# Patient Record
Sex: Female | Born: 1940
Health system: Southern US, Community
[De-identification: ages and names within clinical notes are randomized; demographics above are authoritative.]

## PROBLEM LIST (undated history)

## (undated) DIAGNOSIS — I10 Essential (primary) hypertension: Secondary | ICD-10-CM

## (undated) DIAGNOSIS — R011 Cardiac murmur, unspecified: Secondary | ICD-10-CM

## (undated) DIAGNOSIS — E785 Hyperlipidemia, unspecified: Secondary | ICD-10-CM

## (undated) DIAGNOSIS — I350 Nonrheumatic aortic (valve) stenosis: Secondary | ICD-10-CM

## (undated) DIAGNOSIS — Z972 Presence of dental prosthetic device (complete) (partial): Secondary | ICD-10-CM

## (undated) DIAGNOSIS — T7840XA Allergy, unspecified, initial encounter: Secondary | ICD-10-CM

## (undated) DIAGNOSIS — E079 Disorder of thyroid, unspecified: Secondary | ICD-10-CM

## (undated) DIAGNOSIS — I4891 Unspecified atrial fibrillation: Secondary | ICD-10-CM

## (undated) DIAGNOSIS — L0390 Cellulitis, unspecified: Secondary | ICD-10-CM

## (undated) DIAGNOSIS — E039 Hypothyroidism, unspecified: Secondary | ICD-10-CM

## (undated) HISTORY — DX: Cellulitis, unspecified: L03.90

## (undated) HISTORY — PX: CARDIAC CATHETERIZATION: SHX172

## (undated) HISTORY — DX: Essential (primary) hypertension: I10

## (undated) HISTORY — PX: APPENDECTOMY: SHX54

## (undated) HISTORY — DX: Allergy, unspecified, initial encounter: T78.40XA

## (undated) HISTORY — DX: Hyperlipidemia, unspecified: E78.5

## (undated) HISTORY — PX: FOOT SURGERY: SHX648

## (undated) HISTORY — DX: Disorder of thyroid, unspecified: E07.9

## (undated) HISTORY — PX: TONSILLECTOMY: SHX5217

---
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
## 1997-05-14 HISTORY — PX: BILATERAL CARPAL TUNNEL RELEASE: SHX6508

## 2007-02-19 DIAGNOSIS — E039 Hypothyroidism, unspecified: Secondary | ICD-10-CM | POA: Insufficient documentation

## 2007-02-19 DIAGNOSIS — E785 Hyperlipidemia, unspecified: Secondary | ICD-10-CM | POA: Insufficient documentation

## 2007-02-23 DIAGNOSIS — N318 Other neuromuscular dysfunction of bladder: Secondary | ICD-10-CM | POA: Insufficient documentation

## 2007-02-23 DIAGNOSIS — I10 Essential (primary) hypertension: Secondary | ICD-10-CM | POA: Insufficient documentation

## 2007-02-23 DIAGNOSIS — N951 Menopausal and female climacteric states: Secondary | ICD-10-CM | POA: Insufficient documentation

## 2007-02-23 DIAGNOSIS — L719 Rosacea, unspecified: Secondary | ICD-10-CM | POA: Insufficient documentation

## 2007-02-23 DIAGNOSIS — I340 Nonrheumatic mitral (valve) insufficiency: Secondary | ICD-10-CM | POA: Insufficient documentation

## 2007-02-23 DIAGNOSIS — Z86010 Personal history of colonic polyps: Secondary | ICD-10-CM | POA: Insufficient documentation

## 2007-07-06 DIAGNOSIS — Z8249 Family history of ischemic heart disease and other diseases of the circulatory system: Secondary | ICD-10-CM | POA: Insufficient documentation

## 2007-07-22 ENCOUNTER — Ambulatory Visit: Payer: Self-pay | Admitting: Family Medicine

## 2008-05-04 ENCOUNTER — Ambulatory Visit: Payer: Self-pay | Admitting: Gastroenterology

## 2008-10-19 DIAGNOSIS — N399 Disorder of urinary system, unspecified: Secondary | ICD-10-CM | POA: Insufficient documentation

## 2008-10-26 ENCOUNTER — Ambulatory Visit: Payer: Self-pay | Admitting: Family Medicine

## 2009-04-20 DIAGNOSIS — I6529 Occlusion and stenosis of unspecified carotid artery: Secondary | ICD-10-CM | POA: Insufficient documentation

## 2009-04-25 ENCOUNTER — Ambulatory Visit: Payer: Self-pay | Admitting: Family Medicine

## 2010-06-20 LAB — HM DEXA SCAN: HM Dexa Scan: NORMAL

## 2010-08-01 ENCOUNTER — Ambulatory Visit: Payer: Self-pay | Admitting: Family Medicine

## 2011-10-22 LAB — HM PAP SMEAR: HM Pap smear: NEGATIVE

## 2011-10-30 ENCOUNTER — Ambulatory Visit: Payer: Self-pay | Admitting: Family Medicine

## 2012-12-29 ENCOUNTER — Ambulatory Visit: Payer: Self-pay | Admitting: Family Medicine

## 2013-09-30 DIAGNOSIS — M1710 Unilateral primary osteoarthritis, unspecified knee: Secondary | ICD-10-CM | POA: Insufficient documentation

## 2013-09-30 DIAGNOSIS — M179 Osteoarthritis of knee, unspecified: Secondary | ICD-10-CM | POA: Insufficient documentation

## 2013-10-21 DIAGNOSIS — M7050 Other bursitis of knee, unspecified knee: Secondary | ICD-10-CM | POA: Insufficient documentation

## 2014-04-20 LAB — CBC AND DIFFERENTIAL
HCT: 40 % (ref 36–46)
Hemoglobin: 13.2 g/dL (ref 12.0–16.0)
Platelets: 255 10*3/uL (ref 150–399)
WBC: 7.5 10^3/mL

## 2014-05-18 DIAGNOSIS — M9905 Segmental and somatic dysfunction of pelvic region: Secondary | ICD-10-CM | POA: Diagnosis not present

## 2014-05-18 DIAGNOSIS — M955 Acquired deformity of pelvis: Secondary | ICD-10-CM | POA: Diagnosis not present

## 2014-05-18 DIAGNOSIS — M62830 Muscle spasm of back: Secondary | ICD-10-CM | POA: Diagnosis not present

## 2014-05-18 DIAGNOSIS — M9904 Segmental and somatic dysfunction of sacral region: Secondary | ICD-10-CM | POA: Diagnosis not present

## 2014-06-01 ENCOUNTER — Ambulatory Visit: Payer: Self-pay | Admitting: Gastroenterology

## 2014-06-01 DIAGNOSIS — D122 Benign neoplasm of ascending colon: Secondary | ICD-10-CM | POA: Diagnosis not present

## 2014-06-01 DIAGNOSIS — K5730 Diverticulosis of large intestine without perforation or abscess without bleeding: Secondary | ICD-10-CM | POA: Diagnosis not present

## 2014-06-01 DIAGNOSIS — Z1211 Encounter for screening for malignant neoplasm of colon: Secondary | ICD-10-CM | POA: Diagnosis not present

## 2014-06-01 DIAGNOSIS — Z86010 Personal history of colonic polyps: Secondary | ICD-10-CM | POA: Diagnosis not present

## 2014-06-01 DIAGNOSIS — Z7982 Long term (current) use of aspirin: Secondary | ICD-10-CM | POA: Diagnosis not present

## 2014-06-01 DIAGNOSIS — K648 Other hemorrhoids: Secondary | ICD-10-CM | POA: Diagnosis not present

## 2014-06-01 DIAGNOSIS — E669 Obesity, unspecified: Secondary | ICD-10-CM | POA: Diagnosis not present

## 2014-06-01 DIAGNOSIS — Z880 Allergy status to penicillin: Secondary | ICD-10-CM | POA: Diagnosis not present

## 2014-06-01 DIAGNOSIS — I10 Essential (primary) hypertension: Secondary | ICD-10-CM | POA: Diagnosis not present

## 2014-06-01 DIAGNOSIS — E079 Disorder of thyroid, unspecified: Secondary | ICD-10-CM | POA: Diagnosis not present

## 2014-06-01 LAB — HM COLONOSCOPY

## 2014-06-04 ENCOUNTER — Emergency Department: Payer: Self-pay | Admitting: Internal Medicine

## 2014-06-04 DIAGNOSIS — I10 Essential (primary) hypertension: Secondary | ICD-10-CM | POA: Diagnosis not present

## 2014-06-04 DIAGNOSIS — Z880 Allergy status to penicillin: Secondary | ICD-10-CM | POA: Diagnosis not present

## 2014-06-04 DIAGNOSIS — R918 Other nonspecific abnormal finding of lung field: Secondary | ICD-10-CM | POA: Diagnosis not present

## 2014-06-04 DIAGNOSIS — Z79899 Other long term (current) drug therapy: Secondary | ICD-10-CM | POA: Diagnosis not present

## 2014-06-04 DIAGNOSIS — H9202 Otalgia, left ear: Secondary | ICD-10-CM | POA: Diagnosis not present

## 2014-06-04 DIAGNOSIS — Z7982 Long term (current) use of aspirin: Secondary | ICD-10-CM | POA: Diagnosis not present

## 2014-06-04 DIAGNOSIS — F419 Anxiety disorder, unspecified: Secondary | ICD-10-CM | POA: Diagnosis not present

## 2014-06-04 LAB — URINALYSIS, COMPLETE
Bacteria: NONE SEEN
Bilirubin,UR: NEGATIVE
Glucose,UR: NEGATIVE mg/dL (ref 0–75)
KETONE: NEGATIVE
Leukocyte Esterase: NEGATIVE
NITRITE: NEGATIVE
PH: 5 (ref 4.5–8.0)
PROTEIN: NEGATIVE
Specific Gravity: 1.01 (ref 1.003–1.030)
Squamous Epithelial: 3
WBC UR: 1 /HPF (ref 0–5)

## 2014-06-04 LAB — COMPREHENSIVE METABOLIC PANEL
ANION GAP: 7 (ref 7–16)
AST: 36 U/L (ref 15–37)
Albumin: 3.5 g/dL (ref 3.4–5.0)
Alkaline Phosphatase: 67 U/L
BUN: 16 mg/dL (ref 7–18)
Bilirubin,Total: 0.5 mg/dL (ref 0.2–1.0)
CALCIUM: 9.2 mg/dL (ref 8.5–10.1)
CHLORIDE: 109 mmol/L — AB (ref 98–107)
CO2: 28 mmol/L (ref 21–32)
Creatinine: 1.18 mg/dL (ref 0.60–1.30)
EGFR (Non-African Amer.): 48 — ABNORMAL LOW
GFR CALC AF AMER: 58 — AB
Glucose: 106 mg/dL — ABNORMAL HIGH (ref 65–99)
Osmolality: 288 (ref 275–301)
Potassium: 4 mmol/L (ref 3.5–5.1)
SGPT (ALT): 38 U/L
Sodium: 144 mmol/L (ref 136–145)
Total Protein: 6.8 g/dL (ref 6.4–8.2)

## 2014-06-04 LAB — CBC
HCT: 40.8 % (ref 35.0–47.0)
HGB: 13.4 g/dL (ref 12.0–16.0)
MCH: 31.8 pg (ref 26.0–34.0)
MCHC: 32.9 g/dL (ref 32.0–36.0)
MCV: 97 fL (ref 80–100)
Platelet: 217 10*3/uL (ref 150–440)
RBC: 4.22 10*6/uL (ref 3.80–5.20)
RDW: 13.6 % (ref 11.5–14.5)
WBC: 7.8 10*3/uL (ref 3.6–11.0)

## 2014-06-07 DIAGNOSIS — I10 Essential (primary) hypertension: Secondary | ICD-10-CM | POA: Diagnosis not present

## 2014-06-07 DIAGNOSIS — Z1389 Encounter for screening for other disorder: Secondary | ICD-10-CM | POA: Diagnosis not present

## 2014-06-08 ENCOUNTER — Ambulatory Visit: Payer: Self-pay | Admitting: Family Medicine

## 2014-06-08 DIAGNOSIS — Z1231 Encounter for screening mammogram for malignant neoplasm of breast: Secondary | ICD-10-CM | POA: Diagnosis not present

## 2014-06-08 LAB — HM MAMMOGRAPHY

## 2014-06-10 DIAGNOSIS — M62830 Muscle spasm of back: Secondary | ICD-10-CM | POA: Diagnosis not present

## 2014-06-10 DIAGNOSIS — M955 Acquired deformity of pelvis: Secondary | ICD-10-CM | POA: Diagnosis not present

## 2014-06-10 DIAGNOSIS — M9905 Segmental and somatic dysfunction of pelvic region: Secondary | ICD-10-CM | POA: Diagnosis not present

## 2014-06-10 DIAGNOSIS — M9904 Segmental and somatic dysfunction of sacral region: Secondary | ICD-10-CM | POA: Diagnosis not present

## 2014-06-11 DIAGNOSIS — M62830 Muscle spasm of back: Secondary | ICD-10-CM | POA: Diagnosis not present

## 2014-06-11 DIAGNOSIS — M9905 Segmental and somatic dysfunction of pelvic region: Secondary | ICD-10-CM | POA: Diagnosis not present

## 2014-06-11 DIAGNOSIS — M955 Acquired deformity of pelvis: Secondary | ICD-10-CM | POA: Diagnosis not present

## 2014-06-11 DIAGNOSIS — M9904 Segmental and somatic dysfunction of sacral region: Secondary | ICD-10-CM | POA: Diagnosis not present

## 2014-06-14 DIAGNOSIS — M62830 Muscle spasm of back: Secondary | ICD-10-CM | POA: Diagnosis not present

## 2014-06-14 DIAGNOSIS — M9904 Segmental and somatic dysfunction of sacral region: Secondary | ICD-10-CM | POA: Diagnosis not present

## 2014-06-14 DIAGNOSIS — M955 Acquired deformity of pelvis: Secondary | ICD-10-CM | POA: Diagnosis not present

## 2014-06-14 DIAGNOSIS — M9905 Segmental and somatic dysfunction of pelvic region: Secondary | ICD-10-CM | POA: Diagnosis not present

## 2014-08-04 DIAGNOSIS — M1712 Unilateral primary osteoarthritis, left knee: Secondary | ICD-10-CM | POA: Diagnosis not present

## 2014-09-06 DIAGNOSIS — M62830 Muscle spasm of back: Secondary | ICD-10-CM | POA: Diagnosis not present

## 2014-09-06 DIAGNOSIS — M9905 Segmental and somatic dysfunction of pelvic region: Secondary | ICD-10-CM | POA: Diagnosis not present

## 2014-09-06 DIAGNOSIS — M9904 Segmental and somatic dysfunction of sacral region: Secondary | ICD-10-CM | POA: Diagnosis not present

## 2014-09-06 DIAGNOSIS — M955 Acquired deformity of pelvis: Secondary | ICD-10-CM | POA: Diagnosis not present

## 2014-09-06 LAB — SURGICAL PATHOLOGY

## 2014-12-01 DIAGNOSIS — M545 Low back pain, unspecified: Secondary | ICD-10-CM | POA: Insufficient documentation

## 2014-12-01 DIAGNOSIS — R32 Unspecified urinary incontinence: Secondary | ICD-10-CM | POA: Insufficient documentation

## 2014-12-01 DIAGNOSIS — R87619 Unspecified abnormal cytological findings in specimens from cervix uteri: Secondary | ICD-10-CM | POA: Insufficient documentation

## 2014-12-01 DIAGNOSIS — K1120 Sialoadenitis, unspecified: Secondary | ICD-10-CM | POA: Insufficient documentation

## 2014-12-01 DIAGNOSIS — R591 Generalized enlarged lymph nodes: Secondary | ICD-10-CM | POA: Insufficient documentation

## 2014-12-01 DIAGNOSIS — Z8679 Personal history of other diseases of the circulatory system: Secondary | ICD-10-CM | POA: Insufficient documentation

## 2014-12-01 DIAGNOSIS — R87620 Atypical squamous cells of undetermined significance on cytologic smear of vagina (ASC-US): Secondary | ICD-10-CM

## 2014-12-01 DIAGNOSIS — R599 Enlarged lymph nodes, unspecified: Secondary | ICD-10-CM | POA: Insufficient documentation

## 2014-12-01 HISTORY — DX: Atypical squamous cells of undetermined significance on cytologic smear of vagina (ASC-US): R87.620

## 2014-12-02 ENCOUNTER — Encounter: Payer: Self-pay | Admitting: Family Medicine

## 2014-12-02 ENCOUNTER — Ambulatory Visit (INDEPENDENT_AMBULATORY_CARE_PROVIDER_SITE_OTHER): Payer: Medicare Other | Admitting: Family Medicine

## 2014-12-02 VITALS — BP 138/68 | HR 60 | Temp 97.7°F | Resp 16 | Ht 66.0 in | Wt 276.0 lb

## 2014-12-02 DIAGNOSIS — R002 Palpitations: Secondary | ICD-10-CM | POA: Diagnosis not present

## 2014-12-02 DIAGNOSIS — R5383 Other fatigue: Secondary | ICD-10-CM

## 2014-12-02 DIAGNOSIS — R0602 Shortness of breath: Secondary | ICD-10-CM | POA: Diagnosis not present

## 2014-12-02 NOTE — Progress Notes (Signed)
   Subjective:    Patient ID: Mary Kennedy, female    DOB: 06-19-1940, 74 y.o.   MRN: [id number]  Palpitations  This is a new problem. The current episode started more than 1 month ago (About six months ago, but recently worsening. ). The problem has been gradually worsening. The symptoms are aggravated by unknown. Associated symptoms include nausea, shortness of breath and weakness. Pertinent negatives include no chest pain, coughing, diaphoresis, dizziness, fever, numbness or vomiting. Treatments tried: Rest. The treatment provided moderate relief.  Shortness of Breath The current episode started more than 1 month ago (Started about six months ago. ). The problem has been gradually worsening. Associated symptoms include abdominal pain ("feels uncomfortable" sometimes. ), headaches and neck pain. Pertinent negatives include no chest pain, ear pain, fever, leg swelling, sputum production, vomiting or wheezing. She has tried rest for the symptoms.   Has not felt well since January. Worsening in the heat. Not consistent. Just SOB. Start sweating.  Gets breathless.  Has to rest. Fatigued. Just can not stay up. Off and on. Not consistent. More often.    Review of Systems  Constitutional: Positive for activity change, appetite change and fatigue. Negative for fever, chills, diaphoresis and unexpected weight change.  HENT: Positive for ear discharge and sneezing. Negative for ear pain.   Eyes: Positive for visual disturbance ("Bright circular spot" in her left eye especially at night. ).  Respiratory: Positive for apnea (?) and shortness of breath. Negative for cough, sputum production, choking, chest tightness and wheezing.   Cardiovascular: Positive for palpitations. Negative for chest pain and leg swelling.  Gastrointestinal: Positive for nausea, abdominal pain ("feels uncomfortable" sometimes. ) and constipation (Chronic issue since her colonoscopy in Jan. ). Negative for vomiting, diarrhea,  blood in stool, abdominal distention, anal bleeding and rectal pain.  Musculoskeletal: Positive for arthralgias and neck pain.  Neurological: Positive for weakness, light-headedness and headaches. Negative for dizziness, tremors, seizures, syncope, facial asymmetry, speech difficulty and numbness.  Psychiatric/Behavioral: Positive for sleep disturbance.       Objective:   Physical Exam  Constitutional: She is oriented to person, place, and time. She appears well-developed and well-nourished.  Cardiovascular: Normal rate and regular rhythm.   Pulmonary/Chest: Effort normal and breath sounds normal.  Neurological: She is alert and oriented to person, place, and time.  Psychiatric: She has a normal mood and affect. Her behavior is normal. Judgment and thought content normal.   BP 138/68 mmHg  Pulse 60  Temp(Src) 97.7 F (36.5 C) (Oral)  Resp 16  Ht 5\' 6"  (1.676 m)  Wt 276 lb (125.193 kg)  BMI 44.57 kg/m2       Assessment & Plan:   1. Heart palpitations EKG unchanged today. Will refer to cardiology. ER if has worse event that does not resolve with rest or develops any other symptoms.  - EKG 12-Lead - CBC with Differential/Platelet - Ambulatory referral to Cardiology  2. Shortness of breath Will check labs. Oxygen normal today. Will investigate pulmonary further if cardiac work up norma.  - CBC with Differential/Platelet - B Nat Peptide - Ambulatory referral to Cardiology  3. Other fatigue Will check labs. Consider sleep apnea if rest of work up normal.  - Comprehensive metabolic panel - TSH  Margarita Rana, MD

## 2014-12-03 ENCOUNTER — Telehealth: Payer: Self-pay

## 2014-12-03 LAB — CBC WITH DIFFERENTIAL/PLATELET
BASOS: 1 %
Basophils Absolute: 0.1 10*3/uL (ref 0.0–0.2)
EOS (ABSOLUTE): 0.4 10*3/uL (ref 0.0–0.4)
EOS: 6 %
Hematocrit: 43.2 % (ref 34.0–46.6)
Hemoglobin: 14 g/dL (ref 11.1–15.9)
IMMATURE GRANULOCYTES: 0 %
Immature Grans (Abs): 0 10*3/uL (ref 0.0–0.1)
LYMPHS: 45 %
Lymphocytes Absolute: 3.1 10*3/uL (ref 0.7–3.1)
MCH: 31 pg (ref 26.6–33.0)
MCHC: 32.4 g/dL (ref 31.5–35.7)
MCV: 96 fL (ref 79–97)
Monocytes Absolute: 0.7 10*3/uL (ref 0.1–0.9)
Monocytes: 9 %
Neutrophils Absolute: 2.7 10*3/uL (ref 1.4–7.0)
Neutrophils: 39 %
PLATELETS: 260 10*3/uL (ref 150–379)
RBC: 4.52 x10E6/uL (ref 3.77–5.28)
RDW: 13.9 % (ref 12.3–15.4)
WBC: 7 10*3/uL (ref 3.4–10.8)

## 2014-12-03 LAB — COMPREHENSIVE METABOLIC PANEL
A/G RATIO: 1.5 (ref 1.1–2.5)
ALT: 37 IU/L — ABNORMAL HIGH (ref 0–32)
AST: 34 IU/L (ref 0–40)
Albumin: 4.2 g/dL (ref 3.5–4.8)
Alkaline Phosphatase: 64 IU/L (ref 39–117)
BILIRUBIN TOTAL: 0.5 mg/dL (ref 0.0–1.2)
BUN/Creatinine Ratio: 21 (ref 11–26)
BUN: 21 mg/dL (ref 8–27)
CHLORIDE: 100 mmol/L (ref 97–108)
CO2: 26 mmol/L (ref 18–29)
Calcium: 9.8 mg/dL (ref 8.7–10.3)
Creatinine, Ser: 1.01 mg/dL — ABNORMAL HIGH (ref 0.57–1.00)
GFR calc non Af Amer: 55 mL/min/{1.73_m2} — ABNORMAL LOW (ref >59)
GFR, EST AFRICAN AMERICAN: 64 mL/min/{1.73_m2} (ref >59)
Globulin, Total: 2.8 g/dL (ref 1.5–4.5)
Glucose: 92 mg/dL (ref 65–99)
POTASSIUM: 4.8 mmol/L (ref 3.5–5.2)
SODIUM: 142 mmol/L (ref 134–144)
Total Protein: 7 g/dL (ref 6.0–8.5)

## 2014-12-03 LAB — BRAIN NATRIURETIC PEPTIDE: BNP: 74.2 pg/mL (ref 0.0–100.0)

## 2014-12-03 LAB — TSH: TSH: 2.14 u[IU]/mL (ref 0.450–4.500)

## 2014-12-03 NOTE — Telephone Encounter (Signed)
-----   Message from Margarita Rana, MD sent at 12/03/2014  1:57 PM EDT ----- Labs normal.  Make sure to referral to Cardiology. Thanks.

## 2014-12-03 NOTE — Telephone Encounter (Signed)
LMTCB 12/03/2014  Thanks,   -Mickel Baas

## 2014-12-06 NOTE — Telephone Encounter (Signed)
Pt advised, she says she has an appointment this afternoon with Cardiology.   Thanks,   -Mickel Baas

## 2015-03-04 DIAGNOSIS — Z23 Encounter for immunization: Secondary | ICD-10-CM | POA: Diagnosis not present

## 2015-03-14 DIAGNOSIS — M9905 Segmental and somatic dysfunction of pelvic region: Secondary | ICD-10-CM | POA: Diagnosis not present

## 2015-03-14 DIAGNOSIS — M955 Acquired deformity of pelvis: Secondary | ICD-10-CM | POA: Diagnosis not present

## 2015-03-14 DIAGNOSIS — M62830 Muscle spasm of back: Secondary | ICD-10-CM | POA: Diagnosis not present

## 2015-03-14 DIAGNOSIS — M9904 Segmental and somatic dysfunction of sacral region: Secondary | ICD-10-CM | POA: Diagnosis not present

## 2015-03-17 ENCOUNTER — Other Ambulatory Visit: Payer: Self-pay

## 2015-03-17 DIAGNOSIS — E7800 Pure hypercholesterolemia, unspecified: Secondary | ICD-10-CM

## 2015-03-17 DIAGNOSIS — E039 Hypothyroidism, unspecified: Secondary | ICD-10-CM

## 2015-03-17 MED ORDER — LEVOTHYROXINE SODIUM 100 MCG PO TABS: 100.0000 ug | ORAL_TABLET | Freq: Every day | ORAL | Status: DC

## 2015-03-17 MED ORDER — ATORVASTATIN CALCIUM 20 MG PO TABS: 20.0000 mg | ORAL_TABLET | Freq: Every day | ORAL | Status: DC

## 2015-03-17 NOTE — Telephone Encounter (Signed)
Pharmacy requesting refill. Last OV was 12/02/14. Lipitor las filled in 03/22/2014 #90 with 3 refills. Levothyroxine last filled on 02/11/2014 #90 with 3 refills. Last TSH checked on 12/02/2014 and was 2.1.

## 2015-03-24 ENCOUNTER — Other Ambulatory Visit: Payer: Self-pay

## 2015-03-24 DIAGNOSIS — E039 Hypothyroidism, unspecified: Secondary | ICD-10-CM

## 2015-03-24 MED ORDER — LEVOTHYROXINE SODIUM 100 MCG PO TABS: 100.0000 ug | ORAL_TABLET | Freq: Every day | ORAL | Status: DC

## 2015-03-24 NOTE — Addendum Note (Signed)
Addended by: Ashley Royalty E on: 03/24/2015 04:40 PM   Modules accepted: Orders

## 2015-03-28 ENCOUNTER — Encounter: Payer: Self-pay | Admitting: Family Medicine

## 2015-03-28 DIAGNOSIS — M9905 Segmental and somatic dysfunction of pelvic region: Secondary | ICD-10-CM | POA: Diagnosis not present

## 2015-03-28 DIAGNOSIS — M955 Acquired deformity of pelvis: Secondary | ICD-10-CM | POA: Diagnosis not present

## 2015-03-28 DIAGNOSIS — M62830 Muscle spasm of back: Secondary | ICD-10-CM | POA: Diagnosis not present

## 2015-03-28 DIAGNOSIS — M9904 Segmental and somatic dysfunction of sacral region: Secondary | ICD-10-CM | POA: Diagnosis not present

## 2015-04-08 ENCOUNTER — Telehealth: Payer: Self-pay | Admitting: Family Medicine

## 2015-04-08 NOTE — Telephone Encounter (Signed)
Pt called and said she has joined [employer]... Pt has already lost 10 lbs.  She needs a note to [facility] stating her goal which is 200lb. Weight.. Checks blood pressure daily.  She needs this to be in the program.  Her call back is [phone]  Thanks Con Memos

## 2015-04-11 DIAGNOSIS — M62830 Muscle spasm of back: Secondary | ICD-10-CM | POA: Diagnosis not present

## 2015-04-11 DIAGNOSIS — M9904 Segmental and somatic dysfunction of sacral region: Secondary | ICD-10-CM | POA: Diagnosis not present

## 2015-04-11 DIAGNOSIS — M9905 Segmental and somatic dysfunction of pelvic region: Secondary | ICD-10-CM | POA: Diagnosis not present

## 2015-04-11 DIAGNOSIS — M955 Acquired deformity of pelvis: Secondary | ICD-10-CM | POA: Diagnosis not present

## 2015-04-11 NOTE — Telephone Encounter (Signed)
Needs OV. Thanks.  Not seen since January. Can write not at ov. Thanks.

## 2015-04-11 NOTE — Telephone Encounter (Signed)
Left message to call back  

## 2015-04-12 NOTE — Telephone Encounter (Signed)
Appointment scheduled for 04/25/2015@8 :30/MW

## 2015-04-25 ENCOUNTER — Other Ambulatory Visit: Payer: Self-pay

## 2015-04-25 ENCOUNTER — Ambulatory Visit (INDEPENDENT_AMBULATORY_CARE_PROVIDER_SITE_OTHER): Payer: Medicare Other | Admitting: Family Medicine

## 2015-04-25 ENCOUNTER — Encounter: Payer: Self-pay | Admitting: Family Medicine

## 2015-04-25 VITALS — BP 138/60 | HR 34 | Temp 97.6°F | Resp 16 | Ht 65.25 in | Wt 269.0 lb

## 2015-04-25 DIAGNOSIS — I10 Essential (primary) hypertension: Secondary | ICD-10-CM

## 2015-04-25 DIAGNOSIS — R001 Bradycardia, unspecified: Secondary | ICD-10-CM

## 2015-04-25 DIAGNOSIS — M9904 Segmental and somatic dysfunction of sacral region: Secondary | ICD-10-CM | POA: Diagnosis not present

## 2015-04-25 DIAGNOSIS — E669 Obesity, unspecified: Secondary | ICD-10-CM | POA: Diagnosis not present

## 2015-04-25 DIAGNOSIS — M62830 Muscle spasm of back: Secondary | ICD-10-CM | POA: Diagnosis not present

## 2015-04-25 DIAGNOSIS — E785 Hyperlipidemia, unspecified: Secondary | ICD-10-CM | POA: Diagnosis not present

## 2015-04-25 DIAGNOSIS — M955 Acquired deformity of pelvis: Secondary | ICD-10-CM | POA: Diagnosis not present

## 2015-04-25 DIAGNOSIS — M9905 Segmental and somatic dysfunction of pelvic region: Secondary | ICD-10-CM | POA: Diagnosis not present

## 2015-04-25 DIAGNOSIS — R0602 Shortness of breath: Secondary | ICD-10-CM | POA: Diagnosis not present

## 2015-04-25 DIAGNOSIS — R5383 Other fatigue: Secondary | ICD-10-CM | POA: Diagnosis not present

## 2015-04-25 DIAGNOSIS — R011 Cardiac murmur, unspecified: Secondary | ICD-10-CM | POA: Diagnosis not present
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
## 2015-05-02 DIAGNOSIS — R001 Bradycardia, unspecified: Secondary | ICD-10-CM | POA: Diagnosis not present

## 2015-05-11 ENCOUNTER — Other Ambulatory Visit: Payer: Self-pay | Admitting: Family Medicine

## 2015-05-11 DIAGNOSIS — E785 Hyperlipidemia, unspecified: Secondary | ICD-10-CM

## 2015-05-12 NOTE — Telephone Encounter (Signed)
Has CPE scheduled 06/23/2015.

## 2015-05-18 DIAGNOSIS — M9904 Segmental and somatic dysfunction of sacral region: Secondary | ICD-10-CM | POA: Diagnosis not present

## 2015-05-18 DIAGNOSIS — M955 Acquired deformity of pelvis: Secondary | ICD-10-CM | POA: Diagnosis not present

## 2015-05-18 DIAGNOSIS — M9905 Segmental and somatic dysfunction of pelvic region: Secondary | ICD-10-CM | POA: Diagnosis not present

## 2015-05-18 DIAGNOSIS — M62830 Muscle spasm of back: Secondary | ICD-10-CM | POA: Diagnosis not present

## 2015-06-10 ENCOUNTER — Encounter: Payer: Medicare Other | Admitting: Family Medicine

## 2015-06-23 ENCOUNTER — Ambulatory Visit (INDEPENDENT_AMBULATORY_CARE_PROVIDER_SITE_OTHER): Payer: Medicare Other | Admitting: Family Medicine

## 2015-06-23 ENCOUNTER — Encounter: Payer: Self-pay | Admitting: Family Medicine

## 2015-06-23 VITALS — BP 118/60 | HR 56 | Temp 98.1°F | Resp 16 | Ht 65.25 in | Wt 262.0 lb

## 2015-06-23 DIAGNOSIS — E785 Hyperlipidemia, unspecified: Secondary | ICD-10-CM

## 2015-06-23 DIAGNOSIS — E038 Other specified hypothyroidism: Secondary | ICD-10-CM | POA: Diagnosis not present

## 2015-06-23 DIAGNOSIS — Z0000 Encounter for general adult medical examination without abnormal findings: Secondary | ICD-10-CM

## 2015-06-23 DIAGNOSIS — Z1239 Encounter for other screening for malignant neoplasm of breast: Secondary | ICD-10-CM | POA: Diagnosis not present

## 2015-06-23 NOTE — Progress Notes (Signed)
Patient ID: Mary Kennedy, female   DOB: 09/10/1940, 75 y.o.   MRN: [id number]       [name], Female    DOB: Jun 06, 1940, 75 y.o.   MRN: [id number] Visit Date: 06/23/2015  Today's Provider: Margarita Rana, MD   Chief Complaint  Patient presents with  . Medicare Wellness   Subjective:    Annual wellness visit Mary Kennedy is a 75 y.o. female. She feels well. She reports exercising daily. She reports she is sleeping well. 03/22/14 CPE 10/22/11 Pap-neg 06/08/14 Mammo-BI-RADS 1 06/01/14 Colon-polyp, int hemorrhoids, diverticulosis, recheck in 3 yrs 06/20/10 BMD-normal Patient reports that she had elevated BP with colonoscopy prep. Patient reports that she is not willing to have colonoscopy done in 3 years as advised by GI. Patient reports she would like to try Cologuard at that time. -----------------------------------------------------------   Review of Systems  Constitutional: Positive for chills and activity change.  HENT: Negative.   Eyes: Negative.   Respiratory: Negative.   Cardiovascular: Negative.   Gastrointestinal: Negative.   Endocrine: Positive for polyuria.  Musculoskeletal: Negative.   Skin: Negative.   Allergic/Immunologic: Negative.   Neurological: Negative.   Hematological: Negative.   Psychiatric/Behavioral: Negative.     Social History   Social History  . Marital Status: Divorced    Spouse Name: N/A  . Number of Children: 2  . Years of Education: N/A   Occupational History  . Retired    Social History Main Topics  . Smoking status: Former Smoker    Quit date: 05/13/1996  . Smokeless tobacco: Never Used  . Alcohol Use: Yes     Comment: socialy  . Drug Use: No  . Sexual Activity: Not on file   Other Topics Concern  . Not on file   Social History Narrative    Past Medical History  Diagnosis Date  . Thyroid disease   . Hyperlipidemia   . Hypertension      Patient Active Problem List   Diagnosis Date Noted    . HLD (hyperlipidemia) 06/23/2015  . Bradycardia 04/25/2015  . Abnormal cervical Papanicolaou smear 12/01/2014  . H/O cardiovascular disorder 12/01/2014  . LBP (low back pain) 12/01/2014  . Adenopathy 12/01/2014  . Atypical squamous cells of undetermined significance on cytologic smear of vagina (asc-us) 12/01/2014  . Adenitis, salivary, recurring 12/01/2014  . Absence of bladder continence 12/01/2014  . Bursitis of knee 10/21/2013  . Arthritis of knee, degenerative 09/30/2013  . Carotid artery obstruction 04/20/2009  . Urinary system disease 10/19/2008  . Obesity, Class III, BMI 40-49.9 (morbid obesity) ([place]) 10/19/2008  . Family history of cardiovascular disease 07/06/2007  . Undiagnosed cardiac murmurs 02/23/2007  . History of colon polyps 02/23/2007  . Benign hypertension 02/23/2007  . Bladder compliance low 02/23/2007  . Acne erythematosa 02/23/2007  . Menopausal symptom 02/23/2007  . Hyperlipidemia 02/19/2007  . Adult hypothyroidism 02/19/2007    Past Surgical History  Procedure Laterality Date  . Abdominal hysterectomy  1961    BIL Oophorectomy  . Appendectomy  at age 48  . Tonsillectomy  at age 89  . Bilateral carpal tunnel release  1999  . Foot surgery Bilateral   . Cardiac catheterization      Her family history includes Drug abuse in her brother; Heart attack in her father; Lung cancer in her brother; Pancreatitis in her mother.    Previous Medications   ALPHA LIPOIC ACID 200 MG CAPS    Take 1 capsule by mouth daily.  ASCORBIC ACID (VITAMIN C) 1000 MG TABLET    Take 1 tablet by mouth daily.   ASPIRIN 81 MG TABLET    Take 1 tablet by mouth daily.   ATORVASTATIN (LIPITOR) 20 MG TABLET    TAKE ONE TABLET BY MOUTH AT BEDTIME FOR CHOLESTEROL   B COMPLEX CAPS    Take 1 capsule by mouth daily.   CALCIUM CARBONATE-VITAMIN D 600-200 MG-UNIT TABS    Take 2 tablets by mouth daily.   CHOLECALCIFEROL (VITAMIN D3) 1000 UNITS CAPS    Take by mouth.   CHROMIUM PICOLINATE  1000 MCG TABS    Take 1 tablet by mouth daily.   COENZYME Q10 100 MG TABS    Take 1 capsule by mouth daily.   HYDROCHLOROTHIAZIDE (HYDRODIURIL) 12.5 MG TABLET    Take 6.25 mg by mouth daily.    LEVOTHYROXINE (SYNTHROID, LEVOTHROID) 100 MCG TABLET    Take 1 tablet (100 mcg total) by mouth daily.   LISINOPRIL (PRINIVIL,ZESTRIL) 20 MG TABLET    Take 40 mg by mouth daily.    MAGNESIUM OXIDE (MAG-OX) 400 MG TABLET    Take 1 tablet by mouth daily.   MULTIPLE VITAMIN PO    Take 1 tablet by mouth daily.   OMEGA 3-LUTEIN-ZEAXANTHIN (ADVANCED EYE HEALTH) 250-2.5-0.5 MG CAPS    Take 1 capsule by mouth daily.   OMEGA-3 FATTY ACIDS (FISH OIL EXTRA STRENGTH) 1200 MG CAPS    Take 1 capsule by mouth daily.   TRIAMCINOLONE CREAM (KENALOG) 0.1 %    Apply 1 application topically 2 (two) times daily.   TURMERIC (RA TURMERIC) 500 MG CAPS    Take by mouth.    Patient Care Team: Margarita Rana, MD as PCP - General (Family Medicine)     Objective:   Vitals: BP 118/60 mmHg  Pulse 56  Temp(Src) 98.1 F (36.7 C) (Oral)  Resp 16  Ht 5' 5.25" (1.657 m)  Wt 262 lb (118.842 kg)  BMI 43.28 kg/m2  SpO2 97%  Physical Exam  Constitutional: She is oriented to person, place, and time. She appears well-developed and well-nourished.  HENT:  Head: Normocephalic and atraumatic.  Right Ear: Tympanic membrane, external ear and ear canal normal.  Left Ear: Tympanic membrane, external ear and ear canal normal.  Nose: Nose normal.  Mouth/Throat: Uvula is midline, oropharynx is clear and moist and mucous membranes are normal.  Eyes: Conjunctivae, EOM and lids are normal. Pupils are equal, round, and reactive to light.  Neck: Trachea normal and normal range of motion. Neck supple. Carotid bruit is not present. No thyroid mass and no thyromegaly present.  Cardiovascular: Normal rate, regular rhythm and normal heart sounds.   Pulmonary/Chest: Effort normal and breath sounds normal.  Abdominal: Soft. Normal appearance and  bowel sounds are normal. There is no hepatosplenomegaly. There is no tenderness.  Genitourinary: No breast swelling, tenderness or discharge.  Musculoskeletal: Normal range of motion.  Lymphadenopathy:    She has no cervical adenopathy.    She has no axillary adenopathy.  Neurological: She is alert and oriented to person, place, and time. She has normal strength. No cranial nerve deficit.  Skin: Skin is warm, dry and intact.  Psychiatric: She has a normal mood and affect. Her speech is normal and behavior is normal. Judgment and thought content normal. Cognition and memory are normal.    Activities of Daily Living In your present state of health, do you have any difficulty performing the following activities: 06/23/2015  Hearing? N  Vision? N  Difficulty concentrating or making decisions? N  Walking or climbing stairs? Y  Dressing or bathing? N  Doing errands, shopping? N    Fall Risk Assessment Fall Risk  06/23/2015  Falls in the past year? No     Depression Screen PHQ 2/9 Scores 06/23/2015  PHQ - 2 Score 0    Cognitive Testing - 6-CIT  Correct? Score   What year is it? yes 0 0 or 4  What month is it? yes 0 0 or 3  Memorize:    Pia Mau,  [birth year],  [place] [address]  [place],      What time is it? (within 1 hour) yes 0 0 or 3  Count backwards from 20 yes 0 0, 2, or 4  Name the months of the year yes 0 0, 2, or 4  Repeat name & address above yes 1 0, 2, 4, 6, 8, or 10       TOTAL SCORE  1/28   Interpretation:  Normal  Normal (0-7) Abnormal (8-28)       Assessment & Plan:     Annual Wellness Visit  Reviewed patient's Family Medical History Reviewed and updated list of patient's medical providers Assessment of cognitive impairment was done Assessed patient's functional ability Established a written schedule for health screening [place] Completed and Reviewed  Exercise Activities and Dietary recommendations Goals    None      Immunization  History  Administered Date(s) Administered  . Influenza-Unspecified 03/04/2015  . Pneumococcal Conjugate-13 03/22/2014  . Pneumococcal Polysaccharide-23 11/03/2012  . Tdap 06/20/2010  . Zoster 06/20/2010      1. Medicare annual wellness visit, subsequent Stable. Patient advised to continue eating healthy and exercise daily.  2. Breast cancer screening - MM DIGITAL SCREENING BILATERAL; Future  3. Hyperlipidemia - Lipid Panel With LDL/HDL Ratio - Comprehensive metabolic panel  4. Other specified hypothyroidism - TSH   Patient seen and examined by Dr. Jerrell Belfast, and note scribed by Philbert Riser. Dimas, CMA.  I have reviewed the document for accuracy and completeness and I agree with above. Jerrell Belfast, MD   Margarita Rana, MD    ------------------------------------------------------------------------------------------------------------

## 2015-06-23 NOTE — Patient Instructions (Signed)
Please call the [facility] at [facility] to schedule this at [phone]   

## 2015-06-27 DIAGNOSIS — E785 Hyperlipidemia, unspecified: Secondary | ICD-10-CM | POA: Diagnosis not present

## 2015-06-27 DIAGNOSIS — E038 Other specified hypothyroidism: Secondary | ICD-10-CM | POA: Diagnosis not present

## 2015-06-28 ENCOUNTER — Telehealth: Payer: Self-pay

## 2015-06-28 LAB — TSH: TSH: 1.47 u[IU]/mL (ref 0.450–4.500)

## 2015-06-28 LAB — COMPREHENSIVE METABOLIC PANEL
ALBUMIN: 4.1 g/dL (ref 3.5–4.8)
ALT: 28 IU/L (ref 0–32)
AST: 30 IU/L (ref 0–40)
Albumin/Globulin Ratio: 1.6 (ref 1.1–2.5)
Alkaline Phosphatase: 55 IU/L (ref 39–117)
BUN / CREAT RATIO: 19 (ref 11–26)
BUN: 22 mg/dL (ref 8–27)
Bilirubin Total: 0.3 mg/dL (ref 0.0–1.2)
CALCIUM: 9.7 mg/dL (ref 8.7–10.3)
CO2: 27 mmol/L (ref 18–29)
Chloride: 103 mmol/L (ref 96–106)
Creatinine, Ser: 1.17 mg/dL — ABNORMAL HIGH (ref 0.57–1.00)
GFR, EST AFRICAN AMERICAN: 53 mL/min/{1.73_m2} — AB (ref >59)
GFR, EST NON AFRICAN AMERICAN: 46 mL/min/{1.73_m2} — AB (ref >59)
Globulin, Total: 2.5 g/dL (ref 1.5–4.5)
Glucose: 99 mg/dL (ref 65–99)
Potassium: 4.8 mmol/L (ref 3.5–5.2)
Sodium: 142 mmol/L (ref 134–144)
Total Protein: 6.6 g/dL (ref 6.0–8.5)

## 2015-06-28 LAB — LIPID PANEL WITH LDL/HDL RATIO
Cholesterol, Total: 134 mg/dL (ref 100–199)
HDL: 47 mg/dL (ref >39)
LDL CALC: 74 mg/dL (ref 0–99)
LDL/HDL RATIO: 1.6 ratio (ref 0.0–3.2)
TRIGLYCERIDES: 63 mg/dL (ref 0–149)
VLDL CHOLESTEROL CAL: 13 mg/dL (ref 5–40)

## 2015-06-28 NOTE — Telephone Encounter (Signed)
-----   Message from Margarita Rana, MD sent at 06/28/2015  6:58 AM EST ----- Labs stable. Please notify patient. Thanks.

## 2015-06-28 NOTE — Telephone Encounter (Signed)
LMTCB 06/28/2015  Thanks,   -Mickel Baas

## 2015-06-28 NOTE — Telephone Encounter (Signed)
Advised pt of lab results. Pt verbally acknowledges understanding. Emily Drozdowski, CMA   

## 2015-07-01 ENCOUNTER — Ambulatory Visit: Payer: Self-pay | Attending: Family Medicine

## 2015-08-15 DIAGNOSIS — M955 Acquired deformity of pelvis: Secondary | ICD-10-CM | POA: Diagnosis not present

## 2015-08-15 DIAGNOSIS — M62830 Muscle spasm of back: Secondary | ICD-10-CM | POA: Diagnosis not present

## 2015-08-15 DIAGNOSIS — M9905 Segmental and somatic dysfunction of pelvic region: Secondary | ICD-10-CM | POA: Diagnosis not present

## 2015-08-15 DIAGNOSIS — M9904 Segmental and somatic dysfunction of sacral region: Secondary | ICD-10-CM | POA: Diagnosis not present

## 2015-08-24 DIAGNOSIS — M955 Acquired deformity of pelvis: Secondary | ICD-10-CM | POA: Diagnosis not present

## 2015-08-24 DIAGNOSIS — M9905 Segmental and somatic dysfunction of pelvic region: Secondary | ICD-10-CM | POA: Diagnosis not present

## 2015-08-24 DIAGNOSIS — M9904 Segmental and somatic dysfunction of sacral region: Secondary | ICD-10-CM | POA: Diagnosis not present

## 2015-08-24 DIAGNOSIS — M62830 Muscle spasm of back: Secondary | ICD-10-CM | POA: Diagnosis not present

## 2015-08-30 DIAGNOSIS — M955 Acquired deformity of pelvis: Secondary | ICD-10-CM | POA: Diagnosis not present

## 2015-08-30 DIAGNOSIS — M9905 Segmental and somatic dysfunction of pelvic region: Secondary | ICD-10-CM | POA: Diagnosis not present

## 2015-08-30 DIAGNOSIS — M62830 Muscle spasm of back: Secondary | ICD-10-CM | POA: Diagnosis not present

## 2015-08-30 DIAGNOSIS — M9904 Segmental and somatic dysfunction of sacral region: Secondary | ICD-10-CM | POA: Diagnosis not present

## 2015-09-05 ENCOUNTER — Other Ambulatory Visit: Payer: Self-pay | Admitting: Family Medicine

## 2015-09-05 DIAGNOSIS — E038 Other specified hypothyroidism: Secondary | ICD-10-CM

## 2015-09-07 DIAGNOSIS — M62830 Muscle spasm of back: Secondary | ICD-10-CM | POA: Diagnosis not present

## 2015-09-07 DIAGNOSIS — M9904 Segmental and somatic dysfunction of sacral region: Secondary | ICD-10-CM | POA: Diagnosis not present

## 2015-09-07 DIAGNOSIS — M9905 Segmental and somatic dysfunction of pelvic region: Secondary | ICD-10-CM | POA: Diagnosis not present

## 2015-09-07 DIAGNOSIS — M955 Acquired deformity of pelvis: Secondary | ICD-10-CM | POA: Diagnosis not present

## 2015-09-15 DIAGNOSIS — M62830 Muscle spasm of back: Secondary | ICD-10-CM | POA: Diagnosis not present

## 2015-09-15 DIAGNOSIS — M9905 Segmental and somatic dysfunction of pelvic region: Secondary | ICD-10-CM | POA: Diagnosis not present

## 2015-09-15 DIAGNOSIS — M9904 Segmental and somatic dysfunction of sacral region: Secondary | ICD-10-CM | POA: Diagnosis not present

## 2015-09-15 DIAGNOSIS — M955 Acquired deformity of pelvis: Secondary | ICD-10-CM | POA: Diagnosis not present

## 2015-10-11 DIAGNOSIS — M955 Acquired deformity of pelvis: Secondary | ICD-10-CM | POA: Diagnosis not present

## 2015-10-11 DIAGNOSIS — M9904 Segmental and somatic dysfunction of sacral region: Secondary | ICD-10-CM | POA: Diagnosis not present

## 2015-10-11 DIAGNOSIS — M9905 Segmental and somatic dysfunction of pelvic region: Secondary | ICD-10-CM | POA: Diagnosis not present

## 2015-10-11 DIAGNOSIS — M62830 Muscle spasm of back: Secondary | ICD-10-CM | POA: Diagnosis not present

## 2015-10-27 DIAGNOSIS — R001 Bradycardia, unspecified: Secondary | ICD-10-CM | POA: Diagnosis not present

## 2015-10-27 DIAGNOSIS — R0602 Shortness of breath: Secondary | ICD-10-CM | POA: Diagnosis not present

## 2015-10-27 DIAGNOSIS — E785 Hyperlipidemia, unspecified: Secondary | ICD-10-CM | POA: Diagnosis not present

## 2015-10-27 DIAGNOSIS — R5383 Other fatigue: Secondary | ICD-10-CM | POA: Diagnosis not present

## 2015-12-26 ENCOUNTER — Ambulatory Visit (INDEPENDENT_AMBULATORY_CARE_PROVIDER_SITE_OTHER): Payer: Medicare Other | Admitting: Physician Assistant

## 2015-12-26 ENCOUNTER — Encounter: Payer: Self-pay | Admitting: Physician Assistant

## 2015-12-26 VITALS — BP 110/58 | HR 62 | Temp 98.1°F | Resp 16 | Wt 248.4 lb

## 2015-12-26 DIAGNOSIS — I952 Hypotension due to drugs: Secondary | ICD-10-CM

## 2015-12-26 DIAGNOSIS — R001 Bradycardia, unspecified: Secondary | ICD-10-CM

## 2015-12-26 NOTE — Progress Notes (Signed)
[name] Female    DOB: 24-Dec-1940   75 y.o.   MRN: [id number] Visit Date: 12/26/2015  Today's Provider: Mar Daring, PA-C   Chief Complaint  Patient presents with  . bood Pressure Problem   Subjective:    HPI Hypotension: Patient reports that her blood pressure reading have been reading low. She reports that her bp usually run in 140's and 80's. She reports that she was feeling lethargic. No dizziness or lightheadedness, chest pain or leg swelling. She reports that she is exercising and lost 45 lbs for the past year. She reports that the she feels that Lisinopril 40 mg is too high for her since she lost weight.  Home BP readings Sunday :  93/45 P 58 98/49 P 53 128/55 P 55  Today: 122/52 P 58.     Allergies  Allergen Reactions  . Ciprofloxacin   . Tegaderm Ag Mesh 2"X2"  [Wound Dressings]     when removing the patch her skin came off.  . Penicillins Rash   Current Meds  Medication Sig  . aspirin 81 MG tablet Take 1 tablet by mouth daily.  Marland Kitchen atorvastatin (LIPITOR) 20 MG tablet TAKE ONE TABLET BY MOUTH AT BEDTIME FOR CHOLESTEROL  . B Complex CAPS Take 1 capsule by mouth daily.  . Calcium Carbonate-Vitamin D 600-200 MG-UNIT TABS Take 2 tablets by mouth daily.  . Cholecalciferol (VITAMIN D3) 1000 UNITS CAPS Take by mouth.  . Chromium Picolinate 1000 MCG TABS Take 1 tablet by mouth daily.  . Coenzyme Q10 100 MG TABS Take 1 capsule by mouth daily.  . hydrochlorothiazide (HYDRODIURIL) 12.5 MG tablet Take 6.25 mg by mouth daily.   Marland Kitchen levothyroxine (SYNTHROID, LEVOTHROID) 100 MCG tablet Take 1 tablet by mouth  daily  . lisinopril (PRINIVIL,ZESTRIL) 20 MG tablet Take 40 mg by mouth daily.   . magnesium oxide (MAG-OX) 400 MG tablet Take 1 tablet by mouth daily.  . MULTIPLE VITAMIN PO Take 1 tablet by mouth daily.  Ernestine Conrad 3-Lutein-Zeaxanthin (ADVANCED EYE HEALTH) 250-2.5-0.5 MG CAPS Take 1 capsule by mouth daily.  . Omega-3 Fatty Acids (FISH OIL  EXTRA STRENGTH) 1200 MG CAPS Take 1 capsule by mouth daily.  Marland Kitchen triamcinolone cream (KENALOG) 0.1 % Apply 1 application topically 2 (two) times daily.    Review of Systems  Constitutional: Positive for fatigue. Negative for appetite change, chills, diaphoresis and fever.  HENT: Negative.   Respiratory: Negative for cough, chest tightness and shortness of breath.   Cardiovascular: Negative for chest pain, palpitations and leg swelling.  Gastrointestinal: Negative for abdominal pain.  Neurological: Negative for dizziness, light-headedness and headaches.    Social History  Substance Use Topics  . Smoking status: Former Smoker    Quit date: 05/13/1996  . Smokeless tobacco: Never Used  . Alcohol use Yes     Comment: socialy   Objective:   BP (!) 110/58 (BP Location: Right Arm, Patient Position: Sitting, Cuff Size: Large)   Pulse 62   Temp 98.1 F (36.7 C) (Oral)   Resp 16   Wt 248 lb 6.4 oz (112.7 kg)   BMI 41.02 kg/m   Physical Exam  Constitutional: She appears well-developed and well-nourished. No distress.  Neck: Normal range of motion. Neck supple. No tracheal deviation present. No thyromegaly present.  Cardiovascular: Regular rhythm and normal heart sounds.  Bradycardia present.  Exam reveals no gallop and no friction rub.   No murmur heard. Pulmonary/Chest: Effort normal and  breath sounds normal. No respiratory distress. She has no wheezes. She has no rales.  Musculoskeletal: She exhibits no edema.  Lymphadenopathy:    She has no cervical adenopathy.  Skin: She is not diaphoretic.  Psychiatric: She has a normal mood and affect. Her behavior is normal. Judgment and thought content normal.  Vitals reviewed.     Assessment & Plan:     1. Hypotension due to drugs Will decrease lisinopril to 20mg  daily. She is to continue HCTZ 6.25mg  for now as well. Conitnue lifestyle modifications. She is going to keep a BP log. I will see her back in 4 weeks to recheck. She is to call if  BP continues to run low in the meantime.   2. Bradycardia Followed by Dr. Clayborn Bigness.       Mar Daring, PA-C  [facility]

## 2015-12-26 NOTE — Patient Instructions (Signed)
Hypotension  As your heart beats, it forces blood through your arteries. This force is your blood pressure. If your blood pressure is too low for you to go about your normal activities or to support the organs of your body, you have hypotension. Hypotension is also referred to as low blood pressure. When your blood pressure becomes too low, you may not get enough blood to your brain. As a result, you may feel weak, feel lightheaded, or develop a rapid heart rate. In a more severe case, you may faint.  CAUSES  Various conditions can cause hypotension. These include:  · Blood loss.  · Dehydration.  · Heart or endocrine problems.  · Pregnancy.  · Severe infection.  · Not having a well-balanced diet filled with needed nutrients.  · Severe allergic reactions (anaphylaxis).  Some medicines, such as blood pressure medicine or water pills (diuretics), may lower your blood pressure below normal. Sometimes taking too much medicine or taking medicine not as directed can cause hypotension.  TREATMENT   Hospitalization is sometimes required for hypotension if fluid or blood replacement is needed, if time is needed for medicines to wear off, or if further monitoring is needed. Treatment might include changing your diet, changing your medicines (including medicines aimed at raising your blood pressure), and use of support stockings.  HOME CARE INSTRUCTIONS   · Drink enough fluids to keep your urine clear or pale yellow.  · Take your medicines as directed by your health care provider.  · Get up slowly from reclining or sitting positions. This gives your blood pressure a chance to adjust.  · Wear support stockings as directed by your health care provider.  · Maintain a healthy diet by including nutritious food, such as fruits, vegetables, nuts, whole grains, and lean meats.  SEEK MEDICAL CARE IF:  · You have vomiting or diarrhea.  · You have a fever for more than 2-3 days.  · You feel more thirsty than usual.  · You feel weak and  tired.  SEEK IMMEDIATE MEDICAL CARE IF:   · You have chest pain or a fast or irregular heartbeat.  · You have a loss of feeling in some part of your body, or you lose movement in your arms or legs.  · You have trouble speaking.  · You become sweaty or feel lightheaded.  · You faint.  MAKE SURE YOU:   · Understand these instructions.  · Will watch your condition.  · Will get help right away if you are not doing well or get worse.     This information is not intended to replace advice given to you by your health care provider. Make sure you discuss any questions you have with your health care provider.     Document Released: 04/30/2005 Document Revised: 02/18/2013 Document Reviewed: 10/31/2012  Elsevier Interactive Patient Education ©2016 [employer]

## 2016-01-24 ENCOUNTER — Encounter: Payer: Self-pay | Admitting: Physician Assistant

## 2016-01-24 ENCOUNTER — Ambulatory Visit (INDEPENDENT_AMBULATORY_CARE_PROVIDER_SITE_OTHER): Payer: Medicare Other | Admitting: Physician Assistant

## 2016-01-24 VITALS — BP 128/68 | HR 52 | Temp 98.6°F | Resp 16 | Wt 249.0 lb

## 2016-01-24 DIAGNOSIS — E038 Other specified hypothyroidism: Secondary | ICD-10-CM

## 2016-01-24 DIAGNOSIS — I10 Essential (primary) hypertension: Secondary | ICD-10-CM

## 2016-01-24 DIAGNOSIS — E785 Hyperlipidemia, unspecified: Secondary | ICD-10-CM

## 2016-01-24 MED ORDER — ATORVASTATIN CALCIUM 20 MG PO TABS: ORAL_TABLET | ORAL | 3 refills | Status: DC

## 2016-01-24 MED ORDER — LISINOPRIL 20 MG PO TABS: 20.0000 mg | ORAL_TABLET | Freq: Every day | ORAL | 3 refills | Status: DC

## 2016-01-24 MED ORDER — LEVOTHYROXINE SODIUM 100 MCG PO TABS: 100.0000 ug | ORAL_TABLET | Freq: Every day | ORAL | 3 refills | Status: DC

## 2016-01-24 MED ORDER — MAGNESIUM 250 MG PO TABS: 1.0000 | ORAL_TABLET | Freq: Every day | ORAL | 0 refills | Status: DC

## 2016-01-24 MED ORDER — HYDROCHLOROTHIAZIDE 12.5 MG PO TABS: 6.2500 mg | ORAL_TABLET | Freq: Every day | ORAL | 3 refills | Status: DC

## 2016-01-24 MED ORDER — TRIAMCINOLONE ACETONIDE 0.1 % EX CREA: 1.0000 "application " | TOPICAL_CREAM | Freq: Two times a day (BID) | CUTANEOUS | 1 refills | Status: DC

## 2016-01-24 NOTE — Progress Notes (Signed)
[name] Female    DOB: 24-Jul-1940   75 y.o.   MRN: [id number] Visit Date: 01/24/2016  Today's Provider: Mar Daring, PA-C   Chief Complaint  Patient presents with  . Hypotension    4 week follow up   Subjective:    HPI Hypotension Patient comes in today for a 4 week follow up. On last visit, patient's BP medication (Lisinopril) was decreased to 20mg  daily due to symptomatic hypotension. Patient reports that prior to medication change, she felt weak and lethargic. Patient reports that she feels much better since her medication was changed.      Allergies  Allergen Reactions  . Ciprofloxacin   . Tegaderm Ag Mesh 2"X2"  [Wound Dressings]     when removing the patch her skin came off.  . Penicillins Rash     Current Outpatient Prescriptions:  .  Alpha Lipoic Acid 200 MG CAPS, Take 1 capsule by mouth daily., Disp: , Rfl:  .  Ascorbic Acid (VITAMIN C) 1000 MG tablet, Take 1 tablet by mouth daily., Disp: , Rfl:  .  aspirin 81 MG tablet, Take 1 tablet by mouth daily., Disp: , Rfl:  .  atorvastatin (LIPITOR) 20 MG tablet, TAKE ONE TABLET BY MOUTH AT BEDTIME FOR CHOLESTEROL, Disp: 90 tablet, Rfl: 3 .  B Complex CAPS, Take 1 capsule by mouth daily., Disp: , Rfl:  .  Calcium Carbonate-Vitamin D 600-200 MG-UNIT TABS, Take 2 tablets by mouth daily., Disp: , Rfl:  .  Cholecalciferol (VITAMIN D3) 1000 UNITS CAPS, Take by mouth., Disp: , Rfl:  .  Chromium Picolinate 1000 MCG TABS, Take 1 tablet by mouth daily., Disp: , Rfl:  .  Coenzyme Q10 100 MG TABS, Take 1 capsule by mouth daily., Disp: , Rfl:  .  hydrochlorothiazide (HYDRODIURIL) 12.5 MG tablet, Take 6.25 mg by mouth daily. , Disp: , Rfl:  .  levothyroxine (SYNTHROID, LEVOTHROID) 100 MCG tablet, Take 1 tablet by mouth  daily, Disp: 90 tablet, Rfl: 1 .  lisinopril (PRINIVIL,ZESTRIL) 20 MG tablet, Take 20 mg by mouth daily., Disp: , Rfl:  .  magnesium oxide (MAG-OX) 400 MG tablet, Take 1 tablet by mouth  daily., Disp: , Rfl:  .  MULTIPLE VITAMIN PO, Take 1 tablet by mouth daily., Disp: , Rfl:  .  Omega 3-Lutein-Zeaxanthin (ADVANCED EYE HEALTH) 250-2.5-0.5 MG CAPS, Take 1 capsule by mouth daily., Disp: , Rfl:  .  Omega-3 Fatty Acids (FISH OIL EXTRA STRENGTH) 1200 MG CAPS, Take 1 capsule by mouth daily., Disp: , Rfl:  .  triamcinolone cream (KENALOG) 0.1 %, Apply 1 application topically 2 (two) times daily., Disp: , Rfl:  .  Turmeric (RA TURMERIC) 500 MG CAPS, Take by mouth., Disp: , Rfl:   Review of Systems  Constitutional: Negative.   Respiratory: Negative.   Cardiovascular: Negative.   Musculoskeletal: Positive for arthralgias and joint swelling. Negative for back pain, myalgias, neck pain and neck stiffness.       Chronic knee OA  Skin: Positive for wound. Negative for color change, pallor and rash.  Neurological: Negative for dizziness, tremors, seizures, syncope, facial asymmetry, speech difficulty, weakness, light-headedness, numbness and headaches.    Social History  Substance Use Topics  . Smoking status: Former Smoker    Quit date: 05/13/1996  . Smokeless tobacco: Never Used  . Alcohol use Yes     Comment: socialy   Objective:   BP 128/68 (BP Location: Right Arm, Patient Position: Sitting,  Cuff Size: Normal)   Pulse (!) 52   Temp 98.6 F (37 C)   Resp 16   Wt 249 lb (112.9 kg)   BMI 41.12 kg/m   Physical Exam  Constitutional: She appears well-developed and well-nourished. No distress.  Neck: Normal range of motion. Neck supple. No JVD present. No tracheal deviation present. No thyromegaly present.  Cardiovascular: Normal rate, regular rhythm and normal heart sounds.  Exam reveals no gallop and no friction rub.   No murmur heard. Pulmonary/Chest: Effort normal and breath sounds normal. No respiratory distress. She has no wheezes. She has no rales.  Musculoskeletal: She exhibits no edema.  Lymphadenopathy:    She has no cervical adenopathy.  Skin: She is not  diaphoretic.  Vitals reviewed.     Assessment & Plan:     1. Essential hypertension Doing well with the lower dose lisinopril 20mg . We'll continue this current dose. She is to call if she starts developing signs and symptoms of her blood pressure either getting to flow again or increasing again.  2. Hyperlipidemia Stable. Diagnosis pulled for medication refill. Continue current medical treatment plan. - atorvastatin (LIPITOR) 20 MG tablet; TAKE ONE TABLET BY MOUTH AT BEDTIME FOR CHOLESTEROL  Dispense: 90 tablet; Refill: 3  3. Other specified hypothyroidism Stable. Diagnosis pulled for medication refill. Continue current medical treatment plan. - levothyroxine (SYNTHROID, LEVOTHROID) 100 MCG tablet; Take 1 tablet (100 mcg total) by mouth daily.  Dispense: 90 tablet; Refill: Walker, PA-C  [facility]

## 2016-01-24 NOTE — Patient Instructions (Signed)
Hypertension Hypertension, commonly called high blood pressure, is when the force of blood pumping through your arteries is too strong. Your arteries are the blood vessels that carry blood from your heart throughout your body. A blood pressure reading consists of a higher number over a lower number, such as 110/72. The higher number (systolic) is the pressure inside your arteries when your heart pumps. The lower number (diastolic) is the pressure inside your arteries when your heart relaxes. Ideally you want your blood pressure below 120/80. Hypertension forces your heart to work harder to pump blood. Your arteries may become narrow or stiff. Having untreated or uncontrolled hypertension can cause heart attack, stroke, kidney disease, and other problems. RISK FACTORS Some risk factors for high blood pressure are controllable. Others are not.  Risk factors you cannot control include:   Race. You may be at higher risk if you are African American.  Age. Risk increases with age.  Gender. Men are at higher risk than women before age 45 years. After age 65, women are at higher risk than men. Risk factors you can control include:  Not getting enough exercise or physical activity.  Being overweight.  Getting too much fat, sugar, calories, or salt in your diet.  Drinking too much alcohol. SIGNS AND SYMPTOMS Hypertension does not usually cause signs or symptoms. Extremely high blood pressure (hypertensive crisis) may cause headache, anxiety, shortness of breath, and nosebleed. DIAGNOSIS To check if you have hypertension, your health care provider will measure your blood pressure while you are seated, with your arm held at the level of your heart. It should be measured at least twice using the same arm. Certain conditions can cause a difference in blood pressure between your right and left arms. A blood pressure reading that is higher than normal on one occasion does not mean that you need treatment. If  it is not clear whether you have high blood pressure, you may be asked to return on a different day to have your blood pressure checked again. Or, you may be asked to monitor your blood pressure at home for 1 or more weeks. TREATMENT Treating high blood pressure includes making lifestyle changes and possibly taking medicine. Living a healthy lifestyle can help lower high blood pressure. You may need to change some of your habits. Lifestyle changes may include:  Following the DASH diet. This diet is high in fruits, vegetables, and whole grains. It is low in salt, red meat, and added sugars.  Keep your sodium intake below 2,300 mg per day.  Getting at least 30-45 minutes of aerobic exercise at least 4 times per week.  Losing weight if necessary.  Not smoking.  Limiting alcoholic beverages.  Learning ways to reduce stress. Your health care provider may prescribe medicine if lifestyle changes are not enough to get your blood pressure under control, and if one of the following is true:  You are 18-59 years of age and your systolic blood pressure is above 140.  You are 60 years of age or older, and your systolic blood pressure is above 150.  Your diastolic blood pressure is above 90.  You have diabetes, and your systolic blood pressure is over 140 or your diastolic blood pressure is over 90.  You have kidney disease and your blood pressure is above 140/90.  You have heart disease and your blood pressure is above 140/90. Your personal target blood pressure may vary depending on your medical conditions, your age, and other factors. HOME CARE INSTRUCTIONS    Have your blood pressure rechecked as directed by your health care provider.   Take medicines only as directed by your health care provider. Follow the directions carefully. Blood pressure medicines must be taken as prescribed. The medicine does not work as well when you skip doses. Skipping doses also puts you at risk for  problems.  Do not smoke.   Monitor your blood pressure at home as directed by your health care provider. SEEK MEDICAL CARE IF:   You think you are having a reaction to medicines taken.  You have recurrent headaches or feel dizzy.  You have swelling in your ankles.  You have trouble with your vision. SEEK IMMEDIATE MEDICAL CARE IF:  You develop a severe headache or confusion.  You have unusual weakness, numbness, or feel faint.  You have severe chest or abdominal pain.  You vomit repeatedly.  You have trouble breathing. MAKE SURE YOU:   Understand these instructions.  Will watch your condition.  Will get help right away if you are not doing well or get worse.   This information is not intended to replace advice given to you by your health care provider. Make sure you discuss any questions you have with your health care provider.   Document Released: 04/30/2005 Document Revised: 09/14/2014 Document Reviewed: 02/20/2013 Elsevier Interactive Patient Education 2016 [employer]vier Inc.  

## 2016-03-15 ENCOUNTER — Ambulatory Visit (INDEPENDENT_AMBULATORY_CARE_PROVIDER_SITE_OTHER): Payer: Medicare Other

## 2016-03-15 DIAGNOSIS — Z23 Encounter for immunization: Secondary | ICD-10-CM

## 2016-06-26 ENCOUNTER — Ambulatory Visit (INDEPENDENT_AMBULATORY_CARE_PROVIDER_SITE_OTHER): Payer: Medicare Other | Admitting: Physician Assistant

## 2016-06-26 ENCOUNTER — Encounter: Payer: Self-pay | Admitting: Physician Assistant

## 2016-06-26 VITALS — BP 120/60 | HR 60 | Temp 98.1°F | Resp 16 | Ht 65.0 in | Wt 247.4 lb

## 2016-06-26 DIAGNOSIS — R1013 Epigastric pain: Secondary | ICD-10-CM | POA: Diagnosis not present

## 2016-06-26 DIAGNOSIS — E7800 Pure hypercholesterolemia, unspecified: Secondary | ICD-10-CM | POA: Diagnosis not present

## 2016-06-26 DIAGNOSIS — E039 Hypothyroidism, unspecified: Secondary | ICD-10-CM

## 2016-06-26 DIAGNOSIS — I10 Essential (primary) hypertension: Secondary | ICD-10-CM

## 2016-06-26 DIAGNOSIS — Z8379 Family history of other diseases of the digestive system: Secondary | ICD-10-CM | POA: Diagnosis not present

## 2016-06-26 DIAGNOSIS — Z1231 Encounter for screening mammogram for malignant neoplasm of breast: Secondary | ICD-10-CM | POA: Diagnosis not present

## 2016-06-26 DIAGNOSIS — Z0000 Encounter for general adult medical examination without abnormal findings: Secondary | ICD-10-CM

## 2016-06-26 DIAGNOSIS — Z1239 Encounter for other screening for malignant neoplasm of breast: Secondary | ICD-10-CM

## 2016-06-26 NOTE — Progress Notes (Signed)
[name], Female    DOB: January 14, 1941, 76 y.o.   MRN: [id number] Visit Date: 06/26/2016  Today's Provider: Mar Daring, PA-C   Chief Complaint  Patient presents with  . Medicare Wellness   Subjective:    Annual wellness visit Mary Kennedy is a 76 y.o. female. She feels well. She reports exercising. She reports she is sleeping well.  Last AWE: 06/23/2015 Mammogram: 06/08/2014 BI-RADS 1 Colonoscopy:06/01/14- Polyps, repeat in 3 years BMD:06/20/2010 Normal -----------------------------------------------------------   Review of Systems  Constitutional: Positive for fatigue.  HENT: Negative.   Eyes: Negative.   Respiratory: Negative.   Cardiovascular: Negative.   Gastrointestinal: Positive for abdominal pain.  Endocrine: Positive for polyuria.  Genitourinary: Negative.   Musculoskeletal: Positive for arthralgias.  Skin: Negative.   Allergic/Immunologic: Positive for environmental allergies.  Neurological: Negative.   Hematological: Negative.   Psychiatric/Behavioral: Negative.     Social History   Social History  . Marital status: Divorced    Spouse name: N/A  . Number of children: 2  . Years of education: N/A   Occupational History  . Retired    Social History Main Topics  . Smoking status: Former Smoker    Quit date: 05/13/1996  . Smokeless tobacco: Never Used  . Alcohol use Yes     Comment: socialy  . Drug use: No  . Sexual activity: Not on file   Other Topics Concern  . Not on file   Social History Narrative  . No narrative on file    Past Medical History:  Diagnosis Date  . Hyperlipidemia   . Hypertension   . Thyroid disease      Patient Active Problem List   Diagnosis Date Noted  . HLD (hyperlipidemia) 06/23/2015  . Bradycardia 04/25/2015  . Abnormal cervical Papanicolaou smear 12/01/2014  . H/O cardiovascular disorder 12/01/2014  . LBP (low back pain) 12/01/2014  . Adenopathy 12/01/2014  .  Atypical squamous cells of undetermined significance on cytologic smear of vagina (ASC-US) 12/01/2014  . Adenitis, salivary, recurring 12/01/2014  . Absence of bladder continence 12/01/2014  . Bursitis of knee 10/21/2013  . Arthritis of knee, degenerative 09/30/2013  . Carotid artery obstruction 04/20/2009  . Urinary system disease 10/19/2008  . Obesity, Class III, BMI 40-49.9 (morbid obesity) ([place]) 10/19/2008  . Family history of cardiovascular disease 07/06/2007  . Undiagnosed cardiac murmurs 02/23/2007  . History of colon polyps 02/23/2007  . Benign hypertension 02/23/2007  . Bladder compliance low 02/23/2007  . Acne erythematosa 02/23/2007  . Menopausal symptom 02/23/2007  . Hyperlipidemia 02/19/2007  . Adult hypothyroidism 02/19/2007    Past Surgical History:  Procedure Laterality Date  . ABDOMINAL HYSTERECTOMY  1961   BIL Oophorectomy  . APPENDECTOMY  at age 65  . BILATERAL CARPAL TUNNEL RELEASE  1999  . CARDIAC CATHETERIZATION    . FOOT SURGERY Bilateral   . TONSILLECTOMY  at age 73    Her family history includes Drug abuse in her brother; Heart attack in her father; Lung cancer in her brother; Pancreatitis in her mother.      Current Outpatient Prescriptions:  .  aspirin 81 MG tablet, Take 1 tablet by mouth every other day., Disp: , Rfl:  .  atorvastatin (LIPITOR) 20 MG tablet, TAKE ONE TABLET BY MOUTH AT BEDTIME FOR CHOLESTEROL, Disp: 90 tablet, Rfl: 3 .  B Complex CAPS, Take 1 capsule by mouth daily., Disp: , Rfl:  .  Calcium Carbonate-Vitamin D 600-200 MG-UNIT TABS,  Take 2 tablets by mouth daily., Disp: , Rfl:  .  Cholecalciferol (VITAMIN D3) 1000 UNITS CAPS, Take by mouth., Disp: , Rfl:  .  Chromium Picolinate 1000 MCG TABS, Take 1 tablet by mouth daily., Disp: , Rfl:  .  Coenzyme Q10 100 MG TABS, Take 1 capsule by mouth daily., Disp: , Rfl:  .  hydrochlorothiazide (HYDRODIURIL) 12.5 MG tablet, Take 0.5 tablets (6.25 mg total) by mouth daily., Disp: 45 tablet,  Rfl: 3 .  levothyroxine (SYNTHROID, LEVOTHROID) 100 MCG tablet, Take 1 tablet (100 mcg total) by mouth daily., Disp: 90 tablet, Rfl: 3 .  lisinopril (PRINIVIL,ZESTRIL) 20 MG tablet, Take 1 tablet (20 mg total) by mouth daily., Disp: 90 tablet, Rfl: 3 .  Magnesium 250 MG TABS, Take 1 tablet (250 mg total) by mouth daily., Disp: 30 tablet, Rfl: 0 .  MULTIPLE VITAMIN PO, Take 1 tablet by mouth daily., Disp: , Rfl:  .  Omega 3-Lutein-Zeaxanthin (ADVANCED EYE HEALTH) 250-2.5-0.5 MG CAPS, Take 1 capsule by mouth daily., Disp: , Rfl:  .  Omega-3 Fatty Acids (FISH OIL EXTRA STRENGTH) 1200 MG CAPS, Take 1 capsule by mouth daily., Disp: , Rfl:  .  triamcinolone cream (KENALOG) 0.1 %, Apply 1 application topically 2 (two) times daily., Disp: 80 g, Rfl: 1 .  Turmeric (RA TURMERIC) 500 MG CAPS, Take by mouth., Disp: , Rfl:   Patient Care Team: Mar Daring, PA-C as PCP - General (Family Medicine)     Objective:   Vitals: BP 120/60 (BP Location: Right Arm, Patient Position: Sitting, Cuff Size: Large)   Pulse 60   Temp 98.1 F (36.7 C) (Oral)   Resp 16   Ht 5\' 5"  (1.651 m)   Wt 247 lb 6.4 oz (112.2 kg)   BMI 41.17 kg/m   Physical Exam  Constitutional: She is oriented to person, place, and time. She appears well-developed and well-nourished. No distress.  HENT:  Head: Normocephalic and atraumatic.  Right Ear: Hearing, tympanic membrane, external ear and ear canal normal.  Left Ear: Hearing, tympanic membrane, external ear and ear canal normal.  Nose: Nose normal.  Mouth/Throat: Uvula is midline, oropharynx is clear and moist and mucous membranes are normal. She has dentures. No oropharyngeal exudate.  Eyes: Conjunctivae and EOM are normal. Pupils are equal, round, and reactive to light. Right eye exhibits no discharge. Left eye exhibits no discharge. No scleral icterus.  Neck: Normal range of motion. Neck supple. No JVD present. Carotid bruit is not present. No tracheal deviation present.  No thyromegaly present.  Cardiovascular: Normal rate, regular rhythm, normal heart sounds and intact distal pulses.  Exam reveals no gallop and no friction rub.   No murmur heard. Pulmonary/Chest: Effort normal and breath sounds normal. No respiratory distress. She has no wheezes. She has no rales. She exhibits no tenderness.  Abdominal: Soft. Bowel sounds are normal. She exhibits no distension and no mass. There is no tenderness. There is no rebound and no guarding.  Musculoskeletal: Normal range of motion. She exhibits no edema or tenderness.  Lymphadenopathy:    She has no cervical adenopathy.  Neurological: She is alert and oriented to person, place, and time.  Skin: Skin is warm and dry. No rash noted. She is not diaphoretic.  Psychiatric: She has a normal mood and affect. Her behavior is normal. Judgment and thought content normal.  Vitals reviewed.   Activities of Daily Living In your present state of health, do you have any difficulty performing the following activities:  06/26/2016  Hearing? N  Vision? N  Difficulty concentrating or making decisions? N  Walking or climbing stairs? Y  Dressing or bathing? N  Doing errands, shopping? N  Some recent data might be hidden    Fall Risk Assessment Fall Risk  06/26/2016 06/23/2015  Falls in the past year? No No     Depression Screen PHQ 2/9 Scores 06/26/2016 06/23/2015  PHQ - 2 Score 0 0    Cognitive Testing - 6-CIT  Correct? Score   What year is it? yes 0 0 or 4  What month is it? yes 0 0 or 3  Memorize:    Pia Mau,  [birth year],  [place],      What time is it? (within 1 hour) yes 0 0 or 3  Count backwards from 20 no 2 0, 2, or 4  Name the months of the year yes 0 0, 2, or 4  Repeat name & address above no 4 0, 2, 4, 6, 8, or 10       TOTAL SCORE  6/28   Interpretation:  Normal  Normal (0-7) Abnormal (8-28)   Audit-C Alcohol Use Screening  Question Answer Points  How often do you have alcoholic drink? 2-4 times  monthly 2  On days you do drink alcohol, how many drinks do you typically consume? 1-2 0  How oftey will you drink 6 or more in a total? never 0  Total Score:  2   A score of 3 or more in women, and 4 or more in men indicates increased risk for alcohol abuse, EXCEPT if all of the points are from question 1.     Assessment & Plan:     Annual Wellness Visit  Reviewed patient's Family Medical History Reviewed and updated list of patient's medical providers Assessment of cognitive impairment was done Assessed patient's functional ability Established a written schedule for health screening [place] Completed and Reviewed  Exercise Activities and Dietary recommendations Goals    None      Immunization History  Administered Date(s) Administered  . Influenza, High Dose Seasonal PF 03/15/2016  . Influenza-Unspecified 03/04/2015  . Pneumococcal Conjugate-13 03/22/2014  . Pneumococcal Polysaccharide-23 11/03/2012  . Tdap 06/20/2010  . Zoster 06/20/2010    Health Maintenance  Topic Date Due  . MAMMOGRAM  06/08/2016  . TETANUS/TDAP  06/20/2020  . COLONOSCOPY  06/01/2024  . INFLUENZA VACCINE  Completed  . DEXA SCAN  Completed  . ZOSTAVAX  Completed  . PNA vac Low Risk Adult  Completed     Discussed health benefits of physical activity, and encouraged her to engage in regular exercise appropriate for her age and condition.    1. Medicare annual wellness visit, subsequent Normal physical exam. She continues to be a part of a study with a group from [facility] on bone loss in post menopausal women and exercise/weight loss. Diet is based on nutritional content, not caloric. She has certain parameters she stays within. She is exercising (swimming and just recently started adding weights).  2. Breast cancer screening There is no family history of breast cancer. She does perform regular self breast exams. Mammogram was ordered as below. Information for  [facility] was given to patient so she may schedule her mammogram at her convenience. - MM Digital Screening; Future  3. Benign hypertension Stable. Continue lisinopril 20mg , HCTZ 12.5mg . Will check labs as below and f/u pending results. - CBC w/Diff/Platelet - Comprehensive Metabolic  Panel (CMET)  4. Adult hypothyroidism Having cold intolerance and fatigue. Has been stable on levothyroxine 173mcg. Will check labs as below and f/u pending results. - TSH  5. Pure hypercholesterolemia Stable. Continue atorvastatin 20mg . Will check labs as below and f/u pending results. - Comprehensive Metabolic Panel (CMET) - Lipid Profile  6. Epigastric pain Patient is having very intermittent epigastric pain. Denies it being GERD. I suspect it is GERD, however, as patient has used NSAIDs more frequently (still rarely) for knee pain and she does have long standing history of hiatal hernia that has not been problematic. She is concerned of pancreatitis. She states her mom passed from pancreatitis because she was treated as GERD for so long, but never fully improved, but it ended up being her pancreas. Will check lipase as below and f/u pending results.  - Lipase  7. Family history of pancreatitis See above medical treatment plan. - Lipase  ------------------------------------------------------------------------------------------------------------    Mar Daring, PA-C  [facility]

## 2016-06-26 NOTE — Patient Instructions (Signed)
Gastroesophageal Reflux Disease, Adult Normally, food travels down the esophagus and stays in the stomach to be digested. However, when a person has gastroesophageal reflux disease (GERD), food and stomach acid move back up into the esophagus. When this happens, the esophagus becomes sore and inflamed. Over time, GERD can create small holes (ulcers) in the lining of the esophagus. What are the causes? This condition is caused by a problem with the muscle between the esophagus and the stomach (lower esophageal sphincter, or LES). Normally, the LES muscle closes after food passes through the esophagus to the stomach. When the LES is weakened or abnormal, it does not close properly, and that allows food and stomach acid to go back up into the esophagus. The LES can be weakened by certain dietary substances, medicines, and medical conditions, including:  Tobacco use.  Pregnancy.  Having a hiatal hernia.  Heavy alcohol use.  Certain foods and beverages, such as coffee, chocolate, onions, and peppermint. What increases the risk? This condition is more likely to develop in:  People who have an increased body weight.  People who have connective tissue disorders.  People who use NSAID medicines. What are the signs or symptoms? Symptoms of this condition include:  Heartburn.  Difficult or painful swallowing.  The feeling of having a lump in the throat.  Abitter taste in the mouth.  Bad breath.  Having a large amount of saliva.  Having an upset or bloated stomach.  Belching.  Chest pain.  Shortness of breath or wheezing.  Ongoing (chronic) cough or a night-time cough.  Wearing away of tooth enamel.  Weight loss. Different conditions can cause chest pain. Make sure to see your health care provider if you experience chest pain. How is this diagnosed? Your health care provider will take a medical history and perform a physical exam. To determine if you have mild or severe GERD,  your health care provider may also monitor how you respond to treatment. You may also have other tests, including:  An endoscopy toexamine your stomach and esophagus with a small camera.  A test thatmeasures the acidity level in your esophagus.  A test thatmeasures how much pressure is on your esophagus.  A barium swallow or modified barium swallow to show the shape, size, and functioning of your esophagus. How is this treated? The goal of treatment is to help relieve your symptoms and to prevent complications. Treatment for this condition may vary depending on how severe your symptoms are. Your health care provider may recommend:  Changes to your diet.  Medicine.  Surgery. Follow these instructions at home: Diet  Follow a diet as recommended by your health care provider. This may involve avoiding foods and drinks such as:  Coffee and tea (with or without caffeine).  Drinks that containalcohol.  Energy drinks and sports drinks.  Carbonated drinks or sodas.  Chocolate and cocoa.  Peppermint and mint flavorings.  Garlic and onions.  Horseradish.  Spicy and acidic foods, including peppers, chili powder, curry powder, vinegar, hot sauces, and barbecue sauce.  Citrus fruit juices and citrus fruits, such as oranges, lemons, and limes.  Tomato-based foods, such as red sauce, chili, salsa, and pizza with red sauce.  Fried and fatty foods, such as donuts, french fries, potato chips, and high-fat dressings.  High-fat meats, such as hot dogs and fatty cuts of red and white meats, such as rib eye steak, sausage, ham, and bacon.  High-fat dairy items, such as whole milk, butter, and cream cheese.  Eat small, frequent meals instead of large meals.  Avoid drinking large amounts of liquid with your meals.  Avoid eating meals during the 2-3 hours before bedtime.  Avoid lying down right after you eat.  Do not exercise right after you eat. General instructions  Pay  attention to any changes in your symptoms.  Take over-the-counter and prescription medicines only as told by your health care provider. Do not take aspirin, ibuprofen, or other NSAIDs unless your health care provider told you to do so.  Do not use any tobacco products, including cigarettes, chewing tobacco, and e-cigarettes. If you need help quitting, ask your health care provider.  Wear loose-fitting clothing. Do not wear anything tight around your waist that causes pressure on your abdomen.  Raise (elevate) the head of your bed 6 inches (15cm).  Try to reduce your stress, such as with yoga or meditation. If you need help reducing stress, ask your health care provider.  If you are overweight, reduce your weight to an amount that is healthy for you. Ask your health care provider for guidance about a safe weight loss goal.  Keep all follow-up visits as told by your health care provider. This is important. Contact a health care provider if:  You have new symptoms.  You have unexplained weight loss.  You have difficulty swallowing, or it hurts to swallow.  You have wheezing or a persistent cough.  Your symptoms do not improve with treatment.  You have a hoarse voice. Get help right away if:  You have pain in your arms, neck, jaw, teeth, or back.  You feel sweaty, dizzy, or light-headed.  You have chest pain or shortness of breath.  You vomit and your vomit looks like blood or coffee grounds.  You faint.  Your stool is bloody or black.  You cannot swallow, drink, or eat. This information is not intended to replace advice given to you by your health care provider. Make sure you discuss any questions you have with your health care provider. Document Released: 02/07/2005 Document Revised: 09/28/2015 Document Reviewed: 08/25/2014 Elsevier Interactive Patient Education  2017 [employer] Maintenance for Postmenopausal Women Introduction Menopause is a normal process in  which your reproductive ability comes to an end. This process happens gradually over a span of months to years, usually between the ages of 25 and 63. Menopause is complete when you have missed 12 consecutive menstrual periods. It is important to talk with your health care provider about some of the most common conditions that affect postmenopausal women, such as heart disease, cancer, and bone loss (osteoporosis). Adopting a healthy lifestyle and getting preventive care can help to promote your health and wellness. Those actions can also lower your chances of developing some of these common conditions. What should I know about menopause? During menopause, you may experience a number of symptoms, such as:  Moderate-to-severe hot flashes.  Night sweats.  Decrease in sex drive.  Mood swings.  Headaches.  Tiredness.  Irritability.  Memory problems.  Insomnia. Choosing to treat or not to treat menopausal changes is an individual decision that you make with your health care provider. What should I know about hormone replacement therapy and supplements? Hormone therapy products are effective for treating symptoms that are associated with menopause, such as hot flashes and night sweats. Hormone replacement carries certain risks, especially as you become older. If you are thinking about using estrogen or estrogen with progestin treatments, discuss the benefits and risks with your health care  provider. What should I know about heart disease and stroke? Heart disease, heart attack, and stroke become more likely as you age. This may be due, in part, to the hormonal changes that your body experiences during menopause. These can affect how your body processes dietary fats, triglycerides, and cholesterol. Heart attack and stroke are both medical emergencies. There are many things that you can do to help prevent heart disease and stroke:  Have your blood pressure checked at least every 1-2 years. High  blood pressure causes heart disease and increases the risk of stroke.  If you are 75-63 years old, ask your health care provider if you should take aspirin to prevent a heart attack or a stroke.  Do not use any tobacco products, including cigarettes, chewing tobacco, or electronic cigarettes. If you need help quitting, ask your health care provider.  It is important to eat a healthy diet and maintain a healthy weight.  Be sure to include plenty of vegetables, fruits, low-fat dairy products, and lean protein.  Avoid eating foods that are high in solid fats, added sugars, or salt (sodium).  Get regular exercise. This is one of the most important things that you can do for your health.  Try to exercise for at least 150 minutes each week. The type of exercise that you do should increase your heart rate and make you sweat. This is known as moderate-intensity exercise.  Try to do strengthening exercises at least twice each week. Do these in addition to the moderate-intensity exercise.  Know your numbers.Ask your health care provider to check your cholesterol and your blood glucose. Continue to have your blood tested as directed by your health care provider. What should I know about cancer screening? There are several types of cancer. Take the following steps to reduce your risk and to catch any cancer development as early as possible. Breast Cancer  Practice breast self-awareness.  This means understanding how your breasts normally appear and feel.  It also means doing regular breast self-exams. Let your health care provider know about any changes, no matter how small.  If you are 29 or older, have a clinician do a breast exam (clinical breast exam or CBE) every year. Depending on your age, family history, and medical history, it may be recommended that you also have a yearly breast X-ray (mammogram).  If you have a family history of breast cancer, talk with your health care provider about  genetic screening.  If you are at high risk for breast cancer, talk with your health care provider about having an MRI and a mammogram every year.  Breast cancer (BRCA) gene test is recommended for women who have family members with BRCA-related cancers. Results of the assessment will determine the need for genetic counseling and BRCA1 and for BRCA2 testing. BRCA-related cancers include these types:  Breast. This occurs in males or females.  Ovarian.  Tubal. This may also be called fallopian tube cancer.  Cancer of the abdominal or pelvic lining (peritoneal cancer).  Prostate.  Pancreatic. Cervical, Uterine, and Ovarian Cancer  Your health care provider may recommend that you be screened regularly for cancer of the pelvic organs. These include your ovaries, uterus, and vagina. This screening involves a pelvic exam, which includes checking for microscopic changes to the surface of your cervix (Pap test).  For women ages 21-65, health care providers may recommend a pelvic exam and a Pap test every three years. For women ages 65-65, they may recommend the Pap test  and pelvic exam, combined with testing for human papilloma virus (HPV), every five years. Some types of HPV increase your risk of cervical cancer. Testing for HPV may also be done on women of any age who have unclear Pap test results.  Other health care providers may not recommend any screening for nonpregnant women who are considered low risk for pelvic cancer and have no symptoms. Ask your health care provider if a screening pelvic exam is right for you.  If you have had past treatment for cervical cancer or a condition that could lead to cancer, you need Pap tests and screening for cancer for at least 20 years after your treatment. If Pap tests have been discontinued for you, your risk factors (such as having a new sexual partner) need to be reassessed to determine if you should start having screenings again. Some women have medical  problems that increase the chance of getting cervical cancer. In these cases, your health care provider may recommend that you have screening and Pap tests more often.  If you have a family history of uterine cancer or ovarian cancer, talk with your health care provider about genetic screening.  If you have vaginal bleeding after reaching menopause, tell your health care provider.  There are currently no reliable tests available to screen for ovarian cancer. Lung Cancer  Lung cancer screening is recommended for adults 38-28 years old who are at high risk for lung cancer because of a history of smoking. A yearly low-dose CT scan of the lungs is recommended if you:  Currently smoke.  Have a history of at least 30 pack-years of smoking and you currently smoke or have quit within the past 15 years. A pack-year is smoking an average of one pack of cigarettes per day for one year. Yearly screening should:  Continue until it has been 15 years since you quit.  Stop if you develop a health problem that would prevent you from having lung cancer treatment. Colorectal Cancer  This type of cancer can be detected and can often be prevented.  Routine colorectal cancer screening usually begins at age 78 and continues through age 104.  If you have risk factors for colon cancer, your health care provider may recommend that you be screened at an earlier age.  If you have a family history of colorectal cancer, talk with your health care provider about genetic screening.  Your health care provider may also recommend using home test kits to check for hidden blood in your stool.  A small camera at the end of a tube can be used to examine your colon directly (sigmoidoscopy or colonoscopy). This is done to check for the earliest forms of colorectal cancer.  Direct examination of the colon should be repeated every 5-10 years until age 56. However, if early forms of precancerous polyps or small growths are found  or if you have a family history or genetic risk for colorectal cancer, you may need to be screened more often. Skin Cancer  Check your skin from head to toe regularly.  Monitor any moles. Be sure to tell your health care provider:  About any new moles or changes in moles, especially if there is a change in a mole's shape or color.  If you have a mole that is larger than the size of a pencil eraser.  If any of your family members has a history of skin cancer, especially at a young age, talk with your health care provider about genetic screening.  Always use sunscreen. Apply sunscreen liberally and repeatedly throughout the day.  Whenever you are outside, protect yourself by wearing long sleeves, pants, a wide-brimmed hat, and sunglasses. What should I know about osteoporosis? Osteoporosis is a condition in which bone destruction happens more quickly than new bone creation. After menopause, you may be at an increased risk for osteoporosis. To help prevent osteoporosis or the bone fractures that can happen because of osteoporosis, the following is recommended:  If you are 90-58 years old, get at least 1,000 mg of calcium and at least 600 mg of vitamin D per day.  If you are older than age 57 but younger than age 23, get at least 1,200 mg of calcium and at least 600 mg of vitamin D per day.  If you are older than age 66, get at least 1,200 mg of calcium and at least 800 mg of vitamin D per day. Smoking and excessive alcohol intake increase the risk of osteoporosis. Eat foods that are rich in calcium and vitamin D, and do weight-bearing exercises several times each week as directed by your health care provider. What should I know about how menopause affects my mental health? Depression may occur at any age, but it is more common as you become older. Common symptoms of depression include:  Low or sad mood.  Changes in sleep patterns.  Changes in appetite or eating patterns.  Feeling an  overall lack of motivation or enjoyment of activities that you previously enjoyed.  Frequent crying spells. Talk with your health care provider if you think that you are experiencing depression. What should I know about immunizations? It is important that you get and maintain your immunizations. These include:  Tetanus, diphtheria, and pertussis (Tdap) booster vaccine.  Influenza every year before the flu season begins.  Pneumonia vaccine.  Shingles vaccine. Your health care provider may also recommend other immunizations. This information is not intended to replace advice given to you by your health care provider. Make sure you discuss any questions you have with your health care provider. Document Released: 06/22/2005 Document Revised: 11/18/2015 Document Reviewed: 02/01/2015  2017 [employer]

## 2016-06-28 ENCOUNTER — Telehealth: Payer: Self-pay

## 2016-06-28 LAB — CBC WITH DIFFERENTIAL/PLATELET
BASOS ABS: 0.1 10*3/uL (ref 0.0–0.2)
Basos: 1 %
EOS (ABSOLUTE): 0.3 10*3/uL (ref 0.0–0.4)
Eos: 5 %
HEMOGLOBIN: 13 g/dL (ref 11.1–15.9)
Hematocrit: 40.1 % (ref 34.0–46.6)
Immature Grans (Abs): 0 10*3/uL (ref 0.0–0.1)
Immature Granulocytes: 0 %
LYMPHS ABS: 2.8 10*3/uL (ref 0.7–3.1)
Lymphs: 41 %
MCH: 31 pg (ref 26.6–33.0)
MCHC: 32.4 g/dL (ref 31.5–35.7)
MCV: 96 fL (ref 79–97)
MONOCYTES: 9 %
MONOS ABS: 0.6 10*3/uL (ref 0.1–0.9)
Neutrophils Absolute: 2.9 10*3/uL (ref 1.4–7.0)
Neutrophils: 44 %
PLATELETS: 234 10*3/uL (ref 150–379)
RBC: 4.2 x10E6/uL (ref 3.77–5.28)
RDW: 13.7 % (ref 12.3–15.4)
WBC: 6.7 10*3/uL (ref 3.4–10.8)

## 2016-06-28 LAB — COMPREHENSIVE METABOLIC PANEL
ALK PHOS: 64 IU/L (ref 39–117)
ALT: 29 IU/L (ref 0–32)
AST: 27 IU/L (ref 0–40)
Albumin/Globulin Ratio: 1.7 (ref 1.2–2.2)
Albumin: 4.1 g/dL (ref 3.5–4.8)
BUN/Creatinine Ratio: 26 (ref 12–28)
BUN: 27 mg/dL (ref 8–27)
Bilirubin Total: 0.3 mg/dL (ref 0.0–1.2)
CO2: 29 mmol/L (ref 18–29)
CREATININE: 1.03 mg/dL — AB (ref 0.57–1.00)
Calcium: 9.7 mg/dL (ref 8.7–10.3)
Chloride: 104 mmol/L (ref 96–106)
GFR calc Af Amer: 62 mL/min/{1.73_m2} (ref >59)
GFR calc non Af Amer: 54 mL/min/{1.73_m2} — ABNORMAL LOW (ref >59)
GLUCOSE: 98 mg/dL (ref 65–99)
Globulin, Total: 2.4 g/dL (ref 1.5–4.5)
Potassium: 5.1 mmol/L (ref 3.5–5.2)
Sodium: 145 mmol/L — ABNORMAL HIGH (ref 134–144)
Total Protein: 6.5 g/dL (ref 6.0–8.5)

## 2016-06-28 LAB — LIPID PANEL
CHOLESTEROL TOTAL: 159 mg/dL (ref 100–199)
Chol/HDL Ratio: 3.1 ratio units (ref 0.0–4.4)
HDL: 51 mg/dL (ref >39)
LDL Calculated: 92 mg/dL (ref 0–99)
Triglycerides: 82 mg/dL (ref 0–149)
VLDL CHOLESTEROL CAL: 16 mg/dL (ref 5–40)

## 2016-06-28 LAB — TSH: TSH: 4.56 u[IU]/mL — AB (ref 0.450–4.500)

## 2016-06-28 LAB — LIPASE: Lipase: 49 U/L (ref 14–85)

## 2016-06-28 MED ORDER — LEVOTHYROXINE SODIUM 112 MCG PO TABS: 112.0000 ug | ORAL_TABLET | Freq: Every day | ORAL | 0 refills | Status: DC

## 2016-06-28 NOTE — Telephone Encounter (Signed)
Unable to reach patient home/cell is disconnected. KW

## 2016-06-28 NOTE — Telephone Encounter (Signed)
-----   Message from Mar Daring, [place] sent at 06/28/2016 11:01 AM EST ----- All labs are within normal limits and stable with exception of TSH which is slightly up. May benefit from increasing levothyroxine slightly.  Thanks! -JB

## 2016-06-28 NOTE — Addendum Note (Signed)
Addended by: Mar Daring on: 06/28/2016 11:02 AM   Modules accepted: Orders

## 2016-06-29 NOTE — Telephone Encounter (Signed)
Unable to reach patient she has no emergency contact listed and no one is listed on DPR. KW

## 2016-07-02 NOTE — Telephone Encounter (Signed)
Letter mailed to home

## 2016-07-24 ENCOUNTER — Telehealth: Payer: Self-pay

## 2016-07-24 ENCOUNTER — Ambulatory Visit
Admission: RE | Admit: 2016-07-24 | Discharge: 2016-07-24 | Disposition: A | Discharge status: Home / Self Care | Payer: 59 | Source: Ambulatory Visit | Attending: Physician Assistant | Admitting: Physician Assistant

## 2016-07-24 DIAGNOSIS — Z1231 Encounter for screening mammogram for malignant neoplasm of breast: Secondary | ICD-10-CM | POA: Diagnosis not present

## 2016-07-24 DIAGNOSIS — Z1239 Encounter for other screening for malignant neoplasm of breast: Secondary | ICD-10-CM

## 2016-07-24 NOTE — Telephone Encounter (Signed)
Not available and unable to leave VM. Will try again later. Renaldo Fiddler, CMA

## 2016-07-24 NOTE — Telephone Encounter (Signed)
-----   Message from Mar Daring, [place] sent at 07/24/2016  4:06 PM EDT ----- Normal mammogram. Repeat screening in one year.

## 2016-07-26 NOTE — Telephone Encounter (Signed)
Left detail message and also advised that she can see her results through mychart. If any questions or concerns to call back.  Thanks,  -Milia Warth

## 2016-08-16 ENCOUNTER — Other Ambulatory Visit: Payer: Self-pay | Admitting: Physician Assistant

## 2016-08-16 DIAGNOSIS — E039 Hypothyroidism, unspecified: Secondary | ICD-10-CM

## 2016-08-16 NOTE — Telephone Encounter (Signed)
Last ov 06/26/16  Last filled 06/28/16 Please review. Thank you. sd

## 2016-09-27 ENCOUNTER — Telehealth: Payer: Self-pay | Admitting: Physician Assistant

## 2016-09-27 NOTE — Telephone Encounter (Signed)
Pt wants to know if she needs to get the new shingles vaccine.  Please advise  [phone]  Con Memos

## 2016-09-27 NOTE — Telephone Encounter (Signed)
Yes it is recommended and is first line over zostavax. Recommended for anyone previously vaccinated to still repeat with Shingrix. She can check with her pharmacy to see if they have it since we do not have it yet.

## 2016-09-27 NOTE — Telephone Encounter (Signed)
Pt was advised. She would like [place] to call her when these vaccines come in. I started a list that was placed on Joseline's desk to call pt when we receive these vaccines. Renaldo Fiddler, CMA

## 2016-10-18 ENCOUNTER — Other Ambulatory Visit: Payer: Self-pay | Admitting: Physician Assistant

## 2016-10-18 DIAGNOSIS — E039 Hypothyroidism, unspecified: Secondary | ICD-10-CM

## 2017-01-01 ENCOUNTER — Other Ambulatory Visit: Payer: Self-pay | Admitting: Physician Assistant

## 2017-01-01 DIAGNOSIS — E785 Hyperlipidemia, unspecified: Secondary | ICD-10-CM

## 2017-02-04 ENCOUNTER — Ambulatory Visit: Payer: Medicare Other | Admitting: Physician Assistant

## 2017-03-20 ENCOUNTER — Other Ambulatory Visit: Payer: Self-pay | Admitting: Physician Assistant

## 2017-03-20 DIAGNOSIS — E039 Hypothyroidism, unspecified: Secondary | ICD-10-CM

## 2017-04-08 ENCOUNTER — Telehealth: Payer: Self-pay

## 2017-04-08 NOTE — Telephone Encounter (Signed)
Patient calling that on Thursday Morning she woke up feeling SOB and restless. She reports that while sleeping she had palpitations.She reports that she has been having SOB off and on. Today she went to work and reports she went to [employer] to get something and while walking back to the car she had the SOB again.Patients denies Chest pain. Reports this are the only symptoms she is having. Patient wants an appointment tomorrow or Wednesday.Scheduled patient for Wednesday at 3:45pm. Per Tawanna Sat if patient starts to feel any other symptoms or symptoms get worse patient needs to go to the ED.Patient agree.

## 2017-04-10 ENCOUNTER — Ambulatory Visit
Admission: RE | Admit: 2017-04-10 | Discharge: 2017-04-10 | Disposition: A | Discharge status: Home / Self Care | Payer: Medicare Other | Source: Ambulatory Visit | Attending: Physician Assistant | Admitting: Physician Assistant

## 2017-04-10 ENCOUNTER — Ambulatory Visit: Payer: Medicare Other | Admitting: Physician Assistant

## 2017-04-10 ENCOUNTER — Encounter: Payer: Self-pay | Admitting: Physician Assistant

## 2017-04-10 VITALS — BP 148/80 | HR 58 | Temp 98.4°F | Resp 16 | Wt 256.6 lb

## 2017-04-10 DIAGNOSIS — R519 Headache, unspecified: Secondary | ICD-10-CM

## 2017-04-10 DIAGNOSIS — R51 Headache: Secondary | ICD-10-CM | POA: Insufficient documentation

## 2017-04-10 DIAGNOSIS — I700 Atherosclerosis of aorta: Secondary | ICD-10-CM | POA: Insufficient documentation

## 2017-04-10 DIAGNOSIS — R0602 Shortness of breath: Secondary | ICD-10-CM | POA: Diagnosis not present

## 2017-04-10 DIAGNOSIS — R0609 Other forms of dyspnea: Secondary | ICD-10-CM | POA: Diagnosis not present

## 2017-04-10 DIAGNOSIS — R002 Palpitations: Secondary | ICD-10-CM

## 2017-04-10 DIAGNOSIS — J40 Bronchitis, not specified as acute or chronic: Secondary | ICD-10-CM | POA: Diagnosis not present

## 2017-04-10 DIAGNOSIS — R918 Other nonspecific abnormal finding of lung field: Secondary | ICD-10-CM | POA: Insufficient documentation

## 2017-04-10 NOTE — Progress Notes (Signed)
[name] Female    DOB: 1940-12-15   76 y.o.   MRN: [id number] Visit Date: 04/10/2017  Today's Provider: Mar Daring, PA-C   Chief Complaint  Patient presents with  . Palpitations   Subjective:    Palpitations   This is a new problem. The current episode started in the past 7 days (Started Nov 22 when she woke up feeling restless with SOB). The problem occurs daily. The problem has been gradually worsening. Exacerbated by: walking or any movement. she works around a lot of dust. Associated symptoms include chest fullness (at night time and reports her heart rate is levated and she feels very SOB), an irregular heartbeat, malaise/fatigue and shortness of breath (worse at night). Pertinent negatives include no chest pain, coughing (only in the morning), dizziness, fever, near-syncope, numbness, vomiting or weakness. Associated symptoms comments: Weight gain. Patient reports that even going grocery shopping-she is gasping for air. She has tried breathing exercises (She is drinking a lot of fluids and has started [employer] sinuses) for the symptoms. Risk factors include family history, obesity, dyslipidemia and smoking/tobacco exposure (Father-stroke, Former smoker). Her past medical history is significant for hyperthyroidism.   She is to follow-up with the Cardiologist for Bradycardia and severe mitral regurgitation. She also reports that she has gain a lot of weight.  Patient reports she had Influenza vaccine at the pharmacy 01/2017    Allergies  Allergen Reactions  . Ciprofloxacin   . Tegaderm Ag Mesh 2"X2"  [Wound Dressings]     when removing the patch her skin came off.  . Penicillins Rash     Current Outpatient Medications:  .  atorvastatin (LIPITOR) 20 MG tablet, TAKE 1 TABLET BY MOUTH AT  BEDTIME FOR CHOLESTEROL, Disp: 90 tablet, Rfl: 1 .  B Complex CAPS, Take 1 capsule by mouth daily., Disp: , Rfl:  .  Calcium Carbonate-Vitamin D 600-200  MG-UNIT TABS, Take 2 tablets by mouth daily., Disp: , Rfl:  .  Cholecalciferol (VITAMIN D3) 1000 UNITS CAPS, Take by mouth., Disp: , Rfl:  .  Chromium Picolinate 1000 MCG TABS, Take 1 tablet by mouth daily., Disp: , Rfl:  .  Coenzyme Q10 100 MG TABS, Take 1 capsule by mouth daily., Disp: , Rfl:  .  hydrochlorothiazide (HYDRODIURIL) 12.5 MG tablet, TAKE ONE-HALF TABLET BY  MOUTH DAILY, Disp: 45 tablet, Rfl: 1 .  levothyroxine (SYNTHROID, LEVOTHROID) 112 MCG tablet, TAKE 1 TABLET BY MOUTH  DAILY BEFORE BREAKFAST, Disp: 90 tablet, Rfl: 1 .  lisinopril (PRINIVIL,ZESTRIL) 20 MG tablet, TAKE 1 TABLET BY MOUTH  DAILY, Disp: 90 tablet, Rfl: 1 .  Magnesium 250 MG TABS, Take 1 tablet (250 mg total) by mouth daily., Disp: 30 tablet, Rfl: 0 .  MULTIPLE VITAMIN PO, Take 1 tablet by mouth daily., Disp: , Rfl:  .  Omega 3-Lutein-Zeaxanthin (ADVANCED EYE HEALTH) 250-2.5-0.5 MG CAPS, Take 1 capsule by mouth daily., Disp: , Rfl:  .  Omega-3 Fatty Acids (FISH OIL EXTRA STRENGTH) 1200 MG CAPS, Take 1 capsule by mouth daily., Disp: , Rfl:  .  triamcinolone cream (KENALOG) 0.1 %, Apply 1 application topically 2 (two) times daily., Disp: 80 g, Rfl: 1 .  aspirin 81 MG tablet, Take 1 tablet by mouth every other day., Disp: , Rfl:   Review of Systems  Constitutional: Positive for fatigue, malaise/fatigue and unexpected weight change. Negative for fever.  Respiratory: Positive for shortness of breath (worse at night). Negative for cough (only in  the morning) and chest tightness.   Cardiovascular: Positive for palpitations. Negative for chest pain, leg swelling and near-syncope.  Gastrointestinal: Negative.  Negative for vomiting.  Neurological: Negative for dizziness, weakness, numbness and headaches.  Psychiatric/Behavioral: Positive for sleep disturbance.    Social History   Tobacco Use  . Smoking status: Former Smoker    Last attempt to quit: 05/13/1996    Years since quitting: 20.9  . Smokeless tobacco: Never  Used  Substance Use Topics  . Alcohol use: Yes    Comment: socialy   Objective:   BP (!) 148/80 (BP Location: Left Arm, Patient Position: Sitting, Cuff Size: Large)   Pulse (!) 58   Temp 98.4 F (36.9 C) (Oral)   Resp 16   Wt 256 lb 9.6 oz (116.4 kg)   SpO2 96%   BMI 42.70 kg/m    Physical Exam  Constitutional: She appears well-developed and well-nourished. No distress.  HENT:  Head: Normocephalic and atraumatic.  Right Ear: Hearing, tympanic membrane, external ear and ear canal normal.  Left Ear: Hearing, tympanic membrane, external ear and ear canal normal.  Nose: Nose normal.  Mouth/Throat: Uvula is midline, oropharynx is clear and moist and mucous membranes are normal. No oropharyngeal exudate.  Eyes: Conjunctivae are normal. Pupils are equal, round, and reactive to light. Right eye exhibits no discharge. Left eye exhibits no discharge. No scleral icterus.  Neck: Normal range of motion. Neck supple. No JVD present. No tracheal deviation present. No thyromegaly present.  Cardiovascular: Normal rate and regular rhythm. Exam reveals no gallop and no friction rub.  Murmur heard.  Systolic murmur is present with a grade of 3/6. Severe mitral regurg  Pulmonary/Chest: Effort normal. No stridor. No respiratory distress. She has decreased breath sounds. She has no wheezes. She has no rales.  Musculoskeletal: She exhibits no edema.  Lymphadenopathy:    She has no cervical adenopathy.  Skin: Skin is warm and dry. She is not diaphoretic.  Vitals reviewed.   CLINICAL DATA: 76 year old female with difficulty breathing. Heart palpitations in weight gain since November.  EXAM: CHEST 2 VIEW  COMPARISON: Chest x-ray 06/04/2014.  FINDINGS: Mild diffuse peribronchial cuffing. Lung volumes are normal. No consolidative airspace disease. No pleural effusions. No pneumothorax. No pulmonary nodule or mass noted. Pulmonary vasculature and the cardiomediastinal silhouette are within  normal limits. Atherosclerosis in the thoracic aorta.  IMPRESSION: 1. Mild diffuse peribronchial cuffing, concerning for an acute bronchitis. 2. Aortic atherosclerosis.   Electronically Signed By: Vinnie Langton M.D. On: 04/11/2017 08:01    Assessment & Plan:     1. Heart palpitations EKG shows sinus bradycardia rate of 58 with RBBB stable and unchanged from 04/25/15. Concerned for possible early heart failure or worsening mitral regurg. Patient is already established with Dr. Clayborn Bigness and was advised to call for f/u as she is to follow up annually, which would be due mid December 2018. Will get CXR as below to r/o lung causes, cardiomegaly. I will f/u pending results.  - EKG 12-Lead - CBC w/Diff/Platelet - Basic Metabolic Panel (BMET) - B Nat Peptide - DG Chest 2 View; Future  2. SOB (shortness of breath) See above medical treatment plan. - EKG 12-Lead - CBC w/Diff/Platelet - Basic Metabolic Panel (BMET) - B Nat Peptide - DG Chest 2 View; Future  3. DOE (dyspnea on exertion) See above medical treatment plan. - CBC w/Diff/Platelet - Basic Metabolic Panel (BMET) - B Nat Peptide - DG Chest 2 View; Future  4. Acute nonintractable headache,  unspecified headache type See above medical treatment plan. - CBC w/Diff/Platelet - Basic Metabolic Panel (BMET) - B Nat Peptide - DG Chest 2 View; Future  5. Bronchitis CXR results were normal with exception of possible bronchitic changes. Will give zpak and prednisone as below. Patient is to call if symptoms worsen or do not improve.  - azithromycin (ZITHROMAX) 250 MG tablet; Take 2 tablets PO on day one, and one tablet PO daily thereafter until completed.  Dispense: 6 tablet; Refill: 0 - predniSONE (STERAPRED UNI-PAK 21 TAB) 10 MG (21) TBPK tablet; Take as directed on package instructions  Dispense: 21 tablet; Refill: 0       Mar Daring, PA-C  [facility]

## 2017-04-11 ENCOUNTER — Encounter: Payer: Self-pay | Admitting: Physician Assistant

## 2017-04-11 LAB — BASIC METABOLIC PANEL WITH GFR
BUN/Creatinine Ratio: 20 (calc) (ref 6–22)
BUN: 20 mg/dL (ref 7–25)
CALCIUM: 10 mg/dL (ref 8.6–10.4)
CHLORIDE: 104 mmol/L (ref 98–110)
CO2: 32 mmol/L (ref 20–32)
CREATININE: 1.02 mg/dL — AB (ref 0.60–0.93)
GFR, Est African American: 62 mL/min/{1.73_m2} (ref >=60)
GFR, Est Non African American: 54 mL/min/{1.73_m2} — ABNORMAL LOW (ref >=60)
GLUCOSE: 90 mg/dL (ref 65–99)
Potassium: 4.5 mmol/L (ref 3.5–5.3)
Sodium: 142 mmol/L (ref 135–146)

## 2017-04-11 LAB — CBC WITH DIFFERENTIAL/PLATELET
BASOS PCT: 1.2 %
Basophils Absolute: 106 cells/uL (ref 0–200)
EOS ABS: 352 {cells}/uL (ref 15–500)
EOS PCT: 4 %
HCT: 37.6 % (ref 35.0–45.0)
HEMOGLOBIN: 12.8 g/dL (ref 11.7–15.5)
LYMPHS ABS: 3018 {cells}/uL (ref 850–3900)
MCH: 31.6 pg (ref 27.0–33.0)
MCHC: 34 g/dL (ref 32.0–36.0)
MCV: 92.8 fL (ref 80.0–100.0)
MONOS PCT: 10.2 %
MPV: 11.6 fL (ref 7.5–12.5)
NEUTROS ABS: 4426 {cells}/uL (ref 1500–7800)
Neutrophils Relative %: 50.3 %
Platelets: 244 10*3/uL (ref 140–400)
RBC: 4.05 10*6/uL (ref 3.80–5.10)
RDW: 12 % (ref 11.0–15.0)
Total Lymphocyte: 34.3 %
WBC mixed population: 898 cells/uL (ref 200–950)
WBC: 8.8 10*3/uL (ref 3.8–10.8)

## 2017-04-11 LAB — BRAIN NATRIURETIC PEPTIDE: BRAIN NATRIURETIC PEPTIDE: 116 pg/mL — AB (ref <100)

## 2017-04-11 MED ORDER — PREDNISONE 10 MG (21) PO TBPK: ORAL_TABLET | ORAL | 0 refills | Status: DC

## 2017-04-11 MED ORDER — AZITHROMYCIN 250 MG PO TABS: ORAL_TABLET | ORAL | 0 refills | Status: DC

## 2017-04-11 NOTE — Progress Notes (Signed)
Advised and patient noted she was doing a little better and was able to sleep better last night.

## 2017-04-12 NOTE — Progress Notes (Signed)
Advised  ED 

## 2017-06-24 ENCOUNTER — Ambulatory Visit (INDEPENDENT_AMBULATORY_CARE_PROVIDER_SITE_OTHER): Payer: Medicare Other

## 2017-06-24 VITALS — BP 172/68 | HR 60 | Temp 98.0°F | Ht 65.0 in | Wt 264.2 lb

## 2017-06-24 DIAGNOSIS — Z0000 Encounter for general adult medical examination without abnormal findings: Secondary | ICD-10-CM | POA: Diagnosis not present

## 2017-06-24 NOTE — Progress Notes (Signed)
Subjective:   Mary Kennedy is a 77 y.o. female who presents for Medicare Annual (Subsequent) preventive examination.   Review of Systems:  N/A  Cardiac Risk Factors include: advanced age (>70men, >92 women);dyslipidemia;hypertension;obesity (BMI >30kg/m2)     Objective:     Vitals: BP (!) 172/68 (BP Location: Left Arm)   Pulse 60   Temp 98 F (36.7 C) (Oral)   Ht 5\' 5"  (1.651 m)   Wt 264 lb 3.2 oz (119.8 kg)   BMI 43.97 kg/m   Body mass index is 43.97 kg/m.  Advanced Directives 06/24/2017 04/25/2015  Does Patient Have a Medical Advance Directive? Yes Yes  Type of Advance Directive Living will Living will    Tobacco Social History   Tobacco Use  Smoking Status Former Smoker  . Types: Cigarettes  . Last attempt to quit: 05/13/1996  . Years since quitting: 21.1  Smokeless Tobacco Never Used     Counseling given: Not Answered   Clinical Intake:  Pre-visit preparation completed: Yes  Pain : No/denies pain Pain Score: 0-No pain     Nutritional Status: BMI > 30  Obese Nutritional Risks: Nausea/ vomitting/ diarrhea(diarrhea yesterday - possibly due to food) Diabetes: No  How often do you need to have someone help you when you read instructions, pamphlets, or other written materials from your doctor or pharmacy?: 1 - Never  Interpreter Needed?: No  Information entered by :: [facility], LPN  Past Medical History:  Diagnosis Date  . Hyperlipidemia   . Hypertension   . Thyroid disease    Past Surgical History:  Procedure Laterality Date  . ABDOMINAL HYSTERECTOMY  1961   BIL Oophorectomy  . APPENDECTOMY  at age 63  . BILATERAL CARPAL TUNNEL RELEASE  1999  . CARDIAC CATHETERIZATION    . FOOT SURGERY Bilateral   . TONSILLECTOMY  at age 39   Family History  Problem Relation Age of Onset  . Pancreatitis Mother   . Drug abuse Brother        PTSD  . Heart attack Father   . Lung cancer Brother   . Breast cancer Cousin        2nd cousin   Social  History   Socioeconomic History  . Marital status: Divorced    Spouse name: None  . Number of children: 2  . Years of education: None  . Highest education level: Some college, no degree  Social Needs  . Financial resource strain: Not hard at all  . Food insecurity - worry: Never true  . Food insecurity - inability: Never true  . Transportation needs - medical: No  . Transportation needs - non-medical: No  Occupational History  . Occupation: Retired    Comment: working part-time  Tobacco Use  . Smoking status: Former Smoker    Types: Cigarettes    Last attempt to quit: 05/13/1996    Years since quitting: 21.1  . Smokeless tobacco: Never Used  Substance and Sexual Activity  . Alcohol use: Yes    Alcohol/week: 0.0 - 0.6 oz    Comment: 1 glass of wine occasionally   . Drug use: No  . Sexual activity: None  Other Topics Concern  . None  Social History Narrative  . None    Outpatient Encounter Medications as of 06/24/2017  Medication Sig  . acetaminophen (TYLENOL) 650 MG CR tablet Take 650 mg by mouth every 8 (eight) hours as needed for pain.  Marland Kitchen aspirin 81 MG tablet Take 1 tablet by  mouth every other day.  Marland Kitchen atorvastatin (LIPITOR) 20 MG tablet TAKE 1 TABLET BY MOUTH AT  BEDTIME FOR CHOLESTEROL  . B Complex CAPS Take 1 capsule by mouth daily.  . Biotin (BIOTIN 5000) 5 MG CAPS Take 5 mg by mouth.  . Calcium Carbonate-Vitamin D 600-200 MG-UNIT TABS Take 2 tablets by mouth daily.  . Cholecalciferol (VITAMIN D3) 1000 UNITS CAPS Take by mouth.  . Chromium Picolinate 1000 MCG TABS Take 1 tablet by mouth daily.  . Coenzyme Q10 100 MG TABS Take 1 capsule by mouth daily.  . Cyanocobalamin (B-12) 2500 MCG TABS Take 2,500 mcg by mouth daily.  . Glucos-Chond-Hyal Ac-Ca Fructo (MOVE FREE JOINT HEALTH ADVANCE) TABS Take by mouth daily.  . hydrochlorothiazide (HYDRODIURIL) 12.5 MG tablet TAKE ONE-HALF TABLET BY  MOUTH DAILY  . levothyroxine (SYNTHROID, LEVOTHROID) 112 MCG tablet TAKE 1  TABLET BY MOUTH  DAILY BEFORE BREAKFAST  . lisinopril (PRINIVIL,ZESTRIL) 20 MG tablet TAKE 1 TABLET BY MOUTH  DAILY  . Magnesium 250 MG TABS Take 1 tablet (250 mg total) by mouth daily. (Patient taking differently: Take 400 mg by mouth daily. )  . MULTIPLE VITAMIN PO Take 1 tablet by mouth daily.   . Multiple Vitamins-Minerals (ICAPS AREDS 2 PO) Take by mouth daily.  . naproxen sodium (ALEVE) 220 MG tablet Take 220 mg by mouth daily as needed.  . Omega-3 Fatty Acids (FISH OIL EXTRA STRENGTH) 1200 MG CAPS Take 1 capsule by mouth daily.  . simethicone (MYLICON) 712 MG chewable tablet Chew 125 mg by mouth daily.  Marland Kitchen triamcinolone cream (KENALOG) 0.1 % Apply 1 application topically 2 (two) times daily.  . Zinc 50 MG CAPS Take 50 mg by mouth daily.  . [DISCONTINUED] azithromycin (ZITHROMAX) 250 MG tablet Take 2 tablets PO on day one, and one tablet PO daily thereafter until completed.  . [DISCONTINUED] Omega 3-Lutein-Zeaxanthin (ADVANCED EYE HEALTH) 250-2.5-0.5 MG CAPS Take 1 capsule by mouth daily.  . [DISCONTINUED] predniSONE (STERAPRED UNI-PAK 21 TAB) 10 MG (21) TBPK tablet Take as directed on package instructions   No facility-administered encounter medications on file as of 06/24/2017.     Activities of Daily Living In your present state of health, do you have any difficulty performing the following activities: 06/24/2017 06/26/2016  Hearing? N N  Vision? N N  Difficulty concentrating or making decisions? N N  Walking or climbing stairs? N Y  Dressing or bathing? N N  Doing errands, shopping? N N  Preparing Food and eating ? N -  Using the Toilet? N -  In the past six months, have you accidently leaked urine? N -  Do you have problems with loss of bowel control? N -  Managing your Medications? N -  Managing your Finances? N -  Housekeeping or managing your Housekeeping? N -  Some recent data might be hidden    Patient Care Team: Mar Daring, PA-C as PCP - General (Family  Medicine) Birder Robson, MD as Referring Physician (Ophthalmology) Yolonda Kida, MD as Consulting Physician (Cardiology)    Assessment:   This is a routine wellness examination for [facility].  Exercise Activities and Dietary recommendations Current Exercise Habits: The patient does not participate in regular exercise at present, Exercise limited by: None identified  Goals    . Exercise 3x per week (30 min per time)     Recommend some form of exercise 3 days a week for at least 30 minutes. Pt to start swimming and doing eccentrics for  3-4 days a week.        Fall Risk Fall Risk  06/24/2017 06/26/2016 06/23/2015  Falls in the past year? No No No   Is the patient's home free of loose throw rugs in walkways, pet beds, electrical cords, etc?   yes      Grab bars in the bathroom? no      Handrails on the stairs?   yes      Adequate lighting?   yes  Timed Get Up and Go performed: N/A  Depression Screen PHQ 2/9 Scores 06/24/2017 06/26/2016 06/23/2015  PHQ - 2 Score 0 0 0     Cognitive Function     6CIT Screen 06/24/2017  What Year? 0 points  What month? 0 points  What time? 0 points  Count back from 20 0 points  Months in reverse 0 points  Repeat phrase 0 points  Total Score 0    Immunization History  Administered Date(s) Administered  . Influenza Split 02/19/2007, 02/27/2011  . Influenza, High Dose Seasonal PF 02/09/2014, 03/15/2016  . Influenza-Unspecified 03/04/2015, 03/05/2017  . Pneumococcal Conjugate-13 03/22/2014  . Pneumococcal Polysaccharide-23 11/03/2012  . Tdap 06/20/2010  . Zoster 06/20/2010    Qualifies for Shingles Vaccine? Due for Shingles vaccine. Declined my offer to administer today. Education has been provided regarding the importance of this vaccine. Pt has been advised to call her insurance company to determine her out of pocket expense. Advised she may also receive this vaccine at her local pharmacy or Health Dept. Verbalized acceptance and  understanding.  Screening Tests Health Maintenance  Topic Date Due  . COLONOSCOPY  06/01/2017  . TETANUS/TDAP  06/20/2020  . INFLUENZA VACCINE  Completed  . DEXA SCAN  Completed  . PNA vac Low Risk Adult  Completed    Cancer Screenings: Lung: Low Dose CT Chest recommended if Age 27-80 years, 30 pack-year currently smoking OR have quit w/in 15years. Patient does not qualify. Breast:  Up to date on Mammogram? Yes   Up to date of Bone Density/Dexa? Yes Colorectal: Currently due, pt declines colonoscopy referral. Pt would like to speak with PCP further about trying out the cologuard.   Additional Screenings:  Hepatitis B/HIV/Syphillis: Pt declines today.  Hepatitis C Screening: Pt declines today.      Plan:  I have personally reviewed and addressed the Medicare Annual Wellness questionnaire and have noted the following in the patient's chart:  A. Medical and social history B. Use of alcohol, tobacco or illicit drugs  C. Current medications and supplements D. Functional ability and status E.  Nutritional status F.  Physical activity G. Advance directives H. List of other physicians I.  Hospitalizations, surgeries, and ER visits in previous 12 months J.  [place] such as hearing and vision if needed, cognitive and depression L. Referrals and appointments - none  In addition, I have reviewed and discussed with patient certain preventive protocols, quality metrics, and best practice recommendations. A written personalized care plan for preventive services as well as general preventive health recommendations were provided to patient.  See attached scanned questionnaire for additional information.   Signed,  Fabio Neighbors, LPN Nurse Health Advisor   Nurse Recommendations: Pt declines colonoscopy referral. Pt would like to speak with PCP further about trying out the cologuard. Information given. B/P was elevated today. Pt declines any current s/s. Recommend pt to  check her BP twice daily and record readings. Pt to bring record into next OV with PCP on 07/01/17.

## 2017-06-24 NOTE — Patient Instructions (Signed)
Mary Kennedy , Thank you for taking time to come for your Medicare Wellness Visit. I appreciate your ongoing commitment to your health goals. Please review the following plan we discussed and let me know if I can assist you in the future.   Screening recommendations/referrals: Colonoscopy: Currently due, declines referral today. Will follow up with PCP on cologuard option. Mammogram: Up to date Bone Density: Up to date Recommended yearly ophthalmology/optometry visit for glaucoma screening and checkup Recommended yearly dental visit for hygiene and checkup  Vaccinations: Influenza vaccine: Up to date Pneumococcal vaccine: Up to date Tdap vaccine: Up to date Shingles vaccine: Pt declines today.     Advanced directives: Please bring a copy of your POA (Power of Attorney) and/or Living Will to your next appointment.   Conditions/risks identified: Obesity- Recommend some form of exercise 3 days a week for at least 30 minutes. Pt to start swimming and doing eccentrics for 3-4 days a week.   Next appointment: 07/01/17 @ 10:00 AM   Preventive Care 65 Years and Older, Female Preventive care refers to lifestyle choices and visits with your health care provider that can promote health and wellness. What does preventive care include?  A yearly physical exam. This is also called an annual well check.  Dental exams once or twice a year.  Routine eye exams. Ask your health care provider how often you should have your eyes checked.  Personal lifestyle choices, including:  Daily care of your teeth and gums.  Regular physical activity.  Eating a healthy diet.  Avoiding tobacco and drug use.  Limiting alcohol use.  Practicing safe sex.  Taking low-dose aspirin every day.  Taking vitamin and mineral supplements as recommended by your health care provider. What happens during an annual well check? The services and screenings done by your health care provider during your annual well check  will depend on your age, overall health, lifestyle risk factors, and family history of disease. Counseling  Your health care provider may ask you questions about your:  Alcohol use.  Tobacco use.  Drug use.  Emotional well-being.  Home and relationship well-being.  Sexual activity.  Eating habits.  History of falls.  Memory and ability to understand (cognition).  Work and work Statistician.  Reproductive health. Screening  You may have the following tests or measurements:  Height, weight, and BMI.  Blood pressure.  Lipid and cholesterol levels. These may be checked every 5 years, or more frequently if you are over 40 years old.  Skin check.  Lung cancer screening. You may have this screening every year starting at age 75 if you have a 30-pack-year history of smoking and currently smoke or have quit within the past 15 years.  Fecal occult blood test (FOBT) of the stool. You may have this test every year starting at age 70.  Flexible sigmoidoscopy or colonoscopy. You may have a sigmoidoscopy every 5 years or a colonoscopy every 10 years starting at age 19.  Hepatitis C blood test.  Hepatitis B blood test.  Sexually transmitted disease (STD) testing.  Diabetes screening. This is done by checking your blood sugar (glucose) after you have not eaten for a while (fasting). You may have this done every 1-3 years.  Bone density scan. This is done to screen for osteoporosis. You may have this done starting at age 89.  Mammogram. This may be done every 1-2 years. Talk to your health care provider about how often you should have regular mammograms. Talk with your  health care provider about your test results, treatment options, and if necessary, the need for more tests. Vaccines  Your health care provider may recommend certain vaccines, such as:  Influenza vaccine. This is recommended every year.  Tetanus, diphtheria, and acellular pertussis (Tdap, Td) vaccine. You may  need a Td booster every 10 years.  Zoster vaccine. You may need this after age 45.  Pneumococcal 13-valent conjugate (PCV13) vaccine. One dose is recommended after age 18.  Pneumococcal polysaccharide (PPSV23) vaccine. One dose is recommended after age 70. Talk to your health care provider about which screenings and vaccines you need and how often you need them. This information is not intended to replace advice given to you by your health care provider. Make sure you discuss any questions you have with your health care provider. Document Released: 05/27/2015 Document Revised: 01/18/2016 Document Reviewed: 03/01/2015 Elsevier Interactive Patient Education  2017 [place] Prevention in the Home Falls can cause injuries. They can happen to people of all ages. There are many things you can do to make your home safe and to help prevent falls. What can I do on the outside of my home?  Regularly fix the edges of walkways and driveways and fix any cracks.  Remove anything that might make you trip as you walk through a door, such as a raised step or threshold.  Trim any bushes or trees on the path to your home.  Use bright outdoor lighting.  Clear any walking paths of anything that might make someone trip, such as rocks or tools.  Regularly check to see if handrails are loose or broken. Make sure that both sides of any steps have handrails.  Any raised decks and porches should have guardrails on the edges.  Have any leaves, snow, or ice cleared regularly.  Use sand or salt on walking paths during winter.  Clean up any spills in your garage right away. This includes oil or grease spills. What can I do in the bathroom?  Use night lights.  Install grab bars by the toilet and in the tub and shower. Do not use towel bars as grab bars.  Use non-skid mats or decals in the tub or shower.  If you need to sit down in the shower, use a plastic, non-slip stool.  Keep the floor  dry. Clean up any water that spills on the floor as soon as it happens.  Remove soap buildup in the tub or shower regularly.  Attach bath mats securely with double-sided non-slip rug tape.  Do not have throw rugs and other things on the floor that can make you trip. What can I do in the bedroom?  Use night lights.  Make sure that you have a light by your bed that is easy to reach.  Do not use any sheets or blankets that are too big for your bed. They should not hang down onto the floor.  Have a firm chair that has side arms. You can use this for support while you get dressed.  Do not have throw rugs and other things on the floor that can make you trip. What can I do in the kitchen?  Clean up any spills right away.  Avoid walking on wet floors.  Keep items that you use a lot in easy-to-reach places.  If you need to reach something above you, use a strong step stool that has a grab bar.  Keep electrical cords out of the way.  Do not use  floor polish or wax that makes floors slippery. If you must use wax, use non-skid floor wax.  Do not have throw rugs and other things on the floor that can make you trip. What can I do with my stairs?  Do not leave any items on the stairs.  Make sure that there are handrails on both sides of the stairs and use them. Fix handrails that are broken or loose. Make sure that handrails are as long as the stairways.  Check any carpeting to make sure that it is firmly attached to the stairs. Fix any carpet that is loose or worn.  Avoid having throw rugs at the top or bottom of the stairs. If you do have throw rugs, attach them to the floor with carpet tape.  Make sure that you have a light switch at the top of the stairs and the bottom of the stairs. If you do not have them, ask someone to add them for you. What else can I do to help prevent falls?  Wear shoes that:  Do not have high heels.  Have rubber bottoms.  Are comfortable and fit you  well.  Are closed at the toe. Do not wear sandals.  If you use a stepladder:  Make sure that it is fully opened. Do not climb a closed stepladder.  Make sure that both sides of the stepladder are locked into place.  Ask someone to hold it for you, if possible.  Clearly mark and make sure that you can see:  Any grab bars or handrails.  First and last steps.  Where the edge of each step is.  Use tools that help you move around (mobility aids) if they are needed. These include:  Canes.  Walkers.  Scooters.  Crutches.  Turn on the lights when you go into a dark area. Replace any light bulbs as soon as they burn out.  Set up your furniture so you have a clear path. Avoid moving your furniture around.  If any of your floors are uneven, fix them.  If there are any pets around you, be aware of where they are.  Review your medicines with your doctor. Some medicines can make you feel dizzy. This can increase your chance of falling. Ask your doctor what other things that you can do to help prevent falls. This information is not intended to replace advice given to you by your health care provider. Make sure you discuss any questions you have with your health care provider. Document Released: 02/24/2009 Document Revised: 10/06/2015 Document Reviewed: 06/04/2014 Elsevier Interactive Patient Education  2017 [employer].

## 2017-06-28 ENCOUNTER — Encounter: Payer: Self-pay | Admitting: Physician Assistant

## 2017-07-01 ENCOUNTER — Encounter: Payer: Medicare Other | Admitting: Physician Assistant

## 2017-07-04 ENCOUNTER — Ambulatory Visit (INDEPENDENT_AMBULATORY_CARE_PROVIDER_SITE_OTHER): Payer: Medicare Other | Admitting: Physician Assistant

## 2017-07-04 ENCOUNTER — Encounter: Payer: Self-pay | Admitting: Physician Assistant

## 2017-07-04 VITALS — BP 130/70 | HR 67 | Temp 97.7°F | Resp 16 | Ht 65.0 in | Wt 259.0 lb

## 2017-07-04 DIAGNOSIS — R001 Bradycardia, unspecified: Secondary | ICD-10-CM | POA: Diagnosis not present

## 2017-07-04 DIAGNOSIS — Z0000 Encounter for general adult medical examination without abnormal findings: Secondary | ICD-10-CM | POA: Diagnosis not present

## 2017-07-04 DIAGNOSIS — Z8249 Family history of ischemic heart disease and other diseases of the circulatory system: Secondary | ICD-10-CM

## 2017-07-04 DIAGNOSIS — Z1231 Encounter for screening mammogram for malignant neoplasm of breast: Secondary | ICD-10-CM

## 2017-07-04 DIAGNOSIS — I10 Essential (primary) hypertension: Secondary | ICD-10-CM

## 2017-07-04 DIAGNOSIS — Z1211 Encounter for screening for malignant neoplasm of colon: Secondary | ICD-10-CM | POA: Diagnosis not present

## 2017-07-04 DIAGNOSIS — Z114 Encounter for screening for human immunodeficiency virus [HIV]: Secondary | ICD-10-CM | POA: Diagnosis not present

## 2017-07-04 DIAGNOSIS — E039 Hypothyroidism, unspecified: Secondary | ICD-10-CM | POA: Diagnosis not present

## 2017-07-04 DIAGNOSIS — E7800 Pure hypercholesterolemia, unspecified: Secondary | ICD-10-CM

## 2017-07-04 DIAGNOSIS — Z1239 Encounter for other screening for malignant neoplasm of breast: Secondary | ICD-10-CM

## 2017-07-04 MED ORDER — ATORVASTATIN CALCIUM 20 MG PO TABS: ORAL_TABLET | ORAL | 3 refills | Status: DC

## 2017-07-04 MED ORDER — LEVOTHYROXINE SODIUM 112 MCG PO TABS: ORAL_TABLET | ORAL | 3 refills | Status: DC

## 2017-07-04 MED ORDER — LISINOPRIL 40 MG PO TABS: 40.0000 mg | ORAL_TABLET | Freq: Every day | ORAL | 3 refills | Status: DC

## 2017-07-04 MED ORDER — HYDROCHLOROTHIAZIDE 12.5 MG PO TABS: 6.2500 mg | ORAL_TABLET | Freq: Every day | ORAL | 3 refills | Status: DC

## 2017-07-04 NOTE — Progress Notes (Signed)
[name], Female    DOB: 08/21/1940, 77 y.o.   MRN: [id number] Visit Date: 07/04/2017  Today's Provider: Mar Daring, PA-C   Chief Complaint  Patient presents with  . Annual Exam  . Hypertension   Subjective:     Complete Physical Mary Kennedy is a 77 y.o. female. She feels well. She reports exercising none. She reports she is sleeping poorly.  Mammogram:07/24/16 BI-RADS 1 BMD:06/20/10 Normal ----------------------------------------------------------- On 06/24/17 Patient had AWE with [facility]. Nurse Recommendations: Pt declines colonoscopy referral. Pt would like to speak with PCP further about trying out the cologuard. Information was given. B/P was elevated and pt was recommend to check her BP twice daily and record readings. Pt to bring record. Patient did bring her BP record. Reports that her BP medication was lowered since she had lost weight at one point. She reports that since her office visit on 02/11 she took another additional 1/2  Of Lisinopril 20mg  that afternoon and since 02/12 she is taking 2 tablets (40mg ) instead of 20 mg.  Review of Systems  Constitutional: Positive for activity change, appetite change and fatigue.       Irritable  HENT: Positive for congestion, postnasal drip, rhinorrhea, sinus pressure, sneezing and tinnitus.   Eyes: Positive for itching and visual disturbance.  Respiratory: Positive for shortness of breath.   Cardiovascular: Negative.   Gastrointestinal: Negative.   Endocrine: Positive for polydipsia and polyuria.  Genitourinary: Negative.   Musculoskeletal: Positive for arthralgias, back pain, myalgias, neck pain and neck stiffness.  Skin: Negative.   Allergic/Immunologic: Negative.   Neurological: Negative.   Hematological: Negative.   Psychiatric/Behavioral: Positive for agitation, decreased concentration and sleep disturbance.    Social History   Socioeconomic History  . Marital status: Divorced    Spouse name: Not on file  . Number of children: 2  . Years of education: Not on file  . Highest education level: Some college, no degree  Social Needs  . Financial resource strain: Not hard at all  . Food insecurity - worry: Never true  . Food insecurity - inability: Never true  . Transportation needs - medical: No  . Transportation needs - non-medical: No  Occupational History  . Occupation: Retired    Comment: working part-time  Tobacco Use  . Smoking status: Former Smoker    Types: Cigarettes    Last attempt to quit: 05/13/1996    Years since quitting: 21.1  . Smokeless tobacco: Never Used  Substance and Sexual Activity  . Alcohol use: Yes    Alcohol/week: 0.0 - 0.6 oz    Comment: 1 glass of wine occasionally   . Drug use: No  . Sexual activity: Not on file  Other Topics Concern  . Not on file  Social History Narrative  . Not on file    Past Medical History:  Diagnosis Date  . Hyperlipidemia   . Hypertension   . Thyroid disease      Patient Active Problem List   Diagnosis Date Noted  . Bradycardia 04/25/2015  . H/O cardiovascular disorder 12/01/2014  . LBP (low back pain) 12/01/2014  . Adenopathy 12/01/2014  . Atypical squamous cells of undetermined significance on cytologic smear of vagina (ASC-US) 12/01/2014  . Adenitis, salivary, recurring 12/01/2014  . Absence of bladder continence 12/01/2014  . Bursitis of knee 10/21/2013  . Arthritis of knee, degenerative 09/30/2013  . Carotid artery obstruction 04/20/2009  . Urinary system disease 10/19/2008  . Obesity,  Class III, BMI 40-49.9 (morbid obesity) ([place]) 10/19/2008  . Family history of cardiovascular disease 07/06/2007  . Undiagnosed cardiac murmurs 02/23/2007  . History of colon polyps 02/23/2007  . Benign hypertension 02/23/2007  . Bladder compliance low 02/23/2007  . Acne erythematosa 02/23/2007  . Menopausal symptom 02/23/2007  . Hyperlipidemia 02/19/2007  . Adult hypothyroidism 02/19/2007     Past Surgical History:  Procedure Laterality Date  . ABDOMINAL HYSTERECTOMY  1961   BIL Oophorectomy  . APPENDECTOMY  at age 17  . BILATERAL CARPAL TUNNEL RELEASE  1999  . CARDIAC CATHETERIZATION    . FOOT SURGERY Bilateral   . TONSILLECTOMY  at age 10    Her family history includes Breast cancer in her cousin; Drug abuse in her brother; Heart attack in her father; Lung cancer in her brother; Pancreatitis in her mother.      Current Outpatient Medications:  .  acetaminophen (TYLENOL) 650 MG CR tablet, Take 650 mg by mouth every 8 (eight) hours as needed for pain., Disp: , Rfl:  .  aspirin 81 MG tablet, Take 1 tablet by mouth every other day., Disp: , Rfl:  .  atorvastatin (LIPITOR) 20 MG tablet, TAKE 1 TABLET BY MOUTH AT  BEDTIME FOR CHOLESTEROL, Disp: 90 tablet, Rfl: 1 .  B Complex CAPS, Take 1 capsule by mouth daily., Disp: , Rfl:  .  Biotin (BIOTIN 5000) 5 MG CAPS, Take 5 mg by mouth., Disp: , Rfl:  .  Calcium Carbonate-Vitamin D 600-200 MG-UNIT TABS, Take 2 tablets by mouth daily., Disp: , Rfl:  .  Cholecalciferol (VITAMIN D3) 1000 UNITS CAPS, Take by mouth., Disp: , Rfl:  .  Chromium Picolinate 1000 MCG TABS, Take 1 tablet by mouth daily., Disp: , Rfl:  .  Coenzyme Q10 100 MG TABS, Take 1 capsule by mouth daily., Disp: , Rfl:  .  Cyanocobalamin (B-12) 2500 MCG TABS, Take 2,500 mcg by mouth daily., Disp: , Rfl:  .  Glucos-Chond-Hyal Ac-Ca Fructo (MOVE FREE JOINT HEALTH ADVANCE) TABS, Take by mouth daily., Disp: , Rfl:  .  hydrochlorothiazide (HYDRODIURIL) 12.5 MG tablet, TAKE ONE-HALF TABLET BY  MOUTH DAILY, Disp: 45 tablet, Rfl: 1 .  levothyroxine (SYNTHROID, LEVOTHROID) 112 MCG tablet, TAKE 1 TABLET BY MOUTH  DAILY BEFORE BREAKFAST, Disp: 90 tablet, Rfl: 1 .  lisinopril (PRINIVIL,ZESTRIL) 20 MG tablet, TAKE 1 TABLET BY MOUTH  DAILY, Disp: 90 tablet, Rfl: 1 .  Magnesium 250 MG TABS, Take 1 tablet (250 mg total) by mouth daily. (Patient taking differently: Take 400 mg by mouth  daily. ), Disp: 30 tablet, Rfl: 0 .  MULTIPLE VITAMIN PO, Take 1 tablet by mouth daily. , Disp: , Rfl:  .  Multiple Vitamins-Minerals (ICAPS AREDS 2 PO), Take by mouth daily., Disp: , Rfl:  .  naproxen sodium (ALEVE) 220 MG tablet, Take 220 mg by mouth daily as needed., Disp: , Rfl:  .  Omega-3 Fatty Acids (FISH OIL EXTRA STRENGTH) 1200 MG CAPS, Take 1 capsule by mouth daily., Disp: , Rfl:  .  simethicone (MYLICON) 030 MG chewable tablet, Chew 125 mg by mouth daily., Disp: , Rfl:  .  triamcinolone cream (KENALOG) 0.1 %, Apply 1 application topically 2 (two) times daily., Disp: 80 g, Rfl: 1 .  Zinc 50 MG CAPS, Take 50 mg by mouth daily., Disp: , Rfl:   Patient Care Team: Rubye Beach as PCP - General (Family Medicine) Birder Robson, MD as Referring Physician (Ophthalmology) Yolonda Kida, MD as Consulting  Physician (Cardiology)     Objective:   Vitals: BP 130/70 (BP Location: Left Wrist, Patient Position: Sitting, Cuff Size: Normal)   Pulse 67   Temp 97.7 F (36.5 C) (Oral)   Resp 16   Ht 5\' 5"  (1.651 m)   Wt 259 lb (117.5 kg)   BMI 43.10 kg/m   Physical Exam  Constitutional: She is oriented to person, place, and time. She appears well-developed and well-nourished.  HENT:  Head: Normocephalic and atraumatic.  Right Ear: External ear normal.  Left Ear: External ear normal.  Nose: Nose normal.  Mouth/Throat: Oropharynx is clear and moist.  Eyes: Conjunctivae and EOM are normal. Pupils are equal, round, and reactive to light.  Neck: Normal range of motion. Neck supple.  Cardiovascular: Normal rate, regular rhythm, normal heart sounds and intact distal pulses.  Pulmonary/Chest: Effort normal and breath sounds normal.  Abdominal: Soft. Bowel sounds are normal.  Musculoskeletal: Normal range of motion.  Neurological: She is alert and oriented to person, place, and time. She has normal reflexes.  Skin: Skin is warm and dry.  Psychiatric: She has a normal mood  and affect. Her behavior is normal. Judgment and thought content normal.    Activities of Daily Living In your present state of health, do you have any difficulty performing the following activities: 06/24/2017  Hearing? N  Vision? N  Difficulty concentrating or making decisions? N  Walking or climbing stairs? N  Dressing or bathing? N  Doing errands, shopping? N  Preparing Food and eating ? N  Using the Toilet? N  In the past six months, have you accidently leaked urine? N  Do you have problems with loss of bowel control? N  Managing your Medications? N  Managing your Finances? N  Housekeeping or managing your Housekeeping? N  Some recent data might be hidden    Fall Risk Assessment Fall Risk  06/24/2017 06/26/2016 06/23/2015  Falls in the past year? No No No     Depression Screen PHQ 2/9 Scores 06/24/2017 06/26/2016 06/23/2015  PHQ - 2 Score 0 0 0    6CIT Screen 06/24/2017  What Year? 0 points  What month? 0 points  What time? 0 points  Count back from 20 0 points  Months in reverse 0 points  Repeat phrase 0 points  Total Score 0          Assessment & Plan:    Annual Physical Reviewed patient's Family Medical History Reviewed and updated list of patient's medical providers Assessment of cognitive impairment was done Assessed patient's functional ability Established a written schedule for health screening [place] Completed and Reviewed  Exercise Activities and Dietary recommendations Goals    . Exercise 3x per week (30 min per time)     Recommend some form of exercise 3 days a week for at least 30 minutes. Pt to start swimming and doing eccentrics for 3-4 days a week.        Immunization History  Administered Date(s) Administered  . Influenza Split 02/19/2007, 02/27/2011  . Influenza, High Dose Seasonal PF 02/09/2014, 03/15/2016  . Influenza-Unspecified 03/04/2015, 03/05/2017  . Pneumococcal Conjugate-13 03/22/2014  . Pneumococcal  Polysaccharide-23 11/03/2012  . Tdap 06/20/2010  . Zoster 06/20/2010    Health Maintenance  Topic Date Due  . COLONOSCOPY  06/01/2017  . TETANUS/TDAP  06/20/2020  . INFLUENZA VACCINE  Completed  . DEXA SCAN  Completed  . PNA vac Low Risk Adult  Completed     Discussed  health benefits of physical activity, and encouraged her to engage in regular exercise appropriate for her age and condition.    1. Annual physical exam Normal physical exam today. Will check labs as below and f/u pending lab results. If labs are stable and WNL she will not need to have these rechecked for one year at her next annual physical exam. She is to call the office in the meantime if she has any acute issue, questions or concerns.  2. Colon cancer screening - Cologuard  3. Breast cancer screening Breast exam today was normal. There is no family history of breast cancer. She does perform regular self breast exams. Mammogram was ordered as below. Information for [facility] was given to patient so she may schedule her mammogram at her convenience. - MM Digital Screening; Future  4. Benign hypertension Is currently taking lisinopril 40mg . BP has been jumping around since AWV until patient started the lisinopril 40mg , had previously been on 20mg . Once on 40mg  BP has been more consistent around 140/60-70s. However, she has had a few significant bradycardia events with HR noted in the upper 30s. She is only using a wrist cuff that shows this reading. Patient also has a known RBBB and h/o bradycardia. She does have a cardiologist, Dr. Clayborn Bigness, and was recently seen by him in Dec 2018 with echo and EKG. Advised if HR continues to run low may need to f/u with Dr. Clayborn Bigness sooner. She is in agreement.  - CBC with Differential/Platelet - Comprehensive metabolic panel - Lipid panel - Hemoglobin A1c - lisinopril (PRINIVIL,ZESTRIL) 40 MG tablet; Take 1 tablet (40 mg total) by mouth daily.  Dispense: 90 tablet;  Refill: 3 - hydrochlorothiazide (HYDRODIURIL) 12.5 MG tablet; Take 0.5 tablets (6.25 mg total) by mouth daily.  Dispense: 45 tablet; Refill: 3  5. Adult hypothyroidism Stable. Diagnosis pulled for medication refill. Continue current medical treatment plan. Will check labs as below and f/u pending results. - Comprehensive metabolic panel - Hemoglobin A1c - TSH - levothyroxine (SYNTHROID, LEVOTHROID) 112 MCG tablet; TAKE 1 TABLET BY MOUTH  DAILY BEFORE BREAKFAST  Dispense: 90 tablet; Refill: 3  6. Family history of cardiovascular disease  7. Pure hypercholesterolemia Stable. Diagnosis pulled for medication refill. Continue current medical treatment plan. Will check labs as below and f/u pending results. - Comprehensive metabolic panel - Lipid panel - Hemoglobin A1c - atorvastatin (LIPITOR) 20 MG tablet; TAKE 1 TABLET BY MOUTH AT  BEDTIME FOR CHOLESTEROL  Dispense: 90 tablet; Refill: 3  8. Bradycardia See above medical treatment plan for #4. - Comprehensive metabolic panel  9. Screening for HIV without presence of risk factors - HIV antibody (with reflex)  ------------------------------------------------------------------------------------------------------------    Mar Daring, PA-C  [facility]

## 2017-07-04 NOTE — Patient Instructions (Signed)
Health Maintenance for Postmenopausal Women Menopause is a normal process in which your reproductive ability comes to an end. This process happens gradually over a span of months to years, usually between the ages of 22 and 9. Menopause is complete when you have missed 12 consecutive menstrual periods. It is important to talk with your health care provider about some of the most common conditions that affect postmenopausal women, such as heart disease, cancer, and bone loss (osteoporosis). Adopting a healthy lifestyle and getting preventive care can help to promote your health and wellness. Those actions can also lower your chances of developing some of these common conditions. What should I know about menopause? During menopause, you may experience a number of symptoms, such as:  Moderate-to-severe hot flashes.  Night sweats.  Decrease in sex drive.  Mood swings.  Headaches.  Tiredness.  Irritability.  Memory problems.  Insomnia.  Choosing to treat or not to treat menopausal changes is an individual decision that you make with your health care provider. What should I know about hormone replacement therapy and supplements? Hormone therapy products are effective for treating symptoms that are associated with menopause, such as hot flashes and night sweats. Hormone replacement carries certain risks, especially as you become older. If you are thinking about using estrogen or estrogen with progestin treatments, discuss the benefits and risks with your health care provider. What should I know about heart disease and stroke? Heart disease, heart attack, and stroke become more likely as you age. This may be due, in part, to the hormonal changes that your body experiences during menopause. These can affect how your body processes dietary fats, triglycerides, and cholesterol. Heart attack and stroke are both medical emergencies. There are many things that you can do to help prevent heart disease  and stroke:  Have your blood pressure checked at least every 1-2 years. High blood pressure causes heart disease and increases the risk of stroke.  If you are 53-22 years old, ask your health care provider if you should take aspirin to prevent a heart attack or a stroke.  Do not use any tobacco products, including cigarettes, chewing tobacco, or electronic cigarettes. If you need help quitting, ask your health care provider.  It is important to eat a healthy diet and maintain a healthy weight. ? Be sure to include plenty of vegetables, fruits, low-fat dairy products, and lean protein. ? Avoid eating foods that are high in solid fats, added sugars, or salt (sodium).  Get regular exercise. This is one of the most important things that you can do for your health. ? Try to exercise for at least 150 minutes each week. The type of exercise that you do should increase your heart rate and make you sweat. This is known as moderate-intensity exercise. ? Try to do strengthening exercises at least twice each week. Do these in addition to the moderate-intensity exercise.  Know your numbers.Ask your health care provider to check your cholesterol and your blood glucose. Continue to have your blood tested as directed by your health care provider.  What should I know about cancer screening? There are several types of cancer. Take the following steps to reduce your risk and to catch any cancer development as early as possible. Breast Cancer  Practice breast self-awareness. ? This means understanding how your breasts normally appear and feel. ? It also means doing regular breast self-exams. Let your health care provider know about any changes, no matter how small.  If you are 40  or older, have a clinician do a breast exam (clinical breast exam or CBE) every year. Depending on your age, family history, and medical history, it may be recommended that you also have a yearly breast X-ray (mammogram).  If you  have a family history of breast cancer, talk with your health care provider about genetic screening.  If you are at high risk for breast cancer, talk with your health care provider about having an MRI and a mammogram every year.  Breast cancer (BRCA) gene test is recommended for women who have family members with BRCA-related cancers. Results of the assessment will determine the need for genetic counseling and BRCA1 and for BRCA2 testing. BRCA-related cancers include these types: ? Breast. This occurs in males or females. ? Ovarian. ? Tubal. This may also be called fallopian tube cancer. ? Cancer of the abdominal or pelvic lining (peritoneal cancer). ? Prostate. ? Pancreatic.  Cervical, Uterine, and Ovarian Cancer Your health care provider may recommend that you be screened regularly for cancer of the pelvic organs. These include your ovaries, uterus, and vagina. This screening involves a pelvic exam, which includes checking for microscopic changes to the surface of your cervix (Pap test).  For women ages 21-65, health care providers may recommend a pelvic exam and a Pap test every three years. For women ages 79-65, they may recommend the Pap test and pelvic exam, combined with testing for human papilloma virus (HPV), every five years. Some types of HPV increase your risk of cervical cancer. Testing for HPV may also be done on women of any age who have unclear Pap test results.  Other health care providers may not recommend any screening for nonpregnant women who are considered low risk for pelvic cancer and have no symptoms. Ask your health care provider if a screening pelvic exam is right for you.  If you have had past treatment for cervical cancer or a condition that could lead to cancer, you need Pap tests and screening for cancer for at least 20 years after your treatment. If Pap tests have been discontinued for you, your risk factors (such as having a new sexual partner) need to be  reassessed to determine if you should start having screenings again. Some women have medical problems that increase the chance of getting cervical cancer. In these cases, your health care provider may recommend that you have screening and Pap tests more often.  If you have a family history of uterine cancer or ovarian cancer, talk with your health care provider about genetic screening.  If you have vaginal bleeding after reaching menopause, tell your health care provider.  There are currently no reliable tests available to screen for ovarian cancer.  Lung Cancer Lung cancer screening is recommended for adults 69-62 years old who are at high risk for lung cancer because of a history of smoking. A yearly low-dose CT scan of the lungs is recommended if you:  Currently smoke.  Have a history of at least 30 pack-years of smoking and you currently smoke or have quit within the past 15 years. A pack-year is smoking an average of one pack of cigarettes per day for one year.  Yearly screening should:  Continue until it has been 15 years since you quit.  Stop if you develop a health problem that would prevent you from having lung cancer treatment.  Colorectal Cancer  This type of cancer can be detected and can often be prevented.  Routine colorectal cancer screening usually begins at  age 42 and continues through age 45.  If you have risk factors for colon cancer, your health care provider may recommend that you be screened at an earlier age.  If you have a family history of colorectal cancer, talk with your health care provider about genetic screening.  Your health care provider may also recommend using home test kits to check for hidden blood in your stool.  A small camera at the end of a tube can be used to examine your colon directly (sigmoidoscopy or colonoscopy). This is done to check for the earliest forms of colorectal cancer.  Direct examination of the colon should be repeated every  5-10 years until age 71. However, if early forms of precancerous polyps or small growths are found or if you have a family history or genetic risk for colorectal cancer, you may need to be screened more often.  Skin Cancer  Check your skin from head to toe regularly.  Monitor any moles. Be sure to tell your health care provider: ? About any new moles or changes in moles, especially if there is a change in a mole's shape or color. ? If you have a mole that is larger than the size of a pencil eraser.  If any of your family members has a history of skin cancer, especially at a young age, talk with your health care provider about genetic screening.  Always use sunscreen. Apply sunscreen liberally and repeatedly throughout the day.  Whenever you are outside, protect yourself by wearing long sleeves, pants, a wide-brimmed hat, and sunglasses.  What should I know about osteoporosis? Osteoporosis is a condition in which bone destruction happens more quickly than new bone creation. After menopause, you may be at an increased risk for osteoporosis. To help prevent osteoporosis or the bone fractures that can happen because of osteoporosis, the following is recommended:  If you are 46-71 years old, get at least 1,000 mg of calcium and at least 600 mg of vitamin D per day.  If you are older than age 55 but younger than age 65, get at least 1,200 mg of calcium and at least 600 mg of vitamin D per day.  If you are older than age 54, get at least 1,200 mg of calcium and at least 800 mg of vitamin D per day.  Smoking and excessive alcohol intake increase the risk of osteoporosis. Eat foods that are rich in calcium and vitamin D, and do weight-bearing exercises several times each week as directed by your health care provider. What should I know about how menopause affects my mental health? Depression may occur at any age, but it is more common as you become older. Common symptoms of depression  include:  Low or sad mood.  Changes in sleep patterns.  Changes in appetite or eating patterns.  Feeling an overall lack of motivation or enjoyment of activities that you previously enjoyed.  Frequent crying spells.  Talk with your health care provider if you think that you are experiencing depression. What should I know about immunizations? It is important that you get and maintain your immunizations. These include:  Tetanus, diphtheria, and pertussis (Tdap) booster vaccine.  Influenza every year before the flu season begins.  Pneumonia vaccine.  Shingles vaccine.  Your health care provider may also recommend other immunizations. This information is not intended to replace advice given to you by your health care provider. Make sure you discuss any questions you have with your health care provider. Document Released: 06/22/2005  Document Revised: 11/18/2015 Document Reviewed: 02/01/2015 Elsevier Interactive Patient Education  2018 [employer]vier Inc.  

## 2017-07-09 ENCOUNTER — Telehealth: Payer: Self-pay | Admitting: Physician Assistant

## 2017-07-09 NOTE — Telephone Encounter (Signed)
Order for cologuard faxed to [facility] °

## 2017-07-12 LAB — CBC WITH DIFFERENTIAL/PLATELET
BASOS: 1 %
Basophils Absolute: 0.1 10*3/uL (ref 0.0–0.2)
EOS (ABSOLUTE): 0.4 10*3/uL (ref 0.0–0.4)
Eos: 6 %
Hematocrit: 41 % (ref 34.0–46.6)
Hemoglobin: 13.5 g/dL (ref 11.1–15.9)
IMMATURE GRANS (ABS): 0 10*3/uL (ref 0.0–0.1)
Immature Granulocytes: 0 %
LYMPHS: 45 %
Lymphocytes Absolute: 2.8 10*3/uL (ref 0.7–3.1)
MCH: 31 pg (ref 26.6–33.0)
MCHC: 32.9 g/dL (ref 31.5–35.7)
MCV: 94 fL (ref 79–97)
MONOS ABS: 0.7 10*3/uL (ref 0.1–0.9)
Monocytes: 11 %
Neutrophils Absolute: 2.3 10*3/uL (ref 1.4–7.0)
Neutrophils: 37 %
PLATELETS: 228 10*3/uL (ref 150–379)
RBC: 4.35 x10E6/uL (ref 3.77–5.28)
RDW: 13.9 % (ref 12.3–15.4)
WBC: 6.2 10*3/uL (ref 3.4–10.8)

## 2017-07-12 LAB — COMPREHENSIVE METABOLIC PANEL
ALT: 24 IU/L (ref 0–32)
AST: 31 IU/L (ref 0–40)
Albumin/Globulin Ratio: 1.9 (ref 1.2–2.2)
Albumin: 4.1 g/dL (ref 3.5–4.8)
Alkaline Phosphatase: 62 IU/L (ref 39–117)
BILIRUBIN TOTAL: 0.5 mg/dL (ref 0.0–1.2)
BUN / CREAT RATIO: 19 (ref 12–28)
BUN: 23 mg/dL (ref 8–27)
CHLORIDE: 104 mmol/L (ref 96–106)
CO2: 26 mmol/L (ref 20–29)
Calcium: 9.9 mg/dL (ref 8.7–10.3)
Creatinine, Ser: 1.19 mg/dL — ABNORMAL HIGH (ref 0.57–1.00)
GFR calc non Af Amer: 45 mL/min/{1.73_m2} — ABNORMAL LOW (ref >59)
GFR, EST AFRICAN AMERICAN: 52 mL/min/{1.73_m2} — AB (ref >59)
Globulin, Total: 2.2 g/dL (ref 1.5–4.5)
Glucose: 93 mg/dL (ref 65–99)
Potassium: 4.6 mmol/L (ref 3.5–5.2)
Sodium: 144 mmol/L (ref 134–144)
TOTAL PROTEIN: 6.3 g/dL (ref 6.0–8.5)

## 2017-07-12 LAB — HEMOGLOBIN A1C
ESTIMATED AVERAGE GLUCOSE: 117 mg/dL
HEMOGLOBIN A1C: 5.7 % — AB (ref 4.8–5.6)

## 2017-07-12 LAB — LIPID PANEL
Chol/HDL Ratio: 3.4 ratio (ref 0.0–4.4)
Cholesterol, Total: 153 mg/dL (ref 100–199)
HDL: 45 mg/dL (ref >39)
LDL Calculated: 94 mg/dL (ref 0–99)
Triglycerides: 72 mg/dL (ref 0–149)
VLDL Cholesterol Cal: 14 mg/dL (ref 5–40)

## 2017-07-12 LAB — HIV ANTIBODY (ROUTINE TESTING W REFLEX): HIV Screen 4th Generation wRfx: NONREACTIVE

## 2017-07-12 LAB — TSH: TSH: 4.46 u[IU]/mL (ref 0.450–4.500)

## 2017-07-13 ENCOUNTER — Telehealth: Payer: Self-pay

## 2017-07-13 DIAGNOSIS — L309 Dermatitis, unspecified: Secondary | ICD-10-CM

## 2017-07-13 NOTE — Telephone Encounter (Signed)
-----   Message from Mar Daring, PA-C sent at 07/12/2017  5:11 PM EST ----- All labs are within normal limits and stable.  Some dehydration noted again. Make sure to push fluids. Thanks! -JB

## 2017-07-13 NOTE — Telephone Encounter (Signed)
Patient advised as directed below. Patient also request a refill for her Triamcinolone cream 0.01% Qty:90

## 2017-07-15 MED ORDER — TRIAMCINOLONE ACETONIDE 0.1 % EX CREA: 1.0000 "application " | TOPICAL_CREAM | Freq: Two times a day (BID) | CUTANEOUS | 1 refills | Status: DC

## 2017-07-15 NOTE — Telephone Encounter (Signed)
Triamcinolone sent to [facility]

## 2017-07-20 LAB — COLOGUARD

## 2017-07-27 LAB — COLOGUARD: COLOGUARD: POSITIVE

## 2017-08-05 ENCOUNTER — Encounter: Payer: Self-pay | Admitting: Physician Assistant

## 2017-08-05 ENCOUNTER — Telehealth: Payer: Self-pay | Admitting: Physician Assistant

## 2017-08-05 DIAGNOSIS — R195 Other fecal abnormalities: Secondary | ICD-10-CM

## 2017-08-05 NOTE — Telephone Encounter (Signed)
Called patient in regards to cologuard result and left VM for patient to call back. I will also attempt to contact at the end of the day.

## 2017-08-06 ENCOUNTER — Telehealth: Payer: Self-pay | Admitting: Gastroenterology

## 2017-08-06 NOTE — Telephone Encounter (Signed)
Gastroenterology Pre-Procedure Review  Request Date:   Requesting Physician: Dr.    PATIENT REVIEW QUESTIONS: The patient responded to the following health history questions as indicated:    1. Are you having any GI issues? yes (kink in bowel) 2. Do you have a personal history of Polyps? yes (yes) 3. Do you have a family history of Colon Cancer or Polyps? yes (first cousin, aunt colon cancer) 4. Diabetes Mellitus? no 5. Joint replacements in the past 12 months?no 6. Major health problems in the past 3 months?no 7. Any artificial heart valves, MVP, or defibrillator?yes (leaking valve)    MEDICATIONS & ALLERGIES:    Patient reports the following regarding taking any anticoagulation/antiplatelet therapy:   Plavix, Coumadin, Eliquis, Xarelto, Lovenox, Pradaxa, Brilinta, or Effient? no Aspirin? yes (ASA daily)  Patient confirms/reports the following medications:  Current Outpatient Medications  Medication Sig Dispense Refill   acetaminophen (TYLENOL) 650 MG CR tablet Take 650 mg by mouth every 8 (eight) hours as needed for pain.     aspirin 81 MG tablet Take 1 tablet by mouth every other day.     atorvastatin (LIPITOR) 20 MG tablet TAKE 1 TABLET BY MOUTH AT  BEDTIME FOR CHOLESTEROL 90 tablet 3   B Complex CAPS Take 1 capsule by mouth daily.     Biotin (BIOTIN 5000) 5 MG CAPS Take 5 mg by mouth.     Calcium Carbonate-Vitamin D 600-200 MG-UNIT TABS Take 2 tablets by mouth daily.     Cholecalciferol (VITAMIN D3) 1000 UNITS CAPS Take by mouth.     Chromium Picolinate 1000 MCG TABS Take 1 tablet by mouth daily.     Coenzyme Q10 100 MG TABS Take 1 capsule by mouth daily.     Cyanocobalamin (B-12) 2500 MCG TABS Take 2,500 mcg by mouth daily.     Glucos-Chond-Hyal Ac-Ca Fructo (MOVE FREE JOINT HEALTH ADVANCE) TABS Take by mouth daily.     hydrochlorothiazide (HYDRODIURIL) 12.5 MG tablet Take 0.5 tablets (6.25 mg total) by mouth daily. 45 tablet 3   levothyroxine (SYNTHROID,  LEVOTHROID) 112 MCG tablet TAKE 1 TABLET BY MOUTH  DAILY BEFORE BREAKFAST 90 tablet 3   lisinopril (PRINIVIL,ZESTRIL) 40 MG tablet Take 1 tablet (40 mg total) by mouth daily. 90 tablet 3   Magnesium 250 MG TABS Take 1 tablet (250 mg total) by mouth daily. (Patient taking differently: Take 400 mg by mouth daily. ) 30 tablet 0   MULTIPLE VITAMIN PO Take 1 tablet by mouth daily.      Multiple Vitamins-Minerals (ICAPS AREDS 2 PO) Take by mouth daily.     naproxen sodium (ALEVE) 220 MG tablet Take 220 mg by mouth daily as needed.     Omega-3 Fatty Acids (FISH OIL EXTRA STRENGTH) 1200 MG CAPS Take 1 capsule by mouth daily.     simethicone (MYLICON) 244 MG chewable tablet Chew 125 mg by mouth daily.     triamcinolone cream (KENALOG) 0.1 % Apply 1 application topically 2 (two) times daily. 80 g 1   Zinc 50 MG CAPS Take 50 mg by mouth daily.     No current facility-administered medications for this visit.     Patient confirms/reports the following allergies:  Allergies  Allergen Reactions   Ciprofloxacin    Tegaderm Ag Mesh 2"X2"  [Wound Dressings]     when removing the patch her skin came off.   Penicillins Rash    No orders of the defined types were placed in this encounter.   AUTHORIZATION INFORMATION  Primary Insurance: 1D#: Group #:  Secondary Insurance: 1D#: Group #:  SCHEDULE INFORMATION: Date:  Time: Location:

## 2017-08-06 NOTE — Telephone Encounter (Signed)
Cologuard ordered

## 2017-08-06 NOTE — Addendum Note (Signed)
Addended by: Mar Daring on: 08/06/2017 08:45 AM   Modules accepted: Orders

## 2017-08-07 ENCOUNTER — Other Ambulatory Visit: Payer: Self-pay

## 2017-08-07 DIAGNOSIS — Z86010 Personal history of colonic polyps: Secondary | ICD-10-CM

## 2017-08-14 ENCOUNTER — Telehealth: Payer: Self-pay

## 2017-08-14 ENCOUNTER — Ambulatory Visit
Admission: RE | Admit: 2017-08-14 | Discharge: 2017-08-14 | Disposition: A | Discharge status: Home / Self Care | Payer: Medicare Other | Source: Ambulatory Visit | Attending: Physician Assistant | Admitting: Physician Assistant

## 2017-08-14 DIAGNOSIS — Z1231 Encounter for screening mammogram for malignant neoplasm of breast: Secondary | ICD-10-CM | POA: Diagnosis not present

## 2017-08-14 DIAGNOSIS — Z1239 Encounter for other screening for malignant neoplasm of breast: Secondary | ICD-10-CM

## 2017-08-14 NOTE — Telephone Encounter (Signed)
lmtcb

## 2017-08-14 NOTE — Telephone Encounter (Signed)
-----   Message from Mar Daring, [place] sent at 08/14/2017 12:32 PM EDT ----- Normal mammogram. Repeat screening in one year.

## 2017-08-14 NOTE — Telephone Encounter (Signed)
Pt.Advised. KW 

## 2017-08-26 ENCOUNTER — Encounter: Payer: Self-pay | Admitting: Student

## 2017-08-27 ENCOUNTER — Ambulatory Visit: Payer: Medicare Other | Admitting: Anesthesiology

## 2017-08-27 ENCOUNTER — Ambulatory Visit
Admission: RE | Admit: 2017-08-27 | Discharge: 2017-08-27 | Disposition: A | Discharge status: Home / Self Care | Payer: Medicare Other | Source: Ambulatory Visit | Attending: Gastroenterology | Admitting: Gastroenterology

## 2017-08-27 ENCOUNTER — Encounter: Payer: Self-pay | Admitting: *Deleted

## 2017-08-27 ENCOUNTER — Encounter
Admission: RE | Disposition: A | Discharge status: Home / Self Care | Payer: Self-pay | Source: Ambulatory Visit | Attending: Gastroenterology

## 2017-08-27 DIAGNOSIS — E785 Hyperlipidemia, unspecified: Secondary | ICD-10-CM | POA: Insufficient documentation

## 2017-08-27 DIAGNOSIS — Z79899 Other long term (current) drug therapy: Secondary | ICD-10-CM | POA: Diagnosis not present

## 2017-08-27 DIAGNOSIS — D122 Benign neoplasm of ascending colon: Secondary | ICD-10-CM | POA: Diagnosis not present

## 2017-08-27 DIAGNOSIS — Z79890 Hormone replacement therapy (postmenopausal): Secondary | ICD-10-CM | POA: Insufficient documentation

## 2017-08-27 DIAGNOSIS — Z860100 Personal history of colon polyps, unspecified: Secondary | ICD-10-CM

## 2017-08-27 DIAGNOSIS — Z7982 Long term (current) use of aspirin: Secondary | ICD-10-CM | POA: Diagnosis not present

## 2017-08-27 DIAGNOSIS — D120 Benign neoplasm of cecum: Secondary | ICD-10-CM

## 2017-08-27 DIAGNOSIS — K5730 Diverticulosis of large intestine without perforation or abscess without bleeding: Secondary | ICD-10-CM | POA: Insufficient documentation

## 2017-08-27 DIAGNOSIS — I10 Essential (primary) hypertension: Secondary | ICD-10-CM | POA: Diagnosis not present

## 2017-08-27 DIAGNOSIS — Z87891 Personal history of nicotine dependence: Secondary | ICD-10-CM | POA: Diagnosis not present

## 2017-08-27 DIAGNOSIS — Z1211 Encounter for screening for malignant neoplasm of colon: Secondary | ICD-10-CM | POA: Diagnosis not present

## 2017-08-27 DIAGNOSIS — E039 Hypothyroidism, unspecified: Secondary | ICD-10-CM | POA: Insufficient documentation

## 2017-08-27 HISTORY — DX: Cardiac murmur, unspecified: R01.1

## 2017-08-27 HISTORY — PX: COLONOSCOPY WITH PROPOFOL: SHX5780

## 2017-08-27 HISTORY — DX: Hypothyroidism, unspecified: E03.9

## 2017-08-27 SURGERY — COLONOSCOPY WITH PROPOFOL
Anesthesia: General

## 2017-08-27 MED ORDER — PROPOFOL 500 MG/50ML IV EMUL
INTRAVENOUS | Status: DC | PRN
  Administered 2017-08-27: 130 ug/kg/min via INTRAVENOUS

## 2017-08-27 MED ORDER — PROPOFOL 10 MG/ML IV BOLUS
INTRAVENOUS | Status: AC
  Filled 2017-08-27: qty 20

## 2017-08-27 MED ORDER — PROPOFOL 10 MG/ML IV BOLUS
INTRAVENOUS | Status: DC | PRN
  Administered 2017-08-27: 23 mg via INTRAVENOUS
  Administered 2017-08-27: 80 mg via INTRAVENOUS
  Administered 2017-08-27: 30 mg via INTRAVENOUS

## 2017-08-27 MED ORDER — SODIUM CHLORIDE 0.9 % IV SOLN
INTRAVENOUS | Status: DC
  Administered 2017-08-27: 1000 mL via INTRAVENOUS

## 2017-08-27 MED ORDER — LIDOCAINE HCL (CARDIAC) 20 MG/ML IV SOLN
INTRAVENOUS | Status: DC | PRN
  Administered 2017-08-27: 50 mg via INTRAVENOUS

## 2017-08-27 MED ORDER — PROPOFOL 500 MG/50ML IV EMUL
INTRAVENOUS | Status: AC
  Filled 2017-08-27: qty 50

## 2017-08-27 MED ORDER — LIDOCAINE HCL (PF) 2 % IJ SOLN
INTRAMUSCULAR | Status: AC
Start: 2017-08-27 — End: ?
  Filled 2017-08-27: qty 10

## 2017-08-27 NOTE — Anesthesia Post-op Follow-up Note (Signed)
Anesthesia QCDR form completed.        

## 2017-08-27 NOTE — Anesthesia Postprocedure Evaluation (Signed)
Anesthesia Post Note  [name]  Procedure(s) Performed: COLONOSCOPY WITH PROPOFOL (N/A )  Patient location during evaluation: Endoscopy Anesthesia Type: General Level of consciousness: awake and alert Pain management: pain level controlled Vital Signs Assessment: post-procedure vital signs reviewed and stable Respiratory status: spontaneous breathing and respiratory function stable Cardiovascular status: stable Anesthetic complications: no     Last Vitals:  Vitals:   08/27/17 0856 08/27/17 0906  BP: (!) 107/52 (!) 98/48  Pulse:    Resp:    Temp: (!) 36.2 C   SpO2:      Last Pain:  Vitals:   08/27/17 0906  TempSrc:   PainSc: 0-No pain                 KEPHART,WILLIAM K

## 2017-08-27 NOTE — H&P (Signed)
Lucilla Lame, MD [facility] [address] [place], [place] [address] [phone] Fax : [phone]  Primary Care Physician:  Mar Daring, PA-C Primary Gastroenterologist:  Dr. Allen Norris  Pre-Procedure History & Physical: HPI:  Mary Kennedy is a 77 y.o. female is here for an colonoscopy.   Past Medical History:  Diagnosis Date  . Heart murmur   . Hyperlipidemia   . Hypertension   . Hypothyroidism   . Thyroid disease     Past Surgical History:  Procedure Laterality Date  . ABDOMINAL HYSTERECTOMY  1961   BIL Oophorectomy  . APPENDECTOMY  at age 15  . BILATERAL CARPAL TUNNEL RELEASE  1999  . CARDIAC CATHETERIZATION    . FOOT SURGERY Bilateral   . TONSILLECTOMY  at age 43    Prior to Admission medications   Medication Sig Start Date End Date Taking? Authorizing Provider  acetaminophen (TYLENOL) 650 MG CR tablet Take 650 mg by mouth every 8 (eight) hours as needed for pain.    [provider]  aspirin 81 MG tablet Take 1 tablet by mouth every other day.    [provider]  atorvastatin (LIPITOR) 20 MG tablet TAKE 1 TABLET BY MOUTH AT  BEDTIME FOR CHOLESTEROL 07/04/17   Mar Daring, PA-C  B Complex CAPS Take 1 capsule by mouth daily.    [provider]  Biotin (BIOTIN 5000) 5 MG CAPS Take 5 mg by mouth.    [provider]  Calcium Carbonate-Vitamin D 600-200 MG-UNIT TABS Take 2 tablets by mouth daily. 10/19/08   [provider]  Cholecalciferol (VITAMIN D3) 1000 UNITS CAPS Take by mouth.    [provider]  Chromium Picolinate 1000 MCG TABS Take 1 tablet by mouth daily.    [provider]  Coenzyme Q10 100 MG TABS Take 1 capsule by mouth daily. 10/19/08   [provider]  Cyanocobalamin (B-12) 2500 MCG TABS Take 2,500 mcg by mouth daily.    [provider]  Glucos-Chond-Hyal Ac-Ca Fructo (MOVE FREE JOINT HEALTH ADVANCE) TABS Take by mouth daily.    [provider]    hydrochlorothiazide (HYDRODIURIL) 12.5 MG tablet Take 0.5 tablets (6.25 mg total) by mouth daily. 07/04/17   Mar Daring, PA-C  levothyroxine (SYNTHROID, LEVOTHROID) 112 MCG tablet TAKE 1 TABLET BY MOUTH  DAILY BEFORE BREAKFAST 07/04/17   Mar Daring, PA-C  lisinopril (PRINIVIL,ZESTRIL) 40 MG tablet Take 1 tablet (40 mg total) by mouth daily. 07/04/17   Mar Daring, PA-C  Magnesium 250 MG TABS Take 1 tablet (250 mg total) by mouth daily. Patient taking differently: Take 400 mg by mouth daily.  01/24/16   Mar Daring, PA-C  MULTIPLE VITAMIN PO Take 1 tablet by mouth daily.  04/20/08   [provider]  Multiple Vitamins-Minerals (ICAPS AREDS 2 PO) Take by mouth daily.    [provider]  naproxen sodium (ALEVE) 220 MG tablet Take 220 mg by mouth daily as needed.    [provider]  Omega-3 Fatty Acids (FISH OIL EXTRA STRENGTH) 1200 MG CAPS Take 1 capsule by mouth daily.    [provider]  simethicone (MYLICON) 712 MG chewable tablet Chew 125 mg by mouth daily.    [provider]  triamcinolone cream (KENALOG) 0.1 % Apply 1 application topically 2 (two) times daily. 07/15/17   Mar Daring, PA-C  Zinc 50 MG CAPS Take 50 mg by mouth daily.    [provider]  Allergies as of 08/07/2017 - Review Complete 07/04/2017  Allergen Reaction Noted  . Ciprofloxacin  12/01/2014  . Tegaderm ag mesh 2"x2"  [wound dressings]  12/01/2014  . Penicillins Rash 12/01/2014    Family History  Problem Relation Age of Onset  . Pancreatitis Mother   . Drug abuse Brother        PTSD  . Heart attack Father   . Lung cancer Brother   . Breast cancer Cousin        2nd cousin    Social History   Socioeconomic History  . Marital status: Divorced    Spouse name: Not on file  . Number of children: 2  . Years of education: Not on file  . Highest education level: Some college, no degree  Occupational History  .  Occupation: Retired    Comment: working Geographical information systems officer  . Financial resource strain: Not hard at all  . Food insecurity:    Worry: Never true    Inability: Never true  . Transportation needs:    Medical: No    Non-medical: No  Tobacco Use  . Smoking status: Former Smoker    Types: Cigarettes    Last attempt to quit: 05/13/1996    Years since quitting: 21.3  . Smokeless tobacco: Never Used  Substance and Sexual Activity  . Alcohol use: Yes    Alcohol/week: 0.0 - 0.6 oz    Comment: 1 glass of wine occasionally   . Drug use: No  . Sexual activity: Not on file  Lifestyle  . Physical activity:    Days per week: Not on file    Minutes per session: Not on file  . Stress: Not at all  Relationships  . Social connections:    Talks on phone: Not on file    Gets together: Not on file    Attends religious service: Not on file    Active member of club or organization: Not on file    Attends meetings of clubs or organizations: Not on file    Relationship status: Not on file  . Intimate partner violence:    Fear of current or ex partner: Not on file    Emotionally abused: Not on file    Physically abused: Not on file    Forced sexual activity: Not on file  Other Topics Concern  . Not on file  Social History Narrative  . Not on file    Review of Systems: See HPI, otherwise negative ROS  Physical Exam: BP (!) 151/89   Pulse 64   Temp (!) 97.1 F (36.2 C) (Tympanic)   Resp 20   Ht 5\' 5"  (1.651 m)   Wt 257 lb (116.6 kg)   SpO2 100%   BMI 42.77 kg/m  General:   Alert,  pleasant and cooperative in NAD Head:  Normocephalic and atraumatic. Neck:  Supple; no masses or thyromegaly. Lungs:  Clear throughout to auscultation.    Heart:  Regular rate and rhythm. Abdomen:  Soft, nontender and nondistended. Normal bowel sounds, without guarding, and without rebound.   Neurologic:  Alert and  oriented x4;  grossly normal neurologically.  Impression/Plan: Mary Kennedy  is here for an colonoscopy to be performed for history of colon polyps  Risks, benefits, limitations, and alternatives regarding  colonoscopy have been reviewed with the patient.  Questions have been answered.  All parties agreeable.   Lucilla Lame, MD  08/27/2017, 8:08 AM

## 2017-08-27 NOTE — Anesthesia Preprocedure Evaluation (Signed)
Anesthesia Evaluation  Patient identified by MRN, date of birth, ID band Patient awake    Reviewed: Allergy & Precautions, NPO status , Patient's Chart, lab work & pertinent test results  History of Anesthesia Complications Negative for: history of anesthetic complications  Airway Mallampati: III       Dental  (+) Upper Dentures, Partial Lower   Pulmonary neg sleep apnea, neg COPD, former smoker,           Cardiovascular hypertension, Pt. on medications (-) Past MI and (-) CHF (-) dysrhythmias (-) Valvular Problems/Murmurs     Neuro/Psych neg Seizures    GI/Hepatic Neg liver ROS, neg GERD  ,  Endo/Other  neg diabetesHypothyroidism   Renal/GU negative Renal ROS     Musculoskeletal   Abdominal   Peds  Hematology   Anesthesia Other Findings   Reproductive/Obstetrics                             Anesthesia Physical Anesthesia Plan  ASA: III  Anesthesia Plan: General   Post-op Pain Management:    Induction: Intravenous  PONV Risk Score and Plan: 3 and Propofol infusion, TIVA and Midazolam  Airway Management Planned: Nasal Cannula  Additional Equipment:   Intra-op Plan:   Post-operative Plan:   Informed Consent: I have reviewed the patients History and Physical, chart, labs and discussed the procedure including the risks, benefits and alternatives for the proposed anesthesia with the patient or authorized representative who has indicated his/her understanding and acceptance.     Plan Discussed with:   Anesthesia Plan Comments:         Anesthesia Quick Evaluation

## 2017-08-27 NOTE — Op Note (Signed)
[facility] Gastroenterology Patient Name: Mary Kennedy Procedure Date: 08/27/2017 7:29 AM MRN: [id number] Account #: [id number] Date of Birth: 02/22/1941 Admit Type: Outpatient Age: 77 Room: [facility] ENDO ROOM 4 Gender: Female Note Status: Finalized Procedure:            Colonoscopy Indications:          High risk colon cancer surveillance: Personal history                        of colonic polyps Providers:            Lucilla Lame MD, MD Medicines:            Propofol per Anesthesia Complications:        No immediate complications. Procedure:            Pre-Anesthesia Assessment:                       - Prior to the procedure, a History and Physical was                        performed, and patient medications and allergies were                        reviewed. The patient's tolerance of previous                        anesthesia was also reviewed. The risks and benefits of                        the procedure and the sedation options and risks were                        discussed with the patient. All questions were                        answered, and informed consent was obtained. Prior                        Anticoagulants: The patient has taken no previous                        anticoagulant or antiplatelet agents. ASA Grade                        Assessment: II - A patient with mild systemic disease.                        After reviewing the risks and benefits, the patient was                        deemed in satisfactory condition to undergo the                        procedure.                       After obtaining informed consent, the colonoscope was                        passed under direct vision. Throughout the  procedure,                        the patient's blood pressure, pulse, and oxygen                        saturations were monitored continuously. The                        Colonoscope was introduced through the anus and       advanced to the the cecum, identified by appendiceal                        orifice and ileocecal valve. The colonoscopy was                        performed without difficulty. The patient tolerated the                        procedure well. The quality of the bowel preparation                        was excellent. Findings:      The perianal and digital rectal examinations were normal.      A 9 mm polyp was found in the cecum. The polyp was sessile. The polyp       was removed with a hot snare. Resection and retrieval were complete. To       prevent bleeding post-intervention, two hemostatic clips were       successfully placed (MR conditional). There was no bleeding at the end       of the procedure.      A 6 mm polyp was found in the cecum. The polyp was sessile. The polyp       was removed with a cold snare. Resection and retrieval were complete.      Two sessile polyps were found in the ascending colon. The polyps were 7       to 10 mm in size. These polyps were removed with a hot snare. Resection       and retrieval were complete. To prevent bleeding post-intervention,       three hemostatic clips were successfully placed (MR conditional). There       was no bleeding at the end of the procedure.      Multiple small-mouthed diverticula were found in the sigmoid colon. Impression:           - One 9 mm polyp in the cecum, removed with a hot                        snare. Resected and retrieved. Clips (MR conditional)                        were placed.                       - One 6 mm polyp in the cecum, removed with a cold                        snare. Resected and retrieved.                       -  Two 7 to 10 mm polyps in the ascending colon, removed                        with a hot snare. Resected and retrieved. Clips (MR                        conditional) were placed.                       - Diverticulosis in the sigmoid colon. Recommendation:       - Discharge patient to  home.                       - Resume previous diet.                       - Continue present medications.                       - Await pathology results. Procedure Code(s):    --- Professional ---                       [phone], Colonoscopy, flexible; with removal of tumor(s),                        polyp(s), or other lesion(s) by snare technique Diagnosis Code(s):    --- Professional ---                       Z86.010, Personal history of colonic polyps                       D12.0, Benign neoplasm of cecum                       D12.2, Benign neoplasm of ascending colon CPT copyright 2017 American Medical Association. All rights reserved. The codes documented in this report are preliminary and upon coder review may  be revised to meet current compliance requirements. Lucilla Lame MD, MD 08/27/2017 8:54:55 AM This report has been signed electronically. Number of Addenda: 0 Note Initiated On: 08/27/2017 7:29 AM Scope Withdrawal Time: 0 hours 24 minutes 10 seconds  Total Procedure Duration: 0 hours 34 minutes 6 seconds       [facility]

## 2017-08-27 NOTE — Transfer of Care (Signed)
Immediate Anesthesia Transfer of Care Note  Patient: FRAYDA EGLEY  Procedure(s) Performed: COLONOSCOPY WITH PROPOFOL (N/A )  Patient Location: PACU and Endoscopy Unit  Anesthesia Type:General  Level of Consciousness: drowsy  Airway & Oxygen Therapy: Patient Spontanous Breathing and Patient connected to nasal cannula oxygen  Post-op Assessment: Report given to RN and Post -op Vital signs reviewed and stable  Post vital signs: Reviewed and stable  Last Vitals:  Vitals Value Taken Time  BP 107/52 08/27/2017  8:57 AM  Temp 36.2 C 08/27/2017  8:56 AM  Pulse 66 08/27/2017  8:58 AM  Resp 23 08/27/2017  8:58 AM  SpO2 99 % 08/27/2017  8:58 AM  Vitals shown include unvalidated device data.  Last Pain:  Vitals:   08/27/17 0856  TempSrc: Tympanic  PainSc: Asleep         Complications: No apparent anesthesia complications

## 2017-08-28 ENCOUNTER — Encounter: Payer: Self-pay | Admitting: Gastroenterology

## 2017-08-29 ENCOUNTER — Encounter: Payer: Self-pay | Admitting: Gastroenterology

## 2017-08-29 LAB — SURGICAL PATHOLOGY

## 2017-09-02 ENCOUNTER — Telehealth: Payer: Self-pay | Admitting: Physician Assistant

## 2017-09-02 NOTE — Telephone Encounter (Signed)
Called and spoke with patient and answered all questions.

## 2017-09-02 NOTE — Telephone Encounter (Signed)
Pt has questions regarding her colonoscopy and the polyps.  She states she also is calling concerning ASC.  Pt is requesting a call back from provider.

## 2017-09-02 NOTE — Telephone Encounter (Signed)
Please review. Thanks!  

## 2017-12-26 ENCOUNTER — Ambulatory Visit (INDEPENDENT_AMBULATORY_CARE_PROVIDER_SITE_OTHER): Payer: Medicare Other | Admitting: Family Medicine

## 2017-12-26 ENCOUNTER — Encounter: Payer: Self-pay | Admitting: Family Medicine

## 2017-12-26 ENCOUNTER — Emergency Department
Admission: EM | Admit: 2017-12-26 | Discharge: 2017-12-26 | Disposition: A | Discharge status: Home / Self Care | Payer: Medicare Other | Attending: Emergency Medicine | Admitting: Emergency Medicine

## 2017-12-26 ENCOUNTER — Emergency Department: Payer: Medicare Other

## 2017-12-26 ENCOUNTER — Other Ambulatory Visit: Payer: Self-pay

## 2017-12-26 VITALS — BP 136/60 | HR 32 | Temp 98.0°F | Resp 16 | Wt 271.0 lb

## 2017-12-26 DIAGNOSIS — E039 Hypothyroidism, unspecified: Secondary | ICD-10-CM | POA: Insufficient documentation

## 2017-12-26 DIAGNOSIS — R61 Generalized hyperhidrosis: Secondary | ICD-10-CM | POA: Diagnosis not present

## 2017-12-26 DIAGNOSIS — I493 Ventricular premature depolarization: Secondary | ICD-10-CM

## 2017-12-26 DIAGNOSIS — R0602 Shortness of breath: Secondary | ICD-10-CM

## 2017-12-26 DIAGNOSIS — Z79899 Other long term (current) drug therapy: Secondary | ICD-10-CM | POA: Insufficient documentation

## 2017-12-26 DIAGNOSIS — Z91040 Latex allergy status: Secondary | ICD-10-CM | POA: Diagnosis not present

## 2017-12-26 DIAGNOSIS — I10 Essential (primary) hypertension: Secondary | ICD-10-CM | POA: Diagnosis not present

## 2017-12-26 DIAGNOSIS — Z87891 Personal history of nicotine dependence: Secondary | ICD-10-CM | POA: Insufficient documentation

## 2017-12-26 LAB — CBC
HEMATOCRIT: 39 % (ref 35.0–47.0)
Hemoglobin: 13.3 g/dL (ref 12.0–16.0)
MCH: 32.6 pg (ref 26.0–34.0)
MCHC: 34.1 g/dL (ref 32.0–36.0)
MCV: 95.6 fL (ref 80.0–100.0)
Platelets: 222 10*3/uL (ref 150–440)
RBC: 4.08 MIL/uL (ref 3.80–5.20)
RDW: 13.8 % (ref 11.5–14.5)
WBC: 7.1 10*3/uL (ref 3.6–11.0)

## 2017-12-26 LAB — BASIC METABOLIC PANEL
Anion gap: 7 (ref 5–15)
BUN: 28 mg/dL — AB (ref 8–23)
CHLORIDE: 106 mmol/L (ref 98–111)
CO2: 28 mmol/L (ref 22–32)
Calcium: 9.1 mg/dL (ref 8.9–10.3)
Creatinine, Ser: 1.19 mg/dL — ABNORMAL HIGH (ref 0.44–1.00)
GFR calc Af Amer: 50 mL/min — ABNORMAL LOW (ref >60)
GFR calc non Af Amer: 43 mL/min — ABNORMAL LOW (ref >60)
Glucose, Bld: 110 mg/dL — ABNORMAL HIGH (ref 70–99)
POTASSIUM: 3.9 mmol/L (ref 3.5–5.1)
SODIUM: 141 mmol/L (ref 135–145)

## 2017-12-26 LAB — TROPONIN I: Troponin I: <0.03 ng/mL (ref <0.03)

## 2017-12-26 NOTE — Progress Notes (Signed)
[name] Female    DOB: Oct 10, 1940   77 y.o.   MRN: [id number] Visit Date: 12/26/2017  Today's Provider: Lavon Paganini, MD   I, Martha Clan, CMA, am acting as scribe for Lavon Paganini, MD.  Chief Complaint  Patient presents with  . Shortness of Breath   Subjective:    Shortness of Breath  This is a recurrent problem. Episode onset: x months since spring time. The problem has been gradually worsening. Associated symptoms include headaches (mild H/A recently), leg pain (leg cramps at nighttime) and wheezing. Pertinent negatives include no abdominal pain, chest pain, claudication, ear pain, fever, hemoptysis, leg swelling, orthopnea, syncope or vomiting. The symptoms are aggravated by pollens and weather changes (climbing stairs exacerbated SOB, and caused a "wet sweat"). The patient has no known risk factors for DVT/PE. Treatments tried: "breathing strips", benadryl. The treatment provided moderate relief. Her past medical history is significant for allergies and bronchiolitis (see last CXR).   Patient insists that she must have allergies to Lilac bushes or asthma.  She denies any chest pain.  She does admit that her shortness of breath is exacerbated by exertion and that has been getting worse over the past several months.  She did become significantly short of breath and diaphoretic after climbing the stairs into our office this morning.  She does have history of carotid artery disease, HTN, and mitral valve regurg.  She is followed by Dr Clayborn Bigness.  She has never been found to have CAD.    Allergies  Allergen Reactions  . Ciprofloxacin   . Tegaderm Ag Mesh 2"X2"  [Wound Dressings]     when removing the patch her skin came off.  . Latex Rash  . Penicillins Rash     Current Outpatient Medications:  .  acetaminophen (TYLENOL) 650 MG CR tablet, Take 650 mg by mouth every 8 (eight) hours as needed for pain., Disp: , Rfl:  .  atorvastatin (LIPITOR) 20  MG tablet, TAKE 1 TABLET BY MOUTH AT  BEDTIME FOR CHOLESTEROL, Disp: 90 tablet, Rfl: 3 .  Biotin (BIOTIN 5000) 5 MG CAPS, Take 5 mg by mouth., Disp: , Rfl:  .  Calcium Carbonate-Vitamin D 600-200 MG-UNIT TABS, Take 2 tablets by mouth daily., Disp: , Rfl:  .  Chromium Picolinate 1000 MCG TABS, Take 1 tablet by mouth daily., Disp: , Rfl:  .  Coenzyme Q10 100 MG TABS, Take 1 capsule by mouth daily., Disp: , Rfl:  .  Cyanocobalamin (B-12) 2500 MCG TABS, Take 2,500 mcg by mouth daily., Disp: , Rfl:  .  Glucos-Chond-Hyal Ac-Ca Fructo (MOVE FREE JOINT HEALTH ADVANCE) TABS, Take by mouth daily., Disp: , Rfl:  .  hydrochlorothiazide (HYDRODIURIL) 12.5 MG tablet, Take 0.5 tablets (6.25 mg total) by mouth daily., Disp: 45 tablet, Rfl: 3 .  levothyroxine (SYNTHROID, LEVOTHROID) 112 MCG tablet, TAKE 1 TABLET BY MOUTH  DAILY BEFORE BREAKFAST, Disp: 90 tablet, Rfl: 3 .  lisinopril (PRINIVIL,ZESTRIL) 40 MG tablet, Take 1 tablet (40 mg total) by mouth daily., Disp: 90 tablet, Rfl: 3 .  Magnesium 250 MG TABS, Take 1 tablet (250 mg total) by mouth daily. (Patient taking differently: Take 400 mg by mouth daily. ), Disp: 30 tablet, Rfl: 0 .  MULTIPLE VITAMIN PO, Take 1 tablet by mouth daily. , Disp: , Rfl:  .  Multiple Vitamins-Minerals (ICAPS AREDS 2 PO), Take by mouth daily., Disp: , Rfl:  .  naproxen sodium (ALEVE) 220 MG tablet, Take 220 mg  by mouth daily as needed., Disp: , Rfl:  .  Omega-3 Fatty Acids (FISH OIL EXTRA STRENGTH) 1200 MG CAPS, Take 1 capsule by mouth daily., Disp: , Rfl:  .  simethicone (MYLICON) 297 MG chewable tablet, Chew 125 mg by mouth daily., Disp: , Rfl:  .  triamcinolone cream (KENALOG) 0.1 %, Apply 1 application topically 2 (two) times daily., Disp: 80 g, Rfl: 1 .  vitamin E 400 UNIT capsule, Take 400 Units by mouth daily., Disp: , Rfl:  .  Zinc 50 MG CAPS, Take 50 mg by mouth daily., Disp: , Rfl:   Review of Systems  Constitutional: Negative for fever.  HENT: Negative for ear pain.     Respiratory: Positive for shortness of breath and wheezing. Negative for hemoptysis.   Cardiovascular: Negative for chest pain, orthopnea, claudication, leg swelling and syncope.  Gastrointestinal: Negative for abdominal pain and vomiting.  Neurological: Positive for headaches (mild H/A recently).    Social History   Tobacco Use  . Smoking status: Former Smoker    Types: Cigarettes    Last attempt to quit: 05/13/1996    Years since quitting: 21.6  . Smokeless tobacco: Never Used  Substance Use Topics  . Alcohol use: Yes    Alcohol/week: 0.0 - 1.0 standard drinks    Comment: 1 glass of wine occasionally    Objective:   BP 136/60 (BP Location: Left Arm, Patient Position: Sitting, Cuff Size: Large)   Pulse (!) 32   Temp 98 F (36.7 C) (Oral)   Resp 16   Wt 271 lb (122.9 kg)   SpO2 97%   BMI 45.10 kg/m  Vitals:   12/26/17 0854  BP: 136/60  Pulse: (!) 32  Resp: 16  Temp: 98 F (36.7 C)  TempSrc: Oral  SpO2: 97%  Weight: 271 lb (122.9 kg)     Physical Exam  Constitutional: She is oriented to person, place, and time. She appears well-developed and well-nourished. She does not appear ill. No distress.  HENT:  Head: Normocephalic and atraumatic.  Mouth/Throat: Oropharynx is clear and moist.  Eyes: Pupils are equal, round, and reactive to light.  Neck: Neck supple. No JVD present. No thyromegaly present.  Cardiovascular: Normal pulses. An irregular rhythm present.  Extrasystoles are present. Bradycardia present.  Pulmonary/Chest: Effort normal and breath sounds normal. No tachypnea. No respiratory distress. She exhibits no tenderness.  Abdominal: Soft. She exhibits no distension. There is no tenderness.  Musculoskeletal:       Right lower leg: She exhibits no edema.       Left lower leg: She exhibits no edema.  Lymphadenopathy:    She has no cervical adenopathy.  Neurological: She is alert and oriented to person, place, and time.  Skin: Skin is warm and dry.  Capillary refill takes less than 2 seconds. No rash noted.  Psychiatric: She has a normal mood and affect. Her behavior is normal.    EKG: Bradycardia with frequent PVCs and ventricular escape complexes.  Stable right bundle branch block.  Ventricular escape complexes are new since last EKG and have flipped T waves.     Assessment & Plan:   1. Shortness of breath 2. Ventricular escape rhythm 3. Diaphoresis -Patient with worsening shortness of breath that is exacerbated with exertion -She also was diaphoretic with minimal exertion this morning -she has new EKG chages and has heart disease risk factors - encouraged patient to go to ER - she insists on driving herself and keeps asking if she really  needs to    Return if symptoms worsen or fail to improve.   The entirety of the information documented in the History of Present Illness, Review of Systems and Physical Exam were personally obtained by me. Portions of this information were initially documented by Raquel Sarna Ratchford, CMA and reviewed by me for thoroughness and accuracy.    Virginia Crews, MD, MPH [facility] 12/26/2017 11:08 AM

## 2017-12-26 NOTE — ED Provider Notes (Signed)
[facility] Emergency Department Provider Note   ____________________________________________   First MD Initiated Contact with Patient 12/26/17 1109     (approximate)  I have reviewed the triage vital signs and the nursing notes.   HISTORY  Chief Complaint Shortness of Breath    HPI Mary Kennedy is a 77 y.o. female sent from family practice office apparently for EKG changes.  EKG shows right bundle branch block but this was present in November.  Patient reports she is been getting short of breath when she walks and sweaty when she walks and seems to be happening after she walks less distance.  She was beginning to get short of breath when she walked from the entrance of the hospital over to x-ray to get a chest x-ray.  She thinks she has allergies.  She is not having any itchy eyes or runny nose at the present time she is not wheezing she says she coughs up some clear phlegm from time to time.  She does not have any chest pain.  Seem to get tight  or congested in her neck she reports.  Ultrasound of the neck she reports showed a 55% blockage in her carotids within the last year.  She had acute heart cath but that was in the 1990s in [place]   Past Medical History:  Diagnosis Date  . Heart murmur   . Hyperlipidemia   . Hypertension   . Hypothyroidism   . Thyroid disease     Patient Active Problem List   Diagnosis Date Noted  . Benign neoplasm of cecum   . Benign neoplasm of ascending colon   . Bradycardia 04/25/2015  . H/O cardiovascular disorder 12/01/2014  . LBP (low back pain) 12/01/2014  . Adenopathy 12/01/2014  . Atypical squamous cells of undetermined significance on cytologic smear of vagina (ASC-US) 12/01/2014  . Adenitis, salivary, recurring 12/01/2014  . Absence of bladder continence 12/01/2014  . Bursitis of knee 10/21/2013  . Arthritis of knee, degenerative 09/30/2013  . Carotid artery obstruction 04/20/2009  . Urinary system  disease 10/19/2008  . Obesity, Class III, BMI 40-49.9 (morbid obesity) ([place]) 10/19/2008  . Family history of cardiovascular disease 07/06/2007  . Mitral regurgitation 02/23/2007  . History of colonic polyps 02/23/2007  . Benign hypertension 02/23/2007  . Bladder compliance low 02/23/2007  . Acne erythematosa 02/23/2007  . Menopausal symptom 02/23/2007  . Hyperlipidemia 02/19/2007  . Adult hypothyroidism 02/19/2007    Past Surgical History:  Procedure Laterality Date  . ABDOMINAL HYSTERECTOMY  1961   BIL Oophorectomy  . APPENDECTOMY  at age 75  . BILATERAL CARPAL TUNNEL RELEASE  1999  . CARDIAC CATHETERIZATION    . COLONOSCOPY WITH PROPOFOL N/A 08/27/2017   Procedure: COLONOSCOPY WITH PROPOFOL;  Surgeon: Lucilla Lame, MD;  Location: [facility] ENDOSCOPY;  Service: Endoscopy;  Laterality: N/A;  . FOOT SURGERY Bilateral   . TONSILLECTOMY  at age 36    Prior to Admission medications   Medication Sig Start Date End Date Taking? Authorizing Provider  acetaminophen (TYLENOL) 650 MG CR tablet Take 650 mg by mouth every 8 (eight) hours as needed for pain.    [provider]  atorvastatin (LIPITOR) 20 MG tablet TAKE 1 TABLET BY MOUTH AT  BEDTIME FOR CHOLESTEROL 07/04/17   Mar Daring, PA-C  Biotin (BIOTIN 5000) 5 MG CAPS Take 5 mg by mouth.    [provider]  Calcium Carbonate-Vitamin D 600-200 MG-UNIT TABS Take 2 tablets by mouth daily. 10/19/08  [provider]  Chromium Picolinate 1000 MCG TABS Take 1 tablet by mouth daily.    [provider]  Coenzyme Q10 100 MG TABS Take 1 capsule by mouth daily. 10/19/08   [provider]  Cyanocobalamin (B-12) 2500 MCG TABS Take 2,500 mcg by mouth daily.    [provider]  Glucos-Chond-Hyal Ac-Ca Fructo (MOVE FREE JOINT HEALTH ADVANCE) TABS Take by mouth daily.    [provider]  hydrochlorothiazide (HYDRODIURIL) 12.5 MG tablet Take 0.5 tablets (6.25 mg total) by mouth daily. 07/04/17    Mar Daring, PA-C  levothyroxine (SYNTHROID, LEVOTHROID) 112 MCG tablet TAKE 1 TABLET BY MOUTH  DAILY BEFORE BREAKFAST 07/04/17   Mar Daring, PA-C  lisinopril (PRINIVIL,ZESTRIL) 40 MG tablet Take 1 tablet (40 mg total) by mouth daily. 07/04/17   Mar Daring, PA-C  Magnesium 250 MG TABS Take 1 tablet (250 mg total) by mouth daily. Patient taking differently: Take 400 mg by mouth daily.  01/24/16   Mar Daring, PA-C  MULTIPLE VITAMIN PO Take 1 tablet by mouth daily.  04/20/08   [provider]  Multiple Vitamins-Minerals (ICAPS AREDS 2 PO) Take by mouth daily.    [provider]  naproxen sodium (ALEVE) 220 MG tablet Take 220 mg by mouth daily as needed.    [provider]  Omega-3 Fatty Acids (FISH OIL EXTRA STRENGTH) 1200 MG CAPS Take 1 capsule by mouth daily.    [provider]  simethicone (MYLICON) 824 MG chewable tablet Chew 125 mg by mouth daily.    [provider]  triamcinolone cream (KENALOG) 0.1 % Apply 1 application topically 2 (two) times daily. 07/15/17   Mar Daring, PA-C  vitamin E 400 UNIT capsule Take 400 Units by mouth daily.    [provider]  Zinc 50 MG CAPS Take 50 mg by mouth daily.    [provider]    Allergies Ciprofloxacin; Tegaderm ag mesh 2"x2"  [wound dressings]; Latex; and Penicillins  Family History  Problem Relation Age of Onset  . Pancreatitis Mother   . Drug abuse Brother        PTSD  . Heart attack Father   . Lung cancer Brother   . Breast cancer Cousin        2nd cousin    Social History Social History   Tobacco Use  . Smoking status: Former Smoker    Types: Cigarettes    Last attempt to quit: 05/13/1996    Years since quitting: 21.6  . Smokeless tobacco: Never Used  Substance Use Topics  . Alcohol use: Yes    Alcohol/week: 0.0 - 1.0 standard drinks    Comment: 1 glass of wine occasionally   . Drug use: No    Review of  Systems  Constitutional: No fever/chills Eyes: No visual changes. ENT: No sore throat. Cardiovascular: Denies chest pain. Respiratory: shortness of breath. Gastrointestinal: No abdominal pain.  No nausea, no vomiting.  No diarrhea.  No constipation. Genitourinary: Negative for dysuria. Musculoskeletal: Negative for back pain. Skin: Negative for rash. Neurological: Negative for headaches, focal weakness   ____________________________________________   PHYSICAL EXAM:  VITAL SIGNS: ED Triage Vitals [12/26/17 0957]  Enc Vitals Group     BP (!) 152/80     Pulse Rate 65     Resp 17     Temp 98.2 F (36.8 C)     Temp Source Oral     SpO2 97 %  Weight 268 lb 15.4 oz (122 kg)     Height 5\' 5"  (1.651 m)     Head Circumference      Peak Flow      Pain Score 0     Pain Loc      Pain Edu?    Constitutional: Alert and oriented. Well appearing and in no acute distress. Eyes: Conjunctivae are normal.  Head: Atraumatic. Nose: No congestion/rhinnorhea. Mouth/Throat: Mucous membranes are moist.  Oropharynx non-erythematous. Neck: No stridor Cardiovascular: Normal rate, regular rhythm. Grossly normal heart sounds.  Good peripheral circulation. Respiratory: Normal respiratory effort.  No retractions. Lungs CTAB. Gastrointestinal: Soft and nontender. No distention. No abdominal bruits. No CVA tenderness. Musculoskeletal: No lower extremity tenderness nor edema.   Neurologic:  Normal speech and language. No gross focal neurologic deficits are appreciated. No gait instability. Skin:  Skin is warm, dry and intact. No rash noted. Psychiatric: Mood and affect are normal. Speech and behavior are normal.  ____________________________________________   LABS (all labs ordered are listed, but only abnormal results are displayed)  Labs Reviewed  BASIC METABOLIC PANEL - Abnormal; Notable for the following components:      Result Value   Glucose, Bld 110 (*)    BUN 28 (*)    Creatinine,  Ser 1.19 (*)    GFR calc non Af Amer 43 (*)    GFR calc Af Amer 50 (*)    All other components within normal limits  CBC  TROPONIN I   ____________________________________________  EKG  EKG read interpreted by me shows normal sinus rhythm rate of 61 normal axis right bundle branch block looks very similar to November 2018 ____________________________________________  RADIOLOGY  ED MD interpretation: X-ray read by radiology reviewed by me shows no acute disease  Official radiology report(s): Dg Chest 2 View  Result Date: 12/26/2017 CLINICAL DATA:  Shortness of breath. EXAM: CHEST - 2 VIEW COMPARISON:  Radiographs of April 10, 2017. FINDINGS: The heart size and mediastinal contours are within normal limits. Atherosclerosis of thoracic aorta is noted. No pneumothorax or pleural effusion is noted. Both lungs are clear. The visualized skeletal structures are unremarkable. IMPRESSION: No active cardiopulmonary disease. Electronically Signed   By: Marijo Conception, M.D.   On: 12/26/2017 10:18    ____________________________________________   PROCEDURES  Procedure(s) performed:   Procedures  Critical Care performed:  ____________________________________________   INITIAL IMPRESSION / [place] / ED COURSE   Patient feels she is having allergy symptoms.  However she is old enough and has enough risk factors that angina is definitely a possibility.  I have spoken with Dr. Clayborn Bigness her cardiologist.  He will see her in the office today.        ____________________________________________   FINAL CLINICAL IMPRESSION(S) / ED DIAGNOSES  Final diagnoses:  SOB (shortness of breath)     ED Discharge Orders    None       Note:  This document was prepared using Dragon voice recognition software and may include unintentional dictation errors.    Nena Polio, MD 12/26/17 1139

## 2017-12-26 NOTE — ED Triage Notes (Signed)
Pt c/o increased SOB since the spring, but recently it was worse, breaking out into a sweat. Pt denies any chest pain at present. Was sent from [facility] for ECG changes.

## 2017-12-26 NOTE — Discharge Instructions (Signed)
Dr. Clayborn Bigness want to see you in his office today at 2.  Please make sure he keep the appointment.  Please return here at once if you get any worse.

## 2017-12-26 NOTE — ED Notes (Signed)
MD at bedside. 

## 2018-04-23 ENCOUNTER — Other Ambulatory Visit: Payer: Self-pay | Admitting: Physician Assistant

## 2018-04-23 DIAGNOSIS — E7800 Pure hypercholesterolemia, unspecified: Secondary | ICD-10-CM

## 2018-06-02 ENCOUNTER — Encounter: Payer: Self-pay | Admitting: Physician Assistant

## 2018-06-02 ENCOUNTER — Ambulatory Visit (INDEPENDENT_AMBULATORY_CARE_PROVIDER_SITE_OTHER): Payer: Medicare Other | Admitting: Physician Assistant

## 2018-06-02 VITALS — BP 129/69 | HR 69 | Temp 99.9°F | Resp 16 | Wt 266.0 lb

## 2018-06-02 DIAGNOSIS — J40 Bronchitis, not specified as acute or chronic: Secondary | ICD-10-CM | POA: Diagnosis not present

## 2018-06-02 DIAGNOSIS — J0140 Acute pansinusitis, unspecified: Secondary | ICD-10-CM

## 2018-06-02 DIAGNOSIS — Z9109 Other allergy status, other than to drugs and biological substances: Secondary | ICD-10-CM

## 2018-06-02 MED ORDER — DOXYCYCLINE HYCLATE 100 MG PO TABS: 100.0000 mg | ORAL_TABLET | Freq: Two times a day (BID) | ORAL | 0 refills | Status: DC

## 2018-06-02 MED ORDER — PREDNISONE 10 MG PO TABS: ORAL_TABLET | ORAL | 0 refills | Status: DC

## 2018-06-02 MED ORDER — PROMETHAZINE-DM 6.25-15 MG/5ML PO SYRP: 5.0000 mL | ORAL_SOLUTION | Freq: Four times a day (QID) | ORAL | 0 refills | Status: DC | PRN

## 2018-06-02 NOTE — Progress Notes (Signed)
[name] Female    DOB: 05-17-1940   78 y.o.   MRN: [id number] Visit Date: 06/02/2018  Today's Provider: Mar Daring, PA-C   Chief Complaint  Patient presents with  . URI   Subjective:     Cough  Associated symptoms include ear pain, postnasal drip, rhinorrhea, shortness of breath and wheezing. Pertinent negatives include no chest pain, chills, fever or sore throat.  Upper Respiratory Infection: Patient complains of symptoms of a URI. Symptoms include congestion and cough. Onset of symptoms was 5 days ago, gradually worsening since that time. She also c/o productive cough with  clear colored sputum for the past 5 days .  She is drinking plenty of fluids. Evaluation to date: none. Treatment to date: cough suppressants and decongestants.     Allergies  Allergen Reactions  . Ciprofloxacin   . Tegaderm Ag Mesh 2"X2"  [Wound Dressings]     when removing the patch her skin came off.  . Latex Rash  . Penicillins Rash     Current Outpatient Medications:  .  acetaminophen (TYLENOL) 650 MG CR tablet, Take 650 mg by mouth every 8 (eight) hours as needed for pain., Disp: , Rfl:  .  atorvastatin (LIPITOR) 20 MG tablet, TAKE 1 TABLET BY MOUTH AT  BEDTIME FOR CHOLESTEROL, Disp: 90 tablet, Rfl: 3 .  Biotin (BIOTIN 5000) 5 MG CAPS, Take 5 mg by mouth., Disp: , Rfl:  .  Calcium Carbonate-Vitamin D 600-200 MG-UNIT TABS, Take 2 tablets by mouth daily., Disp: , Rfl:  .  Chromium Picolinate 1000 MCG TABS, Take 1 tablet by mouth daily., Disp: , Rfl:  .  Coenzyme Q10 100 MG TABS, Take 1 capsule by mouth daily., Disp: , Rfl:  .  Cyanocobalamin (B-12) 2500 MCG TABS, Take 2,500 mcg by mouth daily., Disp: , Rfl:  .  Glucos-Chond-Hyal Ac-Ca Fructo (MOVE FREE JOINT HEALTH ADVANCE) TABS, Take by mouth daily., Disp: , Rfl:  .  hydrochlorothiazide (HYDRODIURIL) 12.5 MG tablet, Take 0.5 tablets (6.25 mg total) by mouth daily., Disp: 45 tablet, Rfl: 3 .  levothyroxine  (SYNTHROID, LEVOTHROID) 112 MCG tablet, TAKE 1 TABLET BY MOUTH  DAILY BEFORE BREAKFAST, Disp: 90 tablet, Rfl: 3 .  lisinopril (PRINIVIL,ZESTRIL) 40 MG tablet, Take 1 tablet (40 mg total) by mouth daily., Disp: 90 tablet, Rfl: 3 .  Magnesium 250 MG TABS, Take 1 tablet (250 mg total) by mouth daily. (Patient taking differently: Take 400 mg by mouth daily. ), Disp: 30 tablet, Rfl: 0 .  MULTIPLE VITAMIN PO, Take 1 tablet by mouth daily. , Disp: , Rfl:  .  Multiple Vitamins-Minerals (ICAPS AREDS 2 PO), Take by mouth daily., Disp: , Rfl:  .  naproxen sodium (ALEVE) 220 MG tablet, Take 220 mg by mouth daily as needed., Disp: , Rfl:  .  Omega-3 Fatty Acids (FISH OIL EXTRA STRENGTH) 1200 MG CAPS, Take 1 capsule by mouth daily., Disp: , Rfl:  .  simethicone (MYLICON) 638 MG chewable tablet, Chew 125 mg by mouth daily., Disp: , Rfl:  .  triamcinolone cream (KENALOG) 0.1 %, Apply 1 application topically 2 (two) times daily., Disp: 80 g, Rfl: 1 .  vitamin E 400 UNIT capsule, Take 400 Units by mouth daily., Disp: , Rfl:  .  Zinc 50 MG CAPS, Take 50 mg by mouth daily., Disp: , Rfl:   Review of Systems  Constitutional: Negative for appetite change, chills, fatigue and fever.  HENT: Positive for congestion, ear  pain, postnasal drip, rhinorrhea, sinus pressure and sinus pain. Negative for sore throat.   Respiratory: Positive for cough, chest tightness, shortness of breath and wheezing.   Cardiovascular: Negative for chest pain and palpitations.  Gastrointestinal: Negative for abdominal pain, nausea and vomiting.  Neurological: Negative for dizziness and weakness.    Social History   Tobacco Use  . Smoking status: Former Smoker    Types: Cigarettes    Last attempt to quit: 05/13/1996    Years since quitting: 22.0  . Smokeless tobacco: Never Used  Substance Use Topics  . Alcohol use: Yes    Alcohol/week: 0.0 - 1.0 standard drinks    Comment: 1 glass of wine occasionally       Objective:   BP 129/69  (BP Location: Left Arm, Patient Position: Sitting, Cuff Size: Normal)   Pulse 69   Temp 99.9 F (37.7 C) (Oral)   Resp 16   Wt 266 lb (120.7 kg)   SpO2 95%   BMI 44.26 kg/m  Vitals:   06/02/18 1355  BP: 129/69  Pulse: 69  Resp: 16  Temp: 99.9 F (37.7 C)  TempSrc: Oral  SpO2: 95%  Weight: 266 lb (120.7 kg)     Physical Exam Vitals signs reviewed.  Constitutional:      General: She is not in acute distress.    Appearance: She is well-developed. She is obese. She is ill-appearing. She is not diaphoretic.  HENT:     Head: Normocephalic and atraumatic.     Right Ear: Hearing, tympanic membrane, ear canal and external ear normal.     Left Ear: Hearing, tympanic membrane, ear canal and external ear normal.     Nose: Nose normal.     Mouth/Throat:     Pharynx: Uvula midline. No oropharyngeal exudate.  Eyes:     General: No scleral icterus.       Right eye: No discharge.        Left eye: No discharge.     Conjunctiva/sclera: Conjunctivae normal.     Pupils: Pupils are equal, round, and reactive to light.  Neck:     Musculoskeletal: Normal range of motion and neck supple.     Thyroid: No thyromegaly.     Trachea: No tracheal deviation.  Cardiovascular:     Rate and Rhythm: Normal rate and regular rhythm.     Heart sounds: Normal heart sounds. No murmur. No friction rub. No gallop.   Pulmonary:     Effort: Pulmonary effort is normal. No respiratory distress.     Breath sounds: No stridor. Examination of the right-upper field reveals rhonchi. Examination of the left-upper field reveals rhonchi. Examination of the right-middle field reveals rhonchi. Examination of the left-middle field reveals rhonchi. Decreased breath sounds (throughout) and rhonchi present. No wheezing or rales.  Lymphadenopathy:     Cervical: No cervical adenopathy.  Skin:    General: Skin is warm and dry.        Assessment & Plan    1. Acute non-recurrent pansinusitis Worsening symptoms that have  not responded to OTC medications. Will give Doxycycline as below. Continue allergy medications. Stay well hydrated and get plenty of rest. Call if no symptom improvement or if symptoms worsen. - doxycycline (VIBRA-TABS) 100 MG tablet; Take 1 tablet (100 mg total) by mouth 2 (two) times daily.  Dispense: 20 tablet; Refill: 0  2. Bronchitis Worsening. Will treat with doxycyline, prednisone and Promethazine DM for cough. Push fluids. Rest. Call if worsening.  - doxycycline (  VIBRA-TABS) 100 MG tablet; Take 1 tablet (100 mg total) by mouth 2 (two) times daily.  Dispense: 20 tablet; Refill: 0 - predniSONE (DELTASONE) 10 MG tablet; Take 6 tabs PO on day 1&2, 5 tabs PO on day 3&4, 4 tabs PO on day 5&6, 3 tabs PO on day 7&8, 2 tabs PO on day 9&10, 1 tab PO on day 11&12.  Dispense: 42 tablet; Refill: 0 - promethazine-dextromethorphan (PROMETHAZINE-DM) 6.25-15 MG/5ML syrup; Take 5 mLs by mouth 4 (four) times daily as needed for cough.  Dispense: 180 mL; Refill: 0  3. Environmental allergies Patient desires allergy testing. Referral placed as below. May use [facility] but was not listed under referral choices.  - Ambulatory referral to [place], PA-C  [facility]

## 2018-06-02 NOTE — Patient Instructions (Signed)
Acute Bronchitis, Adult Acute bronchitis is when air tubes (bronchi) in the lungs suddenly get swollen. The condition can make it hard to breathe. It can also cause these symptoms:  A cough.  Coughing up clear, yellow, or green mucus.  Wheezing.  Chest congestion.  Shortness of breath.  A fever.  Body aches.  Chills.  A sore throat. Follow these instructions at home:  Medicines  Take over-the-counter and prescription medicines only as told by your doctor.  If you were prescribed an antibiotic medicine, take it as told by your doctor. Do not stop taking the antibiotic even if you start to feel better. General instructions  Rest.  Drink enough fluids to keep your pee (urine) pale yellow.  Avoid smoking and secondhand smoke. If you smoke and you need help quitting, ask your doctor. Quitting will help your lungs heal faster.  Use an inhaler, cool mist vaporizer, or humidifier as told by your doctor.  Keep all follow-up visits as told by your doctor. This is important. How is this prevented? To lower your risk of getting this condition again:  Wash your hands often with soap and water. If you cannot use soap and water, use hand sanitizer.  Avoid contact with people who have cold symptoms.  Try not to touch your hands to your mouth, nose, or eyes.  Make sure to get the flu shot every year. Contact a doctor if:  Your symptoms do not get better in 2 weeks. Get help right away if:  You cough up blood.  You have chest pain.  You have very bad shortness of breath.  You become dehydrated.  You faint (pass out) or keep feeling like you are going to pass out.  You keep throwing up (vomiting).  You have a very bad headache.  Your fever or chills gets worse. This information is not intended to replace advice given to you by your health care provider. Make sure you discuss any questions you have with your health care provider. Document Released: 10/17/2007 Document  Revised: 12/12/2016 Document Reviewed: 10/19/2015 Elsevier Interactive Patient Education  2019 Elsevier Inc.  

## 2018-06-09 ENCOUNTER — Telehealth: Payer: Self-pay

## 2018-06-09 NOTE — Telephone Encounter (Signed)
Patient advised as below.  

## 2018-06-09 NOTE — Telephone Encounter (Signed)
Work note sent through [employer]

## 2018-06-09 NOTE — Telephone Encounter (Signed)
Patient reports that her symptoms are still present and is requesting a note to be off work for at least a week. Patient reports she is still coughing and coughing up mucus.  CB# [phone]

## 2018-06-13 ENCOUNTER — Ambulatory Visit: Payer: Self-pay | Admitting: Physician Assistant

## 2018-06-20 ENCOUNTER — Ambulatory Visit (INDEPENDENT_AMBULATORY_CARE_PROVIDER_SITE_OTHER): Payer: Medicare Other | Admitting: Physician Assistant

## 2018-06-20 ENCOUNTER — Encounter: Payer: Self-pay | Admitting: Physician Assistant

## 2018-06-20 VITALS — BP 125/70 | HR 70 | Temp 98.1°F | Resp 16

## 2018-06-20 DIAGNOSIS — J40 Bronchitis, not specified as acute or chronic: Secondary | ICD-10-CM | POA: Diagnosis not present

## 2018-06-20 DIAGNOSIS — J0140 Acute pansinusitis, unspecified: Secondary | ICD-10-CM | POA: Diagnosis not present

## 2018-06-20 DIAGNOSIS — E039 Hypothyroidism, unspecified: Secondary | ICD-10-CM

## 2018-06-20 DIAGNOSIS — I10 Essential (primary) hypertension: Secondary | ICD-10-CM

## 2018-06-20 DIAGNOSIS — E7800 Pure hypercholesterolemia, unspecified: Secondary | ICD-10-CM

## 2018-06-20 MED ORDER — DOXYCYCLINE HYCLATE 100 MG PO TABS: 100.0000 mg | ORAL_TABLET | Freq: Two times a day (BID) | ORAL | 0 refills | Status: DC

## 2018-06-20 MED ORDER — ALBUTEROL SULFATE HFA 108 (90 BASE) MCG/ACT IN AERS: 2.0000 | INHALATION_SPRAY | Freq: Four times a day (QID) | RESPIRATORY_TRACT | 0 refills | Status: DC | PRN

## 2018-06-20 MED ORDER — FLUTICASONE PROPIONATE 50 MCG/ACT NA SUSP: 2.0000 | Freq: Every day | NASAL | 6 refills | Status: DC

## 2018-06-20 NOTE — Patient Instructions (Signed)
Fluticasone nasal spray What is this medicine? FLUTICASONE (floo TIK a sone) is a corticosteroid. This medicine is used to treat the symptoms of allergies like sneezing, itchy red eyes, and itchy, runny, or stuffy nose. This medicine is also used to treat nasal polyps. This medicine may be used for other purposes; ask your health care provider or pharmacist if you have questions. COMMON BRAND NAME(S): ClariSpray, Flonase, Flonase Allergy Relief, Flonase Sensimist, Veramyst, XHANCE What should I tell my health care provider before I take this medicine? They need to know if you have any of these conditions: -eye disease, vision problems -infection, like tuberculosis, herpes, or fungal infection -recent surgery on nose or sinuses -taking a corticosteroid by mouth -an unusual or allergic reaction to fluticasone, steroids, other medicines, foods, dyes, or preservatives -pregnant or trying to get pregnant -breast-feeding How should I use this medicine? This medicine is for use in the nose. Follow the directions on your product or prescription label. This medicine works best if used at regular intervals. Do not use more often than directed. Make sure that you are using your nasal spray correctly. After 6 months of daily use for allergies, talk to your doctor or health care professional before using it for a longer time. Ask your doctor or health care professional if you have any questions. Talk to your pediatrician regarding the use of this medicine in children. Special care may be needed. Some products have been used for allergies in children as young as 2 years. After 2 months of daily use without a prescription in a child, talk to your pediatrician before using it for a longer time. Use of this medicine for nasal polyps is not approved in children. Overdosage: If you think you have taken too much of this medicine contact a poison control center or emergency room at once. NOTE: This medicine is only for  you. Do not share this medicine with others. What if I miss a dose? If you miss a dose, use it as soon as you remember. If it is almost time for your next dose, use only that dose and continue with your regular schedule. Do not use double or extra doses. What may interact with this medicine? -certain antibiotics like clarithromycin and telithromycin -certain medicines for fungal infections like ketoconazole, itraconazole, and voriconazole -conivaptan -nefazodone -some medicines for HIV -vaccines This list may not describe all possible interactions. Give your health care provider a list of all the medicines, herbs, non-prescription drugs, or dietary supplements you use. Also tell them if you smoke, drink alcohol, or use illegal drugs. Some items may interact with your medicine. What should I watch for while using this medicine? Visit your healthcare professional for regular checks on your progress. Tell your healthcare professional if your symptoms do not start to get better or if they get worse. This medicine may increase your risk of getting an infection. Tell your doctor or health care professional if you are around anyone with measles or chickenpox, or if you develop sores or blisters that do not heal properly. What side effects may I notice from receiving this medicine? Side effects that you should report to your doctor or health care professional as soon as possible: -allergic reactions like skin rash, itching or hives, swelling of the face, lips, or tongue -changes in vision -crusting or sores in the nose -nosebleed -signs and symptoms of infection like fever or chills; cough; sore throat -white patches or sores in the mouth or nose Side effects that  usually do not require medical attention (report to your doctor or health care professional if they continue or are bothersome): -burning or irritation inside the nose or throat -changes in taste or smell -cough -headache This list may  not describe all possible side effects. Call your doctor for medical advice about side effects. You may report side effects to FDA at 1-800-FDA-1088. Where should I keep my medicine? Keep out of the reach of children. Store at room temperature between 15 and 30 degrees C (59 and 86 degrees F). Avoid exposure to extreme heat, cold, or light. Throw away any unused medicine after the expiration date. NOTE: This sheet is a summary. It may not cover all possible information. If you have questions about this medicine, talk to your doctor, pharmacist, or health care provider.  2019 Elsevier/Gold Standard (2017-05-23 14:10:08) Albuterol inhalation aerosol What is this medicine? ALBUTEROL (al Norm[place]ean [place]) is a bronchodilator. It helps open up the airways in your lungs to make it easier to breathe. This medicine is used to treat and to prevent bronchospasm. This medicine may be used for other purposes; ask your health care provider or pharmacist if you have questions. COMMON BRAND NAME(S): Proair HFA, Proventil, Proventil HFA, Respirol, Ventolin, Ventolin HFA What should I tell my health care provider before I take this medicine? They need to know if you have any of the following conditions: -diabetes -heart disease or irregular heartbeat -high blood pressure -pheochromocytoma -seizures -thyroid disease -an unusual or allergic reaction to albuterol, levalbuterol, sulfites, other medicines, foods, dyes, or preservatives -pregnant or trying to get pregnant -breast-feeding How should I use this medicine? This medicine is for inhalation through the mouth. Follow the directions on your prescription label. Take your medicine at regular intervals. Do not use more often than directed. Make sure that you are using your inhaler correctly. Ask you doctor or health care provider if you have any questions. Talk to your pediatrician regarding the use of this medicine in children. Special care may be  needed. Overdosage: If you think you have taken too much of this medicine contact a poison control center or emergency room at once. NOTE: This medicine is only for you. Do not share this medicine with others. What if I miss a dose? If you miss a dose, use it as soon as you can. If it is almost time for your next dose, use only that dose. Do not use double or extra doses. What may interact with this medicine? -anti-infectives like chloroquine and pentamidine -caffeine -cisapride -diuretics -medicines for colds -medicines for depression or for emotional or psychotic conditions -medicines for weight loss including some herbal products -methadone -some antibiotics like clarithromycin, erythromycin, levofloxacin, and linezolid -some heart medicines -steroid hormones like dexamethasone, cortisone, hydrocortisone -theophylline -thyroid hormones This list may not describe all possible interactions. Give your health care provider a list of all the medicines, herbs, non-prescription drugs, or dietary supplements you use. Also tell them if you smoke, drink alcohol, or use illegal drugs. Some items may interact with your medicine. What should I watch for while using this medicine? Tell your doctor or health care professional if your symptoms do not improve. Do not use extra albuterol. If your asthma or bronchitis gets worse while you are using this medicine, call your doctor right away. If your mouth gets dry try chewing sugarless gum or sucking hard candy. Drink water as directed. What side effects may I notice from receiving this medicine? Side effects that you should report  to your doctor or health care professional as soon as possible: -allergic reactions like skin rash, itching or hives, swelling of the face, lips, or tongue -breathing problems -chest pain -feeling faint or lightheaded, falls -high blood pressure -irregular heartbeat -fever -muscle cramps or weakness -pain, tingling,  numbness in the hands or feet -vomiting Side effects that usually do not require medical attention (report to your doctor or health care professional if they continue or are bothersome): -cough -difficulty sleeping -headache -nervousness or trembling -stomach upset -stuffy or runny nose -throat irritation -unusual taste This list may not describe all possible side effects. Call your doctor for medical advice about side effects. You may report side effects to FDA at 1-800-FDA-1088. Where should I keep my medicine? Keep out of the reach of children. Store at room temperature between 15 and 30 degrees C (59 and 86 degrees F). The contents are under pressure and may burst when exposed to heat or flame. Do not freeze. This medicine does not work as well if it is too cold. Throw away any unused medicine after the expiration date. Inhalers need to be thrown away after the labeled number of puffs have been used or by the expiration date; whichever comes first. Ventolin HFA should be thrown away 12 months after removing from foil pouch. Check the instructions that come with your medicine. NOTE: This sheet is a summary. It may not cover all possible information. If you have questions about this medicine, talk to your doctor, pharmacist, or health care provider.  2019 Elsevier/Gold Standard (2012-10-16 10:57:17)

## 2018-06-20 NOTE — Progress Notes (Signed)
[name] Female    DOB: 1941-01-29   78 y.o.   MRN: [id number] Visit Date: 06/20/2018  Today's Provider: Mar Daring, PA-C   Chief Complaint  Patient presents with  . Follow-up   Subjective:     HPI  Bronchitis: Patient here for follow up. Patient was treated with doxycyline, prednisone and Promethazine DM for cough.  Patient completed doxycycline and prednisone. Patient states she feel somewhat better but symptoms have not clear up all the way.  Patient also complains of sweating. Occurs at anytime. States last time she had these symptoms her thyroid was off. Patient due for labs.    Allergies  Allergen Reactions  . Ciprofloxacin   . Tegaderm Ag Mesh 2"X2"  [Wound Dressings]     when removing the patch her skin came off.  . Latex Rash  . Penicillins Rash     Current Outpatient Medications:  .  acetaminophen (TYLENOL) 650 MG CR tablet, Take 650 mg by mouth every 8 (eight) hours as needed for pain., Disp: , Rfl:  .  atorvastatin (LIPITOR) 20 MG tablet, TAKE 1 TABLET BY MOUTH AT  BEDTIME FOR CHOLESTEROL, Disp: 90 tablet, Rfl: 3 .  Biotin (BIOTIN 5000) 5 MG CAPS, Take 5 mg by mouth., Disp: , Rfl:  .  Calcium Carbonate-Vitamin D 600-200 MG-UNIT TABS, Take 2 tablets by mouth daily., Disp: , Rfl:  .  Chromium Picolinate 1000 MCG TABS, Take 1 tablet by mouth daily., Disp: , Rfl:  .  Coenzyme Q10 100 MG TABS, Take 1 capsule by mouth daily., Disp: , Rfl:  .  Cyanocobalamin (B-12) 2500 MCG TABS, Take 2,500 mcg by mouth daily., Disp: , Rfl:  .  Glucos-Chond-Hyal Ac-Ca Fructo (MOVE FREE JOINT HEALTH ADVANCE) TABS, Take by mouth daily., Disp: , Rfl:  .  hydrochlorothiazide (HYDRODIURIL) 12.5 MG tablet, Take 0.5 tablets (6.25 mg total) by mouth daily., Disp: 45 tablet, Rfl: 3 .  levothyroxine (SYNTHROID, LEVOTHROID) 112 MCG tablet, TAKE 1 TABLET BY MOUTH  DAILY BEFORE BREAKFAST, Disp: 90 tablet, Rfl: 3 .  lisinopril (PRINIVIL,ZESTRIL) 40 MG tablet, Take 1  tablet (40 mg total) by mouth daily., Disp: 90 tablet, Rfl: 3 .  Magnesium 250 MG TABS, Take 1 tablet (250 mg total) by mouth daily. (Patient taking differently: Take 400 mg by mouth daily. ), Disp: 30 tablet, Rfl: 0 .  MULTIPLE VITAMIN PO, Take 1 tablet by mouth daily. , Disp: , Rfl:  .  Multiple Vitamins-Minerals (ICAPS AREDS 2 PO), Take by mouth daily., Disp: , Rfl:  .  naproxen sodium (ALEVE) 220 MG tablet, Take 220 mg by mouth daily as needed., Disp: , Rfl:  .  Omega-3 Fatty Acids (FISH OIL EXTRA STRENGTH) 1200 MG CAPS, Take 1 capsule by mouth daily., Disp: , Rfl:  .  promethazine-dextromethorphan (PROMETHAZINE-DM) 6.25-15 MG/5ML syrup, Take 5 mLs by mouth 4 (four) times daily as needed for cough., Disp: 180 mL, Rfl: 0 .  simethicone (MYLICON) 409 MG chewable tablet, Chew 125 mg by mouth daily., Disp: , Rfl:  .  triamcinolone cream (KENALOG) 0.1 %, Apply 1 application topically 2 (two) times daily., Disp: 80 g, Rfl: 1 .  vitamin E 400 UNIT capsule, Take 400 Units by mouth daily., Disp: , Rfl:  .  Zinc 50 MG CAPS, Take 50 mg by mouth daily., Disp: , Rfl:  .  doxycycline (VIBRA-TABS) 100 MG tablet, Take 1 tablet (100 mg total) by mouth 2 (two) times daily. (Patient not  taking: Reported on 06/20/2018), Disp: 20 tablet, Rfl: 0 .  predniSONE (DELTASONE) 10 MG tablet, Take 6 tabs PO on day 1&2, 5 tabs PO on day 3&4, 4 tabs PO on day 5&6, 3 tabs PO on day 7&8, 2 tabs PO on day 9&10, 1 tab PO on day 11&12. (Patient not taking: Reported on 06/20/2018), Disp: 42 tablet, Rfl: 0  Review of Systems  Constitutional: Negative.   HENT: Positive for congestion, ear pain, postnasal drip and sinus pressure.   Respiratory: Positive for cough and wheezing.   Cardiovascular: Negative.   Gastrointestinal: Negative.   Neurological: Negative.     Social History   Tobacco Use  . Smoking status: Former Smoker    Types: Cigarettes    Last attempt to quit: 05/13/1996    Years since quitting: 22.1  . Smokeless  tobacco: Never Used  Substance Use Topics  . Alcohol use: Yes    Alcohol/week: 0.0 - 1.0 standard drinks    Comment: 1 glass of wine occasionally       Objective:   BP 125/70 (BP Location: Left Arm, Patient Position: Sitting, Cuff Size: Large)   Pulse 70   Temp 98.1 F (36.7 C) (Oral)   Resp 16   SpO2 95%  Vitals:   06/20/18 0939  BP: 125/70  Pulse: 70  Resp: 16  Temp: 98.1 F (36.7 C)  TempSrc: Oral  SpO2: 95%     Physical Exam Vitals signs reviewed.  Constitutional:      General: She is not in acute distress.    Appearance: She is well-developed. She is obese. She is not diaphoretic.  HENT:     Head: Normocephalic and atraumatic.     Right Ear: Hearing, tympanic membrane, ear canal and external ear normal.     Left Ear: Hearing, tympanic membrane, ear canal and external ear normal.     Nose: Congestion and rhinorrhea present.     Right Sinus: Maxillary sinus tenderness and frontal sinus tenderness present.     Left Sinus: Maxillary sinus tenderness and frontal sinus tenderness present.     Mouth/Throat:     Lips: Pink.     Mouth: Mucous membranes are moist.     Pharynx: Uvula midline. Posterior oropharyngeal erythema (cobblestoning) present. No oropharyngeal exudate.  Eyes:     General: No scleral icterus.       Right eye: No discharge.        Left eye: No discharge.     Conjunctiva/sclera: Conjunctivae normal.     Pupils: Pupils are equal, round, and reactive to light.  Neck:     Musculoskeletal: Normal range of motion and neck supple.     Thyroid: No thyromegaly.     Trachea: No tracheal deviation.  Cardiovascular:     Rate and Rhythm: Normal rate and regular rhythm.     Heart sounds: Murmur present. No friction rub. No gallop.   Pulmonary:     Effort: Pulmonary effort is normal. No respiratory distress.     Breath sounds: No stridor. Examination of the right-upper field reveals rhonchi. Rhonchi present. No wheezing or rales.  Lymphadenopathy:      Cervical: No cervical adenopathy.  Skin:    General: Skin is warm and dry.  Neurological:     Mental Status: She is alert.        Assessment & Plan    1. Acute non-recurrent pansinusitis Worsening symptoms that did not completely resolve.  Will continue doxycycline as below. Continue allergy medications.  Stay well hydrated and get plenty of rest. Call if no symptom improvement or if symptoms worsen. - doxycycline (VIBRA-TABS) 100 MG tablet; Take 1 tablet (100 mg total) by mouth 2 (two) times daily.  Dispense: 14 tablet; Refill: 0 - fluticasone (FLONASE) 50 MCG/ACT nasal spray; Place 2 sprays into both nostrils daily.  Dispense: 16 g; Refill: 6 - CBC w/Diff/Platelet  2. Bronchitis Add albuterol inhaler for prn use.  - doxycycline (VIBRA-TABS) 100 MG tablet; Take 1 tablet (100 mg total) by mouth 2 (two) times daily.  Dispense: 14 tablet; Refill: 0 - albuterol (PROVENTIL HFA;VENTOLIN HFA) 108 (90 Base) MCG/ACT inhaler; Inhale 2 puffs into the lungs every 6 (six) hours as needed for wheezing or shortness of breath.  Dispense: 1 Inhaler; Refill: 0  3. Benign hypertension Stable. Will check labs as below and f/u pending results. - CBC w/Diff/Platelet - Comprehensive Metabolic Panel (CMET) - Lipid Profile - HgB A1c  4. Adult hypothyroidism Stable. Contineu levothyroxine 146mcg. Will check labs as below and f/u pending results. - TSH  5. Pure hypercholesterolemia Stable. Continue atorvastatin 20mg . Will check labs as below and f/u pending results. - CBC w/Diff/Platelet - Comprehensive Metabolic Panel (CMET) - Lipid Profile - HgB A1c  6. Obesity, Class III, BMI 40-49.9 (morbid obesity) ([place]) Counseled patient on healthy lifestyle modifications including dieting and exercise.  - CBC w/Diff/Platelet - Comprehensive Metabolic Panel (CMET) - Lipid Profile - HgB A1c     Mar Daring, PA-C  [facility]

## 2018-06-23 ENCOUNTER — Telehealth: Payer: Self-pay | Admitting: Physician Assistant

## 2018-06-23 NOTE — Telephone Encounter (Signed)
Wonderful. 

## 2018-06-23 NOTE — Telephone Encounter (Signed)
Patient wanted to let you know that the fluticasone (FLONASE) 50 MCG/ACT nasal spray is helping her a lot and made a big difference.

## 2018-06-24 LAB — COMPREHENSIVE METABOLIC PANEL
ALT: 27 IU/L (ref 0–32)
AST: 22 IU/L (ref 0–40)
Albumin/Globulin Ratio: 1.5 (ref 1.2–2.2)
Albumin: 3.8 g/dL (ref 3.7–4.7)
Alkaline Phosphatase: 55 IU/L (ref 39–117)
BUN / CREAT RATIO: 18 (ref 12–28)
BUN: 20 mg/dL (ref 8–27)
Bilirubin Total: 0.5 mg/dL (ref 0.0–1.2)
CALCIUM: 9.5 mg/dL (ref 8.7–10.3)
CO2: 23 mmol/L (ref 20–29)
Chloride: 107 mmol/L — ABNORMAL HIGH (ref 96–106)
Creatinine, Ser: 1.1 mg/dL — ABNORMAL HIGH (ref 0.57–1.00)
GFR calc Af Amer: 56 mL/min/{1.73_m2} — ABNORMAL LOW (ref >59)
GFR calc non Af Amer: 49 mL/min/{1.73_m2} — ABNORMAL LOW (ref >59)
Globulin, Total: 2.5 g/dL (ref 1.5–4.5)
Glucose: 104 mg/dL — ABNORMAL HIGH (ref 65–99)
Potassium: 4.5 mmol/L (ref 3.5–5.2)
Sodium: 142 mmol/L (ref 134–144)
Total Protein: 6.3 g/dL (ref 6.0–8.5)

## 2018-06-24 LAB — CBC WITH DIFFERENTIAL/PLATELET
Basophils Absolute: 0.1 10*3/uL (ref 0.0–0.2)
Basos: 1 %
EOS (ABSOLUTE): 0.3 10*3/uL (ref 0.0–0.4)
Eos: 4 %
Hematocrit: 36.2 % (ref 34.0–46.6)
Hemoglobin: 12.3 g/dL (ref 11.1–15.9)
IMMATURE GRANULOCYTES: 0 %
Immature Grans (Abs): 0 10*3/uL (ref 0.0–0.1)
LYMPHS: 36 %
Lymphocytes Absolute: 2.8 10*3/uL (ref 0.7–3.1)
MCH: 31.4 pg (ref 26.6–33.0)
MCHC: 34 g/dL (ref 31.5–35.7)
MCV: 92 fL (ref 79–97)
Monocytes Absolute: 0.7 10*3/uL (ref 0.1–0.9)
Monocytes: 9 %
Neutrophils Absolute: 3.7 10*3/uL (ref 1.4–7.0)
Neutrophils: 50 %
Platelets: 208 10*3/uL (ref 150–450)
RBC: 3.92 x10E6/uL (ref 3.77–5.28)
RDW: 12.9 % (ref 11.7–15.4)
WBC: 7.6 10*3/uL (ref 3.4–10.8)

## 2018-06-24 LAB — HEMOGLOBIN A1C
ESTIMATED AVERAGE GLUCOSE: 126 mg/dL
Hgb A1c MFr Bld: 6 % — ABNORMAL HIGH (ref 4.8–5.6)

## 2018-06-24 LAB — LIPID PANEL
Chol/HDL Ratio: 3.9 ratio (ref 0.0–4.4)
Cholesterol, Total: 173 mg/dL (ref 100–199)
HDL: 44 mg/dL (ref >39)
LDL Calculated: 110 mg/dL — ABNORMAL HIGH (ref 0–99)
Triglycerides: 97 mg/dL (ref 0–149)
VLDL Cholesterol Cal: 19 mg/dL (ref 5–40)

## 2018-06-24 LAB — TSH: TSH: 1.05 u[IU]/mL (ref 0.450–4.500)

## 2018-06-25 ENCOUNTER — Telehealth: Payer: Self-pay

## 2018-06-25 NOTE — Telephone Encounter (Signed)
-----   Message from Mar Daring, PA-C sent at 06/25/2018  7:41 AM EST ----- Blood count is normal. Kidney function slightly improved. Liver enzymes are normal. Thyroid normal. Cholesterol fairly steady, slight increase in LDL (bad) cholesterol. Keep working on dietary and lifestyle changes and take atorvastatin. A1c also up some from last year to 6.0 from 5.7. Again really work on [employer] changes and limit carbs and sugars from diet.

## 2018-06-25 NOTE — Telephone Encounter (Signed)
Pt looked at her results on MY Chart and has a question about the 6.0 value of her A1C wanting to know if she is considered pre diabetic  Pt's call back is [phone]  Thanks teri

## 2018-06-25 NOTE — Telephone Encounter (Signed)
LMTCB

## 2018-06-25 NOTE — Telephone Encounter (Signed)
Answered patient questions

## 2018-06-30 ENCOUNTER — Ambulatory Visit (INDEPENDENT_AMBULATORY_CARE_PROVIDER_SITE_OTHER): Payer: Medicare Other

## 2018-06-30 VITALS — BP 124/66 | HR 42 | Temp 98.4°F | Ht 65.0 in | Wt 268.4 lb

## 2018-06-30 DIAGNOSIS — Z0000 Encounter for general adult medical examination without abnormal findings: Secondary | ICD-10-CM | POA: Diagnosis not present

## 2018-06-30 NOTE — Patient Instructions (Addendum)
Mary Kennedy , Thank you for taking time to come for your Medicare Wellness Visit. I appreciate your ongoing commitment to your health goals. Please review the following plan we discussed and let me know if I can assist you in the future.   Screening recommendations/referrals: Colonoscopy: No longer required.  Mammogram: No longer required.  Bone Density: Up to date, due 06/2020 Recommended yearly ophthalmology/optometry visit for glaucoma screening and checkup Recommended yearly dental visit for hygiene and checkup  Vaccinations: Influenza vaccine: Up to date Pneumococcal vaccine: Completed series Tdap vaccine: Up to date, due 06/2020 Shingles vaccine: Pt declines today.     Advanced directives: Advance directive discussed with you today. Even though you declined this today please call our office should you change your mind and we can give you the proper paperwork for you to fill out.  Conditions/risks identified: Obesity-  Continue trying to exercise 3 days a week for at least 30 minutes at a time. Pt to start riding her recumbent bike 3 times a week for at least 30 minutes at a time.   Next appointment: 07/14/18 @ 4:00 PM with Fenton Malling.    Preventive Care 77 Years and Older, Female Preventive care refers to lifestyle choices and visits with your health care provider that can promote health and wellness. What does preventive care include?  A yearly physical exam. This is also called an annual well check.  Dental exams once or twice a year.  Routine eye exams. Ask your health care provider how often you should have your eyes checked.  Personal lifestyle choices, including:  Daily care of your teeth and gums.  Regular physical activity.  Eating a healthy diet.  Avoiding tobacco and drug use.  Limiting alcohol use.  Practicing safe sex.  Taking low-dose aspirin every day.  Taking vitamin and mineral supplements as recommended by your health care provider. What  happens during an annual well check? The services and screenings done by your health care provider during your annual well check will depend on your age, overall health, lifestyle risk factors, and family history of disease. Counseling  Your health care provider may ask you questions about your:  Alcohol use.  Tobacco use.  Drug use.  Emotional well-being.  Home and relationship well-being.  Sexual activity.  Eating habits.  History of falls.  Memory and ability to understand (cognition).  Work and work Statistician.  Reproductive health. Screening  You may have the following tests or measurements:  Height, weight, and BMI.  Blood pressure.  Lipid and cholesterol levels. These may be checked every 5 years, or more frequently if you are over 52 years old.  Skin check.  Lung cancer screening. You may have this screening every year starting at age 53 if you have a 30-pack-year history of smoking and currently smoke or have quit within the past 15 years.  Fecal occult blood test (FOBT) of the stool. You may have this test every year starting at age 63.  Flexible sigmoidoscopy or colonoscopy. You may have a sigmoidoscopy every 5 years or a colonoscopy every 10 years starting at age 31.  Hepatitis C blood test.  Hepatitis B blood test.  Sexually transmitted disease (STD) testing.  Diabetes screening. This is done by checking your blood sugar (glucose) after you have not eaten for a while (fasting). You may have this done every 1-3 years.  Bone density scan. This is done to screen for osteoporosis. You may have this done starting at age 32.  Mammogram. This may be done every 1-2 years. Talk to your health care provider about how often you should have regular mammograms. Talk with your health care provider about your test results, treatment options, and if necessary, the need for more tests. Vaccines  Your health care provider may recommend certain vaccines, such  as:  Influenza vaccine. This is recommended every year.  Tetanus, diphtheria, and acellular pertussis (Tdap, Td) vaccine. You may need a Td booster every 10 years.  Zoster vaccine. You may need this after age 41.  Pneumococcal 13-valent conjugate (PCV13) vaccine. One dose is recommended after age 60.  Pneumococcal polysaccharide (PPSV23) vaccine. One dose is recommended after age 75. Talk to your health care provider about which screenings and vaccines you need and how often you need them. This information is not intended to replace advice given to you by your health care provider. Make sure you discuss any questions you have with your health care provider. Document Released: 05/27/2015 Document Revised: 01/18/2016 Document Reviewed: 03/01/2015 Elsevier Interactive Patient Education  2017 [employer] Prevention in the Home Falls can cause injuries. They can happen to people of all ages. There are many things you can do to make your home safe and to help prevent falls. What can I do on the outside of my home?  Regularly fix the edges of walkways and driveways and fix any cracks.  Remove anything that might make you trip as you walk through a door, such as a raised step or threshold.  Trim any bushes or trees on the path to your home.  Use bright outdoor lighting.  Clear any walking paths of anything that might make someone trip, such as rocks or tools.  Regularly check to see if handrails are loose or broken. Make sure that both sides of any steps have handrails.  Any raised decks and porches should have guardrails on the edges.  Have any leaves, snow, or ice cleared regularly.  Use sand or salt on walking paths during winter.  Clean up any spills in your garage right away. This includes oil or grease spills. What can I do in the bathroom?  Use night lights.  Install grab bars by the toilet and in the tub and shower. Do not use towel bars as grab bars.  Use  non-skid mats or decals in the tub or shower.  If you need to sit down in the shower, use a plastic, non-slip stool.  Keep the floor dry. Clean up any water that spills on the floor as soon as it happens.  Remove soap buildup in the tub or shower regularly.  Attach bath mats securely with double-sided non-slip rug tape.  Do not have throw rugs and other things on the floor that can make you trip. What can I do in the bedroom?  Use night lights.  Make sure that you have a light by your bed that is easy to reach.  Do not use any sheets or blankets that are too big for your bed. They should not hang down onto the floor.  Have a firm chair that has side arms. You can use this for support while you get dressed.  Do not have throw rugs and other things on the floor that can make you trip. What can I do in the kitchen?  Clean up any spills right away.  Avoid walking on wet floors.  Keep items that you use a lot in easy-to-reach places.  If you need to reach  something above you, use a strong step stool that has a grab bar.  Keep electrical cords out of the way.  Do not use floor polish or wax that makes floors slippery. If you must use wax, use non-skid floor wax.  Do not have throw rugs and other things on the floor that can make you trip. What can I do with my stairs?  Do not leave any items on the stairs.  Make sure that there are handrails on both sides of the stairs and use them. Fix handrails that are broken or loose. Make sure that handrails are as long as the stairways.  Check any carpeting to make sure that it is firmly attached to the stairs. Fix any carpet that is loose or worn.  Avoid having throw rugs at the top or bottom of the stairs. If you do have throw rugs, attach them to the floor with carpet tape.  Make sure that you have a light switch at the top of the stairs and the bottom of the stairs. If you do not have them, ask someone to add them for you. What  else can I do to help prevent falls?  Wear shoes that:  Do not have high heels.  Have rubber bottoms.  Are comfortable and fit you well.  Are closed at the toe. Do not wear sandals.  If you use a stepladder:  Make sure that it is fully opened. Do not climb a closed stepladder.  Make sure that both sides of the stepladder are locked into place.  Ask someone to hold it for you, if possible.  Clearly mark and make sure that you can see:  Any grab bars or handrails.  First and last steps.  Where the edge of each step is.  Use tools that help you move around (mobility aids) if they are needed. These include:  Canes.  Walkers.  Scooters.  Crutches.  Turn on the lights when you go into a dark area. Replace any light bulbs as soon as they burn out.  Set up your furniture so you have a clear path. Avoid moving your furniture around.  If any of your floors are uneven, fix them.  If there are any pets around you, be aware of where they are.  Review your medicines with your doctor. Some medicines can make you feel dizzy. This can increase your chance of falling. Ask your doctor what other things that you can do to help prevent falls. This information is not intended to replace advice given to you by your health care provider. Make sure you discuss any questions you have with your health care provider. Document Released: 02/24/2009 Document Revised: 10/06/2015 Document Reviewed: 06/04/2014 Elsevier Interactive Patient Education  2017 [employer].

## 2018-06-30 NOTE — Progress Notes (Addendum)
Subjective:   Mary Kennedy is a 78 y.o. female who presents for Medicare Annual (Subsequent) preventive examination.  Review of Systems:   N/A  Cardiac Risk Factors include: advanced age (>18men, >74 women);dyslipidemia;hypertension;obesity (BMI >30kg/m2)     Objective:     Vitals: BP 124/66 (BP Location: Right Arm)   Pulse (!) 42   Temp 98.4 F (36.9 C) (Oral)   Ht 5\' 5"  (1.651 m)   Wt 268 lb 6.4 oz (121.7 kg)   BMI 44.66 kg/m   Body mass index is 44.66 kg/m.  Advanced Directives 06/30/2018 12/26/2017 08/27/2017 06/24/2017 04/25/2015  Does Patient Have a Medical Advance Directive? No Yes Yes Yes Yes  Type of Advance Directive - Living will - Living will Living will  Would patient like information on creating a medical advance directive? No - Patient declined No - Patient declined - - -    Tobacco Social History   Tobacco Use  Smoking Status Former Smoker  . Types: Cigarettes  . Last attempt to quit: 05/13/1996  . Years since quitting: 22.1  Smokeless Tobacco Never Used     Counseling given: Not Answered   Clinical Intake:  Pre-visit preparation completed: Yes  Pain : No/denies pain Pain Score: 0-No pain     Nutritional Status: BMI > 30  Obese Nutritional Risks: None Diabetes: No  How often do you need to have someone help you when you read instructions, pamphlets, or other written materials from your doctor or pharmacy?: 1 - Never  Interpreter Needed?: No  Information entered by :: [facility], LPN  Past Medical History:  Diagnosis Date  . Allergy   . Heart murmur   . Hyperlipidemia   . Hypertension   . Hypothyroidism   . Thyroid disease    Past Surgical History:  Procedure Laterality Date  . ABDOMINAL HYSTERECTOMY  1961   BIL Oophorectomy  . APPENDECTOMY  at age 64  . BILATERAL CARPAL TUNNEL RELEASE  1999  . CARDIAC CATHETERIZATION    . COLONOSCOPY WITH PROPOFOL N/A 08/27/2017   Procedure: COLONOSCOPY WITH PROPOFOL;  Surgeon: Lucilla Lame, MD;  Location: [facility] ENDOSCOPY;  Service: Endoscopy;  Laterality: N/A;  . FOOT SURGERY Bilateral   . TONSILLECTOMY  at age 67   Family History  Problem Relation Age of Onset  . Pancreatitis Mother   . Drug abuse Brother        PTSD  . Cancer Brother        pancreas lesion  . Heart attack Father   . Lung cancer Brother   . Cancer Brother        lung cancer  . Breast cancer Cousin        2nd cousin   Social History   Socioeconomic History  . Marital status: Single    Spouse name: Not on file  . Number of children: 2  . Years of education: Not on file  . Highest education level: Some college, no degree  Occupational History  . Occupation: Retired    Comment: working Geographical information systems officer  . Financial resource strain: Not hard at all  . Food insecurity:    Worry: Never true    Inability: Never true  . Transportation needs:    Medical: No    Non-medical: No  Tobacco Use  . Smoking status: Former Smoker    Types: Cigarettes    Last attempt to quit: 05/13/1996    Years since quitting: 22.1  . Smokeless tobacco: Never Used  Substance and Sexual Activity  . Alcohol use: Yes    Alcohol/week: 0.0 - 2.0 standard drinks    Comment: occasionally   . Drug use: No  . Sexual activity: Not on file  Lifestyle  . Physical activity:    Days per week: 0 days    Minutes per session: 0 min  . Stress: Only a little  Relationships  . Social connections:    Talks on phone: Patient refused    Gets together: Patient refused    Attends religious service: Patient refused    Active member of club or organization: Patient refused    Attends meetings of clubs or organizations: Patient refused    Relationship status: Patient refused  Other Topics Concern  . Not on file  Social History Narrative  . Not on file    Outpatient Encounter Medications as of 06/30/2018  Medication Sig  . acetaminophen (TYLENOL) 650 MG CR tablet Take 650 mg by mouth every 8 (eight) hours as needed  for pain.  Marland Kitchen albuterol (PROVENTIL HFA;VENTOLIN HFA) 108 (90 Base) MCG/ACT inhaler Inhale 2 puffs into the lungs every 6 (six) hours as needed for wheezing or shortness of breath.  Marland Kitchen atorvastatin (LIPITOR) 20 MG tablet TAKE 1 TABLET BY MOUTH AT  BEDTIME FOR CHOLESTEROL  . Biotin (BIOTIN 5000) 5 MG CAPS Take 5 mg by mouth.  . Calcium Carbonate-Vitamin D 600-200 MG-UNIT TABS Take 2 tablets by mouth daily.  . Chromium Picolinate 1000 MCG TABS Take 1 tablet by mouth daily.  . Coenzyme Q10 100 MG TABS Take 1 capsule by mouth daily.  . Cyanocobalamin (B-12) 2500 MCG TABS Take 2,500 mcg by mouth daily.  . fluticasone (FLONASE) 50 MCG/ACT nasal spray Place 2 sprays into both nostrils daily.  . Glucos-Chond-Hyal Ac-Ca Fructo (MOVE FREE JOINT HEALTH ADVANCE) TABS Take by mouth daily.  . hydrochlorothiazide (HYDRODIURIL) 12.5 MG tablet Take 0.5 tablets (6.25 mg total) by mouth daily.  Marland Kitchen levothyroxine (SYNTHROID, LEVOTHROID) 112 MCG tablet TAKE 1 TABLET BY MOUTH  DAILY BEFORE BREAKFAST  . lisinopril (PRINIVIL,ZESTRIL) 40 MG tablet Take 1 tablet (40 mg total) by mouth daily.  . Magnesium 250 MG TABS Take 1 tablet (250 mg total) by mouth daily. (Patient taking differently: Take 400 mg by mouth daily. )  . MULTIPLE VITAMIN PO Take 1 tablet by mouth daily.   . Multiple Vitamins-Minerals (ICAPS AREDS 2 PO) Take by mouth daily.  . naproxen sodium (ALEVE) 220 MG tablet Take 220 mg by mouth daily as needed.  . Omega-3 Fatty Acids (FISH OIL EXTRA STRENGTH) 1200 MG CAPS Take 1 capsule by mouth daily.  . promethazine-dextromethorphan (PROMETHAZINE-DM) 6.25-15 MG/5ML syrup Take 5 mLs by mouth 4 (four) times daily as needed for cough.  . simethicone (MYLICON) 580 MG chewable tablet Chew 125 mg by mouth daily.  Marland Kitchen triamcinolone cream (KENALOG) 0.1 % Apply 1 application topically 2 (two) times daily.  . vitamin E 400 UNIT capsule Take 400 Units by mouth daily.  . Zinc 50 MG CAPS Take 50 mg by mouth daily.  Marland Kitchen doxycycline  (VIBRA-TABS) 100 MG tablet Take 1 tablet (100 mg total) by mouth 2 (two) times daily. (Patient not taking: Reported on 06/30/2018)   No facility-administered encounter medications on file as of 06/30/2018.     Activities of Daily Living In your present state of health, do you have any difficulty performing the following activities: 06/30/2018  Hearing? N  Vision? N  Comment Wears eye glasses.   Difficulty concentrating or making  decisions? N  Walking or climbing stairs? Y  Comment Due to SOB and knee pains.   Dressing or bathing? N  Doing errands, shopping? N  Preparing Food and eating ? N  Using the Toilet? N  In the past six months, have you accidently leaked urine? Y  Comment Wears protection at all times.   Do you have problems with loss of bowel control? N  Managing your Medications? N  Managing your Finances? N  Housekeeping or managing your Housekeeping? N  Some recent data might be hidden    Patient Care Team: Mar Daring, PA-C as PCP - General (Family Medicine) Birder Robson, MD as Referring Physician (Ophthalmology) Yolonda Kida, MD as Consulting Physician (Cardiology) Iris Pert, [place] as Referring Physician (Chiropractic Medicine) Mosetta Anis, MD as Referring Physician (Allergy)    Assessment:   This is a routine wellness examination for [facility].  Exercise Activities and Dietary recommendations Current Exercise Habits: The patient does not participate in regular exercise at present, Exercise limited by: orthopedic condition(s)  Goals    . Exercise 3x per week (30 min per time)     Recommend some form of exercise 3 days a week for at least 30 minutes. Pt to start swimming and doing eccentrics for 3-4 days a week.   06/30/18: Continue trying to exercise 3 days a week for at least 30 minutes at a time. Pt to start riding her recumbent bike 3 times a week for at least 30 minutes at a time.        Fall Risk Fall Risk  06/30/2018 06/24/2017  06/26/2016 06/23/2015  Falls in the past year? 0 No No No   FALL RISK PREVENTION PERTAINING TO THE HOME: Any stairs in or around the home? Yes  If so, do they handrails? Yes   Home free of loose throw rugs in walkways, pet beds, electrical cords, etc? Yes  Adequate lighting in your home to reduce risk of falls? Yes   ASSISTIVE DEVICES UTILIZED TO PREVENT FALLS:  Life alert? No  Use of a cane, walker or w/c? No  Grab bars in the bathroom? No  Shower chair or bench in shower? No  Elevated toilet seat or a handicapped toilet? Yes    TIMED UP AND GO:  Was the test performed? No .    Depression Screen PHQ 2/9 Scores 06/30/2018 06/24/2017 06/26/2016 06/23/2015  PHQ - 2 Score 2 0 0 0  PHQ- 9 Score 10 - - -     Cognitive Function     6CIT Screen 06/30/2018 06/24/2017  What Year? 0 points 0 points  What month? 0 points 0 points  What time? 0 points 0 points  Count back from 20 0 points 0 points  Months in reverse 2 points 0 points  Repeat phrase 0 points 0 points  Total Score 2 0    Immunization History  Administered Date(s) Administered  . Influenza Split 02/19/2007, 02/27/2011  . Influenza, High Dose Seasonal PF 02/09/2014, 03/15/2016  . Influenza-Unspecified 03/04/2015, 03/05/2017, 02/10/2018  . Pneumococcal Conjugate-13 03/22/2014  . Pneumococcal Polysaccharide-23 11/03/2012  . Tdap 06/20/2010  . Zoster 06/20/2010    Qualifies for Shingles Vaccine? Yes  Zostavax completed 06/20/10. Due for Shingrix. Education has been provided regarding the importance of this vaccine. Pt has been advised to call insurance company to determine out of pocket expense. Advised may also receive vaccine at local pharmacy or Health Dept. Verbalized acceptance and understanding.  Tdap: Up to date  Flu  Vaccine: Up to date  Pneumococcal Vaccine: Up to date  Screening Tests Health Maintenance  Topic Date Due  . DEXA SCAN  06/20/2020  . TETANUS/TDAP  06/20/2020  . INFLUENZA VACCINE  Completed    . PNA vac Low Risk Adult  Completed    Cancer Screenings:  Colorectal Screening: No longer required.   Mammogram: No longer required.   Bone Density: Completed 06/20/10. Results reflect NORMAL. Repeat every 10 years.   Lung Cancer Screening: (Low Dose CT Chest recommended if Age 35-80 years, 30 pack-year currently smoking OR have quit w/in 15years.) does not qualify.    Additional Screening:  Vision Screening: Recommended annual ophthalmology exams for early detection of glaucoma and other disorders of the eye.  Dental Screening: Recommended annual dental exams for proper oral hygiene  Community Resource Referral:  CRR required this visit?  No      Plan:  I have personally reviewed and addressed the Medicare Annual Wellness questionnaire and have noted the following in the patient's chart:  A. Medical and social history B. Use of alcohol, tobacco or illicit drugs  C. Current medications and supplements D. Functional ability and status E.  Nutritional status F.  Physical activity G. Advance directives H. List of other physicians I.  Hospitalizations, surgeries, and ER visits in previous 12 months J.  [place] such as hearing and vision if needed, cognitive and depression L. Referrals and appointments - none  In addition, I have reviewed and discussed with patient certain preventive protocols, quality metrics, and best practice recommendations. A written personalized care plan for preventive services as well as general preventive health recommendations were provided to patient.  See attached scanned questionnaire for additional information.   Signed,  Fabio Neighbors, LPN Nurse Health Advisor   Nurse Recommendations: None.

## 2018-07-14 ENCOUNTER — Encounter: Payer: Self-pay | Admitting: Physician Assistant

## 2018-07-14 ENCOUNTER — Ambulatory Visit (INDEPENDENT_AMBULATORY_CARE_PROVIDER_SITE_OTHER): Payer: Medicare Other | Admitting: Physician Assistant

## 2018-07-14 VITALS — BP 171/81 | HR 66 | Temp 97.8°F | Resp 16 | Wt 269.0 lb

## 2018-07-14 DIAGNOSIS — Z803 Family history of malignant neoplasm of breast: Secondary | ICD-10-CM | POA: Diagnosis not present

## 2018-07-14 DIAGNOSIS — Z1239 Encounter for other screening for malignant neoplasm of breast: Secondary | ICD-10-CM | POA: Diagnosis not present

## 2018-07-14 DIAGNOSIS — Z0000 Encounter for general adult medical examination without abnormal findings: Secondary | ICD-10-CM | POA: Diagnosis not present

## 2018-07-14 NOTE — Progress Notes (Signed)
[name], Female    DOB: 01/24/1941, 78 y.o.   MRN: [id number] Visit Date: 07/14/2018  Today's Provider: Mar Daring, PA-C   Chief Complaint  Patient presents with  . Annual Exam   Subjective:     I,Mary Kennedy,acting as a scribe for [employer], PA-C.,have documented all relevant documentation on the behalf of Mar Daring, PA-C,as directed by  Mar Daring, PA-C    Complete Physical Mary Kennedy is a 78 y.o. female. She feels fairly well. She reports exercising none. She reports she is sleeping fairly well. 06/30/18 Medicare wellness exam 02/29/19 CPE 08/14/17 Mammogram-BI-RADS 1 06/20/10 BMD-Normal 08/27/17 Colonoscopy-Polyps, diverticulosis. Sessile serrated adenoma -----------------------------------------------------------   Review of Systems  Constitutional: Negative.   HENT: Positive for congestion, postnasal drip and sinus pressure.   Eyes: Negative.   Respiratory: Positive for chest tightness and shortness of breath.   Cardiovascular: Negative.   Gastrointestinal: Negative.   Endocrine: Positive for heat intolerance, polydipsia and polyuria.  Genitourinary: Negative.   Musculoskeletal: Positive for arthralgias and back pain.  Skin: Negative.   Allergic/Immunologic: Negative.   Neurological: Negative.   Hematological: Bruises/bleeds easily.  Psychiatric/Behavioral: Positive for sleep disturbance.    Social History   Socioeconomic History  . Marital status: Single    Spouse name: Not on file  . Number of children: 2  . Years of education: Not on file  . Highest education level: Some college, no degree  Occupational History  . Occupation: Retired    Comment: working Geographical information systems officer  . Financial resource strain: Not hard at all  . Food insecurity:    Worry: Never true    Inability: Never true  . Transportation needs:    Medical: No    Non-medical: No  Tobacco Use  . Smoking  status: Former Smoker    Types: Cigarettes    Last attempt to quit: 05/13/1996    Years since quitting: 22.1  . Smokeless tobacco: Never Used  Substance and Sexual Activity  . Alcohol use: Yes    Alcohol/week: 0.0 - 2.0 standard drinks    Comment: occasionally   . Drug use: No  . Sexual activity: Not on file  Lifestyle  . Physical activity:    Days per week: 0 days    Minutes per session: 0 min  . Stress: Only a little  Relationships  . Social connections:    Talks on phone: Patient refused    Gets together: Patient refused    Attends religious service: Patient refused    Active member of club or organization: Patient refused    Attends meetings of clubs or organizations: Patient refused    Relationship status: Patient refused  . Intimate partner violence:    Fear of current or ex partner: Patient refused    Emotionally abused: Patient refused    Physically abused: Patient refused    Forced sexual activity: Patient refused  Other Topics Concern  . Not on file  Social History Narrative  . Not on file    Past Medical History:  Diagnosis Date  . Allergy   . Heart murmur   . Hyperlipidemia   . Hypertension   . Hypothyroidism   . Thyroid disease      Patient Active Problem List   Diagnosis Date Noted  . Benign neoplasm of cecum   . Benign neoplasm of ascending colon   . Bradycardia 04/25/2015  . H/O cardiovascular disorder 12/01/2014  .  LBP (low back pain) 12/01/2014  . Adenopathy 12/01/2014  . Atypical squamous cells of undetermined significance on cytologic smear of vagina (ASC-US) 12/01/2014  . Adenitis, salivary, recurring 12/01/2014  . Absence of bladder continence 12/01/2014  . Bursitis of knee 10/21/2013  . Arthritis of knee, degenerative 09/30/2013  . Carotid artery obstruction 04/20/2009  . Urinary system disease 10/19/2008  . Obesity, Class III, BMI 40-49.9 (morbid obesity) ([place]) 10/19/2008  . Family history of cardiovascular disease 07/06/2007  .  Mitral regurgitation 02/23/2007  . History of colonic polyps 02/23/2007  . Benign hypertension 02/23/2007  . Bladder compliance low 02/23/2007  . Acne erythematosa 02/23/2007  . Menopausal symptom 02/23/2007  . Hyperlipidemia 02/19/2007  . Adult hypothyroidism 02/19/2007    Past Surgical History:  Procedure Laterality Date  . ABDOMINAL HYSTERECTOMY  1961   BIL Oophorectomy  . APPENDECTOMY  at age 64  . BILATERAL CARPAL TUNNEL RELEASE  1999  . CARDIAC CATHETERIZATION    . COLONOSCOPY WITH PROPOFOL N/A 08/27/2017   Procedure: COLONOSCOPY WITH PROPOFOL;  Surgeon: Lucilla Lame, MD;  [facility] END[facility]COPY;  Service: Endoscopy;  Laterality: N/A;  . FOOT SURGERY Bilateral   . TONSILLECTOMY  at age 80    Her family history includes Breast cancer in her cousin; Cancer in her brother and brother; Drug abuse in her brother; Heart attack in her father; Lung cancer in her brother; Pancreatitis in her mother.   Current Outpatient Medications:  .  acetaminophen (TYLENOL) 650 MG CR tablet, Take 650 mg by mouth every 8 (eight) hours as needed for pain., Disp: , Rfl:  .  albuterol (PROVENTIL HFA;VENTOLIN HFA) 108 (90 Base) MCG/ACT inhaler, Inhale 2 puffs into the lungs every 6 (six) hours as needed for wheezing or shortness of breath., Disp: 1 Inhaler, Rfl: 0 .  atorvastatin (LIPITOR) 20 MG tablet, TAKE 1 TABLET BY MOUTH AT  BEDTIME FOR CHOLESTEROL, Disp: 90 tablet, Rfl: 3 .  Biotin (BIOTIN 5000) 5 MG CAPS, Take 5 mg by mouth., Disp: , Rfl:  .  Calcium Carbonate-Vitamin D 600-200 MG-UNIT TABS, Take 2 tablets by mouth daily., Disp: , Rfl:  .  Chromium Picolinate 1000 MCG TABS, Take 1 tablet by mouth daily., Disp: , Rfl:  .  Coenzyme Q10 100 MG TABS, Take 1 capsule by mouth daily., Disp: , Rfl:  .  Cyanocobalamin (B-12) 2500 MCG TABS, Take 2,500 mcg by mouth daily., Disp: , Rfl:  .  fluticasone (FLONASE) 50 MCG/ACT nasal spray, Place 2 sprays into both nostrils daily., Disp: 16 g, Rfl: 6 .   Glucos-Chond-Hyal Ac-Ca Fructo (MOVE FREE JOINT HEALTH ADVANCE) TABS, Take by mouth daily., Disp: , Rfl:  .  hydrochlorothiazide (HYDRODIURIL) 12.5 MG tablet, Take 0.5 tablets (6.25 mg total) by mouth daily., Disp: 45 tablet, Rfl: 3 .  levothyroxine (SYNTHROID, LEVOTHROID) 112 MCG tablet, TAKE 1 TABLET BY MOUTH  DAILY BEFORE BREAKFAST, Disp: 90 tablet, Rfl: 3 .  lisinopril (PRINIVIL,ZESTRIL) 40 MG tablet, Take 1 tablet (40 mg total) by mouth daily., Disp: 90 tablet, Rfl: 3 .  Magnesium 250 MG TABS, Take 1 tablet (250 mg total) by mouth daily. (Patient taking differently: Take 400 mg by mouth daily. ), Disp: 30 tablet, Rfl: 0 .  MULTIPLE VITAMIN PO, Take 1 tablet by mouth daily. , Disp: , Rfl:  .  Multiple Vitamins-Minerals (ICAPS AREDS 2 PO), Take by mouth daily., Disp: , Rfl:  .  naproxen sodium (ALEVE) 220 MG tablet, Take 220 mg by mouth daily as needed., Disp: ,  Rfl:  .  Omega-3 Fatty Acids (FISH OIL EXTRA STRENGTH) 1200 MG CAPS, Take 1 capsule by mouth daily., Disp: , Rfl:  .  simethicone (MYLICON) 122 MG chewable tablet, Chew 125 mg by mouth daily., Disp: , Rfl:  .  triamcinolone cream (KENALOG) 0.1 %, Apply 1 application topically 2 (two) times daily., Disp: 80 g, Rfl: 1 .  vitamin E 400 UNIT capsule, Take 400 Units by mouth daily., Disp: , Rfl:  .  Zinc 50 MG CAPS, Take 50 mg by mouth daily., Disp: , Rfl:   Patient Care Team: Mar Daring, PA-C as PCP - General (Family Medicine) Birder Robson, MD as Referring Physician (Ophthalmology) Yolonda Kida, MD as Consulting Physician (Cardiology) Iris Pert, [place] as Referring Physician (Chiropractic Medicine) Mosetta Anis, MD as Referring Physician (Allergy)     Objective:    Vitals: BP (!) 171/81 (BP Location: Left Arm, Patient Position: Sitting, Cuff Size: Large)   Pulse 66   Temp 97.8 F (36.6 C) (Oral)   Resp 16   Wt 269 lb (122 kg)   SpO2 95%   BMI 44.76 kg/m   Physical Exam Vitals signs reviewed.    Constitutional:      General: She is not in acute distress.    Appearance: Normal appearance. She is well-developed. She is obese. She is not diaphoretic.  HENT:     Head: Normocephalic and atraumatic.     Right Ear: Tympanic membrane, ear canal and external ear normal.     Left Ear: Tympanic membrane, ear canal and external ear normal.     Nose: Nose normal.     Mouth/Throat:     Pharynx: No oropharyngeal exudate.  Eyes:     General: No scleral icterus.       Right eye: No discharge.        Left eye: No discharge.     Conjunctiva/sclera: Conjunctivae normal.     Pupils: Pupils are equal, round, and reactive to light.  Neck:     Musculoskeletal: Normal range of motion and neck supple.     Thyroid: No thyromegaly.     Vascular: No carotid bruit or JVD.     Trachea: No tracheal deviation.  Cardiovascular:     Rate and Rhythm: Normal rate and regular rhythm.     Heart sounds: Normal heart sounds. No murmur. No friction rub. No gallop.   Pulmonary:     Effort: Pulmonary effort is normal. No respiratory distress.     Breath sounds: Normal breath sounds. No wheezing or rales.  Chest:     Chest wall: No tenderness.  Abdominal:     General: Bowel sounds are normal. There is no distension.     Palpations: Abdomen is soft. There is no mass.     Tenderness: There is no abdominal tenderness. There is no guarding or rebound.  Musculoskeletal: Normal range of motion.        General: No tenderness.  Lymphadenopathy:     Cervical: No cervical adenopathy.  Skin:    General: Skin is warm and dry.     Findings: No rash.  Neurological:     Mental Status: She is alert and oriented to person, place, and time.  Psychiatric:        Behavior: Behavior normal.        Thought Content: Thought content normal.        Judgment: Judgment normal.     Activities of Daily Living In your present state of  health, do you have any difficulty performing the following activities: 06/30/2018  Hearing? N   Vision? N  Comment Wears eye glasses.   Difficulty concentrating or making decisions? N  Walking or climbing stairs? Y  Comment Due to SOB and knee pains.   Dressing or bathing? N  Doing errands, shopping? N  Preparing Food and eating ? N  Using the Toilet? N  In the past six months, have you accidently leaked urine? Y  Comment Wears protection at all times.   Do you have problems with loss of bowel control? N  Managing your Medications? N  Managing your Finances? N  Housekeeping or managing your Housekeeping? N  Some recent data might be hidden    Fall Risk Assessment Fall Risk  06/30/2018 06/24/2017 06/26/2016 06/23/2015  Falls in the past year? 0 No No No     Depression Screen PHQ 2/9 Scores 06/30/2018 06/24/2017 06/26/2016 06/23/2015  PHQ - 2 Score 2 0 0 0  PHQ- 9 Score 10 - - -    6CIT Screen 06/30/2018  What Year? 0 points  What month? 0 points  What time? 0 points  Count back from 20 0 points  Months in reverse 2 points  Repeat phrase 0 points  Total Score 2       Assessment & Plan:    Annual Physical Reviewed patient's Family Medical History Reviewed and updated list of patient's medical providers Assessment of cognitive impairment was done Assessed patient's functional ability Established a written schedule for health screening [place] Completed and Reviewed  Exercise Activities and Dietary recommendations Goals    . Exercise 3x per week (30 min per time)     Recommend some form of exercise 3 days a week for at least 30 minutes. Pt to start swimming and doing eccentrics for 3-4 days a week.   06/30/18: Continue trying to exercise 3 days a week for at least 30 minutes at a time. Pt to start riding her recumbent bike 3 times a week for at least 30 minutes at a time.        Immunization History  Administered Date(s) Administered  . Influenza Split 02/19/2007, 02/27/2011  . Influenza, High Dose Seasonal PF 02/09/2014, 03/15/2016  .  Influenza-Unspecified 03/04/2015, 03/05/2017, 02/10/2018  . Pneumococcal Conjugate-13 03/22/2014  . Pneumococcal Polysaccharide-23 11/03/2012  . Tdap 06/20/2010  . Zoster 06/20/2010    Health Maintenance  Topic Date Due  . DEXA SCAN  06/20/2020  . TETANUS/TDAP  06/20/2020  . INFLUENZA VACCINE  Completed  . PNA vac Low Risk Adult  Completed     Discussed health benefits of physical activity, and encouraged her to engage in regular exercise appropriate for her age and condition.    1. Annual physical exam Normal exam. Labs reviewed. Up to date on screenings and vaccinations.   2. Breast cancer screening Breast exam today was normal. There is no family history of breast cancer. She does perform regular self breast exams. Mammogram was ordered as below. Information for [facility] Breast clinic was given to patient so she may schedule her mammogram at her convenience. - MM 3D SCREEN BREAST BILATERAL; Future  3. Family history of breast cancer in female Family history of breast cancer in a cousin. - MM 3D SCREEN BREAST BILATERAL; Future  ------------------------------------------------------------------------------------------------------------    Mar Daring, PA-C  [facility]

## 2018-07-14 NOTE — Patient Instructions (Signed)
Health Maintenance After Age 77 After age 77, you are at a higher risk for certain long-term diseases and infections as well as injuries from falls. Falls are a major cause of broken bones and head injuries in people who are older than age 77. Getting regular preventive care can help to keep you healthy and well. Preventive care includes getting regular testing and making lifestyle changes as recommended by your health care provider. Talk with your health care provider about:  Which screenings and tests you should have. A screening is a test that checks for a disease when you have no symptoms.  A diet and exercise plan that is right for you. What should I know about screenings and tests to prevent falls? Screening and testing are the best ways to find a health problem early. Early diagnosis and treatment give you the best chance of managing medical conditions that are common after age 77. Certain conditions and lifestyle choices may make you more likely to have a fall. Your health care provider may recommend:  Regular vision checks. Poor vision and conditions such as cataracts can make you more likely to have a fall. If you wear glasses, make sure to get your prescription updated if your vision changes.  Medicine review. Work with your health care provider to regularly review all of the medicines you are taking, including over-the-counter medicines. Ask your health care provider about any side effects that may make you more likely to have a fall. Tell your health care provider if any medicines that you take make you feel dizzy or sleepy.  Osteoporosis screening. Osteoporosis is a condition that causes the bones to get weaker. This can make the bones weak and cause them to break more easily.  Blood pressure screening. Blood pressure changes and medicines to control blood pressure can make you feel dizzy.  Strength and balance checks. Your health care provider may recommend certain tests to check your  strength and balance while standing, walking, or changing positions.  Foot health exam. Foot pain and numbness, as well as not wearing proper footwear, can make you more likely to have a fall.  Depression screening. You may be more likely to have a fall if you have a fear of falling, feel emotionally low, or feel unable to do activities that you used to do.  Alcohol use screening. Using too much alcohol can affect your balance and may make you more likely to have a fall. What actions can I take to lower my risk of falls? General instructions  Talk with your health care provider about your risks for falling. Tell your health care provider if: ? You fall. Be sure to tell your health care provider about all falls, even ones that seem minor. ? You feel dizzy, sleepy, or off-balance.  Take over-the-counter and prescription medicines only as told by your health care provider. These include any supplements.  Eat a healthy diet and maintain a healthy weight. A healthy diet includes low-fat dairy products, low-fat (lean) meats, and fiber from whole grains, beans, and lots of fruits and vegetables. Home safety  Remove any tripping hazards, such as rugs, cords, and clutter.  Install safety equipment such as grab bars in bathrooms and safety rails on stairs.  Keep rooms and walkways well-lit. Activity   Follow a regular exercise program to stay fit. This will help you maintain your balance. Ask your health care provider what types of exercise are appropriate for you.  If you need a cane or   walker, use it as recommended by your health care provider.  Wear supportive shoes that have nonskid soles. Lifestyle  Do not drink alcohol if your health care provider tells you not to drink.  If you drink alcohol, limit how much you have: ? 0-1 drink a day for women. ? 0-2 drinks a day for men.  Be aware of how much alcohol is in your drink. In the [place] one drink equals one typical bottle of beer (12  oz), one-half glass of wine (5 oz), or one shot of hard liquor (1 oz).  Do not use any products that contain nicotine or tobacco, such as cigarettes and e-cigarettes. If you need help quitting, ask your health care provider. Summary  Having a healthy lifestyle and getting preventive care can help to protect your health and wellness after age 77.  Screening and testing are the best way to find a health problem early and help you avoid having a fall. Early diagnosis and treatment give you the best chance for managing medical conditions that are more common for people who are older than age 77.  Falls are a major cause of broken bones and head injuries in people who are older than age 77. Take precautions to prevent a fall at home.  Work with your health care provider to learn what changes you can make to improve your health and wellness and to prevent falls. This information is not intended to replace advice given to you by your health care provider. Make sure you discuss any questions you have with your health care provider. Document Released: 03/13/2017 Document Revised: 03/13/2017 Document Reviewed: 03/13/2017 Elsevier Interactive Patient Education  2019 Elsevier Inc.  

## 2018-07-15 ENCOUNTER — Telehealth: Payer: Self-pay | Admitting: Physician Assistant

## 2018-07-15 DIAGNOSIS — R0602 Shortness of breath: Secondary | ICD-10-CM

## 2018-07-15 MED ORDER — TIOTROPIUM BROMIDE MONOHYDRATE 2.5 MCG/ACT IN AERS: 1.0000 | INHALATION_SPRAY | Freq: Every day | RESPIRATORY_TRACT | 5 refills | Status: DC

## 2018-07-15 NOTE — Telephone Encounter (Signed)
Wonderful! I will send in Rx for her.

## 2018-07-15 NOTE — Telephone Encounter (Signed)
Pt wanting to let Tawanna Sat know Inhaler / Puffer worked great for last nights sleep.  Thanks, [employer]

## 2018-08-04 ENCOUNTER — Telehealth: Payer: Self-pay

## 2018-08-04 NOTE — Telephone Encounter (Signed)
Montelukast is safe to start. Recommend only using seasonally with allergy exacerbations

## 2018-08-04 NOTE — Telephone Encounter (Signed)
Patient advised as below.  

## 2018-08-04 NOTE — Telephone Encounter (Signed)
Patient states that she possible have a sinus infection or allergies. Following symptoms are: cough and congestion. Patient was giving Montelukast 10 mg daily from the [facility], but has not started the medication because she wanted to check with you to make sure it was okay to started the new medication.  Please advise.

## 2018-08-25 ENCOUNTER — Other Ambulatory Visit: Payer: Self-pay | Admitting: Physician Assistant

## 2018-08-25 DIAGNOSIS — I10 Essential (primary) hypertension: Secondary | ICD-10-CM

## 2018-09-01 ENCOUNTER — Other Ambulatory Visit: Payer: Self-pay | Admitting: Physician Assistant

## 2018-09-01 ENCOUNTER — Telehealth: Payer: Self-pay | Admitting: Physician Assistant

## 2018-09-01 DIAGNOSIS — E039 Hypothyroidism, unspecified: Secondary | ICD-10-CM

## 2018-09-01 DIAGNOSIS — I10 Essential (primary) hypertension: Secondary | ICD-10-CM

## 2018-09-01 NOTE — Telephone Encounter (Signed)
Pt called saying she went to the allergist and they did the allergy testing.  She said nothing showed up.  She said she read about the Lisinopril 40 mg and she thinks all of her problems that seem like allergies are coming from taking the lisinopril.  Pt's CB# [phone]  Thanks Con Mem[employer]

## 2018-09-02 NOTE — Telephone Encounter (Signed)
Please review. Thanks!  

## 2018-09-03 MED ORDER — LOSARTAN POTASSIUM 100 MG PO TABS: 100.0000 mg | ORAL_TABLET | Freq: Every day | ORAL | 0 refills | Status: DC

## 2018-09-03 NOTE — Telephone Encounter (Signed)
Patient was advised and scheduled a follow up appt.

## 2018-09-03 NOTE — Telephone Encounter (Signed)
Will change to losartan 100mg  (dose equivalent). Will send only 30 days to [facility], She will need either in office or evisit for BP f/u in 2-4 weeks to make sure new medication is tolerated and controlling BP.

## 2018-09-22 ENCOUNTER — Encounter: Payer: Self-pay | Admitting: Physician Assistant

## 2018-09-22 ENCOUNTER — Other Ambulatory Visit: Payer: Self-pay

## 2018-09-22 ENCOUNTER — Ambulatory Visit (INDEPENDENT_AMBULATORY_CARE_PROVIDER_SITE_OTHER): Payer: Medicare Other | Admitting: Physician Assistant

## 2018-09-22 VITALS — BP 153/73 | HR 35 | Temp 98.2°F | Resp 16 | Wt 270.0 lb

## 2018-09-22 DIAGNOSIS — I4510 Unspecified right bundle-branch block: Secondary | ICD-10-CM

## 2018-09-22 DIAGNOSIS — R001 Bradycardia, unspecified: Secondary | ICD-10-CM

## 2018-09-22 DIAGNOSIS — I10 Essential (primary) hypertension: Secondary | ICD-10-CM

## 2018-09-22 MED ORDER — HYDROCHLOROTHIAZIDE 12.5 MG PO TABS: 12.5000 mg | ORAL_TABLET | Freq: Every day | ORAL | 1 refills | Status: DC

## 2018-09-22 MED ORDER — LOSARTAN POTASSIUM 100 MG PO TABS: 100.0000 mg | ORAL_TABLET | Freq: Every day | ORAL | 1 refills | Status: DC

## 2018-09-22 NOTE — Patient Instructions (Signed)
Losartan tablets  What is this medicine?  LOSARTAN (loe SAR tan) is used to treat high blood pressure and to reduce the risk of stroke in certain patients. This drug also slows the progression of kidney disease in patients with diabetes.  This medicine may be used for other purposes; ask your health care provider or pharmacist if you have questions.  COMMON BRAND NAME(S): Cozaar  What should I tell my health care provider before I take this medicine?  They need to know if you have any of these conditions:  -heart failure  -kidney or liver disease  -an unusual or allergic reaction to losartan, other medicines, foods, dyes, or preservatives  -pregnant or trying to get pregnant  -breast-feeding  How should I use this medicine?  Take this medicine by mouth with a glass of water. Follow the directions on the prescription label. This medicine can be taken with or without food. Take your doses at regular intervals. Do not take your medicine more often than directed.  Talk to your pediatrician regarding the use of this medicine in children. Special care may be needed.  Overdosage: If you think you have taken too much of this medicine contact a poison control center or emergency room at once.  NOTE: This medicine is only for you. Do not share this medicine with others.  What if I miss a dose?  If you miss a dose, take it as soon as you can. If it is almost time for your next dose, take only that dose. Do not take double or extra doses.  What may interact with this medicine?  -blood pressure medicines  -diuretics, especially triamterene, spironolactone, or amiloride  -fluconazole  -NSAIDs, medicines for pain and inflammation, like ibuprofen or naproxen  -potassium salts or potassium supplements  -rifampin  This list may not describe all possible interactions. Give your health care provider a list of all the medicines, herbs, non-prescription drugs, or dietary supplements you use. Also tell them if you smoke, drink alcohol, or  use illegal drugs. Some items may interact with your medicine.  What should I watch for while using this medicine?  Visit your doctor or health care professional for regular checks on your progress. Check your blood pressure as directed. Ask your doctor or health care professional what your blood pressure should be and when you should contact him or her. Call your doctor or health care professional if you notice an irregular or fast heart beat.  Women should inform their doctor if they wish to become pregnant or think they might be pregnant. There is a potential for serious side effects to an unborn child, particularly in the second or third trimester. Talk to your health care professional or pharmacist for more information.  You may get drowsy or dizzy. Do not drive, use machinery, or do anything that needs mental alertness until you know how this drug affects you. Do not stand or sit up quickly, especially if you are an older patient. This reduces the risk of dizzy or fainting spells. Alcohol can make you more drowsy and dizzy. Avoid alcoholic drinks.  Avoid salt substitutes unless you are told otherwise by your doctor or health care professional.  Do not treat yourself for coughs, colds, or pain while you are taking this medicine without asking your doctor or health care professional for advice. Some ingredients may increase your blood pressure.  What side effects may I notice from receiving this medicine?  Side effects that you should report to   your doctor or health care professional as soon as possible:  -confusion, dizziness, light headedness or fainting spells  -decreased amount of urine passed  -difficulty breathing or swallowing, hoarseness, or tightening of the throat  -fast or irregular heart beat, palpitations, or chest pain  -skin rash, itching  -swelling of your face, lips, tongue, hands, or feet  Side effects that usually do not require medical attention (report to your doctor or health care  professional if they continue or are bothersome):  -cough  -decreased sexual function or desire  -headache  -nasal congestion or stuffiness  -nausea or stomach pain  -sore or cramping muscles  This list may not describe all possible side effects. Call your doctor for medical advice about side effects. You may report side effects to FDA at 1-800-FDA-1088.  Where should I keep my medicine?  Keep out of the reach of children.  Store at room temperature between 15 and 30 degrees C (59 and 86 degrees F). Protect from light. Keep container tightly closed. Throw away any unused medicine after the expiration date.  NOTE: This sheet is a summary. It may not cover all possible information. If you have questions about this medicine, talk to your doctor, pharmacist, or health care provider.   2019 Elsevier/Gold Standard (2007-07-11 16:42:18)

## 2018-09-22 NOTE — Progress Notes (Signed)
[name] Female    DOB: May 01, 1941   78 y.o.   MRN: [id number] Visit Date: 09/22/2018  Today's Provider: Mar Daring, PA-C   Chief Complaint  Patient presents with  . Follow-up  . Hypertension   Subjective:     HPI   Benign hypertension From 06/20/2018-labs checked, advised diet and exercise.  Adult hypothyroidism From 06/20/2018-labs checked, advised diet and exercise.  Pure hypercholesterolemia From 06/20/2018-labs checked, advised diet and exercise.   She was having cough and congestion from lisinopril so medication was changed to losartan 3-4 weeks ago. 3 days after stopping lisniopril cough improved. BP has been less controlled with losartan and reports "readings are all over the place."  Allergies  Allergen Reactions  . Ciprofloxacin   . Tegaderm Ag Mesh 2"X2"  [Wound Dressings]     when removing the patch her skin came off.  . Latex Rash  . Penicillins Rash     Current Outpatient Medications:  .  acetaminophen (TYLENOL) 650 MG CR tablet, Take 650 mg by mouth every 8 (eight) hours as needed for pain., Disp: , Rfl:  .  albuterol (PROVENTIL HFA;VENTOLIN HFA) 108 (90 Base) MCG/ACT inhaler, Inhale 2 puffs into the lungs every 6 (six) hours as needed for wheezing or shortness of breath., Disp: 1 Inhaler, Rfl: 0 .  atorvastatin (LIPITOR) 20 MG tablet, TAKE 1 TABLET BY MOUTH AT  BEDTIME FOR CHOLESTEROL, Disp: 90 tablet, Rfl: 3 .  Biotin (BIOTIN 5000) 5 MG CAPS, Take 5 mg by mouth., Disp: , Rfl:  .  Calcium Carbonate-Vitamin D 600-200 MG-UNIT TABS, Take 2 tablets by mouth daily., Disp: , Rfl:  .  Chromium Picolinate 1000 MCG TABS, Take 1 tablet by mouth daily., Disp: , Rfl:  .  Coenzyme Q10 100 MG TABS, Take 1 capsule by mouth daily., Disp: , Rfl:  .  Cyanocobalamin (B-12) 2500 MCG TABS, Take 2,500 mcg by mouth daily., Disp: , Rfl:  .  fluticasone (FLONASE) 50 MCG/ACT nasal spray, Place 2 sprays into both nostrils daily., Disp: 16 g, Rfl: 6 .   Glucos-Chond-Hyal Ac-Ca Fructo (MOVE FREE JOINT HEALTH ADVANCE) TABS, Take by mouth daily., Disp: , Rfl:  .  hydrochlorothiazide (HYDRODIURIL) 12.5 MG tablet, TAKE ONE-HALF TABLET BY  MOUTH DAILY, Disp: 45 tablet, Rfl: 3 .  levothyroxine (SYNTHROID) 112 MCG tablet, TAKE 1 TABLET BY MOUTH  DAILY BEFORE BREAKFAST, Disp: 90 tablet, Rfl: 3 .  losartan (COZAAR) 100 MG tablet, Take 1 tablet (100 mg total) by mouth daily., Disp: 30 tablet, Rfl: 0 .  Magnesium 250 MG TABS, Take 1 tablet (250 mg total) by mouth daily. (Patient taking differently: Take 400 mg by mouth daily. ), Disp: 30 tablet, Rfl: 0 .  MULTIPLE VITAMIN PO, Take 1 tablet by mouth daily. , Disp: , Rfl:  .  Multiple Vitamins-Minerals (ICAPS AREDS 2 PO), Take by mouth daily., Disp: , Rfl:  .  naproxen sodium (ALEVE) 220 MG tablet, Take 220 mg by mouth daily as needed., Disp: , Rfl:  .  Omega-3 Fatty Acids (FISH OIL EXTRA STRENGTH) 1200 MG CAPS, Take 1 capsule by mouth daily., Disp: , Rfl:  .  simethicone (MYLICON) 585 MG chewable tablet, Chew 125 mg by mouth daily., Disp: , Rfl:  .  Tiotropium Bromide Monohydrate (SPIRIVA RESPIMAT) 2.5 MCG/ACT AERS, Inhale 1 puff into the lungs daily., Disp: 4 g, Rfl: 5 .  triamcinolone cream (KENALOG) 0.1 %, Apply 1 application topically 2 (two) times daily.,  Disp: 80 g, Rfl: 1 .  vitamin E 400 UNIT capsule, Take 400 Units by mouth daily., Disp: , Rfl:  .  Zinc 50 MG CAPS, Take 50 mg by mouth daily., Disp: , Rfl:   Review of Systems  Constitutional: Negative for appetite change, chills, fatigue and fever.  Respiratory: Negative for cough, chest tightness and shortness of breath.   Cardiovascular: Negative for chest pain and palpitations.  Gastrointestinal: Negative for abdominal pain, nausea and vomiting.  Neurological: Negative for dizziness and weakness.    Social History   Tobacco Use  . Smoking status: Former Smoker    Types: Cigarettes    Last attempt to quit: 05/13/1996    Years since  quitting: 22.3  . Smokeless tobacco: Never Used  Substance Use Topics  . Alcohol use: Yes    Alcohol/week: 0.0 - 2.0 standard drinks    Comment: occasionally       Objective:   BP (!) 153/73 (BP Location: Left Wrist, Patient Position: Sitting, Cuff Size: Large)   Pulse (!) 35   Temp 98.2 F (36.8 C) (Oral)   Resp 16   Wt 270 lb (122.5 kg)   SpO2 97%   BMI 44.93 kg/m  Vitals:   09/22/18 1055  BP: (!) 153/73  Pulse: (!) 35  Resp: 16  Temp: 98.2 F (36.8 C)  TempSrc: Oral  SpO2: 97%  Weight: 270 lb (122.5 kg)     Physical Exam Vitals signs reviewed.  Constitutional:      General: She is not in acute distress.    Appearance: Normal appearance. She is well-developed. She is obese. She is not ill-appearing or diaphoretic.  Neck:     Musculoskeletal: Normal range of motion and neck supple.     Thyroid: No thyromegaly.     Vascular: No JVD.     Trachea: No tracheal deviation.  Cardiovascular:     Rate and Rhythm: Regular rhythm. Bradycardia present.     Heart sounds: Normal heart sounds. No murmur. No friction rub. No gallop.   Pulmonary:     Effort: Pulmonary effort is normal. No respiratory distress.     Breath sounds: Normal breath sounds. No wheezing or rales.  Musculoskeletal:     Right lower leg: No edema.     Left lower leg: No edema.  Lymphadenopathy:     Cervical: No cervical adenopathy.  Skin:    Capillary Refill: Capillary refill takes less than 2 seconds.  Neurological:     Mental Status: She is alert.       Office Visit from 09/22/2018 in [facility]  PHQ-9 Total Score  6        Assessment & Plan    1. Bradycardia HR showed to be 35 on automatic cuff. EKG obtained and shows accurate HR of 60-63 with RBBB. This is stable and unchanged.  - EKG 12-Lead  2. RBBB (right bundle branch block) See above medical treatment plan.  3. Benign hypertension BP slightly up today in the office. Home readings range from 120s-140s/50s-80s.  Patient desires to continue losartan at this time, but will increase HCTZ to full tab 12.5mg  (was previously taking half tab). She will call if BP readings remain variable over the next 2-3 weeks and if she desires a change.  - losartan (COZAAR) 100 MG tablet; Take 1 tablet (100 mg total) by mouth daily.  Dispense: 90 tablet; Refill: 1 - hydrochlorothiazide (HYDRODIURIL) 12.5 MG tablet; Take 1 tablet (12.5 mg total) by mouth daily.  Dispense: 90 tablet; Refill: [place], PA-C  [facility]

## 2018-09-22 NOTE — Telephone Encounter (Signed)
Patient is calling office requesting that we send in prescription refill request to [facility]. KW

## 2018-10-03 ENCOUNTER — Telehealth: Payer: Self-pay | Admitting: Physician Assistant

## 2018-10-03 NOTE — Telephone Encounter (Signed)
Pt wants to know if her underling conditions should keep her from going back to work May 26th.  She does not want a note saying she has to be out.  She just wants an opinion.  She is a massage therapist.  [phone]  thanks teri

## 2018-10-07 NOTE — Telephone Encounter (Signed)
I think she would be fine as long as she wears a mask, has her clients wear mask and good hand hygiene.  If she wishes she could even have her clients check temperatures before coming to see her.

## 2018-10-08 NOTE — Telephone Encounter (Signed)
Patient advised as directed below. 

## 2019-01-02 ENCOUNTER — Other Ambulatory Visit: Payer: Self-pay

## 2019-01-02 ENCOUNTER — Ambulatory Visit
Admission: RE | Admit: 2019-01-02 | Discharge: 2019-01-02 | Disposition: A | Discharge status: Home / Self Care | Payer: Medicare Other | Source: Ambulatory Visit | Attending: Physician Assistant | Admitting: Physician Assistant

## 2019-01-02 DIAGNOSIS — Z803 Family history of malignant neoplasm of breast: Secondary | ICD-10-CM

## 2019-01-02 DIAGNOSIS — Z1231 Encounter for screening mammogram for malignant neoplasm of breast: Secondary | ICD-10-CM | POA: Diagnosis not present

## 2019-01-02 DIAGNOSIS — Z1239 Encounter for other screening for malignant neoplasm of breast: Secondary | ICD-10-CM

## 2019-01-26 ENCOUNTER — Telehealth: Payer: Self-pay | Admitting: Physician Assistant

## 2019-01-26 NOTE — Telephone Encounter (Signed)
Reconcile vaccine.

## 2019-01-26 NOTE — Telephone Encounter (Signed)
p called to say she received a flu vaccine on Sept 2nd. at [facility].  Con Memos

## 2019-02-09 ENCOUNTER — Other Ambulatory Visit: Payer: Self-pay | Admitting: Physician Assistant

## 2019-02-09 DIAGNOSIS — E7800 Pure hypercholesterolemia, unspecified: Secondary | ICD-10-CM

## 2019-02-09 DIAGNOSIS — I10 Essential (primary) hypertension: Secondary | ICD-10-CM

## 2019-02-19 ENCOUNTER — Other Ambulatory Visit: Payer: Self-pay | Admitting: Physician Assistant

## 2019-02-19 DIAGNOSIS — I10 Essential (primary) hypertension: Secondary | ICD-10-CM

## 2019-06-01 ENCOUNTER — Encounter: Payer: Self-pay | Admitting: Physician Assistant

## 2019-06-08 NOTE — Telephone Encounter (Signed)
Pt called asking when the covid vaccine is available should she get the it.  She states she is allergic to penicillin and Cipro.  She has not scheduled for the vaccine and would like to know what Ms. Burnette's thoughts were.

## 2019-06-10 DIAGNOSIS — M62830 Muscle spasm of back: Secondary | ICD-10-CM | POA: Diagnosis not present

## 2019-06-10 DIAGNOSIS — M9902 Segmental and somatic dysfunction of thoracic region: Secondary | ICD-10-CM | POA: Diagnosis not present

## 2019-06-10 DIAGNOSIS — M5136 Other intervertebral disc degeneration, lumbar region: Secondary | ICD-10-CM | POA: Diagnosis not present

## 2019-06-10 DIAGNOSIS — M9903 Segmental and somatic dysfunction of lumbar region: Secondary | ICD-10-CM | POA: Diagnosis not present

## 2019-06-11 NOTE — Progress Notes (Signed)
[name] Female    DOB: 18-Sep-1940   79 y.o.   MRN: [id number] Visit Date: 06/16/2019  Today's Provider: Mar Daring, PA-C   Chief Complaint  Patient presents with  . Cellulitis  . Dysuria   Subjective:     Dysuria  This is a new problem. The current episode started today. The problem occurs every urination. The quality of the pain is described as burning ("itchy"). There has been no fever. Associated symptoms include urgency. Pertinent negatives include no discharge, flank pain, frequency or hematuria. Associated symptoms comments: Back ache on left side. She has tried nothing for the symptoms.     Cellulitis: Patient c/o of swelling, redness in the last month. Reports that both of her legs have been hot and red. Right one started breaking out and is sore for the past 2 weeks. Reports that she put on Neosporin.   Allergies  Allergen Reactions  . Ciprofloxacin   . Tegaderm Ag Mesh 2"X2"  [Wound Dressings]     when removing the patch her skin came off.  . Latex Rash  . Lisinopril Cough  . Penicillins Rash     Current Outpatient Medications:  .  albuterol (PROVENTIL HFA;VENTOLIN HFA) 108 (90 Base) MCG/ACT inhaler, Inhale 2 puffs into the lungs every 6 (six) hours as needed for wheezing or shortness of breath., Disp: 1 Inhaler, Rfl: 0 .  atorvastatin (LIPITOR) 20 MG tablet, TAKE 1 TABLET BY MOUTH AT  BEDTIME FOR CHOLESTEROL, Disp: 90 tablet, Rfl: 3 .  Biotin (BIOTIN 5000) 5 MG CAPS, Take 5 mg by mouth., Disp: , Rfl:  .  Calcium Carbonate-Vitamin D 600-200 MG-UNIT TABS, Take 2 tablets by mouth daily., Disp: , Rfl:  .  Chromium Picolinate 1000 MCG TABS, Take 1 tablet by mouth daily., Disp: , Rfl:  .  Coenzyme Q10 100 MG TABS, Take 1 capsule by mouth daily., Disp: , Rfl:  .  Cyanocobalamin (B-12) 2500 MCG TABS, Take 2,500 mcg by mouth daily., Disp: , Rfl:  .  fluticasone (FLONASE) 50 MCG/ACT nasal spray, Place 2 sprays into both nostrils daily.,  Disp: 16 g, Rfl: 6 .  Glucos-Chond-Hyal Ac-Ca Fructo (MOVE FREE JOINT HEALTH ADVANCE) TABS, Take by mouth daily., Disp: , Rfl:  .  hydrochlorothiazide (HYDRODIURIL) 12.5 MG tablet, TAKE 1 TABLET BY MOUTH  DAILY, Disp: 90 tablet, Rfl: 3 .  levothyroxine (SYNTHROID) 112 MCG tablet, TAKE 1 TABLET BY MOUTH  DAILY BEFORE BREAKFAST, Disp: 90 tablet, Rfl: 3 .  losartan (COZAAR) 100 MG tablet, TAKE 1 TABLET BY MOUTH  DAILY, Disp: 90 tablet, Rfl: 3 .  Magnesium 250 MG TABS, Take 1 tablet (250 mg total) by mouth daily. (Patient taking differently: Take 400 mg by mouth daily. ), Disp: 30 tablet, Rfl: 0 .  MULTIPLE VITAMIN PO, Take 1 tablet by mouth daily. , Disp: , Rfl:  .  Multiple Vitamins-Minerals (ICAPS AREDS 2 PO), Take by mouth daily., Disp: , Rfl:  .  naproxen sodium (ALEVE) 220 MG tablet, Take 220 mg by mouth daily as needed., Disp: , Rfl:  .  Omega-3 Fatty Acids (FISH OIL EXTRA STRENGTH) 1200 MG CAPS, Take 1 capsule by mouth daily., Disp: , Rfl:  .  vitamin E 400 UNIT capsule, Take 400 Units by mouth daily., Disp: , Rfl:  .  Zinc 50 MG CAPS, Take 50 mg by mouth daily., Disp: , Rfl:  .  acetaminophen (TYLENOL) 650 MG CR tablet, Take 650 mg by  mouth every 8 (eight) hours as needed for pain., Disp: , Rfl:  .  doxycycline (VIBRA-TABS) 100 MG tablet, Take 1 tablet (100 mg total) by mouth 2 (two) times daily., Disp: 14 tablet, Rfl: 0 .  furosemide (LASIX) 20 MG tablet, Take 1 tablet (20 mg total) by mouth daily., Disp: 90 tablet, Rfl: 1 .  potassium chloride SA (KLOR-CON) 20 MEQ tablet, Take 1 tablet (20 mEq total) by mouth daily. With lasix, Disp: 90 tablet, Rfl: 1 .  simethicone (MYLICON) 277 MG chewable tablet, Chew 125 mg by mouth daily., Disp: , Rfl:  .  Tiotropium Bromide Monohydrate (SPIRIVA RESPIMAT) 2.5 MCG/ACT AERS, Inhale 1 puff into the lungs daily. (Patient not taking: Reported on 06/12/2019), Disp: 4 g, Rfl: 5 .  triamcinolone cream (KENALOG) 0.1 %, Apply 1 application topically 2 (two) times  daily. (Patient not taking: Reported on 06/12/2019), Disp: 80 g, Rfl: 1  Review of Systems  Constitutional: Negative.   Respiratory: Negative.   Cardiovascular: Positive for leg swelling.  Gastrointestinal: Negative.   Genitourinary: Positive for dysuria and urgency. Negative for flank pain, frequency and hematuria.  Skin: Positive for color change.  Neurological: Negative.     Social History   Tobacco Use  . Smoking status: Former Smoker    Types: Cigarettes    Quit date: 05/13/1996    Years since quitting: 23.1  . Smokeless tobacco: Never Used  Substance Use Topics  . Alcohol use: Yes    Alcohol/week: 0.0 - 2.0 standard drinks    Comment: occasionally       Objective:   BP 130/83 (BP Location: Left Arm, Patient Position: Sitting, Cuff Size: Large)   Pulse (!) 128   Temp (!) 96.9 F (36.1 C) (Temporal)   Resp 16   Wt 280 lb 9.6 oz (127.3 kg)   SpO2 98%   BMI 46.69 kg/m  Vitals:   06/12/19 0730  BP: 130/83  Pulse: (!) 128  Resp: 16  Temp: (!) 96.9 F (36.1 C)  TempSrc: Temporal  SpO2: 98%  Weight: 280 lb 9.6 oz (127.3 kg)  Body mass index is 46.69 kg/m.   Physical Exam Vitals reviewed.  Constitutional:      General: She is not in acute distress.    Appearance: Normal appearance. She is well-developed. She is not ill-appearing or diaphoretic.  Cardiovascular:     Rate and Rhythm: Normal rate and regular rhythm.     Heart sounds: Normal heart sounds. No murmur. No friction rub. No gallop.   Pulmonary:     Effort: Pulmonary effort is normal. No respiratory distress.     Breath sounds: Normal breath sounds. No wheezing or rales.  Abdominal:     General: Bowel sounds are normal. There is no distension.     Palpations: Abdomen is soft. There is no mass.     Tenderness: There is no abdominal tenderness. There is no right CVA tenderness, left CVA tenderness, guarding or rebound.  Musculoskeletal:     Right lower leg: Edema (1+ pitting) present.     Left  lower leg: Edema (1+ pititng) present.  Skin:    General: Skin is warm and dry.     Findings: Erythema (bilateral lower extremity with R>L) present.  Neurological:     Mental Status: She is alert and oriented to person, place, and time.      Results for orders placed or performed in visit on 06/12/19  Urine Culture   Specimen: Urine   UR  Result  Value Ref Range   Urine Culture, Routine Final report    Organism ID, Bacteria Comment   POCT urinalysis dipstick  Result Value Ref Range   Color, UA light Yellow    Clarity, UA Clear    Glucose, UA Negative Negative   Bilirubin, UA Negative    Ketones, UA Negative    Spec Grav, UA 1.010 1.010 - 1.025   Blood, UA Negative    pH, UA 6.0 5.0 - 8.0   Protein, UA Negative Negative   Urobilinogen, UA 0.2 0.2 or 1.0 E.U./dL   Nitrite, UA Negative    Leukocytes, UA Negative Negative   Appearance     Odor Negative        Assessment & Plan    1. Dysuria UA unremarkable but will send for culture to r/o early infection. - POCT urinalysis dipstick - Urine Culture  2. Cellulitis of right lower extremity Noted on both lower extremities. Discussed fluid control with elevating legs, compression stockings. Doxycycline sent in to treat.      Mar Daring, PA-C  [facility]

## 2019-06-12 ENCOUNTER — Other Ambulatory Visit: Payer: Self-pay

## 2019-06-12 ENCOUNTER — Telehealth: Payer: Self-pay | Admitting: Physician Assistant

## 2019-06-12 ENCOUNTER — Encounter: Payer: Self-pay | Admitting: Physician Assistant

## 2019-06-12 ENCOUNTER — Ambulatory Visit (INDEPENDENT_AMBULATORY_CARE_PROVIDER_SITE_OTHER): Payer: Medicare Other | Admitting: Physician Assistant

## 2019-06-12 VITALS — BP 130/83 | HR 128 | Temp 96.9°F | Resp 16 | Wt 280.6 lb

## 2019-06-12 DIAGNOSIS — R300 Dysuria: Secondary | ICD-10-CM

## 2019-06-12 DIAGNOSIS — L03115 Cellulitis of right lower limb: Secondary | ICD-10-CM

## 2019-06-12 DIAGNOSIS — I5042 Chronic combined systolic (congestive) and diastolic (congestive) heart failure: Secondary | ICD-10-CM

## 2019-06-12 LAB — POCT URINALYSIS DIPSTICK
Bilirubin, UA: NEGATIVE
Blood, UA: NEGATIVE
Glucose, UA: NEGATIVE
Ketones, UA: NEGATIVE
Leukocytes, UA: NEGATIVE
Nitrite, UA: NEGATIVE
Odor: NEGATIVE
Protein, UA: NEGATIVE
Spec Grav, UA: 1.01 (ref 1.010–1.025)
Urobilinogen, UA: 0.2 E.U./dL
pH, UA: 6 (ref 5.0–8.0)

## 2019-06-12 MED ORDER — CEPHALEXIN 500 MG PO CAPS: 500.0000 mg | ORAL_CAPSULE | Freq: Four times a day (QID) | ORAL | 0 refills | Status: DC

## 2019-06-12 MED ORDER — FUROSEMIDE 20 MG PO TABS: 20.0000 mg | ORAL_TABLET | Freq: Every day | ORAL | 1 refills | Status: DC

## 2019-06-12 MED ORDER — POTASSIUM CHLORIDE CRYS ER 20 MEQ PO TBCR: 20.0000 meq | EXTENDED_RELEASE_TABLET | Freq: Every day | ORAL | 1 refills | Status: DC

## 2019-06-12 NOTE — Telephone Encounter (Signed)
Medication Refill - Medication: Potassium chloride 20 MEQ/Furosemide 20MG   Pt stated she was supposed to call in rx request to [facility].   Has the patient contacted their pharmacy? No. (Agent: If no, request that the patient contact the pharmacy for the refill.) (Agent: If yes, when and what did the pharmacy advise?) New rx  Preferred Pharmacy (with phone number or street name):  [place], [place] Phone:  [phone]  Fax:  [phone]       Agent: Please be advised that RX refills may take up to 3 business days. We ask that you follow-up with your pharmacy.

## 2019-06-12 NOTE — Telephone Encounter (Signed)
Refilled for her 

## 2019-06-12 NOTE — Patient Instructions (Signed)

## 2019-06-12 NOTE — Telephone Encounter (Signed)
Pt states that Fenton Malling called in an antibiotic for her.  States that the pharmacy flagged it because it has penicillin in it.  Pt states that she is allergic to penicillin and wants to know if she is supposed to take that medication or not.  Pt wasn't sure which medication it was with the penicillin in it, just that the pharmacy told her to check with her doctor.

## 2019-06-14 LAB — URINE CULTURE

## 2019-06-15 ENCOUNTER — Telehealth: Payer: Self-pay

## 2019-06-15 MED ORDER — DOXYCYCLINE HYCLATE 100 MG PO TABS: 100.0000 mg | ORAL_TABLET | Freq: Two times a day (BID) | ORAL | 0 refills | Status: DC

## 2019-06-15 NOTE — Telephone Encounter (Signed)
Changed to doxycycline

## 2019-06-15 NOTE — Telephone Encounter (Signed)
-----   Message from Mar Daring, [place] sent at 06/15/2019  9:49 AM EST ----- Urine culture is negative for bacterial growth.

## 2019-06-15 NOTE — Telephone Encounter (Signed)
Patient advised as directed. Leg looking better but really looks inflame. Per patient she will send the provider a message if it doesn't get any better.

## 2019-06-17 ENCOUNTER — Encounter: Payer: Self-pay | Admitting: Physician Assistant

## 2019-06-17 DIAGNOSIS — M5136 Other intervertebral disc degeneration, lumbar region: Secondary | ICD-10-CM | POA: Diagnosis not present

## 2019-06-17 DIAGNOSIS — L03115 Cellulitis of right lower limb: Secondary | ICD-10-CM

## 2019-06-17 DIAGNOSIS — M9902 Segmental and somatic dysfunction of thoracic region: Secondary | ICD-10-CM | POA: Diagnosis not present

## 2019-06-17 DIAGNOSIS — M62830 Muscle spasm of back: Secondary | ICD-10-CM | POA: Diagnosis not present

## 2019-06-17 DIAGNOSIS — M9903 Segmental and somatic dysfunction of lumbar region: Secondary | ICD-10-CM | POA: Diagnosis not present

## 2019-06-18 MED ORDER — DOXYCYCLINE HYCLATE 100 MG PO TABS: 100.0000 mg | ORAL_TABLET | Freq: Two times a day (BID) | ORAL | 0 refills | Status: DC

## 2019-06-29 NOTE — Progress Notes (Deleted)
[name] Female    DOB: 1940/12/16   79 y.o.   MRN: [id number] Visit Date: 06/29/2019  Today's Provider: Mar Daring, PA-C   No chief complaint on file.  Subjective:     HPI  Cellulitis of right lower extremity: Noted on both lower extremities. Discussed fluid control with elevating legs, compression stockings. Treated with Doxycycline 2 weeks ago then on 02/03 she sent a message that she finished her antibiotics but her right leg was better but still warm, light red, with a breakout spot the size of a 50 cent piece. Another round of antibiotic was sent in for her.   Allergies  Allergen Reactions  . Ciprofloxacin   . Tegaderm Ag Mesh 2"X2"  [Wound Dressings]     when removing the patch her skin came off.  . Latex Rash  . Lisinopril Cough  . Penicillins Rash     Current Outpatient Medications:  .  acetaminophen (TYLENOL) 650 MG CR tablet, Take 650 mg by mouth every 8 (eight) hours as needed for pain., Disp: , Rfl:  .  albuterol (PROVENTIL HFA;VENTOLIN HFA) 108 (90 Base) MCG/ACT inhaler, Inhale 2 puffs into the lungs every 6 (six) hours as needed for wheezing or shortness of breath., Disp: 1 Inhaler, Rfl: 0 .  atorvastatin (LIPITOR) 20 MG tablet, TAKE 1 TABLET BY MOUTH AT  BEDTIME FOR CHOLESTEROL, Disp: 90 tablet, Rfl: 3 .  Biotin (BIOTIN 5000) 5 MG CAPS, Take 5 mg by mouth., Disp: , Rfl:  .  Calcium Carbonate-Vitamin D 600-200 MG-UNIT TABS, Take 2 tablets by mouth daily., Disp: , Rfl:  .  Chromium Picolinate 1000 MCG TABS, Take 1 tablet by mouth daily., Disp: , Rfl:  .  Coenzyme Q10 100 MG TABS, Take 1 capsule by mouth daily., Disp: , Rfl:  .  Cyanocobalamin (B-12) 2500 MCG TABS, Take 2,500 mcg by mouth daily., Disp: , Rfl:  .  doxycycline (VIBRA-TABS) 100 MG tablet, Take 1 tablet (100 mg total) by mouth 2 (two) times daily., Disp: 14 tablet, Rfl: 0 .  fluticasone (FLONASE) 50 MCG/ACT nasal spray, Place 2 sprays into both nostrils daily., Disp: 16  g, Rfl: 6 .  furosemide (LASIX) 20 MG tablet, Take 1 tablet (20 mg total) by mouth daily., Disp: 90 tablet, Rfl: 1 .  Glucos-Chond-Hyal Ac-Ca Fructo (MOVE FREE JOINT HEALTH ADVANCE) TABS, Take by mouth daily., Disp: , Rfl:  .  hydrochlorothiazide (HYDRODIURIL) 12.5 MG tablet, TAKE 1 TABLET BY MOUTH  DAILY, Disp: 90 tablet, Rfl: 3 .  levothyroxine (SYNTHROID) 112 MCG tablet, TAKE 1 TABLET BY MOUTH  DAILY BEFORE BREAKFAST, Disp: 90 tablet, Rfl: 3 .  losartan (COZAAR) 100 MG tablet, TAKE 1 TABLET BY MOUTH  DAILY, Disp: 90 tablet, Rfl: 3 .  Magnesium 250 MG TABS, Take 1 tablet (250 mg total) by mouth daily. (Patient taking differently: Take 400 mg by mouth daily. ), Disp: 30 tablet, Rfl: 0 .  MULTIPLE VITAMIN PO, Take 1 tablet by mouth daily. , Disp: , Rfl:  .  Multiple Vitamins-Minerals (ICAPS AREDS 2 PO), Take by mouth daily., Disp: , Rfl:  .  naproxen sodium (ALEVE) 220 MG tablet, Take 220 mg by mouth daily as needed., Disp: , Rfl:  .  Omega-3 Fatty Acids (FISH OIL EXTRA STRENGTH) 1200 MG CAPS, Take 1 capsule by mouth daily., Disp: , Rfl:  .  potassium chloride SA (KLOR-CON) 20 MEQ tablet, Take 1 tablet (20 mEq total) by mouth daily. With  lasix, Disp: 90 tablet, Rfl: 1 .  simethicone (MYLICON) 272 MG chewable tablet, Chew 125 mg by mouth daily., Disp: , Rfl:  .  Tiotropium Bromide Monohydrate (SPIRIVA RESPIMAT) 2.5 MCG/ACT AERS, Inhale 1 puff into the lungs daily. (Patient not taking: Reported on 06/12/2019), Disp: 4 g, Rfl: 5 .  triamcinolone cream (KENALOG) 0.1 %, Apply 1 application topically 2 (two) times daily. (Patient not taking: Reported on 06/12/2019), Disp: 80 g, Rfl: 1 .  vitamin E 400 UNIT capsule, Take 400 Units by mouth daily., Disp: , Rfl:  .  Zinc 50 MG CAPS, Take 50 mg by mouth daily., Disp: , Rfl:   Review of Systems  Social History   Tobacco Use  . Smoking status: Former Smoker    Types: Cigarettes    Quit date: 05/13/1996    Years since quitting: 23.1  . Smokeless tobacco:  Never Used  Substance Use Topics  . Alcohol use: Yes    Alcohol/week: 0.0 - 2.0 standard drinks    Comment: occasionally       Objective:   There were no vitals taken for this visit. There were no vitals filed for this visit.There is no height or weight on file to calculate BMI.   Physical Exam   No results found for any visits on 07/02/19.     Assessment & [place], PA-C  [facility]

## 2019-07-01 DIAGNOSIS — M62830 Muscle spasm of back: Secondary | ICD-10-CM | POA: Diagnosis not present

## 2019-07-01 DIAGNOSIS — M5136 Other intervertebral disc degeneration, lumbar region: Secondary | ICD-10-CM | POA: Diagnosis not present

## 2019-07-01 DIAGNOSIS — M9903 Segmental and somatic dysfunction of lumbar region: Secondary | ICD-10-CM | POA: Diagnosis not present

## 2019-07-01 DIAGNOSIS — M9902 Segmental and somatic dysfunction of thoracic region: Secondary | ICD-10-CM | POA: Diagnosis not present

## 2019-07-01 NOTE — Progress Notes (Signed)
Subjective:   Mary Kennedy is a 79 y.o. female who presents for Medicare Annual (Subsequent) preventive examination.    This visit is being conducted through telemedicine due to the COVID-19 pandemic. This patient has given me verbal consent via doximity to conduct this visit, patient states they are participating from their home address. Some vital signs may be absent or patient reported.    Patient identification: identified by name, DOB, and current address  Review of Systems:  N/A  Cardiac Risk Factors include: advanced age (>66men, >65 women);dyslipidemia;obesity (BMI >30kg/m2)     Objective:     Vitals: There were no vitals taken for this visit.  There is no height or weight on file to calculate BMI. Unable to obtain vitals due to visit being conducted via telephonically.   Advanced Directives 07/02/2019 06/30/2018 12/26/2017 08/27/2017 06/24/2017 04/25/2015  Does Patient Have a Medical Advance Directive? Yes No Yes Yes Yes Yes  Type of Paramedic of [place];Living will - Living will - Living will Living will  Copy of [place] in Chart? No - copy requested - - - - -  Would patient like information on creating a medical advance directive? - No - Patient declined No - Patient declined - - -    Tobacco Social History   Tobacco Use  Smoking Status Former Smoker  . Types: Cigarettes  . Quit date: 05/13/1996  . Years since quitting: 23.1  Smokeless Tobacco Never Used     Counseling given: Not Answered   Clinical Intake:  Pre-visit preparation completed: Yes  Pain : 0-10 Pain Score: 2 (Saw a chiropractor yesterday for issue.) Pain Type: Chronic pain Pain Location: Hip Pain Orientation: Left Pain Descriptors / Indicators: Aching Pain Frequency: Intermittent     Nutritional Risks: None Diabetes: No  How often do you need to have someone help you when you read instructions, pamphlets, or other written materials from  your doctor or pharmacy?: 1 - Never  Interpreter Needed?: No  Information entered by :: [facility], LPN  Past Medical History:  Diagnosis Date  . Allergy   . Cellulitis   . Heart murmur   . Hyperlipidemia   . Hypertension   . Hypothyroidism   . Thyroid disease    Past Surgical History:  Procedure Laterality Date  . ABDOMINAL HYSTERECTOMY  1961   BIL Oophorectomy  . APPENDECTOMY  at age 61  . BILATERAL CARPAL TUNNEL RELEASE  1999  . CARDIAC CATHETERIZATION    . COLONOSCOPY WITH PROPOFOL N/A 08/27/2017   Procedure: COLONOSCOPY WITH PROPOFOL;  Surgeon: Lucilla Lame, MD;  Location: [facility];  Service: Endoscopy;  Laterality: N/A;  . FOOT SURGERY Bilateral   . TONSILLECTOMY  at age 77   Family History  Problem Relation Age of Onset  . Pancreatitis Mother   . Drug abuse Brother        PTSD  . Cancer Brother        pancreas lesion  . Colon cancer Brother   . Heart attack Father   . Lung cancer Brother   . Cancer Brother        lung cancer  . Breast cancer Cousin        2nd cousin   Social History   Socioeconomic History  . Marital status: Divorced    Spouse name: Not on file  . Number of children: 2  . Years of education: Not on file  . Highest education level: Some college, no degree  Occupational History  . Occupation: Retired    Comment: working part-time  Tobacco Use  . Smoking status: Former Smoker    Types: Cigarettes    Quit date: 05/13/1996    Years since quitting: 23.1  . Smokeless tobacco: Never Used  Substance and Sexual Activity  . Alcohol use: Yes    Alcohol/week: 0.0 - 2.0 standard drinks    Comment: occasionally   . Drug use: No  . Sexual activity: Not on file  Other Topics Concern  . Not on file  Social History Narrative  . Not on file   Social Determinants of Health   Financial Resource Strain: Low Risk   . Difficulty of Paying Living Expenses: Not hard at all  Food Insecurity: No Food Insecurity  . Worried About Sales executive in the Last Year: Never true  . Ran Out of Food in the Last Year: Never true  Transportation Needs: No Transportation Needs  . Lack of Transportation (Medical): No  . Lack of Transportation (Non-Medical): No  Physical Activity: Inactive  . Days of Exercise per Week: 0 days  . Minutes of Exercise per Session: 0 min  Stress: Stress Concern Present  . Feeling of Stress : To some extent  Social Connections: Somewhat Isolated  . Frequency of Communication with Friends and Family: More than three times a week  . Frequency of Social Gatherings with Friends and Family: Once a week  . Attends Religious Services: More than 4 times per year  . Active Member of Clubs or Organizations: No  . Attends Archivist Meetings: Never  . Marital Status: Divorced    Outpatient Encounter Medications as of 07/02/2019  Medication Sig  . acetaminophen (TYLENOL) 650 MG CR tablet Take 650 mg by mouth every 8 (eight) hours as needed for pain.  Marland Kitchen aspirin EC 81 MG tablet Take 81 mg by mouth. Every 4 days  . atorvastatin (LIPITOR) 20 MG tablet TAKE 1 TABLET BY MOUTH AT  BEDTIME FOR CHOLESTEROL  . Biotin (BIOTIN 5000) 5 MG CAPS Take 10 mg by mouth.   . Calcium Carbonate-Vitamin D 600-200 MG-UNIT TABS Take 2 tablets by mouth daily.  . Cholecalciferol (VITAMIN D3) 50 MCG (2000 UT) capsule Take 2,000 Units by mouth daily.  . Chromium Picolinate 1000 MCG TABS Take 500 mcg by mouth daily.   . Coenzyme Q10 100 MG TABS Take 1 capsule by mouth daily.  . Echinacea 450 MG CAPS Take 1 capsule by mouth daily.  . fluticasone (FLONASE) 50 MCG/ACT nasal spray Place 2 sprays into both nostrils daily. (Patient taking differently: Place 2 sprays into both nostrils daily. As needed)  . furosemide (LASIX) 20 MG tablet Take 1 tablet (20 mg total) by mouth daily.  . Glucos-Chond-Hyal Ac-Ca Fructo (MOVE FREE JOINT HEALTH ADVANCE) TABS Take 2 tablets by mouth daily.   . hydrochlorothiazide (HYDRODIURIL) 12.5 MG tablet TAKE 1  TABLET BY MOUTH  DAILY  . Krill Oil 350 MG CAPS Take 1 tablet by mouth daily.  Marland Kitchen levothyroxine (SYNTHROID) 112 MCG tablet TAKE 1 TABLET BY MOUTH  DAILY BEFORE BREAKFAST  . losartan (COZAAR) 100 MG tablet TAKE 1 TABLET BY MOUTH  DAILY  . Magnesium 250 MG TABS Take 1 tablet (250 mg total) by mouth daily. (Patient taking differently: Take 25 mg by mouth daily. )  . MULTIPLE VITAMIN PO Take 1 tablet by mouth daily.   . Multiple Vitamins-Minerals (ICAPS AREDS 2 PO) Take by mouth daily.  . naproxen sodium (  ALEVE) 220 MG tablet Take 220 mg by mouth daily as needed.  . potassium chloride SA (KLOR-CON) 20 MEQ tablet Take 1 tablet (20 mEq total) by mouth daily. With lasix  . SUPER B COMPLEX/C PO Take 1 tablet by mouth. Twice a week  . triamcinolone cream (KENALOG) 0.1 % Apply 1 application topically 2 (two) times daily. (Patient taking differently: Apply 1 application topically 2 (two) times daily. As needed)  . Zinc 50 MG CAPS Take 50 mg by mouth daily.  Marland Kitchen albuterol (PROVENTIL HFA;VENTOLIN HFA) 108 (90 Base) MCG/ACT inhaler Inhale 2 puffs into the lungs every 6 (six) hours as needed for wheezing or shortness of breath. (Patient not taking: Reported on 07/02/2019)  . Cyanocobalamin (B-12) 2500 MCG TABS Take 2,500 mcg by mouth daily.  Marland Kitchen doxycycline (VIBRA-TABS) 100 MG tablet Take 1 tablet (100 mg total) by mouth 2 (two) times daily. (Patient not taking: Reported on 07/02/2019)  . Omega-3 Fatty Acids (FISH OIL EXTRA STRENGTH) 1200 MG CAPS Take 1 capsule by mouth daily.  . simethicone (MYLICON) 741 MG chewable tablet Chew 125 mg by mouth daily.  . Tiotropium Bromide Monohydrate (SPIRIVA RESPIMAT) 2.5 MCG/ACT AERS Inhale 1 puff into the lungs daily. (Patient not taking: Reported on 06/12/2019)  . vitamin E 400 UNIT capsule Take 400 Units by mouth daily.   No facility-administered encounter medications on file as of 07/02/2019.    Activities of Daily Living In your present state of health, do you have any  difficulty performing the following activities: 07/02/2019  Hearing? N  Vision? N  Difficulty concentrating or making decisions? N  Walking or climbing stairs? Y  Comment Due to SOB.  Dressing or bathing? N  Doing errands, shopping? N  Preparing Food and eating ? N  Using the Toilet? N  In the past six months, have you accidently leaked urine? Y  Comment Due to taking a diuretic.  Do you have problems with loss of bowel control? N  Managing your Medications? N  Managing your Finances? N  Housekeeping or managing your Housekeeping? N  Some recent data might be hidden    Patient Care Team: Mar Daring, PA-C as PCP - General (Family Medicine) Birder Robson, MD as Referring Physician (Ophthalmology) Yolonda Kida, MD as Consulting Physician (Cardiology) Iris Pert, [place] as Referring Physician (Chiropractic Medicine)    Assessment:   This is a routine wellness examination for [facility].  Exercise Activities and Dietary recommendations Current Exercise Habits: The patient does not participate in regular exercise at present, Exercise limited by: orthopedic condition(s);respiratory conditions(s)  Goals    . Exercise 3x per week (30 min per time)     Recommend some form of exercise 3 days a week for at least 30 minutes. Pt to start swimming and doing eccentrics for 3-4 days a week.   06/30/18: Continue trying to exercise 3 days a week for at least 30 minutes at a time. Pt to start riding her recumbent bike 3 times a week for at least 30 minutes at a time.        Fall Risk: Fall Risk  07/02/2019 06/12/2019 06/30/2018 06/24/2017 06/26/2016  Falls in the past year? 0 0 0 No No  Number falls in past yr: 0 0 - - -  Injury with Fall? 0 0 - - -  Follow up - Falls evaluation completed - - -    FALL RISK PREVENTION PERTAINING TO THE HOME:  Any stairs in or around the home? Yes  If so, are there any without handrails? No   Home free of loose throw rugs in walkways, pet  beds, electrical cords, etc? Yes  Adequate lighting in your home to reduce risk of falls? Yes   ASSISTIVE DEVICES UTILIZED TO PREVENT FALLS:  Life alert? No  Use of a cane, walker or w/c? No  Grab bars in the bathroom? No  Shower chair or bench in shower? No  Elevated toilet seat or a handicapped toilet? Yes    TIMED UP AND GO:  Was the test performed? No .    Depression Screen PHQ 2/9 Scores 09/22/2018 06/30/2018 06/24/2017 06/26/2016  PHQ - 2 Score 0 2 0 0  PHQ- 9 Score 6 10 - -     Cognitive Function: Declined today.     6CIT Screen 06/30/2018 06/24/2017  What Year? 0 points 0 points  What month? 0 points 0 points  What time? 0 points 0 points  Count back from 20 0 points 0 points  Months in reverse 2 points 0 points  Repeat phrase 0 points 0 points  Total Score 2 0    Immunization History  Administered Date(s) Administered  . Influenza Split 02/19/2007, 02/27/2011  . Influenza, High Dose Seasonal PF 02/09/2014, 03/15/2016, 01/14/2019  . Influenza-Unspecified 03/04/2015, 03/05/2017, 02/10/2018  . Pneumococcal Conjugate-13 03/22/2014  . Pneumococcal Polysaccharide-23 11/03/2012  . Tdap 06/20/2010  . Zoster 06/20/2010    Qualifies for Shingles Vaccine? Yes  Zostavax completed 06/20/10. Due for Shingrix. Pt has been advised to call insurance company to determine out of pocket expense. Advised may also receive vaccine at local pharmacy or Health Dept. Verbalized acceptance and understanding.  Tdap: Up to date  Flu Vaccine: Up to date  Pneumococcal Vaccine: Completed series  Screening Tests Health Maintenance  Topic Date Due  . DEXA SCAN  06/20/2020  . TETANUS/TDAP  06/20/2020  . INFLUENZA VACCINE  Completed  . PNA vac Low Risk Adult  Completed    Cancer Screenings:  Colorectal Screening: No longer required.   Mammogram: No longer required.   Bone Density: Completed 06/20/10. Previous DEXA scan was normal. No repeat needed unless advised by a physician.   Lung Cancer Screening: (Low Dose CT Chest recommended if Age 62-80 years, 30 pack-year currently smoking OR have quit w/in 15years.) does not qualify.   Additional Screening:  Vision Screening: Recommended annual ophthalmology exams for early detection of glaucoma and other disorders of the eye.  Dental Screening: Recommended annual dental exams for proper oral hygiene  Community Resource Referral:  CRR required this visit?  No       Plan:  I have personally reviewed and addressed the Medicare Annual Wellness questionnaire and have noted the following in the patient's chart:  A. Medical and social history B. Use of alcohol, tobacco or illicit drugs  C. Current medications and supplements D. Functional ability and status E.  Nutritional status F.  Physical activity G. Advance directives H. List of other physicians I.  Hospitalizations, surgeries, and ER visits in previous 12 months J.  [place] such as hearing and vision if needed, cognitive and depression L. Referrals and appointments   In addition, I have reviewed and discussed with patient certain preventive protocols, quality metrics, and best practice recommendations. A written personalized care plan for preventive services as well as general preventive health recommendations were provided to patient. Nurse Health Advisor  Signed,    Matai Carpenito [place], [place]  [id number] Nurse Health Advisor   Nurse Notes: None.

## 2019-07-02 ENCOUNTER — Ambulatory Visit: Payer: Medicare Other | Admitting: Physician Assistant

## 2019-07-02 ENCOUNTER — Ambulatory Visit (INDEPENDENT_AMBULATORY_CARE_PROVIDER_SITE_OTHER): Payer: Medicare Other

## 2019-07-02 ENCOUNTER — Other Ambulatory Visit: Payer: Self-pay

## 2019-07-02 DIAGNOSIS — Z0000 Encounter for general adult medical examination without abnormal findings: Secondary | ICD-10-CM

## 2019-07-02 NOTE — Patient Instructions (Signed)
Mary Kennedy , Thank you for taking time to come for your Medicare Wellness Visit. I appreciate your ongoing commitment to your health goals. Please review the following plan we discussed and let me know if I can assist you in the future.   Screening recommendations/referrals: Colonoscopy: No longer required.  Mammogram: No longer required.  Bone Density: Up to date, previous DEXA scan was normal. No repeat needed unless advised by a physician. Recommended yearly ophthalmology/optometry visit for glaucoma screening and checkup Recommended yearly dental visit for hygiene and checkup  Vaccinations: Influenza vaccine: Up to date Pneumococcal vaccine: Completed series Tdap vaccine: Up to date, due 06/2020 Shingles vaccine: Pt declines today.     Advanced directives: Please bring a copy of your POA (Power of Attorney) and/or Living Will to your next appointment.   Conditions/risks identified: Recommend to start exercising 3 days a week for 30 minutes at a time.   Next appointment: 07/06/19 @ 7:00 AM with Fenton Malling. Declined scheduling an AWV for 2022 at this time.    Preventive Care 78 Years and Older, Female Preventive care refers to lifestyle choices and visits with your health care provider that can promote health and wellness. What does preventive care include?  A yearly physical exam. This is also called an annual well check.  Dental exams once or twice a year.  Routine eye exams. Ask your health care provider how often you should have your eyes checked.  Personal lifestyle choices, including:  Daily care of your teeth and gums.  Regular physical activity.  Eating a healthy diet.  Avoiding tobacco and drug use.  Limiting alcohol use.  Practicing safe sex.  Taking low-dose aspirin every day.  Taking vitamin and mineral supplements as recommended by your health care provider. What happens during an annual well check? The services and screenings done by your health  care provider during your annual well check will depend on your age, overall health, lifestyle risk factors, and family history of disease. Counseling  Your health care provider may ask you questions about your:  Alcohol use.  Tobacco use.  Drug use.  Emotional well-being.  Home and relationship well-being.  Sexual activity.  Eating habits.  History of falls.  Memory and ability to understand (cognition).  Work and work Statistician.  Reproductive health. Screening  You may have the following tests or measurements:  Height, weight, and BMI.  Blood pressure.  Lipid and cholesterol levels. These may be checked every 5 years, or more frequently if you are over 75 years old.  Skin check.  Lung cancer screening. You may have this screening every year starting at age 19 if you have a 30-pack-year history of smoking and currently smoke or have quit within the past 15 years.  Fecal occult blood test (FOBT) of the stool. You may have this test every year starting at age 69.  Flexible sigmoidoscopy or colonoscopy. You may have a sigmoidoscopy every 5 years or a colonoscopy every 10 years starting at age 48.  Hepatitis C blood test.  Hepatitis B blood test.  Sexually transmitted disease (STD) testing.  Diabetes screening. This is done by checking your blood sugar (glucose) after you have not eaten for a while (fasting). You may have this done every 1-3 years.  Bone density scan. This is done to screen for osteoporosis. You may have this done starting at age 107.  Mammogram. This may be done every 1-2 years. Talk to your health care provider about how often you should  have regular mammograms. Talk with your health care provider about your test results, treatment options, and if necessary, the need for more tests. Vaccines  Your health care provider may recommend certain vaccines, such as:  Influenza vaccine. This is recommended every year.  Tetanus, diphtheria, and  acellular pertussis (Tdap, Td) vaccine. You may need a Td booster every 10 years.  Zoster vaccine. You may need this after age 77.  Pneumococcal 13-valent conjugate (PCV13) vaccine. One dose is recommended after age 25.  Pneumococcal polysaccharide (PPSV23) vaccine. One dose is recommended after age 42. Talk to your health care provider about which screenings and vaccines you need and how often you need them. This information is not intended to replace advice given to you by your health care provider. Make sure you discuss any questions you have with your health care provider. Document Released: 05/27/2015 Document Revised: 01/18/2016 Document Reviewed: 03/01/2015 Elsevier Interactive Patient Education  2017 [employer] Prevention in the Home Falls can cause injuries. They can happen to people of all ages. There are many things you can do to make your home safe and to help prevent falls. What can I do on the outside of my home?  Regularly fix the edges of walkways and driveways and fix any cracks.  Remove anything that might make you trip as you walk through a door, such as a raised step or threshold.  Trim any bushes or trees on the path to your home.  Use bright outdoor lighting.  Clear any walking paths of anything that might make someone trip, such as rocks or tools.  Regularly check to see if handrails are loose or broken. Make sure that both sides of any steps have handrails.  Any raised decks and porches should have guardrails on the edges.  Have any leaves, snow, or ice cleared regularly.  Use sand or salt on walking paths during winter.  Clean up any spills in your garage right away. This includes oil or grease spills. What can I do in the bathroom?  Use night lights.  Install grab bars by the toilet and in the tub and shower. Do not use towel bars as grab bars.  Use non-skid mats or decals in the tub or shower.  If you need to sit down in the shower, use  a plastic, non-slip stool.  Keep the floor dry. Clean up any water that spills on the floor as soon as it happens.  Remove soap buildup in the tub or shower regularly.  Attach bath mats securely with double-sided non-slip rug tape.  Do not have throw rugs and other things on the floor that can make you trip. What can I do in the bedroom?  Use night lights.  Make sure that you have a light by your bed that is easy to reach.  Do not use any sheets or blankets that are too big for your bed. They should not hang down onto the floor.  Have a firm chair that has side arms. You can use this for support while you get dressed.  Do not have throw rugs and other things on the floor that can make you trip. What can I do in the kitchen?  Clean up any spills right away.  Avoid walking on wet floors.  Keep items that you use a lot in easy-to-reach places.  If you need to reach something above you, use a strong step stool that has a grab bar.  Keep electrical cords out of  the way.  Do not use floor polish or wax that makes floors slippery. If you must use wax, use non-skid floor wax.  Do not have throw rugs and other things on the floor that can make you trip. What can I do with my stairs?  Do not leave any items on the stairs.  Make sure that there are handrails on both sides of the stairs and use them. Fix handrails that are broken or loose. Make sure that handrails are as long as the stairways.  Check any carpeting to make sure that it is firmly attached to the stairs. Fix any carpet that is loose or worn.  Avoid having throw rugs at the top or bottom of the stairs. If you do have throw rugs, attach them to the floor with carpet tape.  Make sure that you have a light switch at the top of the stairs and the bottom of the stairs. If you do not have them, ask someone to add them for you. What else can I do to help prevent falls?  Wear shoes that:  Do not have high heels.  Have  rubber bottoms.  Are comfortable and fit you well.  Are closed at the toe. Do not wear sandals.  If you use a stepladder:  Make sure that it is fully opened. Do not climb a closed stepladder.  Make sure that both sides of the stepladder are locked into place.  Ask someone to hold it for you, if possible.  Clearly mark and make sure that you can see:  Any grab bars or handrails.  First and last steps.  Where the edge of each step is.  Use tools that help you move around (mobility aids) if they are needed. These include:  Canes.  Walkers.  Scooters.  Crutches.  Turn on the lights when you go into a dark area. Replace any light bulbs as soon as they burn out.  Set up your furniture so you have a clear path. Avoid moving your furniture around.  If any of your floors are uneven, fix them.  If there are any pets around you, be aware of where they are.  Review your medicines with your doctor. Some medicines can make you feel dizzy. This can increase your chance of falling. Ask your doctor what other things that you can do to help prevent falls. This information is not intended to replace advice given to you by your health care provider. Make sure you discuss any questions you have with your health care provider. Document Released: 02/24/2009 Document Revised: 10/06/2015 Document Reviewed: 06/04/2014 Elsevier Interactive Patient Education  2017 [employer].

## 2019-07-03 ENCOUNTER — Ambulatory Visit: Payer: Medicare Other

## 2019-07-06 ENCOUNTER — Ambulatory Visit (INDEPENDENT_AMBULATORY_CARE_PROVIDER_SITE_OTHER): Payer: Medicare Other | Admitting: Physician Assistant

## 2019-07-06 ENCOUNTER — Encounter: Payer: Self-pay | Admitting: Physician Assistant

## 2019-07-06 ENCOUNTER — Other Ambulatory Visit: Payer: Self-pay

## 2019-07-06 VITALS — BP 164/79 | HR 81 | Temp 96.5°F | Wt 274.0 lb

## 2019-07-06 DIAGNOSIS — I872 Venous insufficiency (chronic) (peripheral): Secondary | ICD-10-CM | POA: Diagnosis not present

## 2019-07-06 DIAGNOSIS — E66813 Obesity, class 3: Secondary | ICD-10-CM

## 2019-07-06 NOTE — Patient Instructions (Signed)
[place] compression stockings, can use 15-60mmHg (can be found on [employer])   Chronic Venous Insufficiency Chronic venous insufficiency is a condition where the leg veins cannot effectively pump blood from the legs to the heart. This happens when the vein walls are either stretched, weakened, or damaged, or when the valves inside the vein are damaged. With the right treatment, you should be able to continue with an active life. This condition is also called venous stasis. What are the causes? Common causes of this condition include:  High blood pressure inside the veins (venous hypertension).  Sitting or standing too long, causing increased blood pressure in the leg veins.  A blood clot that blocks blood flow in a vein (deep vein thrombosis, DVT).  Inflammation of a vein (phlebitis) that causes a blood clot to form.  Tumors in the pelvis that cause blood to back up. What increases the risk? The following factors may make you more likely to develop this condition:  Having a family history of this condition.  Obesity.  Pregnancy.  Living without enough regular physical activity or exercise (sedentary lifestyle).  Smoking.  Having a job that requires long periods of standing or sitting in one place.  Being a certain age. Women in their 63s and 51s and men in their 46s are more likely to develop this condition. What are the signs or symptoms? Symptoms of this condition include:  Veins that are enlarged, bulging, or twisted (varicose veins).  Skin breakdown or ulcers.  Reddened skin or dark discoloration of skin on the leg between the knee and ankle.  Brown, smooth, tight, and painful skin just above the ankle, usually on the inside of the leg (lipodermatosclerosis).  Swelling of the legs. How is this diagnosed? This condition may be diagnosed based on:  Your medical history.  A physical exam.  Tests, such as: ? A procedure that creates an image of a blood vessel and  nearby organs and provides information about blood flow through the blood vessel (duplex ultrasound). ? A procedure that tests blood flow (plethysmography). ? A procedure that looks at the veins using X-ray and dye (venogram). How is this treated? The goals of treatment are to help you return to an active life and to minimize pain or disability. Treatment depends on the severity of your condition, and it may include:  Wearing compression stockings. These can help relieve symptoms and help prevent your condition from getting worse. However, they do not cure the condition.  Sclerotherapy. This procedure involves an injection of a solution that shrinks damaged veins.  Surgery. This may involve: ? Removing a diseased vein (vein stripping). ? Cutting off blood flow through the vein (laser ablation surgery). ? Repairing or reconstructing a valve within the affected vein. Follow these instructions at home:      Wear compression stockings as told by your health care provider. These stockings help to prevent blood clots and reduce swelling in your legs.  Take over-the-counter and prescription medicines only as told by your health care provider.  Stay active by exercising, walking, or doing different activities. Ask your health care provider what activities are safe for you and how much exercise you need.  Drink enough fluid to keep your urine pale yellow.  Do not use any products that contain nicotine or tobacco, such as cigarettes, e-cigarettes, and chewing tobacco. If you need help quitting, ask your health care provider.  Keep all follow-up visits as told by your health care provider. This is important. Contact  a health care provider if you:  Have redness, swelling, or more pain in the affected area.  See a red streak or line that goes up or down from the affected area.  Have skin breakdown or skin loss in the affected area, even if the breakdown is small.  Get an injury in the  affected area. Get help right away if:  You get an injury and an open wound in the affected area.  You have: ? Severe pain that does not get better with medicine. ? Sudden numbness or weakness in the foot or ankle below the affected area. ? Trouble moving your foot or ankle. ? A fever. ? Worse or persistent symptoms. ? Chest pain. ? Shortness of breath. Summary  Chronic venous insufficiency is a condition where the leg veins cannot effectively pump blood from the legs to the heart.  Chronic venous insufficiency occurs when the vein walls become stretched, weakened, or damaged, or when valves within the vein are damaged.  Treatment depends on how severe your condition is. It often involves wearing compression stockings and may involve having a procedure.  Make sure you stay active by exercising, walking, or doing different activities. Ask your health care provider what activities are safe for you and how much exercise you need. This information is not intended to replace advice given to you by your health care provider. Make sure you discuss any questions you have with your health care provider. Document Revised: 01/21/2018 Document Reviewed: 01/21/2018 Elsevier Patient Education  [place].

## 2019-07-06 NOTE — Progress Notes (Signed)
[name] Female    DOB: Mar 10, 1941   79 y.o.   MRN: [id number] Visit Date: 07/06/2019  Today's Provider: Mar Daring, PA-C   Chief Complaint  Patient presents with  . Cellulitis   Subjective:     HPI    Follow up for Cellulitis  The patient was last seen for this 3 weeks ago. Changes made at last visit include started on Doxycycline, furosemide and potassium.  She reports excellent compliance with treatment. She feels that condition is Improved. But not back to baseline. She is not having side effects.   Has been elevating her legs on a foot stool. Not wearing compression stockings. Compliant with medications. Seeing Dr. Clayborn Bigness in the coming week for her yearly follow up.  ------------------------------------------------------------------------------------    Allergies  Allergen Reactions  . Ciprofloxacin   . Tegaderm Ag Mesh 2"X2"  [Wound Dressings]     when removing the patch her skin came off.  . Latex Rash  . Lisinopril Cough  . Penicillins Rash     Current Outpatient Medications:  .  acetaminophen (TYLENOL) 650 MG CR tablet, Take 650 mg by mouth every 8 (eight) hours as needed for pain., Disp: , Rfl:  .  aspirin EC 81 MG tablet, Take 81 mg by mouth. Every 4 days, Disp: , Rfl:  .  atorvastatin (LIPITOR) 20 MG tablet, TAKE 1 TABLET BY MOUTH AT  BEDTIME FOR CHOLESTEROL, Disp: 90 tablet, Rfl: 3 .  Biotin (BIOTIN 5000) 5 MG CAPS, Take 10 mg by mouth. , Disp: , Rfl:  .  Calcium Carbonate-Vitamin D 600-200 MG-UNIT TABS, Take 2 tablets by mouth daily., Disp: , Rfl:  .  Cholecalciferol (VITAMIN D3) 50 MCG (2000 UT) capsule, Take 2,000 Units by mouth daily., Disp: , Rfl:  .  Chromium Picolinate 1000 MCG TABS, Take 500 mcg by mouth daily. , Disp: , Rfl:  .  Coenzyme Q10 100 MG TABS, Take 1 capsule by mouth daily., Disp: , Rfl:  .  Cyanocobalamin (B-12) 2500 MCG TABS, Take 2,500 mcg by mouth daily., Disp: , Rfl:  .  Echinacea 450 MG  CAPS, Take 1 capsule by mouth daily., Disp: , Rfl:  .  fluticasone (FLONASE) 50 MCG/ACT nasal spray, Place 2 sprays into both nostrils daily. (Patient taking differently: Place 2 sprays into both nostrils daily. As needed), Disp: 16 g, Rfl: 6 .  furosemide (LASIX) 20 MG tablet, Take 1 tablet (20 mg total) by mouth daily., Disp: 90 tablet, Rfl: 1 .  Glucos-Chond-Hyal Ac-Ca Fructo (MOVE FREE JOINT HEALTH ADVANCE) TABS, Take 2 tablets by mouth daily. , Disp: , Rfl:  .  hydrochlorothiazide (HYDRODIURIL) 12.5 MG tablet, TAKE 1 TABLET BY MOUTH  DAILY, Disp: 90 tablet, Rfl: 3 .  Krill Oil 350 MG CAPS, Take 1 tablet by mouth daily., Disp: , Rfl:  .  levothyroxine (SYNTHROID) 112 MCG tablet, TAKE 1 TABLET BY MOUTH  DAILY BEFORE BREAKFAST, Disp: 90 tablet, Rfl: 3 .  losartan (COZAAR) 100 MG tablet, TAKE 1 TABLET BY MOUTH  DAILY, Disp: 90 tablet, Rfl: 3 .  Magnesium 250 MG TABS, Take 1 tablet (250 mg total) by mouth daily. (Patient taking differently: Take 25 mg by mouth daily. ), Disp: 30 tablet, Rfl: 0 .  MULTIPLE VITAMIN PO, Take 1 tablet by mouth daily. , Disp: , Rfl:  .  Multiple Vitamins-Minerals (ICAPS AREDS 2 PO), Take by mouth daily., Disp: , Rfl:  .  naproxen sodium (ALEVE) 220  MG tablet, Take 220 mg by mouth daily as needed., Disp: , Rfl:  .  Omega-3 Fatty Acids (FISH OIL EXTRA STRENGTH) 1200 MG CAPS, Take 1 capsule by mouth daily., Disp: , Rfl:  .  potassium chloride SA (KLOR-CON) 20 MEQ tablet, Take 1 tablet (20 mEq total) by mouth daily. With lasix, Disp: 90 tablet, Rfl: 1 .  simethicone (MYLICON) 845 MG chewable tablet, Chew 125 mg by mouth daily., Disp: , Rfl:  .  SUPER B COMPLEX/C PO, Take 1 tablet by mouth. Twice a week, Disp: , Rfl:  .  triamcinolone cream (KENALOG) 0.1 %, Apply 1 application topically 2 (two) times daily. (Patient taking differently: Apply 1 application topically 2 (two) times daily. As needed), Disp: 80 g, Rfl: 1 .  vitamin E 400 UNIT capsule, Take 400 Units by mouth  daily., Disp: , Rfl:  .  Zinc 50 MG CAPS, Take 50 mg by mouth daily., Disp: , Rfl:  .  albuterol (PROVENTIL HFA;VENTOLIN HFA) 108 (90 Base) MCG/ACT inhaler, Inhale 2 puffs into the lungs every 6 (six) hours as needed for wheezing or shortness of breath. (Patient not taking: Reported on 07/02/2019), Disp: 1 Inhaler, Rfl: 0 .  doxycycline (VIBRA-TABS) 100 MG tablet, Take 1 tablet (100 mg total) by mouth 2 (two) times daily. (Patient not taking: Reported on 07/02/2019), Disp: 14 tablet, Rfl: 0 .  Tiotropium Bromide Monohydrate (SPIRIVA RESPIMAT) 2.5 MCG/ACT AERS, Inhale 1 puff into the lungs daily. (Patient not taking: Reported on 06/12/2019), Disp: 4 g, Rfl: 5  Review of Systems  Constitutional: Negative.   Respiratory: Negative.   Cardiovascular: Positive for leg swelling.  Musculoskeletal: Positive for arthralgias.  Skin: Positive for color change.  Neurological: Negative for weakness and numbness.    Social History   Tobacco Use  . Smoking status: Former Smoker    Types: Cigarettes    Quit date: 05/13/1996    Years since quitting: 23.1  . Smokeless tobacco: Never Used  Substance Use Topics  . Alcohol use: Yes    Alcohol/week: 0.0 - 2.0 standard drinks    Comment: occasionally       Objective:   BP (!) 164/79 (BP Location: Left Arm, Patient Position: Sitting, Cuff Size: Large)   Pulse 81   Temp (!) 96.5 F (35.8 C) (Temporal)   Wt 274 lb (124.3 kg)   BMI 45.60 kg/m  Vitals:   07/06/19 0721  BP: (!) 164/79  Pulse: 81  Temp: (!) 96.5 F (35.8 C)  TempSrc: Temporal  Weight: 274 lb (124.3 kg)  Body mass index is 45.6 kg/m.   Physical Exam Vitals reviewed.  Constitutional:      General: She is not in acute distress.    Appearance: Normal appearance. She is well-developed. She is obese. She is not ill-appearing.  HENT:     Head: Normocephalic and atraumatic.  Pulmonary:     Effort: Pulmonary effort is normal. No respiratory distress.  Musculoskeletal:     Cervical  back: Normal range of motion and neck supple.     Right lower leg: Edema (1+ pitting edema to tibial plateau) present.     Left lower leg: Edema (trace to 1+ pitting edema) present.  Skin:    General: Skin is warm.     Comments: Mild reddening of the RLE from ankle to mid shin  Neurological:     Mental Status: She is alert.  Psychiatric:        Mood and Affect: Mood normal.  Behavior: Behavior normal.        Thought Content: Thought content normal.        Judgment: Judgment normal.      No results found for any visits on 07/06/19.     Assessment & Plan    1. Venous insufficiency Finally calmed down the cellulitis that had been present. Continue Furosemide and potassium. Discussed continuing conservative therapy with elevating legs higher, wearing compression stockings and continuing to add in daily exercise. Call if worsening again.   2. Obesity, Class III, BMI 40-49.9 (morbid obesity) ([place]) Starting to exercise again. Has been trying to do 30 minutes of light activities.      Mar Daring, PA-C  [facility]

## 2019-07-08 DIAGNOSIS — R001 Bradycardia, unspecified: Secondary | ICD-10-CM | POA: Diagnosis not present

## 2019-07-08 DIAGNOSIS — R0602 Shortness of breath: Secondary | ICD-10-CM | POA: Diagnosis not present

## 2019-07-08 DIAGNOSIS — E785 Hyperlipidemia, unspecified: Secondary | ICD-10-CM | POA: Diagnosis not present

## 2019-07-08 DIAGNOSIS — R011 Cardiac murmur, unspecified: Secondary | ICD-10-CM | POA: Diagnosis not present

## 2019-07-08 DIAGNOSIS — I340 Nonrheumatic mitral (valve) insufficiency: Secondary | ICD-10-CM | POA: Diagnosis not present

## 2019-07-08 DIAGNOSIS — H2513 Age-related nuclear cataract, bilateral: Secondary | ICD-10-CM | POA: Diagnosis not present

## 2019-07-10 ENCOUNTER — Telehealth: Payer: Self-pay

## 2019-07-10 NOTE — Telephone Encounter (Signed)
Copied from [place] [id number]. Topic: General - Other >> Jul 10, 2019  2:28 PM Celene Kras wrote: Reason for CRM: Pt called and is requesting to know which vein doctor was recommended for her. Pt states she has spoken with PCP about this before. Please advise.

## 2019-07-10 NOTE — Telephone Encounter (Signed)
I normally use [facility] with Dr. Lucky Cowboy and Dr. Delana Meyer.

## 2019-07-10 NOTE — Telephone Encounter (Signed)
Patient advised as below.  

## 2019-07-13 ENCOUNTER — Telehealth: Payer: Self-pay

## 2019-07-13 DIAGNOSIS — I872 Venous insufficiency (chronic) (peripheral): Secondary | ICD-10-CM

## 2019-07-13 DIAGNOSIS — I890 Lymphedema, not elsewhere classified: Secondary | ICD-10-CM

## 2019-07-13 DIAGNOSIS — M7989 Other specified soft tissue disorders: Secondary | ICD-10-CM

## 2019-07-13 DIAGNOSIS — M79669 Pain in unspecified lower leg: Secondary | ICD-10-CM

## 2019-07-13 NOTE — Telephone Encounter (Signed)
Copied from [place] [phone]. Topic: Referral - Request for Referral >> Jul 13, 2019 10:24 AM Richardo Priest, NT wrote: Has patient seen PCP for this complaint? yes *If NO, is insurance requiring patient see PCP for this issue before PCP can refer them? Referral for which specialty: vein specialist Preferred provider/office: [facility] Reason for referral: pain increasing

## 2019-07-13 NOTE — Telephone Encounter (Signed)
Referral placed.

## 2019-07-21 ENCOUNTER — Ambulatory Visit (INDEPENDENT_AMBULATORY_CARE_PROVIDER_SITE_OTHER): Payer: Medicare Other | Admitting: Vascular Surgery

## 2019-07-21 ENCOUNTER — Other Ambulatory Visit: Payer: Self-pay

## 2019-07-21 ENCOUNTER — Encounter (INDEPENDENT_AMBULATORY_CARE_PROVIDER_SITE_OTHER): Payer: Self-pay | Admitting: Vascular Surgery

## 2019-07-21 VITALS — BP 146/80 | HR 128 | Resp 20 | Ht 66.0 in | Wt 276.0 lb

## 2019-07-21 DIAGNOSIS — E7800 Pure hypercholesterolemia, unspecified: Secondary | ICD-10-CM | POA: Diagnosis not present

## 2019-07-21 DIAGNOSIS — M7989 Other specified soft tissue disorders: Secondary | ICD-10-CM | POA: Diagnosis not present

## 2019-07-21 DIAGNOSIS — I10 Essential (primary) hypertension: Secondary | ICD-10-CM

## 2019-07-21 NOTE — Assessment & Plan Note (Signed)
lipid control important in reducing the progression of atherosclerotic disease. Continue statin therapy  

## 2019-07-21 NOTE — Patient Instructions (Signed)
Edema  Edema is when you have too much fluid in your body or under your skin. Edema may make your legs, feet, and ankles swell up. Swelling is also common in looser tissues, like around your eyes. This is a common condition. It gets more common as you get older. There are many possible causes of edema. Eating too much salt (sodium) and being on your feet or sitting for a long time can cause edema in your legs, feet, and ankles. Hot weather may make edema worse. Edema is usually painless. Your skin may look swollen or shiny. Follow these instructions at home:  Keep the swollen body part raised (elevated) above the level of your heart when you are sitting or lying down.  Do not sit still or stand for a long time.  Do not wear tight clothes. Do not wear garters on your upper legs.  Exercise your legs. This can help the swelling go down.  Wear elastic bandages or support stockings as told by your doctor.  Eat a low-salt (low-sodium) diet to reduce fluid as told by your doctor.  Depending on the cause of your swelling, you may need to limit how much fluid you drink (fluid restriction).  Take over-the-counter and prescription medicines only as told by your doctor. Contact a doctor if:  Treatment is not working.  You have heart, liver, or kidney disease and have symptoms of edema.  You have sudden and unexplained weight gain. Get help right away if:  You have shortness of breath or chest pain.  You cannot breathe when you lie down.  You have pain, redness, or warmth in the swollen areas.  You have heart, liver, or kidney disease and get edema all of a sudden.  You have a fever and your symptoms get worse all of a sudden. Summary  Edema is when you have too much fluid in your body or under your skin.  Edema may make your legs, feet, and ankles swell up. Swelling is also common in looser tissues, like around your eyes.  Raise (elevate) the swollen body part above the level of your  heart when you are sitting or lying down.  Follow your doctor's instructions about diet and how much fluid you can drink (fluid restriction). This information is not intended to replace advice given to you by your health care provider. Make sure you discuss any questions you have with your health care provider. Document Revised: 05/03/2017 Document Reviewed: 05/18/2016 Elsevier Patient Education  2020 [employer]  

## 2019-07-21 NOTE — Assessment & Plan Note (Signed)
blood pressure control important in reducing the progression of atherosclerotic disease. On appropriate oral medications.  

## 2019-07-21 NOTE — Progress Notes (Signed)
Patient ID: Mary Kennedy, female   DOB: 06-29-1940, 79 y.o.   MRN: [id number]  Chief Complaint  Patient presents with  . New Patient (Initial Visit)    Lymphedema suspected vemous insuff, venous stasis discoloration to the right leg , varicose veins , swelling , lower leg pain    HPI Mary Kennedy is a 79 y.o. female.  I am asked to see the patient by Joette Catching, PA-C for evaluation of previous cellulitis, leg discoloration and swelling.  The patient reports having had cellulitis episodes and now noticing increasing redness of both lower extremities.  She does say that since the pandemic has come, she has been much less active with a lot more sitting with her legs dependent.  She says she was also diagnosed with a slight amount of congestive heart failure.  Both lower extremities are affected, but the right is a little worse and is the one that had the cellulitis previously.  This is also the leg that she had a traumatic injury on several years back.  No open wounds or rest pain.  No fevers or chills.  When she has had her legs dependent or has been on them for much of the day, her leg swelling is worse.  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  She also purchased a recliner and is trying to elevate her legs more   Past Medical History:  Diagnosis Date  . Allergy   . Cellulitis   . Heart murmur   . Hyperlipidemia   . Hypertension   . Hypothyroidism   . Thyroid disease     Past Surgical History:  Procedure Laterality Date  . ABDOMINAL HYSTERECTOMY  1961   BIL Oophorectomy  . APPENDECTOMY  at age 60  . BILATERAL CARPAL TUNNEL RELEASE  1999  . CARDIAC CATHETERIZATION    . COLONOSCOPY WITH PROPOFOL N/A 08/27/2017   Procedure: COLONOSCOPY WITH PROPOFOL;  Surgeon: Lucilla Lame, MD;  Location: [facility] ENDOSCOPY;  Service: Endoscopy;  Laterality: N/A;  . FOOT SURGERY Bilateral   . TONSILLECTOMY  at age 31     Family  History  Problem Relation Age of Onset  . Pancreatitis Mother   . Drug abuse Brother        PTSD  . Cancer Brother        pancreas lesion  . Colon cancer Brother   . Heart attack Father   . Lung cancer Brother   . Cancer Brother        lung cancer  . Breast cancer Cousin        2nd cousin    Social History   Tobacco Use  . Smoking status: Former Smoker    Types: Cigarettes    Quit date: 05/13/1996    Years since quitting: 23.2  . Smokeless tobacco: Never Used  Substance Use Topics  . Alcohol use: Yes    Alcohol/week: 0.0 - 2.0 standard drinks    Comment: occasionally   . Drug use: No     Allergies  Allergen Reactions  . Ciprofloxacin   . Tegaderm Ag Mesh 2"X2"  [Wound Dressings]     when removing the patch her skin came off.  . Latex Rash  . Lisinopril Cough  . Penicillins Rash    Current Outpatient Medications  Medication Sig Dispense Refill  . aspirin EC 81 MG tablet Take 81 mg by mouth. Every 4 days    .  atorvastatin (LIPITOR) 20 MG tablet TAKE 1 TABLET BY MOUTH AT  BEDTIME FOR CHOLESTEROL 90 tablet 3  . Biotin (BIOTIN 5000) 5 MG CAPS Take 10 mg by mouth.     . Calcium Carbonate-Vitamin D 600-200 MG-UNIT TABS Take 2 tablets by mouth daily.    . Cholecalciferol (VITAMIN D3) 50 MCG (2000 UT) capsule Take 2,000 Units by mouth daily.    . Chromium Picolinate 1000 MCG TABS Take 500 mcg by mouth daily.     . Cyanocobalamin (B-12) 2500 MCG TABS Take 2,500 mcg by mouth daily.    . Echinacea 450 MG CAPS Take 1 capsule by mouth daily.    . furosemide (LASIX) 20 MG tablet Take 1 tablet (20 mg total) by mouth daily. 90 tablet 1  . hydrochlorothiazide (HYDRODIURIL) 12.5 MG tablet TAKE 1 TABLET BY MOUTH  DAILY 90 tablet 3  . Krill Oil 350 MG CAPS Take 1 tablet by mouth daily.    Marland Kitchen levothyroxine (SYNTHROID) 112 MCG tablet TAKE 1 TABLET BY MOUTH  DAILY BEFORE BREAKFAST 90 tablet 3  . losartan (COZAAR) 100 MG tablet TAKE 1 TABLET BY MOUTH  DAILY 90 tablet 3  . Magnesium 250  MG TABS Take 1 tablet (250 mg total) by mouth daily. (Patient taking differently: Take 25 mg by mouth daily. ) 30 tablet 0  . MULTIPLE VITAMIN PO Take 1 tablet by mouth daily.     . Multiple Vitamins-Minerals (ICAPS AREDS 2 PO) Take by mouth daily.    . naproxen sodium (ALEVE) 220 MG tablet Take 220 mg by mouth daily as needed.    . Omega-3 Fatty Acids (FISH OIL EXTRA STRENGTH) 1200 MG CAPS Take 1 capsule by mouth daily.    . potassium chloride SA (KLOR-CON) 20 MEQ tablet Take 1 tablet (20 mEq total) by mouth daily. With lasix 90 tablet 1  . SUPER B COMPLEX/C PO Take 1 tablet by mouth. Twice a week    . vitamin E 400 UNIT capsule Take 400 Units by mouth daily.    . Zinc 50 MG CAPS Take 50 mg by mouth daily.    Marland Kitchen acetaminophen (TYLENOL) 650 MG CR tablet Take 650 mg by mouth every 8 (eight) hours as needed for pain.    Marland Kitchen albuterol (PROVENTIL HFA;VENTOLIN HFA) 108 (90 Base) MCG/ACT inhaler Inhale 2 puffs into the lungs every 6 (six) hours as needed for wheezing or shortness of breath. (Patient not taking: Reported on 07/02/2019) 1 Inhaler 0  . Coenzyme Q10 100 MG TABS Take 1 capsule by mouth daily.    . fluticasone (FLONASE) 50 MCG/ACT nasal spray Place 2 sprays into both nostrils daily. (Patient not taking: Reported on 07/21/2019) 16 g 6  . Glucos-Chond-Hyal Ac-Ca Fructo (MOVE FREE JOINT HEALTH ADVANCE) TABS Take 2 tablets by mouth daily.     . simethicone (MYLICON) 417 MG chewable tablet Chew 125 mg by mouth daily.    . Tiotropium Bromide Monohydrate (SPIRIVA RESPIMAT) 2.5 MCG/ACT AERS Inhale 1 puff into the lungs daily. (Patient not taking: Reported on 06/12/2019) 4 g 5  . triamcinolone cream (KENALOG) 0.1 % Apply 1 application topically 2 (two) times daily. (Patient not taking: Reported on 07/21/2019) 80 g 1   No current facility-administered medications for this visit.      REVIEW OF SYSTEMS (Negative unless checked)  Constitutional: [] Weight loss  [] Fever  [] Chills Cardiac: [] Chest pain    [] Chest pressure   [] Palpitations   [] Shortness of breath when laying flat   [] Shortness of  breath at rest   [] Shortness of breath with exertion. Vascular:  [] Pain in legs with walking   [] Pain in legs at rest   [] Pain in legs when laying flat   [] Claudication   [] Pain in feet when walking  [] Pain in feet at rest  [] Pain in feet when laying flat   [] History of DVT   [] Phlebitis   [x] Swelling in legs   [x] Varicose veins   [] Non-healing ulcers Pulmonary:   [] Uses home oxygen   [] Productive cough   [] Hemoptysis   [] Wheeze  [] COPD   [] Asthma Neurologic:  [] Dizziness  [] Blackouts   [] Seizures   [] History of stroke   [] History of TIA  [] Aphasia   [] Temporary blindness   [] Dysphagia   [] Weakness or numbness in arms   [] Weakness or numbness in legs Musculoskeletal:  [x] Arthritis   [] Joint swelling   [x] Joint pain   [] Low back pain Hematologic:  [] Easy bruising  [] Easy bleeding   [] Hypercoagulable state   [] Anemic  [] Hepatitis Gastrointestinal:  [] Blood in stool   [] Vomiting blood  [] Gastroesophageal reflux/heartburn   [] Abdominal pain Genitourinary:  [] Chronic kidney disease   [] Difficult urination  [] Frequent urination  [] Burning with urination   [] Hematuria Skin:  [] Rashes   [] Ulcers   [] Wounds Psychological:  [] History of anxiety   []  History of major depression.    Physical Exam BP (!) 146/80   Pulse (!) 128   Resp 20   Ht 5\' 6"  (1.676 m)   Wt 276 lb (125.2 kg)   BMI 44.55 kg/m  Gen:  WD/WN, NAD Head: [place]/AT, No temporalis wasting. Ear/Nose/Throat: Hearing grossly intact, nares w/o erythema or drainage, oropharynx w/o Erythema/Exudate Eyes: Conjunctiva clear, sclera non-icteric  Neck: trachea midline.  No JVD.  Pulmonary:  Good air movement, respirations not labored, no use of accessory muscles  Cardiac: RRR, no JVD Vascular:  Vessel Right Left  Radial Palpable Palpable                                    Musculoskeletal: M/S 5/5 throughout.  Extremities without ischemic changes.   No deformity or atrophy.  1+ bilateral lower extremity edema.  Mild stasis dermatitis changes present to both lower extremities Neurologic: Sensation grossly intact in extremities.  Symmetrical.  Speech is fluent. Motor exam as listed above. Psychiatric: Judgment intact, Mood & affect appropriate for pt's clinical situation. Dermatologic: No rashes or ulcers noted.  No cellulitis or open wounds.    Radiology No results found.  Labs Recent Results (from the past 2160 hour(s))  POCT urinalysis dipstick     Status: None   Collection Time: 06/12/19  7:46 AM  Result Value Ref Range   Color, UA light Yellow    Clarity, UA Clear    Glucose, UA Negative Negative   Bilirubin, UA Negative    Ketones, UA Negative    Spec Grav, UA 1.010 1.010 - 1.025   Blood, UA Negative    pH, UA 6.0 5.0 - 8.0   Protein, UA Negative Negative   Urobilinogen, UA 0.2 0.2 or 1.0 E.U./dL   Nitrite, UA Negative    Leukocytes, UA Negative Negative   Appearance     Odor Negative   Urine Culture     Status: None   Collection Time: 06/12/19  7:48 AM   Specimen: Urine   UR  Result Value Ref Range   Urine Culture, Routine Final report    Organism  ID, Bacteria Comment     Comment: Mixed urogenital flora 10,000-25,000 colony forming units per mL     Assessment/Plan:  Hyperlipidemia lipid control important in reducing the progression of atherosclerotic disease. Continue statin therapy   Benign hypertension blood pressure control important in reducing the progression of atherosclerotic disease. On appropriate oral medications.   Swelling of limb I have had a long discussion with the patient regarding swelling and why it  causes symptoms.  Patient has recently started wearing compression stockings under the guidance of her primary care physician and will continue to do this. The patient will  beginning wearing the stockings first thing in the morning and removing them in the evening. The patient is instructed  specifically not to sleep in the stockings.   In addition, behavioral modification will be initiated.  This will include frequent elevation, use of over the counter pain medications and exercise such as walking.  I have reviewed systemic causes for chronic edema such as liver, kidney and cardiac etiologies.  The patient denies problems with these organ systems.    Consideration for a lymph pump will also be made based upon the effectiveness of conservative therapy.  This would help to improve the edema control and prevent sequela such as ulcers and infections   Patient should undergo duplex ultrasound of the venous system to ensure that DVT or reflux is not present.  The patient will follow-up with me after the ultrasound.        Leotis Pain 07/21/2019, 11:12 AM   This note was created with Dragon medical transcription system.  Any errors from dictation are unintentional.

## 2019-07-21 NOTE — Assessment & Plan Note (Signed)
I have had a long discussion with the patient regarding swelling and why it  causes symptoms.  Patient has recently started wearing compression stockings under the guidance of her primary care physician and will continue to do this. The patient will  beginning wearing the stockings first thing in the morning and removing them in the evening. The patient is instructed specifically not to sleep in the stockings.   In addition, behavioral modification will be initiated.  This will include frequent elevation, use of over the counter pain medications and exercise such as walking.  I have reviewed systemic causes for chronic edema such as liver, kidney and cardiac etiologies.  The patient denies problems with these organ systems.    Consideration for a lymph pump will also be made based upon the effectiveness of conservative therapy.  This would help to improve the edema control and prevent sequela such as ulcers and infections   Patient should undergo duplex ultrasound of the venous system to ensure that DVT or reflux is not present.  The patient will follow-up with me after the ultrasound.

## 2019-07-22 DIAGNOSIS — M9903 Segmental and somatic dysfunction of lumbar region: Secondary | ICD-10-CM | POA: Diagnosis not present

## 2019-07-22 DIAGNOSIS — M62830 Muscle spasm of back: Secondary | ICD-10-CM | POA: Diagnosis not present

## 2019-07-22 DIAGNOSIS — M9902 Segmental and somatic dysfunction of thoracic region: Secondary | ICD-10-CM | POA: Diagnosis not present

## 2019-07-22 DIAGNOSIS — M5136 Other intervertebral disc degeneration, lumbar region: Secondary | ICD-10-CM | POA: Diagnosis not present

## 2019-08-03 ENCOUNTER — Telehealth (INDEPENDENT_AMBULATORY_CARE_PROVIDER_SITE_OTHER): Payer: Self-pay

## 2019-08-03 DIAGNOSIS — M9903 Segmental and somatic dysfunction of lumbar region: Secondary | ICD-10-CM | POA: Diagnosis not present

## 2019-08-03 DIAGNOSIS — M9902 Segmental and somatic dysfunction of thoracic region: Secondary | ICD-10-CM | POA: Diagnosis not present

## 2019-08-03 DIAGNOSIS — M5136 Other intervertebral disc degeneration, lumbar region: Secondary | ICD-10-CM | POA: Diagnosis not present

## 2019-08-03 DIAGNOSIS — M62830 Muscle spasm of back: Secondary | ICD-10-CM | POA: Diagnosis not present

## 2019-08-03 NOTE — Telephone Encounter (Signed)
She can come in with the scheduled studies with whatever the schedule permits

## 2019-08-03 NOTE — Telephone Encounter (Signed)
PATIENT CALLED BACK AND REQUESTED TO SEE [facility] WITH [facility] APPT, APPT SCHEDULED FOR PATIENT

## 2019-08-03 NOTE — Telephone Encounter (Signed)
CALLED PATIENT TO SEE IF SHE IS OK WITH SEEING [facility] SO WE COULD GET HER IN [facility]ON[facility]. NO OTHER APPTS AVAILABLE FOR [facility] UNTIL 4/13 WHICH IS HER SCHEDULED APPT. LEFT VM FOR PATIENT TO CALL us BACK.

## 2019-08-05 ENCOUNTER — Ambulatory Visit (INDEPENDENT_AMBULATORY_CARE_PROVIDER_SITE_OTHER): Payer: Medicare Other

## 2019-08-05 ENCOUNTER — Other Ambulatory Visit: Payer: Self-pay

## 2019-08-05 ENCOUNTER — Ambulatory Visit (INDEPENDENT_AMBULATORY_CARE_PROVIDER_SITE_OTHER): Payer: Medicare Other | Admitting: Nurse Practitioner

## 2019-08-05 ENCOUNTER — Encounter (INDEPENDENT_AMBULATORY_CARE_PROVIDER_SITE_OTHER): Payer: Self-pay | Admitting: Nurse Practitioner

## 2019-08-05 VITALS — BP 145/76 | HR 109 | Resp 12 | Ht 66.0 in | Wt 274.0 lb

## 2019-08-05 DIAGNOSIS — M79605 Pain in left leg: Secondary | ICD-10-CM | POA: Diagnosis not present

## 2019-08-05 DIAGNOSIS — M7989 Other specified soft tissue disorders: Secondary | ICD-10-CM

## 2019-08-05 DIAGNOSIS — M79604 Pain in right leg: Secondary | ICD-10-CM | POA: Diagnosis not present

## 2019-08-05 DIAGNOSIS — I10 Essential (primary) hypertension: Secondary | ICD-10-CM

## 2019-08-05 NOTE — Telephone Encounter (Signed)
PT arrived in clinic this morning 3/24 at 7:30 with the expectation of being seen. states she was called yesterday and recieved it by mychart. I researched schedule and gave her an opening for this morning at 9:30

## 2019-08-05 NOTE — Progress Notes (Signed)
SUBJECTIVE:  Patient ID: Mary Kennedy, female    DOB: 03/24/1941, 79 y.o.   MRN: [id number] Chief Complaint  Patient presents with  . Follow-up    U/S follow up    HPI  Mary Kennedy is a 79 y.o. female presents to our office with complaints of persistent lower extremity pain in addition to lower extremity swelling.  The patient describes the pain as getting heavy throughout the day with an achy feeling.  There are also times when she has a shooting pain behind the knee.  In other instances the patient has extreme sensitivity to touch on her lower extremities.  The patient states that the sensitivity to touch happens more so at the end of the day.  The patient recently had an episode of cellulitis that is currently resolved.  She has recently begun wearing medical grade 1 compression stockings on a regular basis and that also helps her lower extremity edema significantly.  The patient performs manual lymphatic massage which is also helpful for her.  The patient does have a history of severe mitral regurgitation and utilization of her diuretics also helps contain her swelling.  The patient does endorse having a history of lower back pain as well as the issues.  She denies any claudication-like symptoms.  Today noninvasive study showed no evidence of DVT, superficial venous thrombosis, deep venous insufficiency or superficial venous reflux bilaterally.  Past Medical History:  Diagnosis Date  . Allergy   . Cellulitis   . Heart murmur   . Hyperlipidemia   . Hypertension   . Hypothyroidism   . Thyroid disease     Past Surgical History:  Procedure Laterality Date  . ABDOMINAL HYSTERECTOMY  1961   BIL Oophorectomy  . APPENDECTOMY  at age 62  . BILATERAL CARPAL TUNNEL RELEASE  1999  . CARDIAC CATHETERIZATION    . COLONOSCOPY WITH PROPOFOL N/A 08/27/2017   Procedure: COLONOSCOPY WITH PROPOFOL;  Surgeon: Lucilla Lame, MD;  Location: [facility] ENDOSCOPY;  Service: Endoscopy;  Laterality:  N/A;  . FOOT SURGERY Bilateral   . TONSILLECTOMY  at age 39    Social History   Socioeconomic History  . Marital status: Divorced    Spouse name: Not on file  . Number of children: 2  . Years of education: Not on file  . Highest education level: Some college, no degree  Occupational History  . Occupation: Retired    Comment: working part-time  Tobacco Use  . Smoking status: Former Smoker    Types: Cigarettes    Quit date: 05/13/1996    Years since quitting: 23.2  . Smokeless tobacco: Never Used  Substance and Sexual Activity  . Alcohol use: Yes    Alcohol/week: 0.0 - 2.0 standard drinks    Comment: occasionally   . Drug use: No  . Sexual activity: Not on file  Other Topics Concern  . Not on file  Social History Narrative  . Not on file   Social Determinants of Health   Financial Resource Strain: Low Risk   . Difficulty of Paying Living Expenses: Not hard at all  Food Insecurity: No Food Insecurity  . Worried About Charity fundraiser in the Last Year: Never true  . Ran Out of Food in the Last Year: Never true  Transportation Needs: No Transportation Needs  . Lack of Transportation (Medical): No  . Lack of Transportation (Non-Medical): No  Physical Activity: Inactive  . Days of Exercise per Week: 0 days  .  Minutes of Exercise per Session: 0 min  Stress: Stress Concern Present  . Feeling of Stress : To some extent  Social Connections: Somewhat Isolated  . Frequency of Communication with Friends and Family: More than three times a week  . Frequency of Social Gatherings with Friends and Family: Once a week  . Attends Religious Services: More than 4 times per year  . Active Member of Clubs or Organizations: No  . Attends Archivist Meetings: Never  . Marital Status: Divorced  Human resources officer Violence: Not At Risk  . Fear of Current or Ex-Partner: No  . Emotionally Abused: No  . Physically Abused: No  . Sexually Abused: No    Family History  Problem  Relation Age of Onset  . Pancreatitis Mother   . Drug abuse Brother        PTSD  . Cancer Brother        pancreas lesion  . Colon cancer Brother   . Heart attack Father   . Lung cancer Brother   . Cancer Brother        lung cancer  . Breast cancer Cousin        2nd cousin    Allergies  Allergen Reactions  . Ciprofloxacin   . Tegaderm Ag Mesh 2"X2"  [Wound Dressings]     when removing the patch her skin came off.  . Latex Rash  . Lisinopril Cough  . Penicillins Rash     Review of Systems   Review of Systems: Negative Unless Checked Constitutional: [] Weight loss  [] Fever  [] Chills Cardiac: [] Chest pain   []  Atrial Fibrillation  [] Palpitations   [] Shortness of breath when laying flat   [] Shortness of breath with exertion. [] Shortness of breath at rest Vascular:  [] Pain in legs with walking   [x] Pain in legs with standing [] Pain in legs when laying flat   [] Claudication    [x] Pain in feet when laying flat    [] History of DVT   [] Phlebitis   [x] Swelling in legs   [] Varicose veins   [] Non-healing ulcers Pulmonary:   [] Uses home oxygen   [] Productive cough   [] Hemoptysis   [] Wheeze  [] COPD   [] Asthma Neurologic:  [] Dizziness   [] Seizures  [] Blackouts [] History of stroke   [] History of TIA  [] Aphasia   [] Temporary Blindness   [] Weakness or numbness in arm   [] Weakness or numbness in leg Musculoskeletal:   [] Joint swelling   [] Joint pain   [x] Low back pain  []  History of Knee Replacement [x] Arthritis [] back Surgeries  []  Spinal Stenosis    Hematologic:  [] Easy bruising  [] Easy bleeding   [] Hypercoagulable state   [] Anemic Gastrointestinal:  [] Diarrhea   [] Vomiting  [] Gastroesophageal reflux/heartburn   [] Difficulty swallowing. [] Abdominal pain Genitourinary:  [] Chronic kidney disease   [] Difficult urination  [] Anuric   [] Blood in urine [] Frequent urination  [] Burning with urination   [] Hematuria Skin:  [] Rashes   [] Ulcers [] Wounds Psychological:  [] History of anxiety   []  History of  major depression  []  Memory Difficulties      OBJECTIVE:   Physical Exam  BP (!) 145/76   Pulse (!) 109   Resp 12   Ht 5\' 6"  (1.676 m)   Wt 274 lb (124.3 kg)   BMI 44.22 kg/m   Gen: WD/WN, NAD Head: [place]/AT, No temporalis wasting.  Ear/Nose/Throat: Hearing grossly intact, nares w/o erythema or drainage Eyes: PER, EOMI, sclera nonicteric.  Neck: Supple, no masses.  No JVD.  Pulmonary:  Good air  movement, no use of accessory muscles.  Cardiac: RRR Vascular:  1+ edema nonpitting bilaterally  Gastrointestinal: soft, non-distended. No guarding/no peritoneal signs.  Musculoskeletal: M/S 5/5 throughout.  No deformity or atrophy.  Neurologic: Pain and light touch intact in extremities.  Symmetrical.  Speech is fluent. Motor exam as listed above. Psychiatric: Judgment intact, Mood & affect appropriate for pt's clinical situation. Dermatologic:  Mild stasis dermatitis. No Ulcers Noted.  No changes consistent with cellulitis. Lymph : No Cervical lymphadenopathy, no lichenification or skin changes of chronic lymphedema.       ASSESSMENT AND PLAN:  1. Swelling of limb The patient's lower extremity edema is likely multifactorial in cause.  The patient also has evidence of severe mitral regurgitation which can cause edema at certain periods.  Based on the patient's history she has had issues with swelling over time which also likely contribute to scarring of the lymphatic channels which is consistent with lymphedema.  The patient does have good conservative therapy tactics utilizing medical grade 1 compression stockings, elevating her lower extremities and exercise when able.  We discussed utilization of lymphedema pump however patient's previous experience with leg massager's have not been very verbal.  Patient will continue to do manual lymphatic massage.  We will have the patient return in 6 months to reevaluate lower extremity edema however the patient is encouraged to contact her office  sooner if she has worsening issues.  2. Benign hypertension Adequate blood pressure control today.  Patient on appropriate medications.  No medication changes at this time.   3. Pain in both lower extremities Recommend:  I do not find evidence of Vascular pathology that would explain the patient's symptoms  The patient has atypical pain symptoms for vascular disease  I do not find evidence of Vascular pathology that would explain the patient's symptoms and I suspect the patient is c/o pseudoclaudication.  Patient should have an evaluation of his LS spine which I defer to the primary service.  Noninvasive studies including venous ultrasound of the legs do not identify vascular problems  The patient should continue walking and begin a more formal exercise program. The patient should continue his antiplatelet therapy and aggressive treatment of the lipid abnormalities. The patient should continue wearing graduated compression socks 15-20 mmHg strength to control her mild edema.   Further work-up of her lower extremity pain is deferred to the primary service      Current Outpatient Medications on File Prior to Visit  Medication Sig Dispense Refill  . acetaminophen (TYLENOL) 650 MG CR tablet Take 650 mg by mouth every 8 (eight) hours as needed for pain.    Marland Kitchen albuterol (PROVENTIL HFA;VENTOLIN HFA) 108 (90 Base) MCG/ACT inhaler Inhale 2 puffs into the lungs every 6 (six) hours as needed for wheezing or shortness of breath. 1 Inhaler 0  . aspirin EC 81 MG tablet Take 81 mg by mouth. Every 4 days    . atorvastatin (LIPITOR) 20 MG tablet TAKE 1 TABLET BY MOUTH AT  BEDTIME FOR CHOLESTEROL 90 tablet 3  . Biotin (BIOTIN 5000) 5 MG CAPS Take 10 mg by mouth.     . Calcium Carbonate-Vitamin D 600-200 MG-UNIT TABS Take 2 tablets by mouth daily.    . Cholecalciferol (VITAMIN D3) 50 MCG (2000 UT) capsule Take 2,000 Units by mouth daily.    . Chromium Picolinate 1000 MCG TABS Take 500 mcg by mouth  daily.     . Coenzyme Q10 100 MG TABS Take 1 capsule by mouth daily.    Marland Kitchen  Cyanocobalamin (B-12) 2500 MCG TABS Take 2,500 mcg by mouth daily.    . Echinacea 450 MG CAPS Take 1 capsule by mouth daily.    . fluticasone (FLONASE) 50 MCG/ACT nasal spray Place 2 sprays into both nostrils daily. 16 g 6  . furosemide (LASIX) 20 MG tablet Take 1 tablet (20 mg total) by mouth daily. 90 tablet 1  . Glucos-Chond-Hyal Ac-Ca Fructo (MOVE FREE JOINT HEALTH ADVANCE) TABS Take 2 tablets by mouth daily.     . hydrochlorothiazide (HYDRODIURIL) 12.5 MG tablet TAKE 1 TABLET BY MOUTH  DAILY 90 tablet 3  . Krill Oil 350 MG CAPS Take 1 tablet by mouth daily.    Marland Kitchen levothyroxine (SYNTHROID) 112 MCG tablet TAKE 1 TABLET BY MOUTH  DAILY BEFORE BREAKFAST 90 tablet 3  . losartan (COZAAR) 100 MG tablet TAKE 1 TABLET BY MOUTH  DAILY 90 tablet 3  . Magnesium 250 MG TABS Take 1 tablet (250 mg total) by mouth daily. (Patient taking differently: Take 25 mg by mouth daily. ) 30 tablet 0  . MULTIPLE VITAMIN PO Take 1 tablet by mouth daily.     . Multiple Vitamins-Minerals (ICAPS AREDS 2 PO) Take by mouth daily.    . naproxen sodium (ALEVE) 220 MG tablet Take 220 mg by mouth daily as needed.    . Omega-3 Fatty Acids (FISH OIL EXTRA STRENGTH) 1200 MG CAPS Take 1 capsule by mouth daily.    . potassium chloride SA (KLOR-CON) 20 MEQ tablet Take 1 tablet (20 mEq total) by mouth daily. With lasix 90 tablet 1  . simethicone (MYLICON) 588 MG chewable tablet Chew 125 mg by mouth daily.    . SUPER B COMPLEX/C PO Take 1 tablet by mouth. Twice a week    . Tiotropium Bromide Monohydrate (SPIRIVA RESPIMAT) 2.5 MCG/ACT AERS Inhale 1 puff into the lungs daily. 4 g 5  . triamcinolone cream (KENALOG) 0.1 % Apply 1 application topically 2 (two) times daily. 80 g 1  . vitamin E 400 UNIT capsule Take 400 Units by mouth daily.    . Zinc 50 MG CAPS Take 50 mg by mouth daily.     No current facility-administered medications on file prior to visit.     There are no Patient Instructions on file for this visit. No follow-ups on file.   Kris Hartmann, NP  This note was completed with Sales executive.  Any errors are purely unintentional.

## 2019-08-10 DIAGNOSIS — M9903 Segmental and somatic dysfunction of lumbar region: Secondary | ICD-10-CM | POA: Diagnosis not present

## 2019-08-10 DIAGNOSIS — M9902 Segmental and somatic dysfunction of thoracic region: Secondary | ICD-10-CM | POA: Diagnosis not present

## 2019-08-10 DIAGNOSIS — M62830 Muscle spasm of back: Secondary | ICD-10-CM | POA: Diagnosis not present

## 2019-08-10 DIAGNOSIS — M5136 Other intervertebral disc degeneration, lumbar region: Secondary | ICD-10-CM | POA: Diagnosis not present

## 2019-08-13 ENCOUNTER — Other Ambulatory Visit: Payer: Self-pay | Admitting: Physician Assistant

## 2019-08-13 DIAGNOSIS — E039 Hypothyroidism, unspecified: Secondary | ICD-10-CM

## 2019-08-17 DIAGNOSIS — M9903 Segmental and somatic dysfunction of lumbar region: Secondary | ICD-10-CM | POA: Diagnosis not present

## 2019-08-17 DIAGNOSIS — M5136 Other intervertebral disc degeneration, lumbar region: Secondary | ICD-10-CM | POA: Diagnosis not present

## 2019-08-17 DIAGNOSIS — M62830 Muscle spasm of back: Secondary | ICD-10-CM | POA: Diagnosis not present

## 2019-08-17 DIAGNOSIS — M9902 Segmental and somatic dysfunction of thoracic region: Secondary | ICD-10-CM | POA: Diagnosis not present

## 2019-08-17 DIAGNOSIS — H2512 Age-related nuclear cataract, left eye: Secondary | ICD-10-CM | POA: Diagnosis not present

## 2019-08-17 DIAGNOSIS — I509 Heart failure, unspecified: Secondary | ICD-10-CM | POA: Diagnosis not present

## 2019-08-24 DIAGNOSIS — M62830 Muscle spasm of back: Secondary | ICD-10-CM | POA: Diagnosis not present

## 2019-08-24 DIAGNOSIS — M9903 Segmental and somatic dysfunction of lumbar region: Secondary | ICD-10-CM | POA: Diagnosis not present

## 2019-08-24 DIAGNOSIS — M5136 Other intervertebral disc degeneration, lumbar region: Secondary | ICD-10-CM | POA: Diagnosis not present

## 2019-08-24 DIAGNOSIS — M9902 Segmental and somatic dysfunction of thoracic region: Secondary | ICD-10-CM | POA: Diagnosis not present

## 2019-08-25 ENCOUNTER — Encounter (INDEPENDENT_AMBULATORY_CARE_PROVIDER_SITE_OTHER): Payer: Medicare Other

## 2019-08-25 ENCOUNTER — Other Ambulatory Visit: Payer: Self-pay

## 2019-08-25 ENCOUNTER — Ambulatory Visit (INDEPENDENT_AMBULATORY_CARE_PROVIDER_SITE_OTHER): Payer: Medicare Other | Admitting: Nurse Practitioner

## 2019-08-25 ENCOUNTER — Ambulatory Visit (INDEPENDENT_AMBULATORY_CARE_PROVIDER_SITE_OTHER): Payer: Medicare Other | Admitting: Vascular Surgery

## 2019-08-25 ENCOUNTER — Encounter: Payer: Self-pay | Admitting: Ophthalmology

## 2019-08-25 NOTE — Anesthesia Preprocedure Evaluation (Addendum)
Anesthesia Evaluation  Patient identified by MRN, date of birth, ID band Patient awake    Reviewed: Allergy & Precautions, NPO status , Patient's Chart, lab work & pertinent test results, reviewed documented beta blocker date and time   History of Anesthesia Complications Negative for: history of anesthetic complications  Airway Mallampati: II  TM Distance: >3 FB Neck ROM: Full    Dental   Pulmonary former smoker,    breath sounds clear to auscultation       Cardiovascular hypertension, (-) angina+ Peripheral Vascular Disease  (-) DOE + dysrhythmias (RBBB) Atrial Fibrillation + Valvular Problems/Murmurs MR  Rhythm:Irregular Rate:Tachycardia   HLD  Stress test (01/2018): LVEF= 65% Negative for ischemia  TTE (01/2018): LVEF >55% Mod-severe MR Mild TR Mild biatrial enlargement Mild LVH   Neuro/Psych    GI/Hepatic neg GERD  ,  Endo/Other  Hypothyroidism   Renal/GU      Musculoskeletal  (+) Arthritis ,   Abdominal (+) + obese (BMI 46),   Peds  Hematology   Anesthesia Other Findings  New atrial fibrillation noted in preop today on 12 lead EKG. Per patient, has never been diagnosed w/ A-fib, but does have significant cardiac history. Was seen in 06/2019 by cardiologist, and was stable from a cardiac standpoint.  Reproductive/Obstetrics                            Anesthesia Physical Anesthesia Plan  ASA: III  Anesthesia Plan: MAC   Post-op Pain Management:    Induction: Intravenous  PONV Risk Score and Plan: 2 and TIVA, Midazolam and Treatment may vary due to age or medical condition  Airway Management Planned: Nasal Cannula  Additional Equipment:   Intra-op Plan:   Post-operative Plan:   Informed Consent: I have reviewed the patients History and Physical, chart, labs and discussed the procedure including the risks, benefits and alternatives for the proposed anesthesia with the  patient or authorized representative who has indicated his/her understanding and acceptance.       Plan Discussed with: CRNA and Anesthesiologist  Anesthesia Plan Comments: (Will proceed with cataract surgery today despite new a-fib diagnosis given that the surgery is minimally invasive and that the anesthetic is also minimal. Patient asked to see her cardiologist ASAP.)       Anesthesia Quick Evaluation

## 2019-08-28 ENCOUNTER — Other Ambulatory Visit
Admission: RE | Admit: 2019-08-28 | Discharge: 2019-08-28 | Disposition: A | Discharge status: Home / Self Care | Payer: Medicare Other | Source: Ambulatory Visit | Attending: Ophthalmology | Admitting: Ophthalmology

## 2019-08-28 DIAGNOSIS — Z01812 Encounter for preprocedural laboratory examination: Secondary | ICD-10-CM | POA: Insufficient documentation

## 2019-08-28 DIAGNOSIS — Z20822 Contact with and (suspected) exposure to covid-19: Secondary | ICD-10-CM | POA: Insufficient documentation

## 2019-08-28 LAB — SARS CORONAVIRUS 2 (TAT 6-24 HRS): SARS Coronavirus 2: NEGATIVE

## 2019-08-28 NOTE — Discharge Instructions (Signed)

## 2019-08-31 ENCOUNTER — Other Ambulatory Visit: Payer: Self-pay | Admitting: Physician Assistant

## 2019-08-31 DIAGNOSIS — E039 Hypothyroidism, unspecified: Secondary | ICD-10-CM

## 2019-08-31 MED ORDER — LEVOTHYROXINE SODIUM 112 MCG PO TABS: 112.0000 ug | ORAL_TABLET | Freq: Every day | ORAL | 1 refills | Status: DC

## 2019-08-31 NOTE — Progress Notes (Signed)
Refilled levothyroxine.

## 2019-09-01 ENCOUNTER — Ambulatory Visit
Admission: RE | Admit: 2019-09-01 | Discharge: 2019-09-01 | Disposition: A | Discharge status: Home / Self Care | Payer: Medicare Other | Attending: Ophthalmology | Admitting: Ophthalmology

## 2019-09-01 ENCOUNTER — Ambulatory Visit: Payer: Medicare Other | Admitting: Anesthesiology

## 2019-09-01 ENCOUNTER — Encounter: Payer: Self-pay | Admitting: Ophthalmology

## 2019-09-01 ENCOUNTER — Other Ambulatory Visit: Payer: Self-pay

## 2019-09-01 ENCOUNTER — Encounter
Admission: RE | Disposition: A | Discharge status: Home / Self Care | Payer: Self-pay | Source: Home / Self Care | Attending: Ophthalmology

## 2019-09-01 DIAGNOSIS — Z87891 Personal history of nicotine dependence: Secondary | ICD-10-CM | POA: Diagnosis not present

## 2019-09-01 DIAGNOSIS — I4891 Unspecified atrial fibrillation: Secondary | ICD-10-CM | POA: Diagnosis not present

## 2019-09-01 DIAGNOSIS — Z89429 Acquired absence of other toe(s), unspecified side: Secondary | ICD-10-CM | POA: Diagnosis not present

## 2019-09-01 DIAGNOSIS — Z7982 Long term (current) use of aspirin: Secondary | ICD-10-CM | POA: Diagnosis not present

## 2019-09-01 DIAGNOSIS — E785 Hyperlipidemia, unspecified: Secondary | ICD-10-CM | POA: Diagnosis not present

## 2019-09-01 DIAGNOSIS — I509 Heart failure, unspecified: Secondary | ICD-10-CM | POA: Diagnosis not present

## 2019-09-01 DIAGNOSIS — Z79899 Other long term (current) drug therapy: Secondary | ICD-10-CM | POA: Diagnosis not present

## 2019-09-01 DIAGNOSIS — E039 Hypothyroidism, unspecified: Secondary | ICD-10-CM | POA: Insufficient documentation

## 2019-09-01 DIAGNOSIS — I739 Peripheral vascular disease, unspecified: Secondary | ICD-10-CM | POA: Diagnosis not present

## 2019-09-01 DIAGNOSIS — H2512 Age-related nuclear cataract, left eye: Secondary | ICD-10-CM | POA: Insufficient documentation

## 2019-09-01 DIAGNOSIS — I499 Cardiac arrhythmia, unspecified: Secondary | ICD-10-CM | POA: Diagnosis not present

## 2019-09-01 DIAGNOSIS — I110 Hypertensive heart disease with heart failure: Secondary | ICD-10-CM | POA: Diagnosis not present

## 2019-09-01 DIAGNOSIS — Z79890 Hormone replacement therapy (postmenopausal): Secondary | ICD-10-CM | POA: Diagnosis not present

## 2019-09-01 DIAGNOSIS — H25812 Combined forms of age-related cataract, left eye: Secondary | ICD-10-CM | POA: Diagnosis not present

## 2019-09-01 HISTORY — DX: Presence of dental prosthetic device (complete) (partial): Z97.2

## 2019-09-01 HISTORY — PX: CATARACT EXTRACTION W/PHACO: SHX586

## 2019-09-01 SURGERY — PHACOEMULSIFICATION, CATARACT, WITH IOL INSERTION
Anesthesia: Monitor Anesthesia Care | Site: Eye | Laterality: Left

## 2019-09-01 MED ORDER — EPINEPHRINE PF 1 MG/ML IJ SOLN
INTRAOCULAR | Status: DC | PRN
  Administered 2019-09-01: 58 mL via OPHTHALMIC

## 2019-09-01 MED ORDER — FENTANYL CITRATE (PF) 100 MCG/2ML IJ SOLN
INTRAMUSCULAR | Status: DC | PRN
  Administered 2019-09-01: 50 ug via INTRAVENOUS

## 2019-09-01 MED ORDER — LIDOCAINE HCL (PF) 2 % IJ SOLN
INTRAOCULAR | Status: DC | PRN
  Administered 2019-09-01: 2 mL

## 2019-09-01 MED ORDER — MIDAZOLAM HCL 2 MG/2ML IJ SOLN
INTRAMUSCULAR | Status: DC | PRN
  Administered 2019-09-01: 1 mg via INTRAVENOUS

## 2019-09-01 MED ORDER — NA CHONDROIT SULF-NA HYALURON 40-17 MG/ML IO SOLN
INTRAOCULAR | Status: DC | PRN
  Administered 2019-09-01: 1 mL via INTRAOCULAR

## 2019-09-01 MED ORDER — POLYMYXIN B-TRIMETHOPRIM 10000-0.1 UNIT/ML-% OP SOLN
OPHTHALMIC | Status: DC | PRN
  Administered 2019-09-01: 1 [drp]

## 2019-09-01 MED ORDER — TETRACAINE HCL 0.5 % OP SOLN
1.0000 [drp] | OPHTHALMIC | Status: DC | PRN
  Administered 2019-09-01 (×3): 1 [drp] via OPHTHALMIC

## 2019-09-01 MED ORDER — LACTATED RINGERS IV SOLN: 100.0000 mL/h | INTRAVENOUS | Status: DC

## 2019-09-01 MED ORDER — ARMC OPHTHALMIC DILATING DROPS
1.0000 "application " | OPHTHALMIC | Status: DC | PRN
  Administered 2019-09-01 (×3): 1 via OPHTHALMIC

## 2019-09-01 MED ORDER — BRIMONIDINE TARTRATE-TIMOLOL 0.2-0.5 % OP SOLN
OPHTHALMIC | Status: DC | PRN
  Administered 2019-09-01: 1 [drp] via OPHTHALMIC

## 2019-09-01 SURGICAL SUPPLY — 23 items
CANNULA ANT/CHMB 27G (MISCELLANEOUS) ×2 IMPLANT
CANNULA ANT/CHMB 27GA (MISCELLANEOUS) ×6 IMPLANT
DISSECTOR HYDRO NUCLEUS 50X22 (MISCELLANEOUS) ×3 IMPLANT
GLOVE SURG LX 8.0 MICRO (GLOVE) ×2
GLOVE SURG LX STRL 8.0 MICRO (GLOVE) ×1 IMPLANT
GLOVE SURG TRIUMPH 8.0 PF LTX (GLOVE) ×3 IMPLANT
GOWN STRL REUS W/ TWL LRG LVL3 (GOWN DISPOSABLE) ×2 IMPLANT
GOWN STRL REUS W/TWL LRG LVL3 (GOWN DISPOSABLE) ×4
LENS IOL DIOP 23.5 (Intraocular Lens) ×3 IMPLANT
LENS IOL TECNIS MONO 23.5 (Intraocular Lens) IMPLANT
MARKER SKIN DUAL TIP RULER LAB (MISCELLANEOUS) ×3 IMPLANT
NDL FILTER BLUNT 18X1 1/2 (NEEDLE) ×1 IMPLANT
NEEDLE FILTER BLUNT 18X 1/2SAF (NEEDLE) ×2
NEEDLE FILTER BLUNT 18X1 1/2 (NEEDLE) ×1 IMPLANT
PACK EYE AFTER SURG (MISCELLANEOUS) ×3 IMPLANT
PACK OPTHALMIC (MISCELLANEOUS) ×3 IMPLANT
PACK PORFILIO (MISCELLANEOUS) ×3 IMPLANT
SUT ETHILON 10-0 CS-B-6CS-B-6 (SUTURE)
SUTURE EHLN 10-0 CS-B-6CS-B-6 (SUTURE) IMPLANT
SYR 3ML LL SCALE MARK (SYRINGE) ×3 IMPLANT
SYR TB 1ML LUER SLIP (SYRINGE) ×3 IMPLANT
WATER STERILE IRR 250ML POUR (IV SOLUTION) ×3 IMPLANT
WIPE NON LINTING 3.25X3.25 (MISCELLANEOUS) ×3 IMPLANT

## 2019-09-01 NOTE — Anesthesia Procedure Notes (Signed)
Procedure Name: MAC Date/Time: 09/01/2019 9:00 AM Performed by: Jeannene Patella, CRNA Pre-anesthesia Checklist: Patient identified, Emergency Drugs available, Suction available, Patient being monitored and Timeout performed Patient Re-evaluated:Patient Re-evaluated prior to induction Oxygen Delivery Method: Nasal cannula

## 2019-09-01 NOTE — Anesthesia Postprocedure Evaluation (Signed)
Anesthesia Post Note  [name]  Procedure(s) Performed: CATARACT EXTRACTION PHACO AND INTRAOCULAR LENS PLACEMENT (IOC) LEFT 8.84 00:49.4 (Left Eye)     Patient location during evaluation: PACU Anesthesia Type: MAC Level of consciousness: awake and alert Pain management: pain level controlled Vital Signs Assessment: post-procedure vital signs reviewed and stable Respiratory status: spontaneous breathing, nonlabored ventilation, respiratory function stable and patient connected to nasal cannula oxygen Cardiovascular status: stable and blood pressure returned to baseline Postop Assessment: no apparent nausea or vomiting Anesthetic complications: no    Wendal Wilkie A  Oaklynn Stierwalt

## 2019-09-01 NOTE — H&P (Signed)
All labs reviewed. Abnormal studies sent to patients PCP when indicated.  Previous H&P reviewed, patient examined, there are NO CHANGES.  Mary Kennedy Porfilio4/20/20218:40 AM

## 2019-09-01 NOTE — Op Note (Signed)
PREOPERATIVE DIAGNOSIS:  Nuclear sclerotic cataract of the left eye.   POSTOPERATIVE DIAGNOSIS:  Nuclear sclerotic cataract of the left eye.   OPERATIVE PROCEDURE:@   SURGEON:  Birder Robson, MD.   ANESTHESIA:  Anesthesiologist: Heniser, Fredric Dine, MD CRNA: Jeannene Patella, CRNA  1.      Managed anesthesia care. 2.     0.57ml of Shugarcaine was instilled following the paracentesis   COMPLICATIONS:  None.   TECHNIQUE:   Stop and chop   DESCRIPTION OF PROCEDURE:  The patient was examined and consented in the preoperative holding area where the aforementioned topical anesthesia was applied to the left eye and then brought back to the Operating Room where the left eye was prepped and draped in the usual sterile ophthalmic fashion and a lid speculum was placed. A paracentesis was created with the side port blade and the anterior chamber was filled with viscoelastic. A near clear corneal incision was performed with the steel keratome. A continuous curvilinear capsulorrhexis was performed with a cystotome followed by the capsulorrhexis forceps. Hydrodissection and hydrodelineation were carried out with BSS on a blunt cannula. The lens was removed in a stop and chop  technique and the remaining cortical material was removed with the irrigation-aspiration handpiece. The capsular bag was inflated with viscoelastic and the Technis [id number] lens was placed in the capsular bag without complication. The remaining viscoelastic was removed from the eye with the irrigation-aspiration handpiece. The wounds were hydrated. The anterior chamber was flushed with BSS and the eye was inflated to physiologic pressure.. The wounds were found to be water tight. The eye was dressed with Combigan and Polytrim. The patient was given protective glasses to wear throughout the day and a shield with which to sleep tonight. The patient was also given drops with which to begin a drop regimen today and will follow-up with me in one  day. Implant Name Type Inv. Item Serial No. Manufacturer Lot No. LRB No. Used Action  LENS IOL DIOP 23.5 - [id number] Intraocular Lens LENS IOL DIOP 23.5 [id number] AMO  Left 1 Implanted    Procedure(s): CATARACT EXTRACTION PHACO AND INTRAOCULAR LENS PLACEMENT (IOC) LEFT 8.84 00:49.4 (Left)  Electronically signed: Birder Robson 09/01/2019 9:11 AM

## 2019-09-01 NOTE — Transfer of Care (Signed)
Immediate Anesthesia Transfer of Care Note  [name]  Procedure(s) Performed: CATARACT EXTRACTION PHACO AND INTRAOCULAR LENS PLACEMENT (IOC) LEFT 8.84 00:49.4 (Left Eye)  Patient Location: PACU  Anesthesia Type: MAC  Level of Consciousness: awake, alert  and patient cooperative  Airway and Oxygen Therapy: Patient Spontanous Breathing and Patient connected to supplemental oxygen  Post-op Assessment: Post-op Vital signs reviewed, Patient's Cardiovascular Status Stable, Respiratory Function Stable, Patent Airway and No signs of Nausea or vomiting  Post-op Vital Signs: Reviewed and stable  Complications: No apparent anesthesia complications

## 2019-09-03 DIAGNOSIS — R0602 Shortness of breath: Secondary | ICD-10-CM | POA: Diagnosis not present

## 2019-09-03 DIAGNOSIS — I4891 Unspecified atrial fibrillation: Secondary | ICD-10-CM | POA: Diagnosis not present

## 2019-09-03 DIAGNOSIS — R011 Cardiac murmur, unspecified: Secondary | ICD-10-CM | POA: Diagnosis not present

## 2019-09-03 DIAGNOSIS — R001 Bradycardia, unspecified: Secondary | ICD-10-CM | POA: Diagnosis not present

## 2019-09-03 DIAGNOSIS — I340 Nonrheumatic mitral (valve) insufficiency: Secondary | ICD-10-CM | POA: Diagnosis not present

## 2019-09-07 DIAGNOSIS — M9903 Segmental and somatic dysfunction of lumbar region: Secondary | ICD-10-CM | POA: Diagnosis not present

## 2019-09-07 DIAGNOSIS — M62830 Muscle spasm of back: Secondary | ICD-10-CM | POA: Diagnosis not present

## 2019-09-07 DIAGNOSIS — M9902 Segmental and somatic dysfunction of thoracic region: Secondary | ICD-10-CM | POA: Diagnosis not present

## 2019-09-07 DIAGNOSIS — M5136 Other intervertebral disc degeneration, lumbar region: Secondary | ICD-10-CM | POA: Diagnosis not present

## 2019-09-22 ENCOUNTER — Ambulatory Visit: Admit: 2019-09-22 | Payer: Medicare Other | Admitting: Ophthalmology

## 2019-09-22 SURGERY — PHACOEMULSIFICATION, CATARACT, WITH IOL INSERTION
Anesthesia: Topical | Laterality: Right

## 2019-09-28 DIAGNOSIS — M9902 Segmental and somatic dysfunction of thoracic region: Secondary | ICD-10-CM | POA: Diagnosis not present

## 2019-09-28 DIAGNOSIS — M62830 Muscle spasm of back: Secondary | ICD-10-CM | POA: Diagnosis not present

## 2019-09-28 DIAGNOSIS — M5136 Other intervertebral disc degeneration, lumbar region: Secondary | ICD-10-CM | POA: Diagnosis not present

## 2019-09-28 DIAGNOSIS — M9903 Segmental and somatic dysfunction of lumbar region: Secondary | ICD-10-CM | POA: Diagnosis not present

## 2019-09-29 ENCOUNTER — Telehealth: Payer: Self-pay

## 2019-09-29 DIAGNOSIS — M62830 Muscle spasm of back: Secondary | ICD-10-CM | POA: Diagnosis not present

## 2019-09-29 DIAGNOSIS — M5136 Other intervertebral disc degeneration, lumbar region: Secondary | ICD-10-CM | POA: Diagnosis not present

## 2019-09-29 DIAGNOSIS — M9903 Segmental and somatic dysfunction of lumbar region: Secondary | ICD-10-CM | POA: Diagnosis not present

## 2019-09-29 DIAGNOSIS — M9902 Segmental and somatic dysfunction of thoracic region: Secondary | ICD-10-CM | POA: Diagnosis not present

## 2019-09-29 NOTE — Telephone Encounter (Signed)
Copied from [place] [phone]. Topic: General - Inquiry >> Sep 29, 2019  9:24 AM Mathis Bud wrote: Reason for CRM: Patient is requesting blood work done. Patient has no future lab order. Call back [phone]

## 2019-09-30 DIAGNOSIS — M9903 Segmental and somatic dysfunction of lumbar region: Secondary | ICD-10-CM | POA: Diagnosis not present

## 2019-09-30 DIAGNOSIS — M9902 Segmental and somatic dysfunction of thoracic region: Secondary | ICD-10-CM | POA: Diagnosis not present

## 2019-09-30 DIAGNOSIS — M5136 Other intervertebral disc degeneration, lumbar region: Secondary | ICD-10-CM | POA: Diagnosis not present

## 2019-09-30 DIAGNOSIS — M62830 Muscle spasm of back: Secondary | ICD-10-CM | POA: Diagnosis not present

## 2019-09-30 NOTE — Telephone Encounter (Signed)
Patient advised that Tawanna Sat is not here this week. Will wait on jenni, pt has scheduled appt for 10/16/19.

## 2019-09-30 NOTE — Telephone Encounter (Signed)
I have only seen this patient once in 2019.  I have no idea what labs we're talking about.  Likely can wait until Tawanna Sat returns.  Please discuss with patient

## 2019-10-02 DIAGNOSIS — M9903 Segmental and somatic dysfunction of lumbar region: Secondary | ICD-10-CM | POA: Diagnosis not present

## 2019-10-02 DIAGNOSIS — M5136 Other intervertebral disc degeneration, lumbar region: Secondary | ICD-10-CM | POA: Diagnosis not present

## 2019-10-02 DIAGNOSIS — M62830 Muscle spasm of back: Secondary | ICD-10-CM | POA: Diagnosis not present

## 2019-10-02 DIAGNOSIS — M9902 Segmental and somatic dysfunction of thoracic region: Secondary | ICD-10-CM | POA: Diagnosis not present

## 2019-10-05 DIAGNOSIS — M5136 Other intervertebral disc degeneration, lumbar region: Secondary | ICD-10-CM | POA: Diagnosis not present

## 2019-10-05 DIAGNOSIS — M62830 Muscle spasm of back: Secondary | ICD-10-CM | POA: Diagnosis not present

## 2019-10-05 DIAGNOSIS — M9902 Segmental and somatic dysfunction of thoracic region: Secondary | ICD-10-CM | POA: Diagnosis not present

## 2019-10-05 DIAGNOSIS — M9903 Segmental and somatic dysfunction of lumbar region: Secondary | ICD-10-CM | POA: Diagnosis not present

## 2019-10-07 DIAGNOSIS — M62830 Muscle spasm of back: Secondary | ICD-10-CM | POA: Diagnosis not present

## 2019-10-07 DIAGNOSIS — M5136 Other intervertebral disc degeneration, lumbar region: Secondary | ICD-10-CM | POA: Diagnosis not present

## 2019-10-07 DIAGNOSIS — M9903 Segmental and somatic dysfunction of lumbar region: Secondary | ICD-10-CM | POA: Diagnosis not present

## 2019-10-07 DIAGNOSIS — M9902 Segmental and somatic dysfunction of thoracic region: Secondary | ICD-10-CM | POA: Diagnosis not present

## 2019-10-09 DIAGNOSIS — M9902 Segmental and somatic dysfunction of thoracic region: Secondary | ICD-10-CM | POA: Diagnosis not present

## 2019-10-09 DIAGNOSIS — M62830 Muscle spasm of back: Secondary | ICD-10-CM | POA: Diagnosis not present

## 2019-10-09 DIAGNOSIS — M9903 Segmental and somatic dysfunction of lumbar region: Secondary | ICD-10-CM | POA: Diagnosis not present

## 2019-10-09 DIAGNOSIS — M5136 Other intervertebral disc degeneration, lumbar region: Secondary | ICD-10-CM | POA: Diagnosis not present

## 2019-10-13 DIAGNOSIS — M5136 Other intervertebral disc degeneration, lumbar region: Secondary | ICD-10-CM | POA: Diagnosis not present

## 2019-10-13 DIAGNOSIS — M9903 Segmental and somatic dysfunction of lumbar region: Secondary | ICD-10-CM | POA: Diagnosis not present

## 2019-10-13 DIAGNOSIS — M9902 Segmental and somatic dysfunction of thoracic region: Secondary | ICD-10-CM | POA: Diagnosis not present

## 2019-10-13 DIAGNOSIS — M62830 Muscle spasm of back: Secondary | ICD-10-CM | POA: Diagnosis not present

## 2019-10-15 DIAGNOSIS — M5136 Other intervertebral disc degeneration, lumbar region: Secondary | ICD-10-CM | POA: Diagnosis not present

## 2019-10-15 DIAGNOSIS — M62830 Muscle spasm of back: Secondary | ICD-10-CM | POA: Diagnosis not present

## 2019-10-15 DIAGNOSIS — M9903 Segmental and somatic dysfunction of lumbar region: Secondary | ICD-10-CM | POA: Diagnosis not present

## 2019-10-15 DIAGNOSIS — M9902 Segmental and somatic dysfunction of thoracic region: Secondary | ICD-10-CM | POA: Diagnosis not present

## 2019-10-16 ENCOUNTER — Encounter: Payer: Self-pay | Admitting: Physician Assistant

## 2019-10-16 ENCOUNTER — Other Ambulatory Visit: Payer: Self-pay

## 2019-10-16 ENCOUNTER — Ambulatory Visit (INDEPENDENT_AMBULATORY_CARE_PROVIDER_SITE_OTHER): Payer: Medicare Other | Admitting: Physician Assistant

## 2019-10-16 VITALS — BP 152/86 | HR 76 | Temp 97.0°F | Resp 16 | Wt 278.0 lb

## 2019-10-16 DIAGNOSIS — I4891 Unspecified atrial fibrillation: Secondary | ICD-10-CM | POA: Diagnosis not present

## 2019-10-16 DIAGNOSIS — I5042 Chronic combined systolic (congestive) and diastolic (congestive) heart failure: Secondary | ICD-10-CM | POA: Insufficient documentation

## 2019-10-16 DIAGNOSIS — Z0000 Encounter for general adult medical examination without abnormal findings: Secondary | ICD-10-CM | POA: Diagnosis not present

## 2019-10-16 DIAGNOSIS — E039 Hypothyroidism, unspecified: Secondary | ICD-10-CM

## 2019-10-16 DIAGNOSIS — E7800 Pure hypercholesterolemia, unspecified: Secondary | ICD-10-CM

## 2019-10-16 DIAGNOSIS — Z1239 Encounter for other screening for malignant neoplasm of breast: Secondary | ICD-10-CM

## 2019-10-16 DIAGNOSIS — I10 Essential (primary) hypertension: Secondary | ICD-10-CM | POA: Diagnosis not present

## 2019-10-16 DIAGNOSIS — E66813 Obesity, class 3: Secondary | ICD-10-CM

## 2019-10-16 MED ORDER — POTASSIUM CHLORIDE CRYS ER 20 MEQ PO TBCR: 20.0000 meq | EXTENDED_RELEASE_TABLET | Freq: Every day | ORAL | 1 refills | Status: DC

## 2019-10-16 MED ORDER — FUROSEMIDE 20 MG PO TABS: 20.0000 mg | ORAL_TABLET | Freq: Every day | ORAL | 1 refills | Status: DC

## 2019-10-16 NOTE — Progress Notes (Signed)
Complete physical exam   [name]   DOB: 03/04/1941   79 y.o. Female  MRN: [id number] Visit Date: 10/16/2019  Today's healthcare provider: Mar Daring, PA-C   Chief Complaint  Patient presents with  . Annual Exam   Subjective    Mary Kennedy is a 79 y.o. female who presents today for a complete physical exam.  She reports consuming a general diet. The patient does not participate in regular exercise at present. She generally feels fairly well. She reports sleeping fairly well. She does not have additional problems to discuss today.  HPI  Patient had AWV with [facility] on 07/02/2019 She reports that she has been going to the [facility] for her hip and she reports that her gait is a little off. Reports that she has a cardiology follow up appointment coming up. Newly found atrial fibrillation just before cataract surgery. One eye (left) has been completed, the other has not.   Past Medical History:  Diagnosis Date  . Allergy   . Cellulitis   . Heart murmur   . Hyperlipidemia   . Hypertension   . Hypothyroidism   . Thyroid disease   . Wears dentures    partial lower, full upper   Past Surgical History:  Procedure Laterality Date  . ABDOMINAL HYSTERECTOMY  1961   BIL Oophorectomy  . APPENDECTOMY  at age 14  . BILATERAL CARPAL TUNNEL RELEASE  1999  . CARDIAC CATHETERIZATION    . CATARACT EXTRACTION W/PHACO Left 09/01/2019   Procedure: CATARACT EXTRACTION PHACO AND INTRAOCULAR LENS PLACEMENT (IOC) LEFT 8.84 00:49.4;  Surgeon: Birder Robson, MD;  Location: [place];  Service: Ophthalmology;  Laterality: Left;  . COLONOSCOPY WITH PROPOFOL N/A 08/27/2017   Procedure: COLONOSCOPY WITH PROPOFOL;  Surgeon: Lucilla Lame, MD;  Location[facility];  Service: Endoscopy;  Laterality: N/A;  . FOOT SURGERY Bilateral   . TONSILLECTOMY  at age 26   Social History   Socioeconomic History  . Marital status: Divorced    Spouse name: Not on  file  . Number of children: 2  . Years of education: Not on file  . Highest education level: Some college, no degree  Occupational History  . Occupation: Retired    Comment: working part-time  Tobacco Use  . Smoking status: Former Smoker    Types: Cigarettes    Quit date: 05/13/1996    Years since quitting: 23.4  . Smokeless tobacco: Never Used  Substance and Sexual Activity  . Alcohol use: Yes    Alcohol/week: 0.0 - 2.0 standard drinks    Comment: occasionally   . Drug use: No  . Sexual activity: Not on file  Other Topics Concern  . Not on file  Social History Narrative  . Not on file   Social Determinants of Health   Financial Resource Strain: Low Risk   . Difficulty of Paying Living Expenses: Not hard at all  Food Insecurity: No Food Insecurity  . Worried About Charity fundraiser in the Last Year: Never true  . Ran Out of Food in the Last Year: Never true  Transportation Needs: No Transportation Needs  . Lack of Transportation (Medical): No  . Lack of Transportation (Non-Medical): No  Physical Activity: Inactive  . Days of Exercise per Week: 0 days  . Minutes of Exercise per Session: 0 min  Stress: Stress Concern Present  . Feeling of Stress : To some extent  Social Connections: Somewhat Isolated  .  Frequency of Communication with Friends and Family: More than three times a week  . Frequency of Social Gatherings with Friends and Family: Once a week  . Attends Religious Services: More than 4 times per year  . Active Member of Clubs or Organizations: No  . Attends Archivist Meetings: Never  . Marital Status: Divorced  Human resources officer Violence: Not At Risk  . Fear of Current or Ex-Partner: No  . Emotionally Abused: No  . Physically Abused: No  . Sexually Abused: No   Family Status  Relation Name Status  . Mother  Deceased at age 37       stomach problems  . Brother youngest Alive  . Father  Deceased at age 31       MI  . Brother oldest Deceased  at age Early 63's       LUNG CANCER  . Cousin  (Not Specified)   Family History  Problem Relation Age of Onset  . Pancreatitis Mother   . Drug abuse Brother        PTSD  . Cancer Brother        pancreas lesion  . Colon cancer Brother   . Heart attack Father   . Lung cancer Brother   . Cancer Brother        lung cancer  . Breast cancer Cousin        2nd cousin   Allergies  Allergen Reactions  . Tegaderm Ag Mesh 2"X2"  [Wound Dressings]     when removing the patch her skin came off.  . Ciprofloxacin Rash  . Latex Rash    Adhesive from covering for PICC line tore skin  . Lisinopril Cough  . Penicillins Rash    Patient Care Team: Virginia Crews, MD as PCP - General (Family Medicine) Birder Robson, MD as Referring Physician (Ophthalmology) Yolonda Kida, MD as Consulting Physician (Cardiology) Iris Pert, DC as Referring Physician (Chiropractic Medicine)   Medications: Outpatient Medications Prior to Visit  Medication Sig  . acetaminophen (TYLENOL) 650 MG CR tablet Take 650 mg by mouth every 8 (eight) hours as needed for pain.  Marland Kitchen aspirin EC 81 MG tablet Take 81 mg by mouth. Every 4 days  . atorvastatin (LIPITOR) 20 MG tablet TAKE 1 TABLET BY MOUTH AT  BEDTIME FOR CHOLESTEROL  . Biotin (BIOTIN 5000) 5 MG CAPS Take 10 mg by mouth.   . Calcium Carbonate-Vitamin D 600-200 MG-UNIT TABS Take 2 tablets by mouth daily.  . Cholecalciferol (VITAMIN D3) 50 MCG (2000 UT) capsule Take 2,000 Units by mouth daily.  . Chromium Picolinate 1000 MCG TABS Take 500 mcg by mouth daily.   . Coenzyme Q10 100 MG TABS Take 1 capsule by mouth daily.  . Cyanocobalamin (B-12) 2500 MCG TABS Take 2,500 mcg by mouth daily.  . Echinacea 450 MG CAPS Take 1 capsule by mouth daily.  . furosemide (LASIX) 20 MG tablet Take 1 tablet (20 mg total) by mouth daily.  . Glucos-Chond-Hyal Ac-Ca Fructo (MOVE FREE JOINT HEALTH ADVANCE) TABS Take 2 tablets by mouth daily.   . hydrochlorothiazide  (HYDRODIURIL) 12.5 MG tablet TAKE 1 TABLET BY MOUTH  DAILY  . Krill Oil 350 MG CAPS Take 1 tablet by mouth daily.  Marland Kitchen levothyroxine (SYNTHROID) 112 MCG tablet Take 1 tablet (112 mcg total) by mouth daily before breakfast.  . losartan (COZAAR) 100 MG tablet TAKE 1 TABLET BY MOUTH  DAILY  . Magnesium 250 MG TABS Take 1 tablet (250  mg total) by mouth daily. (Patient taking differently: Take 25 mg by mouth daily. )  . MULTIPLE VITAMIN PO Take 1 tablet by mouth daily.   . Multiple Vitamins-Minerals (ICAPS AREDS 2 PO) Take by mouth daily.  . naproxen sodium (ALEVE) 220 MG tablet Take 220 mg by mouth daily as needed.  . Omega-3 Fatty Acids (FISH OIL EXTRA STRENGTH) 1200 MG CAPS Take 1 capsule by mouth daily.  . potassium chloride SA (KLOR-CON) 20 MEQ tablet Take 1 tablet (20 mEq total) by mouth daily. With lasix  . simethicone (MYLICON) 025 MG chewable tablet Chew 125 mg by mouth daily.  . SUPER B COMPLEX/C PO Take 1 tablet by mouth. Twice a week  . triamcinolone cream (KENALOG) 0.1 % Apply 1 application topically 2 (two) times daily.  . Zinc 50 MG CAPS Take 50 mg by mouth daily.  Marland Kitchen albuterol (PROVENTIL HFA;VENTOLIN HFA) 108 (90 Base) MCG/ACT inhaler Inhale 2 puffs into the lungs every 6 (six) hours as needed for wheezing or shortness of breath. (Patient not taking: Reported on 08/25/2019)  . fluticasone (FLONASE) 50 MCG/ACT nasal spray Place 2 sprays into both nostrils daily. (Patient not taking: Reported on 08/25/2019)  . Tiotropium Bromide Monohydrate (SPIRIVA RESPIMAT) 2.5 MCG/ACT AERS Inhale 1 puff into the lungs daily. (Patient not taking: Reported on 08/25/2019)   No facility-administered medications prior to visit.    Review of Systems  Constitutional: Positive for activity change and fatigue.  HENT: Negative.   Eyes: Negative.   Respiratory: Negative.   Cardiovascular: Negative.   Gastrointestinal: Negative.   Endocrine: Positive for heat intolerance.  Genitourinary: Negative.     Musculoskeletal: Positive for arthralgias and myalgias.  Skin: Negative.   Allergic/Immunologic: Negative.   Neurological: Negative.   Hematological: Negative.   Psychiatric/Behavioral: Positive for agitation and sleep disturbance. The patient is nervous/anxious.     Last CBC Lab Results  Component Value Date   WBC 6.7 10/19/2019   HGB 13.4 10/19/2019   HCT 40.8 10/19/2019   MCV 97 10/19/2019   MCH 32.0 10/19/2019   RDW 13.0 10/19/2019   PLT 245 42/70/6237   Last metabolic panel Lab Results  Component Value Date   GLUCOSE 101 (H) 10/19/2019   NA 142 10/19/2019   K 4.8 10/19/2019   CL 104 10/19/2019   CO2 25 10/19/2019   BUN 20 10/19/2019   CREATININE 1.24 (H) 10/19/2019   GFRNONAA 42 (L) 10/19/2019   GFRAA 48 (L) 10/19/2019   CALCIUM 10.0 10/19/2019   PROT 6.7 10/19/2019   ALBUMIN 4.2 10/19/2019   LABGLOB 2.5 10/19/2019   AGRATIO 1.7 10/19/2019   BILITOT 0.6 10/19/2019   ALKPHOS 74 10/19/2019   AST 27 10/19/2019   ALT 29 10/19/2019   ANIONGAP 7 12/26/2017      Objective    BP (!) 152/86 (BP Location: Left Arm, Patient Position: Sitting, Cuff Size: Large)   Pulse 76   Temp (!) 97 F (36.1 C) (Temporal)   Resp 16   Wt 278 lb (126.1 kg)   BMI 44.87 kg/m  BP Readings from Last 3 Encounters:  10/16/19 (!) 152/86  09/01/19 (!) 149/90  08/05/19 (!) 145/76   Wt Readings from Last 3 Encounters:  10/16/19 278 lb (126.1 kg)  09/01/19 278 lb (126.1 kg)  08/05/19 274 lb (124.3 kg)      Physical Exam Vitals reviewed.  Constitutional:      General: She is not in acute distress.    Appearance: Normal appearance. She is  well-developed. She is obese. She is not ill-appearing or diaphoretic.  HENT:     Head: Normocephalic and atraumatic.     Right Ear: Tympanic membrane, ear canal and external ear normal.     Left Ear: Tympanic membrane, ear canal and external ear normal.     Nose: Nose normal.     Mouth/Throat:     Mouth: Mucous membranes are moist.      Pharynx: Oropharynx is clear. No oropharyngeal exudate or posterior oropharyngeal erythema.  Eyes:     General: No scleral icterus.       Right eye: No discharge.        Left eye: No discharge.     Extraocular Movements: Extraocular movements intact.     Conjunctiva/sclera: Conjunctivae normal.     Pupils: Pupils are equal, round, and reactive to light.  Neck:     Thyroid: No thyromegaly.     Vascular: No carotid bruit or JVD.     Trachea: No tracheal deviation.  Cardiovascular:     Rate and Rhythm: Normal rate. Rhythm irregularly irregular.     Pulses: Normal pulses.     Heart sounds: Murmur present. Systolic murmur present with a grade of 2/6. No friction rub. No gallop.   Pulmonary:     Effort: Pulmonary effort is normal. No respiratory distress.     Breath sounds: Normal breath sounds. No wheezing or rales.  Chest:     Chest wall: No tenderness.  Abdominal:     General: Bowel sounds are normal. There is no distension.     Palpations: Abdomen is soft. There is no mass.     Tenderness: There is no abdominal tenderness. There is no guarding or rebound.  Musculoskeletal:        General: No tenderness. Normal range of motion.     Cervical back: Normal range of motion and neck supple.     Right lower leg: Edema (1+ pitting) present.     Left lower leg: Edema (1+ pitting) present.  Lymphadenopathy:     Cervical: No cervical adenopathy.  Skin:    General: Skin is warm and dry.     Capillary Refill: Capillary refill takes less than 2 seconds.     Findings: No rash.  Neurological:     General: No focal deficit present.     Mental Status: She is alert and oriented to person, place, and time. Mental status is at baseline.  Psychiatric:        Mood and Affect: Mood normal.        Behavior: Behavior normal.        Thought Content: Thought content normal.        Judgment: Judgment normal.       Depression Screen  PHQ 2/9 Scores 10/16/2019 09/22/2018 06/30/2018  PHQ - 2 Score 2 0 2    PHQ- 9 Score 10 6 10     No results found for any visits on 10/16/19.  Assessment & Plan    Routine Health Maintenance and Physical Exam  Exercise Activities and Dietary recommendations Goals    . Exercise 3x per week (30 min per time)     Recommend some form of exercise 3 days a week for at least 30 minutes. Pt to start swimming and doing eccentrics for 3-4 days a week.   06/30/18: Continue trying to exercise 3 days a week for at least 30 minutes at a time. Pt to start riding her recumbent bike 3 times a week  for at least 30 minutes at a time.        Immunization History  Administered Date(s) Administered  . Influenza Split 02/19/2007, 02/27/2011  . Influenza, High Dose Seasonal PF 02/09/2014, 03/15/2016, 01/14/2019  . Influenza-Unspecified 03/04/2015, 03/05/2017, 02/10/2018  . PFIZER SARS-COV-2 Vaccination 06/18/2019, 07/09/2019  . Pneumococcal Conjugate-13 03/22/2014  . Pneumococcal Polysaccharide-23 11/03/2012  . Tdap 06/20/2010  . Zoster 06/20/2010    Health Maintenance  Topic Date Due  . INFLUENZA VACCINE  12/13/2019  . DEXA SCAN  06/20/2020  . TETANUS/TDAP  06/20/2020  . COVID-19 Vaccine  Completed  . PNA vac Low Risk Adult  Completed    Discussed health benefits of physical activity, and encouraged her to engage in regular exercise appropriate for her age and condition.  1. Annual physical exam Normal physical exam today with exception of still in atrial fibrillation but rate controlled. Continue ASA 81mg  currently until seen by Cardiology. Will check labs as below and f/u pending lab results. If labs are stable and WNL she will not need to have these rechecked for one year at her next annual physical exam. She is to call the office in the meantime if she has any acute issue, questions or concerns.  2. Adult hypothyroidism Stable. Continue Levothyroxine 142mcg. Will check labs as below and f/u pending results. - CBC with Differential/Platelet - Comprehensive  metabolic panel - TSH  3. Obesity, Class III, BMI 40-49.9 (morbid obesity) ([place]) Counseled patient on healthy lifestyle modifications including dieting and exercise.  - CBC with Differential/Platelet - Comprehensive metabolic panel - Hemoglobin A1c - Lipid panel  4. Essential hypertension Stable. Continue hctz 12.5mg , furosemide 20mg  prn, losartan 100mg . Will have appt coming up with cardiology. Will check labs as below and f/u pending results. - CBC with Differential/Platelet - Comprehensive metabolic panel - Hemoglobin A1c - Lipid panel  5. Pure hypercholesterolemia Stable. Continue Atorvastatin 20mg . Will check labs as below and f/u pending results. - CBC with Differential/Platelet - Comprehensive metabolic panel - Hemoglobin A1c - Lipid panel  6. Chronic combined systolic and diastolic heart failure (HCC) Continue furosemide with potassium. Will check labs as below and f/u pending results. - CBC with Differential/Platelet - Comprehensive metabolic panel - Hemoglobin A1c - Lipid panel - furosemide (LASIX) 20 MG tablet; Take 1 tablet (20 mg total) by mouth daily.  Dispense: 90 tablet; Refill: 1 - potassium chloride SA (KLOR-CON) 20 MEQ tablet; Take 1 tablet (20 mEq total) by mouth daily. With lasix  Dispense: 90 tablet; Refill: 1  7. Atrial fibrillation, unspecified type ([place]) New. Found in April 2021 prior to cataract surgery. Had started f/u with cardiology for work up, but then cancelled everything as she became frustrated. Now she is willing for evaluation and has called to reschedule cardiac evaluation.   8. Encounter for breast cancer screening using non-mammogram modality Breast exam today was normal. There is family history of breast cancer, remotely in a 2nd cousin, no immediate family. She does perform regular self breast exams. Mammogram was ordered as below. Information for [facility] Breast clinic was given to patient so she may schedule her mammogram at her  convenience. - MM 3D SCREEN BREAST BILATERAL; Future   No follow-ups on file.     Reynolds Bowl, PA-C, have reviewed all documentation for this visit. The documentation on 10/20/19 for the exam, diagnosis, procedures, and orders are all accurate and complete.   Rubye Beach  [facility] [phone] (phone) [phone] (fax)  [facility]

## 2019-10-16 NOTE — Patient Instructions (Addendum)
Health Maintenance After Age 78 After age 27, you are at a higher risk for certain long-term diseases and infections as well as injuries from falls. Falls are a major cause of broken bones and head injuries in people who are older than age 29. Getting regular preventive care can help to keep you healthy and well. Preventive care includes getting regular testing and making lifestyle changes as recommended by your health care provider. Talk with your health care provider about:  Which screenings and tests you should have. A screening is a test that checks for a disease when you have no symptoms.  A diet and exercise plan that is right for you. What should I know about screenings and tests to prevent falls? Screening and testing are the best ways to find a health problem early. Early diagnosis and treatment give you the best chance of managing medical conditions that are common after age 79. Certain conditions and lifestyle choices may make you more likely to have a fall. Your health care provider may recommend:  Regular vision checks. Poor vision and conditions such as cataracts can make you more likely to have a fall. If you wear glasses, make sure to get your prescription updated if your vision changes.  Medicine review. Work with your health care provider to regularly review all of the medicines you are taking, including over-the-counter medicines. Ask your health care provider about any side effects that may make you more likely to have a fall. Tell your health care provider if any medicines that you take make you feel dizzy or sleepy.  Osteoporosis screening. Osteoporosis is a condition that causes the bones to get weaker. This can make the bones weak and cause them to break more easily.  Blood pressure screening. Blood pressure changes and medicines to control blood pressure can make you feel dizzy.  Strength and balance checks. Your health care provider may recommend certain tests to check your  strength and balance while standing, walking, or changing positions.  Foot health exam. Foot pain and numbness, as well as not wearing proper footwear, can make you more likely to have a fall.  Depression screening. You may be more likely to have a fall if you have a fear of falling, feel emotionally low, or feel unable to do activities that you used to do.  Alcohol use screening. Using too much alcohol can affect your balance and may make you more likely to have a fall. What actions can I take to lower my risk of falls? General instructions  Talk with your health care provider about your risks for falling. Tell your health care provider if: ? You fall. Be sure to tell your health care provider about all falls, even ones that seem minor. ? You feel dizzy, sleepy, or off-balance.  Take over-the-counter and prescription medicines only as told by your health care provider. These include any supplements.  Eat a healthy diet and maintain a healthy weight. A healthy diet includes low-fat dairy products, low-fat (lean) meats, and fiber from whole grains, beans, and lots of fruits and vegetables. Home safety  Remove any tripping hazards, such as rugs, cords, and clutter.  Install safety equipment such as grab bars in bathrooms and safety rails on stairs.  Keep rooms and walkways well-lit. Activity   Follow a regular exercise program to stay fit. This will help you maintain your balance. Ask your health care provider what types of exercise are appropriate for you.  If you need a cane or  walker, use it as recommended by your health care provider.  Wear supportive shoes that have nonskid soles. Lifestyle  Do not drink alcohol if your health care provider tells you not to drink.  If you drink alcohol, limit how much you have: ? 0-1 drink a day for women. ? 0-2 drinks a day for men.  Be aware of how much alcohol is in your drink. In the [place] one drink equals one typical bottle of beer (12  oz), one-half glass of wine (5 oz), or one shot of hard liquor (1 oz).  Do not use any products that contain nicotine or tobacco, such as cigarettes and e-cigarettes. If you need help quitting, ask your health care provider. Summary  Having a healthy lifestyle and getting preventive care can help to protect your health and wellness after age 78.  Screening and testing are the best way to find a health problem early and help you avoid having a fall. Early diagnosis and treatment give you the best chance for managing medical conditions that are more common for people who are older than age 33.  Falls are a major cause of broken bones and head injuries in people who are older than age 40. Take precautions to prevent a fall at home.  Work with your health care provider to learn what changes you can make to improve your health and wellness and to prevent falls. This information is not intended to replace advice given to you by your health care provider. Make sure you discuss any questions you have with your health care provider. Document Revised: 08/21/2018 Document Reviewed: 03/13/2017 Elsevier Patient Education  [place].   Atrial Fibrillation  Atrial fibrillation is a type of irregular or rapid heartbeat (arrhythmia). In atrial fibrillation, the top part of the heart (atria) beats in an irregular pattern. This makes the heart unable to pump blood normally and effectively. The goal of treatment is to prevent blood clots from forming, control your heart rate, or restore your heartbeat to a normal rhythm. If this condition is not treated, it can cause serious problems, such as a weakened heart muscle (cardiomyopathy) or a stroke. What are the causes? This condition is often caused by medical conditions that damage the heart's electrical system. These include:  High blood pressure (hypertension). This is the most common cause.  Certain heart problems or conditions, such as heart  failure, coronary artery disease, heart valve problems, or heart surgery.  Diabetes.  Overactive thyroid (hyperthyroidism).  Obesity.  Chronic kidney disease. In some cases, the cause of this condition is not known. What increases the risk? This condition is more likely to develop in:  Older people.  People who smoke.  Athletes who do endurance exercise.  People who have a family history of atrial fibrillation.  Men.  People who use drugs.  People who drink a lot of alcohol.  People who have lung conditions, such as emphysema, pneumonia, or COPD.  People who have obstructive sleep apnea. What are the signs or symptoms? Symptoms of this condition include:  A feeling that your heart is racing or beating irregularly.  Discomfort or pain in your chest.  Shortness of breath.  Sudden light-headedness or weakness.  Tiring easily during exercise or activity.  Fatigue.  Syncope (fainting).  Sweating. In some cases, there are no symptoms. How is this diagnosed? Your health care provider may detect atrial fibrillation when taking your pulse. If detected, this condition may be diagnosed with:  An electrocardiogram (ECG)  to check electrical signals of the heart.  An ambulatory cardiac monitor to record your heart's activity for a few days.  A transthoracic echocardiogram (TTE) to create pictures of your heart.  A transesophageal echocardiogram (TEE) to create even closer pictures of your heart.  A stress test to check your blood supply while you exercise.  Imaging tests, such as a CT scan or chest X-ray.  Blood tests. How is this treated? Treatment depends on underlying conditions and how you feel when you experience atrial fibrillation. This condition may be treated with:  Medicines to prevent blood clots or to treat heart rate or heart rhythm problems.  Electrical cardioversion to reset the heart's rhythm.  A pacemaker to correct abnormal heart  rhythm.  Ablation to remove the heart tissue that sends abnormal signals.  Left atrial appendage closure to seal the area where blood clots can form. In some cases, underlying conditions will be treated. Follow these instructions at home: Medicines  Take over-the counter and prescription medicines only as told by your health care provider.  Do not take any new medicines without talking to your health care provider.  If you are taking blood thinners: ? Talk with your health care provider before you take any medicines that contain aspirin or NSAIDs, such as ibuprofen. These medicines increase your risk for dangerous bleeding. ? Take your medicine exactly as told, at the same time every day. ? Avoid activities that could cause injury or bruising, and follow instructions about how to prevent falls. ? Wear a medical alert bracelet or carry a card that lists what medicines you take. Lifestyle      Do not use any products that contain nicotine or tobacco, such as cigarettes, e-cigarettes, and chewing tobacco. If you need help quitting, ask your health care provider.  Eat heart-healthy foods. Talk with a dietitian to make an eating plan that is right for you.  Exercise regularly as told by your health care provider.  Do not drink alcohol.  Lose weight if you are overweight.  Do not use drugs, including cannabis. General instructions  If you have obstructive sleep apnea, manage your condition as told by your health care provider.  Do not use diet pills unless your health care provider approves. Diet pills can make heart problems worse.  Keep all follow-up visits as told by your health care provider. This is important. Contact a health care provider if you:  Notice a change in the rate, rhythm, or strength of your heartbeat.  Are taking a blood thinner and you notice more bruising.  Tire more easily when you exercise or do heavy work.  Have a sudden change in weight. Get help  right away if you have:   Chest pain, abdominal pain, sweating, or weakness.  Trouble breathing.  Side effects of blood thinners, such as blood in your vomit, stool, or urine, or bleeding that cannot stop.  Any symptoms of a stroke. "BE FAST" is an easy way to remember the main warning signs of a stroke: ? B - Balance. Signs are dizziness, sudden trouble walking, or loss of balance. ? E - Eyes. Signs are trouble seeing or a sudden change in vision. ? F - Face. Signs are sudden weakness or numbness of the face, or the face or eyelid drooping on one side. ? A - Arms. Signs are weakness or numbness in an arm. This happens suddenly and usually on one side of the body. ? S - Speech. Signs are sudden trouble  speaking, slurred speech, or trouble understanding what people say. ? T - Time. Time to call emergency services. Write down what time symptoms started.  Other signs of a stroke, such as: ? A sudden, severe headache with no known cause. ? Nausea or vomiting. ? Seizure. These symptoms may represent a serious problem that is an emergency. Do not wait to see if the symptoms will go away. Get medical help right away. Call your local emergency services (911 in the [place] Do not drive yourself to the hospital. Summary  Atrial fibrillation is a type of irregular or rapid heartbeat (arrhythmia).  Symptoms include a feeling that your heart is beating fast or irregularly.  You may be given medicines to prevent blood clots or to treat heart rate or heart rhythm problems.  Get help right away if you have signs or symptoms of a stroke.  Get help right away if you cannot catch your breath or have chest pain or pressure. This information is not intended to replace advice given to you by your health care provider. Make sure you discuss any questions you have with your health care provider. Document Revised: 10/22/2018 Document Reviewed: 10/22/2018 Elsevier Patient Education  [place].

## 2019-10-19 DIAGNOSIS — E039 Hypothyroidism, unspecified: Secondary | ICD-10-CM | POA: Diagnosis not present

## 2019-10-19 DIAGNOSIS — E7800 Pure hypercholesterolemia, unspecified: Secondary | ICD-10-CM | POA: Diagnosis not present

## 2019-10-19 DIAGNOSIS — M62830 Muscle spasm of back: Secondary | ICD-10-CM | POA: Diagnosis not present

## 2019-10-19 DIAGNOSIS — M5136 Other intervertebral disc degeneration, lumbar region: Secondary | ICD-10-CM | POA: Diagnosis not present

## 2019-10-19 DIAGNOSIS — I5042 Chronic combined systolic (congestive) and diastolic (congestive) heart failure: Secondary | ICD-10-CM | POA: Diagnosis not present

## 2019-10-19 DIAGNOSIS — I10 Essential (primary) hypertension: Secondary | ICD-10-CM | POA: Diagnosis not present

## 2019-10-19 DIAGNOSIS — M9902 Segmental and somatic dysfunction of thoracic region: Secondary | ICD-10-CM | POA: Diagnosis not present

## 2019-10-19 DIAGNOSIS — M9903 Segmental and somatic dysfunction of lumbar region: Secondary | ICD-10-CM | POA: Diagnosis not present

## 2019-10-20 ENCOUNTER — Telehealth: Payer: Self-pay

## 2019-10-20 ENCOUNTER — Other Ambulatory Visit: Payer: Self-pay

## 2019-10-20 DIAGNOSIS — I10 Essential (primary) hypertension: Secondary | ICD-10-CM

## 2019-10-20 LAB — CBC WITH DIFFERENTIAL/PLATELET
Basophils Absolute: 0.1 10*3/uL (ref 0.0–0.2)
Basos: 1 %
EOS (ABSOLUTE): 0.2 10*3/uL (ref 0.0–0.4)
Eos: 3 %
Hematocrit: 40.8 % (ref 34.0–46.6)
Hemoglobin: 13.4 g/dL (ref 11.1–15.9)
Immature Grans (Abs): 0 10*3/uL (ref 0.0–0.1)
Immature Granulocytes: 0 %
Lymphocytes Absolute: 2.8 10*3/uL (ref 0.7–3.1)
Lymphs: 41 %
MCH: 32 pg (ref 26.6–33.0)
MCHC: 32.8 g/dL (ref 31.5–35.7)
MCV: 97 fL (ref 79–97)
Monocytes Absolute: 0.7 10*3/uL (ref 0.1–0.9)
Monocytes: 10 %
Neutrophils Absolute: 3 10*3/uL (ref 1.4–7.0)
Neutrophils: 45 %
Platelets: 245 10*3/uL (ref 150–450)
RBC: 4.19 x10E6/uL (ref 3.77–5.28)
RDW: 13 % (ref 11.7–15.4)
WBC: 6.7 10*3/uL (ref 3.4–10.8)

## 2019-10-20 LAB — LIPID PANEL
Chol/HDL Ratio: 4 ratio (ref 0.0–4.4)
Cholesterol, Total: 163 mg/dL (ref 100–199)
HDL: 41 mg/dL (ref >39)
LDL Chol Calc (NIH): 102 mg/dL — ABNORMAL HIGH (ref 0–99)
Triglycerides: 108 mg/dL (ref 0–149)
VLDL Cholesterol Cal: 20 mg/dL (ref 5–40)

## 2019-10-20 LAB — COMPREHENSIVE METABOLIC PANEL
ALT: 29 IU/L (ref 0–32)
AST: 27 IU/L (ref 0–40)
Albumin/Globulin Ratio: 1.7 (ref 1.2–2.2)
Albumin: 4.2 g/dL (ref 3.7–4.7)
Alkaline Phosphatase: 74 IU/L (ref 48–121)
BUN/Creatinine Ratio: 16 (ref 12–28)
BUN: 20 mg/dL (ref 8–27)
Bilirubin Total: 0.6 mg/dL (ref 0.0–1.2)
CO2: 25 mmol/L (ref 20–29)
Calcium: 10 mg/dL (ref 8.7–10.3)
Chloride: 104 mmol/L (ref 96–106)
Creatinine, Ser: 1.24 mg/dL — ABNORMAL HIGH (ref 0.57–1.00)
GFR calc Af Amer: 48 mL/min/{1.73_m2} — ABNORMAL LOW (ref >59)
GFR calc non Af Amer: 42 mL/min/{1.73_m2} — ABNORMAL LOW (ref >59)
Globulin, Total: 2.5 g/dL (ref 1.5–4.5)
Glucose: 101 mg/dL — ABNORMAL HIGH (ref 65–99)
Potassium: 4.8 mmol/L (ref 3.5–5.2)
Sodium: 142 mmol/L (ref 134–144)
Total Protein: 6.7 g/dL (ref 6.0–8.5)

## 2019-10-20 LAB — HEMOGLOBIN A1C
Est. average glucose Bld gHb Est-mCnc: 111 mg/dL
Hgb A1c MFr Bld: 5.5 % (ref 4.8–5.6)

## 2019-10-20 LAB — TSH: TSH: 4.34 u[IU]/mL (ref 0.450–4.500)

## 2019-10-20 NOTE — Progress Notes (Signed)
Patient advised that lab has been ordered.

## 2019-10-20 NOTE — Telephone Encounter (Signed)
LMTCB if patient calls back OK for [facility] nurse to give results

## 2019-10-20 NOTE — Telephone Encounter (Signed)
-----   Message from Mar Daring, [place] sent at 10/20/2019 11:42 AM EDT ----- Blood count is normal. Kidney function has declined just slightly compared to last year. Make sure to push fluids and stay well hydrated. Sodium, potassium and calcium are normal. Liver enzymes are normal. A1c/sugar is normal and has improved from 6.0 to now 5.5! Cholesterol has improved from last year as well. Keep up the good work. Thyroid is normal. If desired can push fluids and we can recheck kidney function, non-fasting, in 2-3 weeks.

## 2019-10-20 NOTE — Telephone Encounter (Signed)
Pt given lab results per notes of Fenton Malling PA-C on 10/20/19. Pt verbalized understanding. Pt would like her kidney function checked as recommended by Fenton Malling PA-C. Please order and call pt after order is in. Pt aware no lab appt is needed.

## 2019-10-21 ENCOUNTER — Other Ambulatory Visit: Payer: Self-pay

## 2019-10-21 DIAGNOSIS — N289 Disorder of kidney and ureter, unspecified: Secondary | ICD-10-CM

## 2019-10-21 DIAGNOSIS — M62830 Muscle spasm of back: Secondary | ICD-10-CM | POA: Diagnosis not present

## 2019-10-21 DIAGNOSIS — M9902 Segmental and somatic dysfunction of thoracic region: Secondary | ICD-10-CM | POA: Diagnosis not present

## 2019-10-21 DIAGNOSIS — M5136 Other intervertebral disc degeneration, lumbar region: Secondary | ICD-10-CM | POA: Diagnosis not present

## 2019-10-21 DIAGNOSIS — M9903 Segmental and somatic dysfunction of lumbar region: Secondary | ICD-10-CM | POA: Diagnosis not present

## 2019-10-26 DIAGNOSIS — R0602 Shortness of breath: Secondary | ICD-10-CM | POA: Diagnosis not present

## 2019-10-26 DIAGNOSIS — I4891 Unspecified atrial fibrillation: Secondary | ICD-10-CM | POA: Diagnosis not present

## 2019-10-26 DIAGNOSIS — I4892 Unspecified atrial flutter: Secondary | ICD-10-CM | POA: Diagnosis not present

## 2019-10-27 DIAGNOSIS — M9902 Segmental and somatic dysfunction of thoracic region: Secondary | ICD-10-CM | POA: Diagnosis not present

## 2019-10-27 DIAGNOSIS — M9903 Segmental and somatic dysfunction of lumbar region: Secondary | ICD-10-CM | POA: Diagnosis not present

## 2019-10-27 DIAGNOSIS — M5136 Other intervertebral disc degeneration, lumbar region: Secondary | ICD-10-CM | POA: Diagnosis not present

## 2019-10-27 DIAGNOSIS — M62830 Muscle spasm of back: Secondary | ICD-10-CM | POA: Diagnosis not present

## 2019-10-30 DIAGNOSIS — M9903 Segmental and somatic dysfunction of lumbar region: Secondary | ICD-10-CM | POA: Diagnosis not present

## 2019-10-30 DIAGNOSIS — M9902 Segmental and somatic dysfunction of thoracic region: Secondary | ICD-10-CM | POA: Diagnosis not present

## 2019-10-30 DIAGNOSIS — M62830 Muscle spasm of back: Secondary | ICD-10-CM | POA: Diagnosis not present

## 2019-10-30 DIAGNOSIS — M5136 Other intervertebral disc degeneration, lumbar region: Secondary | ICD-10-CM | POA: Diagnosis not present

## 2019-11-02 DIAGNOSIS — E785 Hyperlipidemia, unspecified: Secondary | ICD-10-CM | POA: Diagnosis not present

## 2019-11-02 DIAGNOSIS — I4891 Unspecified atrial fibrillation: Secondary | ICD-10-CM | POA: Diagnosis not present

## 2019-11-02 DIAGNOSIS — R0602 Shortness of breath: Secondary | ICD-10-CM | POA: Diagnosis not present

## 2019-11-02 DIAGNOSIS — I340 Nonrheumatic mitral (valve) insufficiency: Secondary | ICD-10-CM | POA: Diagnosis not present

## 2019-11-02 DIAGNOSIS — R011 Cardiac murmur, unspecified: Secondary | ICD-10-CM | POA: Diagnosis not present

## 2019-11-04 DIAGNOSIS — M5136 Other intervertebral disc degeneration, lumbar region: Secondary | ICD-10-CM | POA: Diagnosis not present

## 2019-11-04 DIAGNOSIS — M9902 Segmental and somatic dysfunction of thoracic region: Secondary | ICD-10-CM | POA: Diagnosis not present

## 2019-11-04 DIAGNOSIS — M62830 Muscle spasm of back: Secondary | ICD-10-CM | POA: Diagnosis not present

## 2019-11-04 DIAGNOSIS — M9903 Segmental and somatic dysfunction of lumbar region: Secondary | ICD-10-CM | POA: Diagnosis not present

## 2019-11-10 DIAGNOSIS — M9903 Segmental and somatic dysfunction of lumbar region: Secondary | ICD-10-CM | POA: Diagnosis not present

## 2019-11-10 DIAGNOSIS — M62830 Muscle spasm of back: Secondary | ICD-10-CM | POA: Diagnosis not present

## 2019-11-10 DIAGNOSIS — M5136 Other intervertebral disc degeneration, lumbar region: Secondary | ICD-10-CM | POA: Diagnosis not present

## 2019-11-10 DIAGNOSIS — M9902 Segmental and somatic dysfunction of thoracic region: Secondary | ICD-10-CM | POA: Diagnosis not present

## 2019-11-18 DIAGNOSIS — M62830 Muscle spasm of back: Secondary | ICD-10-CM | POA: Diagnosis not present

## 2019-11-18 DIAGNOSIS — M9902 Segmental and somatic dysfunction of thoracic region: Secondary | ICD-10-CM | POA: Diagnosis not present

## 2019-11-18 DIAGNOSIS — M5136 Other intervertebral disc degeneration, lumbar region: Secondary | ICD-10-CM | POA: Diagnosis not present

## 2019-11-18 DIAGNOSIS — M9903 Segmental and somatic dysfunction of lumbar region: Secondary | ICD-10-CM | POA: Diagnosis not present

## 2019-11-25 DIAGNOSIS — M9903 Segmental and somatic dysfunction of lumbar region: Secondary | ICD-10-CM | POA: Diagnosis not present

## 2019-11-25 DIAGNOSIS — M9902 Segmental and somatic dysfunction of thoracic region: Secondary | ICD-10-CM | POA: Diagnosis not present

## 2019-11-25 DIAGNOSIS — M5136 Other intervertebral disc degeneration, lumbar region: Secondary | ICD-10-CM | POA: Diagnosis not present

## 2019-11-25 DIAGNOSIS — M62830 Muscle spasm of back: Secondary | ICD-10-CM | POA: Diagnosis not present

## 2019-12-02 DIAGNOSIS — M5136 Other intervertebral disc degeneration, lumbar region: Secondary | ICD-10-CM | POA: Diagnosis not present

## 2019-12-02 DIAGNOSIS — M62830 Muscle spasm of back: Secondary | ICD-10-CM | POA: Diagnosis not present

## 2019-12-02 DIAGNOSIS — M9903 Segmental and somatic dysfunction of lumbar region: Secondary | ICD-10-CM | POA: Diagnosis not present

## 2019-12-02 DIAGNOSIS — M9902 Segmental and somatic dysfunction of thoracic region: Secondary | ICD-10-CM | POA: Diagnosis not present

## 2019-12-03 DIAGNOSIS — I10 Essential (primary) hypertension: Secondary | ICD-10-CM | POA: Diagnosis not present

## 2019-12-04 LAB — RENAL FUNCTION PANEL
Albumin: 4.1 g/dL (ref 3.7–4.7)
BUN/Creatinine Ratio: 19 (ref 12–28)
BUN: 25 mg/dL (ref 8–27)
CO2: 24 mmol/L (ref 20–29)
Calcium: 10 mg/dL (ref 8.7–10.3)
Chloride: 102 mmol/L (ref 96–106)
Creatinine, Ser: 1.29 mg/dL — ABNORMAL HIGH (ref 0.57–1.00)
GFR calc Af Amer: 46 mL/min/{1.73_m2} — ABNORMAL LOW (ref >59)
GFR calc non Af Amer: 40 mL/min/{1.73_m2} — ABNORMAL LOW (ref >59)
Glucose: 102 mg/dL — ABNORMAL HIGH (ref 65–99)
Phosphorus: 4.2 mg/dL (ref 3.0–4.3)
Potassium: 4 mmol/L (ref 3.5–5.2)
Sodium: 141 mmol/L (ref 134–144)

## 2019-12-09 DIAGNOSIS — M5136 Other intervertebral disc degeneration, lumbar region: Secondary | ICD-10-CM | POA: Diagnosis not present

## 2019-12-09 DIAGNOSIS — M62830 Muscle spasm of back: Secondary | ICD-10-CM | POA: Diagnosis not present

## 2019-12-09 DIAGNOSIS — M9902 Segmental and somatic dysfunction of thoracic region: Secondary | ICD-10-CM | POA: Diagnosis not present

## 2019-12-09 DIAGNOSIS — M9903 Segmental and somatic dysfunction of lumbar region: Secondary | ICD-10-CM | POA: Diagnosis not present

## 2019-12-10 DIAGNOSIS — I509 Heart failure, unspecified: Secondary | ICD-10-CM | POA: Diagnosis not present

## 2019-12-10 DIAGNOSIS — H2511 Age-related nuclear cataract, right eye: Secondary | ICD-10-CM | POA: Diagnosis not present

## 2019-12-15 ENCOUNTER — Encounter: Payer: Self-pay | Admitting: Ophthalmology

## 2019-12-17 NOTE — Discharge Instructions (Signed)

## 2019-12-18 ENCOUNTER — Other Ambulatory Visit: Payer: Self-pay

## 2019-12-18 ENCOUNTER — Other Ambulatory Visit
Admission: RE | Admit: 2019-12-18 | Discharge: 2019-12-18 | Disposition: A | Discharge status: Home / Self Care | Payer: Medicare Other | Source: Ambulatory Visit | Attending: Ophthalmology | Admitting: Ophthalmology

## 2019-12-18 DIAGNOSIS — Z01812 Encounter for preprocedural laboratory examination: Secondary | ICD-10-CM | POA: Diagnosis not present

## 2019-12-18 DIAGNOSIS — Z20822 Contact with and (suspected) exposure to covid-19: Secondary | ICD-10-CM | POA: Diagnosis not present

## 2019-12-18 LAB — SARS CORONAVIRUS 2 (TAT 6-24 HRS): SARS Coronavirus 2: NEGATIVE

## 2019-12-22 ENCOUNTER — Ambulatory Visit: Payer: Medicare Other | Admitting: Anesthesiology

## 2019-12-22 ENCOUNTER — Ambulatory Visit
Admission: RE | Admit: 2019-12-22 | Discharge: 2019-12-22 | Disposition: A | Discharge status: Home / Self Care | Payer: Medicare Other | Attending: Ophthalmology | Admitting: Ophthalmology

## 2019-12-22 ENCOUNTER — Encounter: Payer: Self-pay | Admitting: Ophthalmology

## 2019-12-22 ENCOUNTER — Other Ambulatory Visit: Payer: Self-pay

## 2019-12-22 ENCOUNTER — Encounter
Admission: RE | Disposition: A | Discharge status: Home / Self Care | Payer: Self-pay | Source: Home / Self Care | Attending: Ophthalmology

## 2019-12-22 DIAGNOSIS — I4891 Unspecified atrial fibrillation: Secondary | ICD-10-CM | POA: Diagnosis not present

## 2019-12-22 DIAGNOSIS — I110 Hypertensive heart disease with heart failure: Secondary | ICD-10-CM | POA: Insufficient documentation

## 2019-12-22 DIAGNOSIS — I509 Heart failure, unspecified: Secondary | ICD-10-CM | POA: Insufficient documentation

## 2019-12-22 DIAGNOSIS — Z89429 Acquired absence of other toe(s), unspecified side: Secondary | ICD-10-CM | POA: Insufficient documentation

## 2019-12-22 DIAGNOSIS — Z6841 Body Mass Index (BMI) 40.0 and over, adult: Secondary | ICD-10-CM | POA: Insufficient documentation

## 2019-12-22 DIAGNOSIS — H2511 Age-related nuclear cataract, right eye: Secondary | ICD-10-CM | POA: Insufficient documentation

## 2019-12-22 DIAGNOSIS — Z7901 Long term (current) use of anticoagulants: Secondary | ICD-10-CM | POA: Diagnosis not present

## 2019-12-22 DIAGNOSIS — Z87891 Personal history of nicotine dependence: Secondary | ICD-10-CM | POA: Diagnosis not present

## 2019-12-22 DIAGNOSIS — Z79899 Other long term (current) drug therapy: Secondary | ICD-10-CM | POA: Diagnosis not present

## 2019-12-22 DIAGNOSIS — E039 Hypothyroidism, unspecified: Secondary | ICD-10-CM | POA: Diagnosis not present

## 2019-12-22 DIAGNOSIS — H25811 Combined forms of age-related cataract, right eye: Secondary | ICD-10-CM | POA: Diagnosis not present

## 2019-12-22 DIAGNOSIS — Z7982 Long term (current) use of aspirin: Secondary | ICD-10-CM | POA: Diagnosis not present

## 2019-12-22 HISTORY — PX: CATARACT EXTRACTION W/PHACO: SHX586

## 2019-12-22 SURGERY — PHACOEMULSIFICATION, CATARACT, WITH IOL INSERTION
Anesthesia: Monitor Anesthesia Care | Site: Eye | Laterality: Right

## 2019-12-22 MED ORDER — LIDOCAINE HCL (PF) 2 % IJ SOLN
INTRAOCULAR | Status: DC | PRN
  Administered 2019-12-22: 1 mL

## 2019-12-22 MED ORDER — TETRACAINE HCL 0.5 % OP SOLN
1.0000 [drp] | OPHTHALMIC | Status: DC | PRN
  Administered 2019-12-22 (×3): 1 [drp] via OPHTHALMIC

## 2019-12-22 MED ORDER — EPINEPHRINE PF 1 MG/ML IJ SOLN
INTRAOCULAR | Status: DC | PRN
  Administered 2019-12-22: 43 mL via OPHTHALMIC

## 2019-12-22 MED ORDER — BRIMONIDINE TARTRATE-TIMOLOL 0.2-0.5 % OP SOLN
OPHTHALMIC | Status: DC | PRN
  Administered 2019-12-22: 1 [drp] via OPHTHALMIC

## 2019-12-22 MED ORDER — MIDAZOLAM HCL 2 MG/2ML IJ SOLN
INTRAMUSCULAR | Status: DC | PRN
  Administered 2019-12-22: 1 mg via INTRAVENOUS

## 2019-12-22 MED ORDER — NA CHONDROIT SULF-NA HYALURON 40-17 MG/ML IO SOLN
INTRAOCULAR | Status: DC | PRN
  Administered 2019-12-22: 1 mL via INTRAOCULAR

## 2019-12-22 MED ORDER — ARMC OPHTHALMIC DILATING DROPS
1.0000 "application " | OPHTHALMIC | Status: DC | PRN
  Administered 2019-12-22 (×3): 1 via OPHTHALMIC

## 2019-12-22 MED ORDER — MOXIFLOXACIN HCL 0.5 % OP SOLN
OPHTHALMIC | Status: DC | PRN
  Administered 2019-12-22: 0.2 mL via OPHTHALMIC

## 2019-12-22 MED ORDER — POLYMYXIN B-TRIMETHOPRIM 10000-0.1 UNIT/ML-% OP SOLN
OPHTHALMIC | Status: DC | PRN
  Administered 2019-12-22: 1 [drp] via OPHTHALMIC

## 2019-12-22 MED ORDER — FENTANYL CITRATE (PF) 100 MCG/2ML IJ SOLN
INTRAMUSCULAR | Status: DC | PRN
  Administered 2019-12-22: 50 ug via INTRAVENOUS

## 2019-12-22 SURGICAL SUPPLY — 17 items
CANNULA ANT/CHMB 27GA (MISCELLANEOUS) ×6 IMPLANT
GLOVE SURG LX 8.0 MICRO (GLOVE) ×2
GLOVE SURG LX STRL 8.0 MICRO (GLOVE) ×1 IMPLANT
GLOVE SURG TRIUMPH 8.0 PF LTX (GLOVE) ×3 IMPLANT
GOWN STRL REUS W/ TWL LRG LVL3 (GOWN DISPOSABLE) ×2 IMPLANT
GOWN STRL REUS W/TWL LRG LVL3 (GOWN DISPOSABLE) ×6
LENS IOL DIOP 24.0 (Intraocular Lens) ×3 IMPLANT
LENS IOL TECNIS MONO 24.0 (Intraocular Lens) ×1 IMPLANT
MARKER SKIN DUAL TIP RULER LAB (MISCELLANEOUS) ×3 IMPLANT
NEEDLE FILTER BLUNT 18X 1/2SAF (NEEDLE) ×2
NEEDLE FILTER BLUNT 18X1 1/2 (NEEDLE) ×1 IMPLANT
PACK OPTHALMIC (MISCELLANEOUS) ×3 IMPLANT
PACK PORFILIO (MISCELLANEOUS) ×3 IMPLANT
SYR 3ML LL SCALE MARK (SYRINGE) ×3 IMPLANT
SYR TB 1ML LUER SLIP (SYRINGE) ×3 IMPLANT
WATER STERILE IRR 250ML POUR (IV SOLUTION) ×3 IMPLANT
WIPE NON LINTING 3.25X3.25 (MISCELLANEOUS) ×3 IMPLANT

## 2019-12-22 NOTE — Anesthesia Preprocedure Evaluation (Signed)
Anesthesia Evaluation  Patient identified by MRN, date of birth, ID band Patient awake    History of Anesthesia Complications Negative for: history of anesthetic complications  Airway Mallampati: IV  TM Distance: >3 FB Neck ROM: Full   Comment: Small mouth Dental  (+) Upper Dentures, Partial Lower   Pulmonary sleep apnea (likely per notes) , former smoker,    Pulmonary exam normal        Cardiovascular Exercise Tolerance: Poor hypertension, Pt. on medications Normal cardiovascular exam+ dysrhythmias (on coumadin) Atrial Fibrillation   10/26/19 Nuclear stress   Normal myocardial perfusion scan no evidence of stress-induced   myocardial ischemia ejection fraction of 59% conclusion negative scan   Neuro/Psych    GI/Hepatic   Endo/Other  Hypothyroidism Morbid obesity (BMI 45)  Renal/GU      Musculoskeletal   Abdominal   Peds  Hematology   Anesthesia Other Findings   Reproductive/Obstetrics                             Anesthesia Physical Anesthesia Plan  ASA: III  Anesthesia Plan: MAC   Post-op Pain Management:    Induction: Intravenous  PONV Risk Score and Plan: 2 and Midazolam, TIVA and Treatment may vary due to age or medical condition  Airway Management Planned: Nasal Cannula and Natural Airway  Additional Equipment: None  Intra-op Plan:   Post-operative Plan:   Informed Consent: I have reviewed the patients History and Physical, chart, labs and discussed the procedure including the risks, benefits and alternatives for the proposed anesthesia with the patient or authorized representative who has indicated his/her understanding and acceptance.       Plan Discussed with: CRNA  Anesthesia Plan Comments:         Anesthesia Quick Evaluation

## 2019-12-22 NOTE — Transfer of Care (Signed)
Immediate Anesthesia Transfer of Care Note  [name]  Procedure(s) Performed: CATARACT EXTRACTION PHACO AND INTRAOCULAR LENS PLACEMENT (IOC) RIGHT 6.77 00:42.9 (Right Eye)  Patient Location: PACU  Anesthesia Type: MAC  Level of Consciousness: awake, alert  and patient cooperative  Airway and Oxygen Therapy: Patient Spontanous Breathing and Patient connected to supplemental oxygen  Post-op Assessment: Post-op Vital signs reviewed, Patient's Cardiovascular Status Stable, Respiratory Function Stable, Patent Airway and No signs of Nausea or vomiting  Post-op Vital Signs: Reviewed and stable  Complications: No complications documented.

## 2019-12-22 NOTE — Anesthesia Postprocedure Evaluation (Signed)
Anesthesia Post Note  [name]  Procedure(s) Performed: CATARACT EXTRACTION PHACO AND INTRAOCULAR LENS PLACEMENT (IOC) RIGHT 6.77 00:42.9 (Right Eye)     Patient location during evaluation: PACU Anesthesia Type: MAC Level of consciousness: awake and alert Pain management: pain level controlled Vital Signs Assessment: post-procedure vital signs reviewed and stable Respiratory status: spontaneous breathing Cardiovascular status: blood pressure returned to baseline Postop Assessment: no apparent nausea or vomiting, adequate PO intake and no headache Anesthetic complications: no   No complications documented.  Adele Barthel Muhammad Vacca

## 2019-12-22 NOTE — H&P (Signed)
All labs reviewed. Abnormal studies sent to patients PCP when indicated.  Previous H&P reviewed, patient examined, there are NO CHANGES.  Mary Kennedy Porfilio8/10/20217:58 AM

## 2019-12-22 NOTE — Anesthesia Procedure Notes (Signed)
Procedure Name: MAC Date/Time: 12/22/2019 8:07 AM Performed by: Cameron Ali, CRNA Pre-anesthesia Checklist: Patient identified, Emergency Drugs available, Suction available, Timeout performed and Patient being monitored Patient Re-evaluated:Patient Re-evaluated prior to induction Oxygen Delivery Method: Nasal cannula Placement Confirmation: positive ETCO2

## 2019-12-22 NOTE — Op Note (Signed)
PREOPERATIVE DIAGNOSIS:  Nuclear sclerotic cataract of the right eye.   POSTOPERATIVE DIAGNOSIS:  H25.11 Cataract   OPERATIVE PROCEDURE:@   SURGEON:  Birder Robson, MD.   ANESTHESIA:  Anesthesiologist: Page, Adele Barthel, MD CRNA: Cameron Ali, CRNA  1.      Managed anesthesia care. 2.      0.41ml of Shugarcaine was instilled in the eye following the paracentesis.   COMPLICATIONS:  None.   TECHNIQUE:   Stop and chop   DESCRIPTION OF PROCEDURE:  The patient was examined and consented in the preoperative holding area where the aforementioned topical anesthesia was applied to the right eye and then brought back to the Operating Room where the right eye was prepped and draped in the usual sterile ophthalmic fashion and a lid speculum was placed. A paracentesis was created with the side port blade and the anterior chamber was filled with viscoelastic. A near clear corneal incision was performed with the steel keratome. A continuous curvilinear capsulorrhexis was performed with a cystotome followed by the capsulorrhexis forceps. Hydrodissection and hydrodelineation were carried out with BSS on a blunt cannula. The lens was removed in a stop and chop  technique and the remaining cortical material was removed with the irrigation-aspiration handpiece. The capsular bag was inflated with viscoelastic and the Technis [id number]  lens was placed in the capsular bag without complication. The remaining viscoelastic was removed from the eye with the irrigation-aspiration handpiece. The wounds were hydrated. The anterior chamber was flushed with BSS and the eye was inflated to physiologic pressure. 0.30ml of Vigamox was placed in the anterior chamber. The wounds were found to be water tight. The eye was dressed with Combigan. The patient was given protective glasses to wear throughout the day and a shield with which to sleep tonight. The patient was also given drops with which to begin a drop regimen today and will  follow-up with me in one day. Implant Name Type Inv. Item Serial No. Manufacturer Lot No. LRB No. Used Action  LENS IOL DIOP 24.0 - [id number] Intraocular Lens LENS IOL DIOP 24.0 [id number] [employer]  Right 1 Implanted   Procedure(s): CATARACT EXTRACTION PHACO AND INTRAOCULAR LENS PLACEMENT (IOC) RIGHT 6.77 00:42.9 (Right)  Electronically signed: Birder Robson 12/22/2019 8:24 AM

## 2020-01-03 ENCOUNTER — Other Ambulatory Visit: Payer: Self-pay | Admitting: Physician Assistant

## 2020-01-03 DIAGNOSIS — E7800 Pure hypercholesterolemia, unspecified: Secondary | ICD-10-CM

## 2020-01-03 DIAGNOSIS — I10 Essential (primary) hypertension: Secondary | ICD-10-CM

## 2020-01-16 ENCOUNTER — Other Ambulatory Visit: Payer: Self-pay | Admitting: Physician Assistant

## 2020-01-16 DIAGNOSIS — E039 Hypothyroidism, unspecified: Secondary | ICD-10-CM

## 2020-02-01 DIAGNOSIS — Z961 Presence of intraocular lens: Secondary | ICD-10-CM | POA: Diagnosis not present

## 2020-02-05 ENCOUNTER — Ambulatory Visit (INDEPENDENT_AMBULATORY_CARE_PROVIDER_SITE_OTHER): Payer: Medicare Other | Admitting: Vascular Surgery

## 2020-03-02 ENCOUNTER — Ambulatory Visit: Payer: Medicare Other | Admitting: Physician Assistant

## 2020-03-02 ENCOUNTER — Ambulatory Visit: Payer: Medicare Other

## 2020-03-18 ENCOUNTER — Other Ambulatory Visit: Payer: Self-pay

## 2020-03-18 ENCOUNTER — Ambulatory Visit (INDEPENDENT_AMBULATORY_CARE_PROVIDER_SITE_OTHER): Payer: Medicare Other | Admitting: Physician Assistant

## 2020-03-18 DIAGNOSIS — Z23 Encounter for immunization: Secondary | ICD-10-CM

## 2020-03-18 NOTE — Progress Notes (Signed)
Patient presents today for immunization only. Administered Shingrix in the L deltoid IM. Patient tolerated well with no adverse reactions. Patient was observed in office for 108mins. Appt for next injection scheduled.

## 2020-03-21 DIAGNOSIS — M9902 Segmental and somatic dysfunction of thoracic region: Secondary | ICD-10-CM | POA: Diagnosis not present

## 2020-03-21 DIAGNOSIS — M62830 Muscle spasm of back: Secondary | ICD-10-CM | POA: Diagnosis not present

## 2020-03-21 DIAGNOSIS — M9903 Segmental and somatic dysfunction of lumbar region: Secondary | ICD-10-CM | POA: Diagnosis not present

## 2020-03-21 DIAGNOSIS — M5136 Other intervertebral disc degeneration, lumbar region: Secondary | ICD-10-CM | POA: Diagnosis not present

## 2020-03-30 DIAGNOSIS — M9903 Segmental and somatic dysfunction of lumbar region: Secondary | ICD-10-CM | POA: Diagnosis not present

## 2020-03-30 DIAGNOSIS — M62830 Muscle spasm of back: Secondary | ICD-10-CM | POA: Diagnosis not present

## 2020-03-30 DIAGNOSIS — M5136 Other intervertebral disc degeneration, lumbar region: Secondary | ICD-10-CM | POA: Diagnosis not present

## 2020-03-30 DIAGNOSIS — M9902 Segmental and somatic dysfunction of thoracic region: Secondary | ICD-10-CM | POA: Diagnosis not present

## 2020-04-04 DIAGNOSIS — M9902 Segmental and somatic dysfunction of thoracic region: Secondary | ICD-10-CM | POA: Diagnosis not present

## 2020-04-04 DIAGNOSIS — M9903 Segmental and somatic dysfunction of lumbar region: Secondary | ICD-10-CM | POA: Diagnosis not present

## 2020-04-04 DIAGNOSIS — M5136 Other intervertebral disc degeneration, lumbar region: Secondary | ICD-10-CM | POA: Diagnosis not present

## 2020-04-04 DIAGNOSIS — M62830 Muscle spasm of back: Secondary | ICD-10-CM | POA: Diagnosis not present

## 2020-04-13 ENCOUNTER — Ambulatory Visit
Admission: RE | Admit: 2020-04-13 | Discharge: 2020-04-13 | Disposition: A | Discharge status: Home / Self Care | Payer: Medicare Other | Source: Ambulatory Visit | Attending: Physician Assistant | Admitting: Physician Assistant

## 2020-04-13 ENCOUNTER — Other Ambulatory Visit: Payer: Self-pay

## 2020-04-13 DIAGNOSIS — M9903 Segmental and somatic dysfunction of lumbar region: Secondary | ICD-10-CM | POA: Diagnosis not present

## 2020-04-13 DIAGNOSIS — M5136 Other intervertebral disc degeneration, lumbar region: Secondary | ICD-10-CM | POA: Diagnosis not present

## 2020-04-13 DIAGNOSIS — Z1231 Encounter for screening mammogram for malignant neoplasm of breast: Secondary | ICD-10-CM | POA: Insufficient documentation

## 2020-04-13 DIAGNOSIS — M9902 Segmental and somatic dysfunction of thoracic region: Secondary | ICD-10-CM | POA: Diagnosis not present

## 2020-04-13 DIAGNOSIS — M62830 Muscle spasm of back: Secondary | ICD-10-CM | POA: Diagnosis not present

## 2020-04-13 DIAGNOSIS — Z1239 Encounter for other screening for malignant neoplasm of breast: Secondary | ICD-10-CM

## 2020-04-15 ENCOUNTER — Telehealth: Payer: Self-pay

## 2020-04-15 NOTE — Telephone Encounter (Signed)
Copied from [place] [phone]. Topic: General - Other >> Apr 15, 2020  3:58 PM Alanda Slim E wrote: Reason for CRM: Pt had her flu shot done in Sept at [facility] / please advise

## 2020-04-18 DIAGNOSIS — M5136 Other intervertebral disc degeneration, lumbar region: Secondary | ICD-10-CM | POA: Diagnosis not present

## 2020-04-18 DIAGNOSIS — M9902 Segmental and somatic dysfunction of thoracic region: Secondary | ICD-10-CM | POA: Diagnosis not present

## 2020-04-18 DIAGNOSIS — M62830 Muscle spasm of back: Secondary | ICD-10-CM | POA: Diagnosis not present

## 2020-04-18 DIAGNOSIS — M9903 Segmental and somatic dysfunction of lumbar region: Secondary | ICD-10-CM | POA: Diagnosis not present

## 2020-04-21 ENCOUNTER — Other Ambulatory Visit: Payer: Self-pay | Admitting: Physician Assistant

## 2020-04-21 DIAGNOSIS — M5136 Other intervertebral disc degeneration, lumbar region: Secondary | ICD-10-CM | POA: Diagnosis not present

## 2020-04-21 DIAGNOSIS — L309 Dermatitis, unspecified: Secondary | ICD-10-CM

## 2020-04-21 DIAGNOSIS — M9902 Segmental and somatic dysfunction of thoracic region: Secondary | ICD-10-CM | POA: Diagnosis not present

## 2020-04-21 DIAGNOSIS — M9903 Segmental and somatic dysfunction of lumbar region: Secondary | ICD-10-CM | POA: Diagnosis not present

## 2020-04-21 DIAGNOSIS — M62830 Muscle spasm of back: Secondary | ICD-10-CM | POA: Diagnosis not present

## 2020-04-21 NOTE — Telephone Encounter (Signed)
   Notes to clinic: Script has expired  Review for refill    Requested Prescriptions  Pending Prescriptions Disp Refills   triamcinolone (KENALOG) 0.1 % [Pharmacy Med Name: TRIAMCINOLONE  0.1%  CRE] 80 g 1    Sig: APPLY TOPICALLY TWO TIMES  DAILY      Dermatology:  Corticosteroids Passed - 04/21/2020 12:52 PM      Passed - Valid encounter within last 12 months    Recent Outpatient Visits           6 months ago Annual physical exam   [place], [place]   9 months ago Venous insufficiency   [facility] Fenton Malling M, [place]   10 months ago [place], Clearnce Sorrel, [place]   1 year ago Bradycardia   [place], Clearnce Sorrel, [place]   1 year ago Annual physical exam   [place], Clearnce Sorrel, [place]       Future Appointments             In 4 weeks Marlyn Corporal, Clearnce Sorrel, PA-C [employer], [place]

## 2020-04-25 DIAGNOSIS — M9903 Segmental and somatic dysfunction of lumbar region: Secondary | ICD-10-CM | POA: Diagnosis not present

## 2020-04-25 DIAGNOSIS — M62830 Muscle spasm of back: Secondary | ICD-10-CM | POA: Diagnosis not present

## 2020-04-25 DIAGNOSIS — M5136 Other intervertebral disc degeneration, lumbar region: Secondary | ICD-10-CM | POA: Diagnosis not present

## 2020-04-25 DIAGNOSIS — M9902 Segmental and somatic dysfunction of thoracic region: Secondary | ICD-10-CM | POA: Diagnosis not present

## 2020-04-28 DIAGNOSIS — M62830 Muscle spasm of back: Secondary | ICD-10-CM | POA: Diagnosis not present

## 2020-04-28 DIAGNOSIS — M5136 Other intervertebral disc degeneration, lumbar region: Secondary | ICD-10-CM | POA: Diagnosis not present

## 2020-04-28 DIAGNOSIS — M9902 Segmental and somatic dysfunction of thoracic region: Secondary | ICD-10-CM | POA: Diagnosis not present

## 2020-04-28 DIAGNOSIS — M9903 Segmental and somatic dysfunction of lumbar region: Secondary | ICD-10-CM | POA: Diagnosis not present

## 2020-05-02 DIAGNOSIS — M9902 Segmental and somatic dysfunction of thoracic region: Secondary | ICD-10-CM | POA: Diagnosis not present

## 2020-05-02 DIAGNOSIS — M62830 Muscle spasm of back: Secondary | ICD-10-CM | POA: Diagnosis not present

## 2020-05-02 DIAGNOSIS — M5136 Other intervertebral disc degeneration, lumbar region: Secondary | ICD-10-CM | POA: Diagnosis not present

## 2020-05-02 DIAGNOSIS — M9903 Segmental and somatic dysfunction of lumbar region: Secondary | ICD-10-CM | POA: Diagnosis not present

## 2020-05-05 DIAGNOSIS — M5136 Other intervertebral disc degeneration, lumbar region: Secondary | ICD-10-CM | POA: Diagnosis not present

## 2020-05-05 DIAGNOSIS — M9902 Segmental and somatic dysfunction of thoracic region: Secondary | ICD-10-CM | POA: Diagnosis not present

## 2020-05-05 DIAGNOSIS — M9903 Segmental and somatic dysfunction of lumbar region: Secondary | ICD-10-CM | POA: Diagnosis not present

## 2020-05-05 DIAGNOSIS — M62830 Muscle spasm of back: Secondary | ICD-10-CM | POA: Diagnosis not present

## 2020-05-12 DIAGNOSIS — M62830 Muscle spasm of back: Secondary | ICD-10-CM | POA: Diagnosis not present

## 2020-05-12 DIAGNOSIS — M5136 Other intervertebral disc degeneration, lumbar region: Secondary | ICD-10-CM | POA: Diagnosis not present

## 2020-05-12 DIAGNOSIS — M9903 Segmental and somatic dysfunction of lumbar region: Secondary | ICD-10-CM | POA: Diagnosis not present

## 2020-05-12 DIAGNOSIS — M9902 Segmental and somatic dysfunction of thoracic region: Secondary | ICD-10-CM | POA: Diagnosis not present

## 2020-05-18 DIAGNOSIS — M9902 Segmental and somatic dysfunction of thoracic region: Secondary | ICD-10-CM | POA: Diagnosis not present

## 2020-05-18 DIAGNOSIS — M62830 Muscle spasm of back: Secondary | ICD-10-CM | POA: Diagnosis not present

## 2020-05-18 DIAGNOSIS — M5136 Other intervertebral disc degeneration, lumbar region: Secondary | ICD-10-CM | POA: Diagnosis not present

## 2020-05-18 DIAGNOSIS — M9903 Segmental and somatic dysfunction of lumbar region: Secondary | ICD-10-CM | POA: Diagnosis not present

## 2020-05-20 ENCOUNTER — Ambulatory Visit: Payer: Self-pay | Admitting: Physician Assistant

## 2020-05-20 ENCOUNTER — Ambulatory Visit: Payer: Medicare Other | Admitting: Physician Assistant

## 2020-05-20 ENCOUNTER — Ambulatory Visit: Payer: Medicare Other | Admitting: Family Medicine

## 2020-05-25 DIAGNOSIS — M62830 Muscle spasm of back: Secondary | ICD-10-CM | POA: Diagnosis not present

## 2020-05-25 DIAGNOSIS — M9903 Segmental and somatic dysfunction of lumbar region: Secondary | ICD-10-CM | POA: Diagnosis not present

## 2020-05-25 DIAGNOSIS — M5136 Other intervertebral disc degeneration, lumbar region: Secondary | ICD-10-CM | POA: Diagnosis not present

## 2020-05-25 DIAGNOSIS — M9902 Segmental and somatic dysfunction of thoracic region: Secondary | ICD-10-CM | POA: Diagnosis not present

## 2020-06-01 DIAGNOSIS — M9902 Segmental and somatic dysfunction of thoracic region: Secondary | ICD-10-CM | POA: Diagnosis not present

## 2020-06-01 DIAGNOSIS — M9903 Segmental and somatic dysfunction of lumbar region: Secondary | ICD-10-CM | POA: Diagnosis not present

## 2020-06-01 DIAGNOSIS — M5136 Other intervertebral disc degeneration, lumbar region: Secondary | ICD-10-CM | POA: Diagnosis not present

## 2020-06-01 DIAGNOSIS — M62830 Muscle spasm of back: Secondary | ICD-10-CM | POA: Diagnosis not present

## 2020-06-08 DIAGNOSIS — M9902 Segmental and somatic dysfunction of thoracic region: Secondary | ICD-10-CM | POA: Diagnosis not present

## 2020-06-08 DIAGNOSIS — M62830 Muscle spasm of back: Secondary | ICD-10-CM | POA: Diagnosis not present

## 2020-06-08 DIAGNOSIS — M9903 Segmental and somatic dysfunction of lumbar region: Secondary | ICD-10-CM | POA: Diagnosis not present

## 2020-06-08 DIAGNOSIS — M5136 Other intervertebral disc degeneration, lumbar region: Secondary | ICD-10-CM | POA: Diagnosis not present

## 2020-06-21 ENCOUNTER — Other Ambulatory Visit: Payer: Self-pay | Admitting: Physician Assistant

## 2020-06-21 DIAGNOSIS — I10 Essential (primary) hypertension: Secondary | ICD-10-CM

## 2020-06-21 DIAGNOSIS — E7800 Pure hypercholesterolemia, unspecified: Secondary | ICD-10-CM

## 2020-06-23 DIAGNOSIS — M62830 Muscle spasm of back: Secondary | ICD-10-CM | POA: Diagnosis not present

## 2020-06-23 DIAGNOSIS — M9903 Segmental and somatic dysfunction of lumbar region: Secondary | ICD-10-CM | POA: Diagnosis not present

## 2020-06-23 DIAGNOSIS — M5136 Other intervertebral disc degeneration, lumbar region: Secondary | ICD-10-CM | POA: Diagnosis not present

## 2020-06-23 DIAGNOSIS — M9902 Segmental and somatic dysfunction of thoracic region: Secondary | ICD-10-CM | POA: Diagnosis not present

## 2020-06-28 ENCOUNTER — Telehealth: Payer: Self-pay | Admitting: Family Medicine

## 2020-06-28 NOTE — Telephone Encounter (Signed)
Copied from [place] [phone]. Topic: Medicare AWV >> Jun 28, 2020  9:53 AM Cher Nakai R wrote: Reason for CRM:  Left message for patient to call back and schedule Medicare Annual Wellness Visit (AWV) in office.   If not able to come in office, please offer to do virtually or by telephone.   Last AWV 07/02/2019  Please schedule at anytime with [facility] Health Advisor.  If any questions, please contact me at [phone]

## 2020-06-29 DIAGNOSIS — M5136 Other intervertebral disc degeneration, lumbar region: Secondary | ICD-10-CM | POA: Diagnosis not present

## 2020-06-29 DIAGNOSIS — M9903 Segmental and somatic dysfunction of lumbar region: Secondary | ICD-10-CM | POA: Diagnosis not present

## 2020-06-29 DIAGNOSIS — M62830 Muscle spasm of back: Secondary | ICD-10-CM | POA: Diagnosis not present

## 2020-06-29 DIAGNOSIS — M9902 Segmental and somatic dysfunction of thoracic region: Secondary | ICD-10-CM | POA: Diagnosis not present

## 2020-07-06 DIAGNOSIS — M62830 Muscle spasm of back: Secondary | ICD-10-CM | POA: Diagnosis not present

## 2020-07-06 DIAGNOSIS — M5136 Other intervertebral disc degeneration, lumbar region: Secondary | ICD-10-CM | POA: Diagnosis not present

## 2020-07-06 DIAGNOSIS — M9903 Segmental and somatic dysfunction of lumbar region: Secondary | ICD-10-CM | POA: Diagnosis not present

## 2020-07-06 DIAGNOSIS — M9902 Segmental and somatic dysfunction of thoracic region: Secondary | ICD-10-CM | POA: Diagnosis not present

## 2020-07-14 DIAGNOSIS — M5136 Other intervertebral disc degeneration, lumbar region: Secondary | ICD-10-CM | POA: Diagnosis not present

## 2020-07-14 DIAGNOSIS — M9902 Segmental and somatic dysfunction of thoracic region: Secondary | ICD-10-CM | POA: Diagnosis not present

## 2020-07-14 DIAGNOSIS — M62830 Muscle spasm of back: Secondary | ICD-10-CM | POA: Diagnosis not present

## 2020-07-14 DIAGNOSIS — M9903 Segmental and somatic dysfunction of lumbar region: Secondary | ICD-10-CM | POA: Diagnosis not present

## 2020-07-20 DIAGNOSIS — M9902 Segmental and somatic dysfunction of thoracic region: Secondary | ICD-10-CM | POA: Diagnosis not present

## 2020-07-20 DIAGNOSIS — M5136 Other intervertebral disc degeneration, lumbar region: Secondary | ICD-10-CM | POA: Diagnosis not present

## 2020-07-20 DIAGNOSIS — M9903 Segmental and somatic dysfunction of lumbar region: Secondary | ICD-10-CM | POA: Diagnosis not present

## 2020-07-20 DIAGNOSIS — M62830 Muscle spasm of back: Secondary | ICD-10-CM | POA: Diagnosis not present

## 2020-07-27 ENCOUNTER — Telehealth: Payer: Self-pay

## 2020-07-27 DIAGNOSIS — I10 Essential (primary) hypertension: Secondary | ICD-10-CM

## 2020-07-27 NOTE — Telephone Encounter (Signed)
[facility] faxed refill request for the following medications: ° °losartan (COZAAR) 100 MG tablet  ° °Please advise. ° °

## 2020-07-28 MED ORDER — LOSARTAN POTASSIUM 100 MG PO TABS: 100.0000 mg | ORAL_TABLET | Freq: Every day | ORAL | 2 refills | Status: DC

## 2020-08-03 ENCOUNTER — Other Ambulatory Visit: Payer: Self-pay | Admitting: Physician Assistant

## 2020-08-03 DIAGNOSIS — M62830 Muscle spasm of back: Secondary | ICD-10-CM | POA: Diagnosis not present

## 2020-08-03 DIAGNOSIS — M5136 Other intervertebral disc degeneration, lumbar region: Secondary | ICD-10-CM | POA: Diagnosis not present

## 2020-08-03 DIAGNOSIS — M9902 Segmental and somatic dysfunction of thoracic region: Secondary | ICD-10-CM | POA: Diagnosis not present

## 2020-08-03 DIAGNOSIS — I5042 Chronic combined systolic (congestive) and diastolic (congestive) heart failure: Secondary | ICD-10-CM

## 2020-08-03 DIAGNOSIS — M9903 Segmental and somatic dysfunction of lumbar region: Secondary | ICD-10-CM | POA: Diagnosis not present

## 2020-08-03 NOTE — Telephone Encounter (Signed)
Requested medication (s) are due for refill today: yes  Requested medication (s) are on the active medication list: yes  Last refill: 06/02/2020  Future visit scheduled: no  Notes to clinic:  overdue for 6 month follow up  Message has been sent to patient to contact office    Requested Prescriptions  Pending Prescriptions Disp Refills   furosemide (LASIX) 20 MG tablet [Pharmacy Med Name: Furosemide 20 MG Oral Tablet] 90 tablet 3    Sig: TAKE 1 TABLET BY MOUTH  DAILY      Cardiovascular:  Diuretics - Loop Failed - 08/03/2020  5:27 AM      Failed - Cr in normal range and within 360 days    Creat  Date Value Ref Range Status  04/10/2017 1.02 (H) 0.60 - 0.93 mg/dL Final    Comment:    For patients >24 years of age, the reference limit for Creatinine is approximately 13% higher for people identified as African-American. .    Creatinine, Ser  Date Value Ref Range Status  12/03/2019 1.29 (H) 0.57 - 1.00 mg/dL Final          Failed - Valid encounter within last 6 months    Recent Outpatient Visits           9 months ago Annual physical exam   [facility] [place], Clearnce Sorrel, [place]   1 year ago Venous insufficiency   [place], Clearnce Sorrel, [place]   1 year ago [place], [place], [place]   1 year ago Bradycardia   [facility] [place], [place]   2 years ago Annual physical exam   [facility] [place], [place]                Passed - K in normal range and within 360 days    Potassium  Date Value Ref Range Status  12/03/2019 4.0 3.5 - 5.2 mmol/L Final  06/04/2014 4.0 3.5 - 5.1 mmol/L Final          Passed - Ca in normal range and within 360 days    Calcium  Date Value Ref Range Status  12/03/2019 10.0 8.7 - 10.3 mg/dL Final   Calcium, Total  Date Value Ref Range Status  06/04/2014 9.2 8.5 - 10.1 mg/dL Final          Passed - Na in  normal range and within 360 days    Sodium  Date Value Ref Range Status  12/03/2019 141 134 - 144 mmol/L Final  06/04/2014 144 136 - 145 mmol/L Final          Passed - Last BP in normal range    BP Readings from Last 1 Encounters:  12/22/19 127/86            potassium chloride SA (KLOR-CON) 20 MEQ tablet [Pharmacy Med Name: Potassium Chloride Crys ER 20 MEQ Oral Tablet Extended Release] 90 tablet 3    Sig: TAKE 1 TABLET BY MOUTH  DAILY WITH LASIX      Endocrinology:  Minerals - Potassium Supplementation Failed - 08/03/2020  5:27 AM      Failed - Cr in normal range and within 360 days    Creat  Date Value Ref Range Status  04/10/2017 1.02 (H) 0.60 - 0.93 mg/dL Final    Comment:    For patients >28 years of age, the reference limit for Creatinine is approximately 13% higher for people  identified as African-American. .    Creatinine, Ser  Date Value Ref Range Status  12/03/2019 1.29 (H) 0.57 - 1.00 mg/dL Final          Passed - K in normal range and within 360 days    Potassium  Date Value Ref Range Status  12/03/2019 4.0 3.5 - 5.2 mmol/L Final  06/04/2014 4.0 3.5 - 5.1 mmol/L Final          Passed - Valid encounter within last 12 months    Recent Outpatient Visits           9 months ago Annual physical exam   [place], Clearnce Sorrel, [place]   1 year ago Venous insufficiency   [place], Clearnce Sorrel, [place]   1 year ago [place], Clearnce Sorrel, [place]   1 year ago Bradycardia   [place], Clearnce Sorrel, [place]   2 years ago Annual physical exam   [facility] [place], [place], [place]

## 2020-08-04 ENCOUNTER — Other Ambulatory Visit: Payer: Self-pay | Admitting: Family Medicine

## 2020-08-04 DIAGNOSIS — E7800 Pure hypercholesterolemia, unspecified: Secondary | ICD-10-CM

## 2020-08-04 NOTE — Telephone Encounter (Signed)
Still Jenni's patient while she is with the practice.

## 2020-08-15 ENCOUNTER — Telehealth: Payer: Self-pay | Admitting: Family Medicine

## 2020-08-15 NOTE — Telephone Encounter (Signed)
Patient said she wanted to see Dr. B but was unsure if Dr. B wanted to see her after their last encounter. Apparently patient refused ambulatory advice during her last OV. Pt scheduled with Dr. Jacinto Reap

## 2020-08-15 NOTE — Telephone Encounter (Signed)
Patient called to schedule a physical appt. With doctor, but she does not want to see dr. Brita Romp.  She would like to know if she can be assigned to someone else.  Please advise and call patient to discuss further at [phone]

## 2020-08-15 NOTE — Telephone Encounter (Signed)
I called patient and we scheduled her for an appt in May.

## 2020-08-17 DIAGNOSIS — M5136 Other intervertebral disc degeneration, lumbar region: Secondary | ICD-10-CM | POA: Diagnosis not present

## 2020-08-17 DIAGNOSIS — M9903 Segmental and somatic dysfunction of lumbar region: Secondary | ICD-10-CM | POA: Diagnosis not present

## 2020-08-17 DIAGNOSIS — M62830 Muscle spasm of back: Secondary | ICD-10-CM | POA: Diagnosis not present

## 2020-08-17 DIAGNOSIS — M9902 Segmental and somatic dysfunction of thoracic region: Secondary | ICD-10-CM | POA: Diagnosis not present

## 2020-08-31 DIAGNOSIS — M9903 Segmental and somatic dysfunction of lumbar region: Secondary | ICD-10-CM | POA: Diagnosis not present

## 2020-08-31 DIAGNOSIS — M9902 Segmental and somatic dysfunction of thoracic region: Secondary | ICD-10-CM | POA: Diagnosis not present

## 2020-08-31 DIAGNOSIS — M62830 Muscle spasm of back: Secondary | ICD-10-CM | POA: Diagnosis not present

## 2020-08-31 DIAGNOSIS — M5136 Other intervertebral disc degeneration, lumbar region: Secondary | ICD-10-CM | POA: Diagnosis not present

## 2020-09-14 ENCOUNTER — Ambulatory Visit: Payer: Self-pay | Admitting: *Deleted

## 2020-09-14 ENCOUNTER — Telehealth: Payer: Self-pay | Admitting: Family Medicine

## 2020-09-14 DIAGNOSIS — M62830 Muscle spasm of back: Secondary | ICD-10-CM | POA: Diagnosis not present

## 2020-09-14 DIAGNOSIS — M5136 Other intervertebral disc degeneration, lumbar region: Secondary | ICD-10-CM | POA: Diagnosis not present

## 2020-09-14 DIAGNOSIS — M9903 Segmental and somatic dysfunction of lumbar region: Secondary | ICD-10-CM | POA: Diagnosis not present

## 2020-09-14 DIAGNOSIS — M9902 Segmental and somatic dysfunction of thoracic region: Secondary | ICD-10-CM | POA: Diagnosis not present

## 2020-09-14 NOTE — Telephone Encounter (Signed)
Marissa from [facility] called to report some symptoms, the patient has SOB and low oxygen readings. Please advise (transferred to triage).

## 2020-09-14 NOTE — Telephone Encounter (Signed)
Call received from [place], [place] from [facility] and reported patient having "bad night'. RN reports she reviewed symptoms with patient via phone and reports O sat readings in the 70-80's and pulse in the 113 range this am. RN reported patient denies distress and difficulty breathing but does report shortness of breath at time. Called patient to triage and assess symptoms. Patient reports she had difficulty sleeping and upon awakening B/P 151/80 pulse 113. Reassess B/P this pm with NT at 5:28 pm for B/P 116/65 pulse 102 pulse and O 2 sat greater than 90 % on RA. Patient reports heart rate fluctuates. Hx A-fib and patient reports she is not taking her coumadin. She just does not want to take. Patient denies chest pain, difficulty breathing, fever. Patient does not wear oxygen. Patient c/o pain in legs , knees and hips after working but reports she can walk without difficulty or shortness of breath. Patient denies any distress at this time. instructed patient to monitor symptoms and call back if O2 sats less then 90% on RA or any palpitations noted. Patient verbalized understanding of care advise and to call back or go to [facility] or ED if symptoms worsen. appt scheduled for 5/9 /22.  Answer Assessment - Initial Assessment Questions 1. MAIN CONCERN OR SYMPTOM: "What's your main concern?" (e.g., low oxygen level, breathing difficulty) "What question do you have?"     [place] health care RN, Mable Fill called to report patient with low oxygen levels during call.  2. ONSET: "When did the  na  start?"      na 3. OXYGEN THERAPY:    - "Do you currently use home oxygen?"    - If Yes, ask: "What is your oxygen source?" (e.g., O2 tank, O2 concentrator).    - If Yes, ask: "How do you get the oxygen?" (e.g., nasal prongs, face mask).    - If Yes, ask: "How much oxygen are you supposed to use?" (e.g., 1-2 L [place])     no 4.  OXYGEN EQUIPMENT:  "Are you having any trouble with your oxygen equipment?"  (e.g., cannula, mask,  tubing, tank, concentrator)     na 5. PULSE OXIMETER:    - "Do you have a pulse oximeter (pulse ox)?"     - If Yes, ask: "Where do you place the probe?" (e.g., fingertip, ear lobe)     Yes. Fingertip  6. O2 MONITORING: "What is the oxygen level (pulse ox reading)?" (e.g., 70-1      Above 90%  /call center). Document CURRENT and NORMAL BASELINE values if available.     -  P: "What is your pulse rate per minute?" ranging from 78 bpm-102   -  RR: "What is your respiratory rate per minute?"     na 8. BREATHING DIFFICULTY: "Are you having any difficulty breathing?" If Yes, ask: "How bad is it?"  (e.g., none, mild, moderate, severe)    - MILD: No SOB at rest, mild SOB with walking, speaks normally in sentences, able to lie down, no retractions, pulse < 100.    - MODERATE: SOB at rest, SOB with minimal exertion and prefers to sit, cannot lie down flat, speaks in phrases, mild retractions, audible wheezing, pulse 100-120.    - SEVERE: Very SOB at rest, speaks in single words, struggling to breathe, sitting hunched forward, retractions, pulse > 120      Mild but can not lay flat  9. OTHER SYMPTOMS: "Do you have any other symptoms?" (e.g., fever, change  in sputum)     C/o legs, knees and hip pain 10. SMOKING: "Do you smoke currently?" "Is there anyone that smokes around you?"  (Note: smoking around oxygen is dangerous!)       na  Protocols used: OXYGEN MONITORING AND HYPOXIA-A-AH

## 2020-09-15 NOTE — Telephone Encounter (Signed)
Patient was advised by triage nurse to go to Urgent Care or ER. Dr. B aware.

## 2020-09-15 NOTE — Telephone Encounter (Signed)
Noted  

## 2020-09-19 ENCOUNTER — Ambulatory Visit (INDEPENDENT_AMBULATORY_CARE_PROVIDER_SITE_OTHER): Payer: Medicare Other | Admitting: Family Medicine

## 2020-09-19 ENCOUNTER — Encounter: Payer: Self-pay | Admitting: Family Medicine

## 2020-09-19 ENCOUNTER — Other Ambulatory Visit: Payer: Self-pay

## 2020-09-19 VITALS — BP 99/65 | HR 96 | Temp 98.9°F | Resp 16 | Ht 66.0 in | Wt 279.8 lb

## 2020-09-19 DIAGNOSIS — N1832 Chronic kidney disease, stage 3b: Secondary | ICD-10-CM | POA: Insufficient documentation

## 2020-09-19 DIAGNOSIS — E7800 Pure hypercholesterolemia, unspecified: Secondary | ICD-10-CM | POA: Diagnosis not present

## 2020-09-19 DIAGNOSIS — N183 Chronic kidney disease, stage 3 unspecified: Secondary | ICD-10-CM | POA: Diagnosis not present

## 2020-09-19 DIAGNOSIS — E039 Hypothyroidism, unspecified: Secondary | ICD-10-CM

## 2020-09-19 DIAGNOSIS — I4891 Unspecified atrial fibrillation: Secondary | ICD-10-CM

## 2020-09-19 DIAGNOSIS — I129 Hypertensive chronic kidney disease with stage 1 through stage 4 chronic kidney disease, or unspecified chronic kidney disease: Secondary | ICD-10-CM | POA: Diagnosis not present

## 2020-09-19 DIAGNOSIS — I5042 Chronic combined systolic (congestive) and diastolic (congestive) heart failure: Secondary | ICD-10-CM

## 2020-09-19 NOTE — Patient Instructions (Signed)
Hypertension, Adult High blood pressure (hypertension) is when the force of blood pumping through the arteries is too strong. The arteries are the blood vessels that carry blood from the heart throughout the body. Hypertension forces the heart to work harder to pump blood and may cause arteries to become narrow or stiff. Untreated or uncontrolled hypertension can cause a heart attack, heart failure, a stroke, kidney disease, and other problems. A blood pressure reading consists of a higher number over a lower number. Ideally, your blood pressure should be below 120/80. The first ("top") number is called the systolic pressure. It is a measure of the pressure in your arteries as your heart beats. The second ("bottom") number is called the diastolic pressure. It is a measure of the pressure in your arteries as the heart relaxes. What are the causes? The exact cause of this condition is not known. There are some conditions that result in or are related to high blood pressure. What increases the risk? Some risk factors for high blood pressure are under your control. The following factors may make you more likely to develop this condition:  Smoking.  Having type 2 diabetes mellitus, high cholesterol, or both.  Not getting enough exercise or physical activity.  Being overweight.  Having too much fat, sugar, calories, or salt (sodium) in your diet.  Drinking too much alcohol. Some risk factors for high blood pressure may be difficult or impossible to change. Some of these factors include:  Having chronic kidney disease.  Having a family history of high blood pressure.  Age. Risk increases with age.  Race. You may be at higher risk if you are African American.  Gender. Men are at higher risk than women before age 45. After age 65, women are at higher risk than men.  Having obstructive sleep apnea.  Stress. What are the signs or symptoms? High blood pressure may not cause symptoms. Very high  blood pressure (hypertensive crisis) may cause:  Headache.  Anxiety.  Shortness of breath.  Nosebleed.  Nausea and vomiting.  Vision changes.  Severe chest pain.  Seizures. How is this diagnosed? This condition is diagnosed by measuring your blood pressure while you are seated, with your arm resting on a flat surface, your legs uncrossed, and your feet flat on the floor. The cuff of the blood pressure monitor will be placed directly against the skin of your upper arm at the level of your heart. It should be measured at least twice using the same arm. Certain conditions can cause a difference in blood pressure between your right and left arms. Certain factors can cause blood pressure readings to be lower or higher than normal for a short period of time:  When your blood pressure is higher when you are in a health care provider's office than when you are at home, this is called white coat hypertension. Most people with this condition do not need medicines.  When your blood pressure is higher at home than when you are in a health care provider's office, this is called masked hypertension. Most people with this condition may need medicines to control blood pressure. If you have a high blood pressure reading during one visit or you have normal blood pressure with other risk factors, you may be asked to:  Return on a different day to have your blood pressure checked again.  Monitor your blood pressure at home for 1 week or longer. If you are diagnosed with hypertension, you may have other blood or   imaging tests to help your health care provider understand your overall risk for other conditions. How is this treated? This condition is treated by making healthy lifestyle changes, such as eating healthy foods, exercising more, and reducing your alcohol intake. Your health care provider may prescribe medicine if lifestyle changes are not enough to get your blood pressure under control, and  if:  Your systolic blood pressure is above 130.  Your diastolic blood pressure is above 80. Your personal target blood pressure may vary depending on your medical conditions, your age, and other factors. Follow these instructions at home: Eating and drinking  Eat a diet that is high in fiber and potassium, and low in sodium, added sugar, and fat. An example eating plan is called the DASH (Dietary Approaches to Stop Hypertension) diet. To eat this way: ? Eat plenty of fresh fruits and vegetables. Try to fill one half of your plate at each meal with fruits and vegetables. ? Eat whole grains, such as whole-wheat pasta, brown rice, or whole-grain bread. Fill about one fourth of your plate with whole grains. ? Eat or drink low-fat dairy products, such as skim milk or low-fat yogurt. ? Avoid fatty cuts of meat, processed or cured meats, and poultry with skin. Fill about one fourth of your plate with lean proteins, such as fish, chicken without skin, beans, eggs, or tofu. ? Avoid pre-made and processed foods. These tend to be higher in sodium, added sugar, and fat.  Reduce your daily sodium intake. Most people with hypertension should eat less than 1,500 mg of sodium a day.  Do not drink alcohol if: ? Your health care provider tells you not to drink. ? You are pregnant, may be pregnant, or are planning to become pregnant.  If you drink alcohol: ? Limit how much you use to:  0-1 drink a day for women.  0-2 drinks a day for men. ? Be aware of how much alcohol is in your drink. In the [place] one drink equals one 12 oz bottle of beer (355 mL), one 5 oz glass of wine (148 mL), or one 1 oz glass of hard liquor (44 mL).   Lifestyle  Work with your health care provider to maintain a healthy body weight or to lose weight. Ask what an ideal weight is for you.  Get at least 30 minutes of exercise most days of the week. Activities may include walking, swimming, or biking.  Include exercise to  strengthen your muscles (resistance exercise), such as Pilates or lifting weights, as part of your weekly exercise routine. Try to do these types of exercises for 30 minutes at least 3 days a week.  Do not use any products that contain nicotine or tobacco, such as cigarettes, e-cigarettes, and chewing tobacco. If you need help quitting, ask your health care provider.  Monitor your blood pressure at home as told by your health care provider.  Keep all follow-up visits as told by your health care provider. This is important.   Medicines  Take over-the-counter and prescription medicines only as told by your health care provider. Follow directions carefully. Blood pressure medicines must be taken as prescribed.  Do not skip doses of blood pressure medicine. Doing this puts you at risk for problems and can make the medicine less effective.  Ask your health care provider about side effects or reactions to medicines that you should watch for. Contact a health care provider if you:  Think you are having a reaction to a   medicine you are taking.  Have headaches that keep coming back (recurring).  Feel dizzy.  Have swelling in your ankles.  Have trouble with your vision. Get help right away if you:  Develop a severe headache or confusion.  Have unusual weakness or numbness.  Feel faint.  Have severe pain in your chest or abdomen.  Vomit repeatedly.  Have trouble breathing. Summary  Hypertension is when the force of blood pumping through your arteries is too strong. If this condition is not controlled, it may put you at risk for serious complications.  Your personal target blood pressure may vary depending on your medical conditions, your age, and other factors. For most people, a normal blood pressure is less than 120/80.  Hypertension is treated with lifestyle changes, medicines, or a combination of both. Lifestyle changes include losing weight, eating a healthy, low-sodium diet,  exercising more, and limiting alcohol. This information is not intended to replace advice given to you by your health care provider. Make sure you discuss any questions you have with your health care provider. Document Revised: 01/08/2018 Document Reviewed: 01/08/2018 Elsevier Patient Education  2021 Elsevier Inc.  High Cholesterol  High cholesterol is a condition in which the blood has high levels of a white, waxy substance similar to fat (cholesterol). The liver makes all the cholesterol that the body needs. The human body needs small amounts of cholesterol to help build cells. A person gets extra or excess cholesterol from the food that he or she eats. The blood carries cholesterol from the liver to the rest of the body. If you have high cholesterol, deposits (plaques) may build up on the walls of your arteries. Arteries are the blood vessels that carry blood away from your heart. These plaques make the arteries narrow and stiff. Cholesterol plaques increase your risk for heart attack and stroke. Work with your health care provider to keep your cholesterol levels in a healthy range. What increases the risk? The following factors may make you more likely to develop this condition:  Eating foods that are high in animal fat (saturated fat) or cholesterol.  Being overweight.  Not getting enough exercise.  A family history of high cholesterol (familial hypercholesterolemia).  Use of tobacco products.  Having diabetes. What are the signs or symptoms? There are no symptoms of this condition. How is this diagnosed? This condition may be diagnosed based on the results of a blood test.  If you are older than 79 years of age, your health care provider may check your cholesterol levels every 4-6 years.  You may be checked more often if you have high cholesterol or other risk factors for heart disease. The blood test for cholesterol measures:  "Bad" cholesterol, or LDL cholesterol. This is  the main type of cholesterol that causes heart disease. The desired level is less than 100 mg/dL.  "Good" cholesterol, or HDL cholesterol. HDL helps protect against heart disease by cleaning the arteries and carrying the LDL to the liver for processing. The desired level for HDL is 60 mg/dL or higher.  Triglycerides. These are fats that your body can store or burn for energy. The desired level is less than 150 mg/dL.  Total cholesterol. This measures the total amount of cholesterol in your blood and includes LDL, HDL, and triglycerides. The desired level is less than 200 mg/dL. How is this treated? This condition may be treated with:  Diet changes. You may be asked to eat foods that have more fiber and less saturated   fats or added sugar.  Lifestyle changes. These may include regular exercise, maintaining a healthy weight, and quitting use of tobacco products.  Medicines. These are given when diet and lifestyle changes have not worked. You may be prescribed a statin medicine to help lower your cholesterol levels. Follow these instructions at home: Eating and drinking  Eat a healthy, balanced diet. This diet includes: ? Daily servings of a variety of fresh, frozen, or canned fruits and vegetables. ? Daily servings of whole grain foods that are rich in fiber. ? Foods that are low in saturated fats and trans fats. These include poultry and fish without skin, lean cuts of meat, and low-fat dairy products. ? A variety of fish, especially oily fish that contain omega-3 fatty acids. Aim to eat fish at least 2 times a week.  Avoid foods and drinks that have added sugar.  Use healthy cooking methods, such as roasting, grilling, broiling, baking, poaching, steaming, and stir-frying. Do not fry your food except for stir-frying.   Lifestyle  Get regular exercise. Aim to exercise for a total of 150 minutes a week. Increase your activity level by doing activities such as gardening, walking, and taking  the stairs.  Do not use any products that contain nicotine or tobacco, such as cigarettes, e-cigarettes, and chewing tobacco. If you need help quitting, ask your health care provider.   General instructions  Take over-the-counter and prescription medicines only as told by your health care provider.  Keep all follow-up visits as told by your health care provider. This is important. Where to find more information  American Heart Association: www.heart.org  National Heart, Lung, and Blood Institute: www.nhlbi.nih.gov Contact a health care provider if:  You have trouble achieving or maintaining a healthy diet or weight.  You are starting an exercise program.  You are unable to stop smoking. Get help right away if:  You have chest pain.  You have trouble breathing.  You have any symptoms of a stroke. "BE FAST" is an easy way to remember the main warning signs of a stroke: ? B - Balance. Signs are dizziness, sudden trouble walking, or loss of balance. ? E - Eyes. Signs are trouble seeing or a sudden change in vision. ? F - Face. Signs are sudden weakness or numbness of the face, or the face or eyelid drooping on one side. ? A - Arms. Signs are weakness or numbness in an arm. This happens suddenly and usually on one side of the body. ? S - Speech. Signs are sudden trouble speaking, slurred speech, or trouble understanding what people say. ? T - Time. Time to call emergency services. Write down what time symptoms started.  You have other signs of a stroke, such as: ? A sudden, severe headache with no known cause. ? Nausea or vomiting. ? Seizure. These symptoms may represent a serious problem that is an emergency. Do not wait to see if the symptoms will go away. Get medical help right away. Call your local emergency services (911 in the [place] Do not drive yourself to the hospital. Summary  Cholesterol plaques increase your risk for heart attack and stroke. Work with your health care  provider to keep your cholesterol levels in a healthy range.  Eat a healthy, balanced diet, get regular exercise, and maintain a healthy weight.  Do not use any products that contain nicotine or tobacco, such as cigarettes, e-cigarettes, and chewing tobacco.  Get help right away if you have any symptoms of a   stroke. This information is not intended to replace advice given to you by your health care provider. Make sure you discuss any questions you have with your health care provider. Document Revised: 03/30/2019 Document Reviewed: 03/30/2019 Elsevier Patient Education  2021 Elsevier Inc.  

## 2020-09-19 NOTE — Assessment & Plan Note (Signed)
Followed by Cardiology [facility] today States she is not taking coumadin at this time

## 2020-09-19 NOTE — Assessment & Plan Note (Signed)
Followed by cardiology Euvolemic today No changes to meds

## 2020-09-19 NOTE — Assessment & Plan Note (Signed)
Previously well controlled Continue statin and CoQ10 Repeat FLP and CMP

## 2020-09-19 NOTE — Assessment & Plan Note (Signed)
Well controlled Continue current medications Recheck metabolic panel F/u in 6 months  

## 2020-09-19 NOTE — Progress Notes (Signed)
Established patient visit   [name]   DOB: 1940/06/11   80 y.o. Female  MRN: [id number] Visit Date: 09/19/2020  Today's healthcare provider: Lavon Paganini, MD   Chief Complaint  Patient presents with  . Hypothyroidism  . Hypertension  . Hyperlipidemia   Subjective    Hypertension Associated symptoms include shortness of breath. Pertinent negatives include no chest pain, headaches or palpitations.  Hyperlipidemia Associated symptoms include myalgias and shortness of breath. Pertinent negatives include no chest pain.    Hypothyroid, follow-up  Lab Results  Component Value Date   TSH 4.340 10/19/2019   TSH 1.050 06/23/2018   TSH 4.460 07/11/2017   Wt Readings from Last 3 Encounters:  09/19/20 279 lb 12.8 oz (126.9 kg)  12/22/19 277 lb (125.6 kg)  10/16/19 278 lb (126.1 kg)    She was last seen for hypothyroid 6 months ago.  Management since that visit includes No changes. She reports excellent compliance with treatment. She is not having side effects.  Symptoms: Yes change in energy level Yes constipation  Yes diarrhea No heat / cold intolerance  Yes nervousness No palpitations  No weight changes    ----------------------------------------------------------------------------------------- Hypertension, follow-up  BP Readings from Last 3 Encounters:  09/19/20 99/65  12/22/19 127/86  10/16/19 (!) 152/86   Wt Readings from Last 3 Encounters:  09/19/20 279 lb 12.8 oz (126.9 kg)  12/22/19 277 lb (125.6 kg)  10/16/19 278 lb (126.1 kg)     She was last seen for hypertension 6 months ago.  BP at that visit was 152/86. Management since that visit includes no changes.  She reports excellent compliance with treatment. She is not having side effects.  She is following a Low Sodium diet. She is not exercising. She does not smoke.  Use of agents associated with hypertension: none.   Outside blood pressures are 120-140/70-90, some high  160/89. Symptoms: No chest pain Yes chest pressure  No palpitations No syncope  Yes dyspnea No orthopnea  No paroxysmal nocturnal dyspnea No lower extremity edema   Pertinent labs: Lab Results  Component Value Date   CHOL 163 10/19/2019   HDL 41 10/19/2019   LDLCALC 102 (H) 10/19/2019   TRIG 108 10/19/2019   CHOLHDL 4.0 10/19/2019   Lab Results  Component Value Date   NA 141 12/03/2019   K 4.0 12/03/2019   CREATININE 1.29 (H) 12/03/2019   GFRNONAA 40 (L) 12/03/2019   GFRAA 46 (L) 12/03/2019   GLUCOSE 102 (H) 12/03/2019     The 10-year ASCVD risk score Mikey Bussing DC Jr., et al., 2013) is: 19.2%   --------------------------------------------------------------------------------------------------- Lipid/Cholesterol, Follow-up  Last lipid panel Other pertinent labs  Lab Results  Component Value Date   CHOL 163 10/19/2019   HDL 41 10/19/2019   LDLCALC 102 (H) 10/19/2019   TRIG 108 10/19/2019   CHOLHDL 4.0 10/19/2019   Lab Results  Component Value Date   ALT 29 10/19/2019   AST 27 10/19/2019   PLT 245 10/19/2019   TSH 4.340 10/19/2019     She was last seen for this 6 months ago.  Management since that visit includes no changes.  She reports excellent compliance with treatment. She is not having side effects.   Symptoms: No chest pain Yes chest pressure/discomfort  Yes dyspnea No lower extremity edema  No numbness or tingling of extremity No orthopnea  No palpitations No paroxysmal nocturnal dyspnea  No speech difficulty No syncope   Current diet: in general,  an "unhealthy" diet Current exercise: none  The 10-year ASCVD risk score Mikey Bussing DC Jr., et al., 2013) is: 19.2%    Atrial Fibrillation  She states that she was prescribed Warfarin but she did not begin taking them daily. She reports that because of her leaky mitral valve she was afraid that she would leak more if she began the Warfarin. She also does not want to have to take the Warfarin for the rest of her  life and is afraid that once she begins she will not be allowed to stop.  Physical Activity  She states that her physical activity has decreased. She reports that when she cleans she has to clean in stages as opposed to being able to complete the whole process in the past.   Inflammation She states that she is experiencing inflammation throughout her body. She expressed the sensation as a warm feeling all over her body. The symptoms are so severe that she states that she will wake from her sleep drenched in sweat.   ---------------------------------------------------------------------------------------------------   Patient Active Problem List   Diagnosis Date Noted  . Benign hypertension with CKD (chronic kidney disease) stage III ([place]) 09/19/2020  . Stage 3b chronic kidney disease ([place]) 09/19/2020  . Atrial fibrillation ([place]) 10/16/2019  . Chronic combined systolic and diastolic heart failure ([place]) 10/16/2019  . Swelling of limb 07/21/2019  . Benign neoplasm of cecum   . Benign neoplasm of ascending colon   . Bradycardia 04/25/2015  . H/O cardiovascular disorder 12/01/2014  . LBP (low back pain) 12/01/2014  . Adenopathy 12/01/2014  . Adenitis, salivary, recurring 12/01/2014  . Absence of bladder continence 12/01/2014  . Arthritis of knee, degenerative 09/30/2013  . Carotid artery obstruction 04/20/2009  . Obesity, Class III, BMI 40-49.9 (morbid obesity) ([place]) 10/19/2008  . Family history of cardiovascular disease 07/06/2007  . Mitral regurgitation 02/23/2007  . History of colonic polyps 02/23/2007  . Acne erythematosa 02/23/2007  . Menopausal symptom 02/23/2007  . Hyperlipidemia 02/19/2007  . Adult hypothyroidism 02/19/2007   Social History   Tobacco Use  . Smoking status: Former Smoker    Types: Cigarettes    Quit date: 05/13/1996    Years since quitting: 24.3  . Smokeless tobacco: Never Used  Vaping Use  . Vaping Use: Never used  Substance Use Topics  . Alcohol use:  Yes    Alcohol/week: 0.0 - 2.0 standard drinks    Comment: occasionally   . Drug use: No   Allergies  Allergen Reactions  . Tegaderm Ag Mesh 2"X2"  [Wound Dressings]     when removing the patch her skin came off.  . Ciprofloxacin Rash  . Latex Rash    Adhesive from covering for PICC line tore skin  . Lisinopril Cough  . Penicillins Rash       Medications: Outpatient Medications Prior to Visit  Medication Sig  . acetaminophen (TYLENOL) 650 MG CR tablet Take 650 mg by mouth every 8 (eight) hours as needed for pain.  Marland Kitchen aspirin EC 81 MG tablet Take 81 mg by mouth. Every 4 days  . atorvastatin (LIPITOR) 20 MG tablet TAKE 1 TABLET BY MOUTH AT  BEDTIME FOR CHOLESTEROL  . Biotin 5 MG CAPS Take 10 mg by mouth.   . Calcium Carbonate-Vitamin D 600-200 MG-UNIT TABS Take 2 tablets by mouth daily.  . Cholecalciferol (VITAMIN D3) 50 MCG (2000 UT) capsule Take 2,000 Units by mouth daily.  . Chromium Picolinate 1000 MCG TABS Take 500 mcg by mouth daily.   Marland Kitchen  Coenzyme Q10 100 MG TABS Take 1 capsule by mouth daily.  . Echinacea 450 MG CAPS Take 1 capsule by mouth daily.  . furosemide (LASIX) 20 MG tablet TAKE 1 TABLET BY MOUTH  DAILY  . Glucos-Chond-Hyal Ac-Ca Fructo (MOVE FREE JOINT HEALTH ADVANCE) TABS Take 2 tablets by mouth daily.   . hydrochlorothiazide (HYDRODIURIL) 12.5 MG tablet TAKE 1 TABLET BY MOUTH  DAILY  . Krill Oil 350 MG CAPS Take 1 tablet by mouth daily.  Marland Kitchen levothyroxine (SYNTHROID) 112 MCG tablet TAKE 1 TABLET BY MOUTH  DAILY BEFORE BREAKFAST  . losartan (COZAAR) 100 MG tablet Take 1 tablet (100 mg total) by mouth daily.  . Magnesium 250 MG TABS Take 1 tablet (250 mg total) by mouth daily. (Patient taking differently: Take 25 mg by mouth daily.)  . MULTIPLE VITAMIN PO Take 1 tablet by mouth daily.   . Multiple Vitamins-Minerals (ICAPS AREDS 2 PO) Take by mouth daily.  . naproxen sodium (ALEVE) 220 MG tablet Take 220 mg by mouth daily as needed.  . Omega-3 Fatty Acids (FISH OIL  EXTRA STRENGTH) 1200 MG CAPS Take 1 capsule by mouth daily.  . potassium chloride SA (KLOR-CON) 20 MEQ tablet TAKE 1 TABLET BY MOUTH  DAILY WITH LASIX  . SUPER B COMPLEX/C PO Take 1 tablet by mouth. Twice a week  . triamcinolone (KENALOG) 0.1 % APPLY TOPICALLY TWO TIMES  DAILY  . Zinc 50 MG CAPS Take 50 mg by mouth daily.  . [DISCONTINUED] albuterol (PROVENTIL HFA;VENTOLIN HFA) 108 (90 Base) MCG/ACT inhaler Inhale 2 puffs into the lungs every 6 (six) hours as needed for wheezing or shortness of breath. (Patient not taking: No sig reported)  . [DISCONTINUED] Cyanocobalamin (B-12) 2500 MCG TABS Take 2,500 mcg by mouth daily. (Patient not taking: Reported on 09/19/2020)  . [DISCONTINUED] Tiotropium Bromide Monohydrate (SPIRIVA RESPIMAT) 2.5 MCG/ACT AERS Inhale 1 puff into the lungs daily. (Patient not taking: No sig reported)  . [DISCONTINUED] warfarin (COUMADIN) 5 MG tablet Take 5 mg by mouth daily. (Patient not taking: Reported on 09/19/2020)   No facility-administered medications prior to visit.    Review of Systems  Constitutional: Positive for appetite change. Negative for activity change, chills, fatigue, fever and unexpected weight change.  HENT: Positive for congestion and sinus pressure. Negative for ear pain, rhinorrhea, sinus pain and sore throat.   Respiratory: Positive for shortness of breath. Negative for apnea, cough, chest tightness and wheezing.   Cardiovascular: Negative for chest pain, palpitations and leg swelling.  Gastrointestinal: Positive for constipation and diarrhea. Negative for abdominal pain, blood in stool, nausea and vomiting.  Genitourinary: Negative for dysuria, flank pain, frequency and urgency.  Musculoskeletal: Positive for myalgias.  Neurological: Negative for dizziness and headaches.  Psychiatric/Behavioral: Positive for sleep disturbance. The patient is nervous/anxious.         Objective    BP 99/65 (BP Location: Left Arm, Patient Position: Sitting, Cuff  Size: Large)   Pulse 96   Temp 98.9 F (37.2 C) (Oral)   Resp 16   Ht 5\' 6"  (1.676 m)   Wt 279 lb 12.8 oz (126.9 kg)   SpO2 98%   BMI 45.16 kg/m  BP Readings from Last 3 Encounters:  09/19/20 99/65  12/22/19 127/86  10/16/19 (!) 152/86   Wt Readings from Last 3 Encounters:  09/19/20 279 lb 12.8 oz (126.9 kg)  12/22/19 277 lb (125.6 kg)  10/16/19 278 lb (126.1 kg)      Physical Exam Vitals reviewed.  Constitutional:  General: She is not in acute distress.    Appearance: Normal appearance. She is well-developed. She is not diaphoretic.  HENT:     Head: Normocephalic and atraumatic.  Eyes:     General: No scleral icterus.    Conjunctiva/sclera: Conjunctivae normal.  Neck:     Thyroid: No thyromegaly.  Cardiovascular:     Rate and Rhythm: Normal rate and regular rhythm.     Pulses: Normal pulses.     Heart sounds: Murmur heard.    Pulmonary:     Effort: Pulmonary effort is normal. No respiratory distress.     Breath sounds: Normal breath sounds. No wheezing, rhonchi or rales.  Musculoskeletal:     Cervical back: Neck supple.     Right lower leg: No edema.     Left lower leg: No edema.  Lymphadenopathy:     Cervical: No cervical adenopathy.  Skin:    General: Skin is warm and dry.     Findings: No rash.  Neurological:     Mental Status: She is alert and oriented to person, place, and time. Mental status is at baseline.  Psychiatric:        Mood and Affect: Mood normal.        Behavior: Behavior normal.      No results found for any visits on 09/19/20.  Assessment & Plan     Problem List Items Addressed This Visit      Cardiovascular and Mediastinum   Atrial fibrillation ([place])    Followed by Cardiology [facility] today States she is not taking coumadin at this time      Chronic combined systolic and diastolic heart failure ([place])    Followed by cardiology Euvolemic today No changes to meds      Relevant Orders   Comprehensive metabolic panel    Benign hypertension with CKD (chronic kidney disease) stage III ([place]) - Primary    Well controlled Continue current medications Recheck metabolic panel F/u in 6 months       Relevant Orders   Comprehensive metabolic panel     Endocrine   Adult hypothyroidism    Previously well controlled Continue Synthroid at current dose  Recheck TSH and adjust Synthroid as indicated       Relevant Orders   TSH     Genitourinary   Stage 3b chronic kidney disease ([place])    Recheck metabolic panel Continue to monitor  Continue ARB Avoid NSAIDs as possible      Relevant Orders   Comprehensive metabolic panel     Other   Hyperlipidemia    Previously well controlled Continue statin and CoQ10 Repeat FLP and CMP      Relevant Orders   Comprehensive metabolic panel   Lipid panel   Obesity, Class III, BMI 40-49.9 (morbid obesity) ([place])    Discussed importance of healthy weight management Discussed diet and exercise       Relevant Orders   TSH   Comprehensive metabolic panel   Lipid panel       Return in about 6 months (around 03/22/2021) for AWV, CPE.       Frederic Jericho [place],acting as a scribe for Lavon Paganini, MD.,have documented all relevant documentation on the behalf of Lavon Paganini, MD,as directed by  Lavon Paganini, MD while in the presence of Lavon Paganini, MD.  I, Lavon Paganini, MD, have reviewed all documentation for this visit. The documentation on 09/19/20 for the exam, diagnosis, procedures, and orders are all accurate and complete.  Makaylynn Bonillas, Dionne Bucy, MD, MPH [facility]

## 2020-09-19 NOTE — Assessment & Plan Note (Signed)
Discussed importance of healthy weight management Discussed diet and exercise  

## 2020-09-19 NOTE — Assessment & Plan Note (Signed)
Recheck metabolic panel Continue to monitor  Continue ARB Avoid NSAIDs as possible

## 2020-09-19 NOTE — Assessment & Plan Note (Signed)
Previously well controlled Continue Synthroid at current dose  Recheck TSH and adjust Synthroid as indicated   

## 2020-09-20 ENCOUNTER — Encounter: Payer: Self-pay | Admitting: Family Medicine

## 2020-09-20 DIAGNOSIS — I10 Essential (primary) hypertension: Secondary | ICD-10-CM

## 2020-09-20 DIAGNOSIS — I5042 Chronic combined systolic (congestive) and diastolic (congestive) heart failure: Secondary | ICD-10-CM

## 2020-09-20 DIAGNOSIS — E039 Hypothyroidism, unspecified: Secondary | ICD-10-CM

## 2020-09-20 DIAGNOSIS — E7800 Pure hypercholesterolemia, unspecified: Secondary | ICD-10-CM

## 2020-09-20 MED ORDER — ATORVASTATIN CALCIUM 20 MG PO TABS: 20.0000 mg | ORAL_TABLET | Freq: Every day | ORAL | 3 refills | Status: DC

## 2020-09-20 MED ORDER — FUROSEMIDE 20 MG PO TABS: 20.0000 mg | ORAL_TABLET | Freq: Every day | ORAL | 3 refills | Status: DC

## 2020-09-20 MED ORDER — POTASSIUM CHLORIDE CRYS ER 20 MEQ PO TBCR: EXTENDED_RELEASE_TABLET | ORAL | 3 refills | Status: DC

## 2020-09-20 MED ORDER — HYDROCHLOROTHIAZIDE 12.5 MG PO TABS: 12.5000 mg | ORAL_TABLET | Freq: Every day | ORAL | 3 refills | Status: DC

## 2020-09-20 MED ORDER — LEVOTHYROXINE SODIUM 112 MCG PO TABS: 112.0000 ug | ORAL_TABLET | Freq: Every day | ORAL | 3 refills | Status: DC

## 2020-09-20 MED ORDER — LOSARTAN POTASSIUM 100 MG PO TABS: 100.0000 mg | ORAL_TABLET | Freq: Every day | ORAL | 2 refills | Status: DC

## 2020-09-21 DIAGNOSIS — N183 Chronic kidney disease, stage 3 unspecified: Secondary | ICD-10-CM | POA: Diagnosis not present

## 2020-09-21 DIAGNOSIS — I129 Hypertensive chronic kidney disease with stage 1 through stage 4 chronic kidney disease, or unspecified chronic kidney disease: Secondary | ICD-10-CM | POA: Diagnosis not present

## 2020-09-21 DIAGNOSIS — E7800 Pure hypercholesterolemia, unspecified: Secondary | ICD-10-CM | POA: Diagnosis not present

## 2020-09-21 DIAGNOSIS — E039 Hypothyroidism, unspecified: Secondary | ICD-10-CM | POA: Diagnosis not present

## 2020-09-22 LAB — TSH: TSH: 4.9 u[IU]/mL — ABNORMAL HIGH (ref 0.450–4.500)

## 2020-09-22 LAB — COMPREHENSIVE METABOLIC PANEL
ALT: 21 IU/L (ref 0–32)
AST: 24 IU/L (ref 0–40)
Albumin/Globulin Ratio: 1.7 (ref 1.2–2.2)
Albumin: 4.1 g/dL (ref 3.7–4.7)
Alkaline Phosphatase: 74 IU/L (ref 44–121)
BUN/Creatinine Ratio: 15 (ref 12–28)
BUN: 18 mg/dL (ref 8–27)
Bilirubin Total: 0.7 mg/dL (ref 0.0–1.2)
CO2: 27 mmol/L (ref 20–29)
Calcium: 10.2 mg/dL (ref 8.7–10.3)
Chloride: 101 mmol/L (ref 96–106)
Creatinine, Ser: 1.24 mg/dL — ABNORMAL HIGH (ref 0.57–1.00)
Globulin, Total: 2.4 g/dL (ref 1.5–4.5)
Glucose: 107 mg/dL — ABNORMAL HIGH (ref 65–99)
Potassium: 4.1 mmol/L (ref 3.5–5.2)
Sodium: 142 mmol/L (ref 134–144)
Total Protein: 6.5 g/dL (ref 6.0–8.5)
eGFR: 44 mL/min/{1.73_m2} — ABNORMAL LOW (ref >59)

## 2020-09-22 LAB — LIPID PANEL
Chol/HDL Ratio: 3.4 ratio (ref 0.0–4.4)
Cholesterol, Total: 157 mg/dL (ref 100–199)
HDL: 46 mg/dL (ref >39)
LDL Chol Calc (NIH): 95 mg/dL (ref 0–99)
Triglycerides: 85 mg/dL (ref 0–149)
VLDL Cholesterol Cal: 16 mg/dL (ref 5–40)

## 2020-09-23 ENCOUNTER — Telehealth: Payer: Self-pay

## 2020-09-23 DIAGNOSIS — E039 Hypothyroidism, unspecified: Secondary | ICD-10-CM

## 2020-09-23 DIAGNOSIS — I208 Other forms of angina pectoris: Secondary | ICD-10-CM | POA: Diagnosis not present

## 2020-09-23 DIAGNOSIS — E079 Disorder of thyroid, unspecified: Secondary | ICD-10-CM | POA: Diagnosis not present

## 2020-09-23 DIAGNOSIS — N1832 Chronic kidney disease, stage 3b: Secondary | ICD-10-CM | POA: Diagnosis not present

## 2020-09-23 DIAGNOSIS — E782 Mixed hyperlipidemia: Secondary | ICD-10-CM | POA: Diagnosis not present

## 2020-09-23 DIAGNOSIS — I4891 Unspecified atrial fibrillation: Secondary | ICD-10-CM | POA: Diagnosis not present

## 2020-09-23 DIAGNOSIS — R0602 Shortness of breath: Secondary | ICD-10-CM | POA: Diagnosis not present

## 2020-09-23 DIAGNOSIS — I10 Essential (primary) hypertension: Secondary | ICD-10-CM | POA: Diagnosis not present

## 2020-09-23 DIAGNOSIS — I5042 Chronic combined systolic (congestive) and diastolic (congestive) heart failure: Secondary | ICD-10-CM | POA: Diagnosis not present

## 2020-09-23 MED ORDER — LEVOTHYROXINE SODIUM 125 MCG PO TABS: 125.0000 ug | ORAL_TABLET | Freq: Every day | ORAL | 0 refills | Status: DC

## 2020-09-23 NOTE — Telephone Encounter (Signed)
-----   Message from Virginia Crews, MD sent at 09/22/2020  8:28 AM EDT ----- Stable labs, except TSH is slightly elevated.  This means that Synthroid dose is too low, as long as she has been taking it regularly.  If this is the case, we can increase her dose to 125 mcg daily.  Okay to send new prescription.  Recheck TSH in 2 months.

## 2020-09-27 DIAGNOSIS — M9902 Segmental and somatic dysfunction of thoracic region: Secondary | ICD-10-CM | POA: Diagnosis not present

## 2020-09-27 DIAGNOSIS — M9903 Segmental and somatic dysfunction of lumbar region: Secondary | ICD-10-CM | POA: Diagnosis not present

## 2020-09-27 DIAGNOSIS — M5136 Other intervertebral disc degeneration, lumbar region: Secondary | ICD-10-CM | POA: Diagnosis not present

## 2020-09-27 DIAGNOSIS — M62830 Muscle spasm of back: Secondary | ICD-10-CM | POA: Diagnosis not present

## 2020-09-29 DIAGNOSIS — I4891 Unspecified atrial fibrillation: Secondary | ICD-10-CM | POA: Diagnosis not present

## 2020-10-06 DIAGNOSIS — Z7901 Long term (current) use of anticoagulants: Secondary | ICD-10-CM | POA: Diagnosis not present

## 2020-10-11 DIAGNOSIS — M62830 Muscle spasm of back: Secondary | ICD-10-CM | POA: Diagnosis not present

## 2020-10-11 DIAGNOSIS — M9903 Segmental and somatic dysfunction of lumbar region: Secondary | ICD-10-CM | POA: Diagnosis not present

## 2020-10-11 DIAGNOSIS — M5136 Other intervertebral disc degeneration, lumbar region: Secondary | ICD-10-CM | POA: Diagnosis not present

## 2020-10-11 DIAGNOSIS — M9902 Segmental and somatic dysfunction of thoracic region: Secondary | ICD-10-CM | POA: Diagnosis not present

## 2020-10-12 DIAGNOSIS — M9902 Segmental and somatic dysfunction of thoracic region: Secondary | ICD-10-CM | POA: Diagnosis not present

## 2020-10-12 DIAGNOSIS — M62830 Muscle spasm of back: Secondary | ICD-10-CM | POA: Diagnosis not present

## 2020-10-12 DIAGNOSIS — M9903 Segmental and somatic dysfunction of lumbar region: Secondary | ICD-10-CM | POA: Diagnosis not present

## 2020-10-12 DIAGNOSIS — M5136 Other intervertebral disc degeneration, lumbar region: Secondary | ICD-10-CM | POA: Diagnosis not present

## 2020-10-13 DIAGNOSIS — Z7901 Long term (current) use of anticoagulants: Secondary | ICD-10-CM | POA: Diagnosis not present

## 2020-10-13 DIAGNOSIS — M9902 Segmental and somatic dysfunction of thoracic region: Secondary | ICD-10-CM | POA: Diagnosis not present

## 2020-10-13 DIAGNOSIS — M5136 Other intervertebral disc degeneration, lumbar region: Secondary | ICD-10-CM | POA: Diagnosis not present

## 2020-10-13 DIAGNOSIS — M9903 Segmental and somatic dysfunction of lumbar region: Secondary | ICD-10-CM | POA: Diagnosis not present

## 2020-10-13 DIAGNOSIS — M62830 Muscle spasm of back: Secondary | ICD-10-CM | POA: Diagnosis not present

## 2020-10-14 DIAGNOSIS — M25561 Pain in right knee: Secondary | ICD-10-CM | POA: Diagnosis not present

## 2020-10-14 DIAGNOSIS — M170 Bilateral primary osteoarthritis of knee: Secondary | ICD-10-CM | POA: Diagnosis not present

## 2020-10-15 DIAGNOSIS — M5136 Other intervertebral disc degeneration, lumbar region: Secondary | ICD-10-CM | POA: Diagnosis not present

## 2020-10-15 DIAGNOSIS — M9902 Segmental and somatic dysfunction of thoracic region: Secondary | ICD-10-CM | POA: Diagnosis not present

## 2020-10-15 DIAGNOSIS — M9903 Segmental and somatic dysfunction of lumbar region: Secondary | ICD-10-CM | POA: Diagnosis not present

## 2020-10-15 DIAGNOSIS — M62830 Muscle spasm of back: Secondary | ICD-10-CM | POA: Diagnosis not present

## 2020-10-17 DIAGNOSIS — Z79899 Other long term (current) drug therapy: Secondary | ICD-10-CM | POA: Diagnosis not present

## 2020-10-17 DIAGNOSIS — Z5181 Encounter for therapeutic drug level monitoring: Secondary | ICD-10-CM | POA: Diagnosis not present

## 2020-10-17 DIAGNOSIS — I4891 Unspecified atrial fibrillation: Secondary | ICD-10-CM | POA: Diagnosis not present

## 2020-10-17 DIAGNOSIS — Z7901 Long term (current) use of anticoagulants: Secondary | ICD-10-CM | POA: Diagnosis not present

## 2020-10-28 DIAGNOSIS — H35373 Puckering of macula, bilateral: Secondary | ICD-10-CM | POA: Diagnosis not present

## 2020-11-01 DIAGNOSIS — M9903 Segmental and somatic dysfunction of lumbar region: Secondary | ICD-10-CM | POA: Diagnosis not present

## 2020-11-01 DIAGNOSIS — M62830 Muscle spasm of back: Secondary | ICD-10-CM | POA: Diagnosis not present

## 2020-11-01 DIAGNOSIS — M9902 Segmental and somatic dysfunction of thoracic region: Secondary | ICD-10-CM | POA: Diagnosis not present

## 2020-11-01 DIAGNOSIS — M5136 Other intervertebral disc degeneration, lumbar region: Secondary | ICD-10-CM | POA: Diagnosis not present

## 2020-11-08 DIAGNOSIS — Z7901 Long term (current) use of anticoagulants: Secondary | ICD-10-CM | POA: Diagnosis not present

## 2020-11-17 ENCOUNTER — Other Ambulatory Visit: Payer: Self-pay | Admitting: Family Medicine

## 2020-11-17 DIAGNOSIS — E039 Hypothyroidism, unspecified: Secondary | ICD-10-CM

## 2020-11-21 DIAGNOSIS — I208 Other forms of angina pectoris: Secondary | ICD-10-CM | POA: Diagnosis not present

## 2020-11-21 DIAGNOSIS — R079 Chest pain, unspecified: Secondary | ICD-10-CM | POA: Diagnosis not present

## 2020-11-21 DIAGNOSIS — I4891 Unspecified atrial fibrillation: Secondary | ICD-10-CM | POA: Diagnosis not present

## 2020-11-21 DIAGNOSIS — Z7901 Long term (current) use of anticoagulants: Secondary | ICD-10-CM | POA: Diagnosis not present

## 2020-11-22 DIAGNOSIS — M9902 Segmental and somatic dysfunction of thoracic region: Secondary | ICD-10-CM | POA: Diagnosis not present

## 2020-11-22 DIAGNOSIS — M62830 Muscle spasm of back: Secondary | ICD-10-CM | POA: Diagnosis not present

## 2020-11-22 DIAGNOSIS — M9903 Segmental and somatic dysfunction of lumbar region: Secondary | ICD-10-CM | POA: Diagnosis not present

## 2020-11-22 DIAGNOSIS — M5136 Other intervertebral disc degeneration, lumbar region: Secondary | ICD-10-CM | POA: Diagnosis not present

## 2020-11-28 DIAGNOSIS — N1832 Chronic kidney disease, stage 3b: Secondary | ICD-10-CM | POA: Diagnosis not present

## 2020-11-28 DIAGNOSIS — I350 Nonrheumatic aortic (valve) stenosis: Secondary | ICD-10-CM | POA: Diagnosis not present

## 2020-11-28 DIAGNOSIS — E079 Disorder of thyroid, unspecified: Secondary | ICD-10-CM | POA: Diagnosis not present

## 2020-11-28 DIAGNOSIS — I5042 Chronic combined systolic (congestive) and diastolic (congestive) heart failure: Secondary | ICD-10-CM | POA: Diagnosis not present

## 2020-11-28 DIAGNOSIS — E782 Mixed hyperlipidemia: Secondary | ICD-10-CM | POA: Diagnosis not present

## 2020-11-28 DIAGNOSIS — I10 Essential (primary) hypertension: Secondary | ICD-10-CM | POA: Diagnosis not present

## 2020-11-28 DIAGNOSIS — I4891 Unspecified atrial fibrillation: Secondary | ICD-10-CM | POA: Diagnosis not present

## 2020-11-28 DIAGNOSIS — R079 Chest pain, unspecified: Secondary | ICD-10-CM | POA: Diagnosis not present

## 2020-11-28 DIAGNOSIS — R0600 Dyspnea, unspecified: Secondary | ICD-10-CM | POA: Diagnosis not present

## 2020-11-29 DIAGNOSIS — Z7901 Long term (current) use of anticoagulants: Secondary | ICD-10-CM | POA: Diagnosis not present

## 2020-12-12 DIAGNOSIS — M9902 Segmental and somatic dysfunction of thoracic region: Secondary | ICD-10-CM | POA: Diagnosis not present

## 2020-12-12 DIAGNOSIS — M5136 Other intervertebral disc degeneration, lumbar region: Secondary | ICD-10-CM | POA: Diagnosis not present

## 2020-12-12 DIAGNOSIS — M62830 Muscle spasm of back: Secondary | ICD-10-CM | POA: Diagnosis not present

## 2020-12-12 DIAGNOSIS — M9903 Segmental and somatic dysfunction of lumbar region: Secondary | ICD-10-CM | POA: Diagnosis not present

## 2020-12-13 DIAGNOSIS — M9903 Segmental and somatic dysfunction of lumbar region: Secondary | ICD-10-CM | POA: Diagnosis not present

## 2020-12-13 DIAGNOSIS — M9902 Segmental and somatic dysfunction of thoracic region: Secondary | ICD-10-CM | POA: Diagnosis not present

## 2020-12-13 DIAGNOSIS — M62830 Muscle spasm of back: Secondary | ICD-10-CM | POA: Diagnosis not present

## 2020-12-13 DIAGNOSIS — M5136 Other intervertebral disc degeneration, lumbar region: Secondary | ICD-10-CM | POA: Diagnosis not present

## 2020-12-15 DIAGNOSIS — M9902 Segmental and somatic dysfunction of thoracic region: Secondary | ICD-10-CM | POA: Diagnosis not present

## 2020-12-15 DIAGNOSIS — M62830 Muscle spasm of back: Secondary | ICD-10-CM | POA: Diagnosis not present

## 2020-12-15 DIAGNOSIS — M5136 Other intervertebral disc degeneration, lumbar region: Secondary | ICD-10-CM | POA: Diagnosis not present

## 2020-12-15 DIAGNOSIS — M9903 Segmental and somatic dysfunction of lumbar region: Secondary | ICD-10-CM | POA: Diagnosis not present

## 2020-12-19 DIAGNOSIS — M9903 Segmental and somatic dysfunction of lumbar region: Secondary | ICD-10-CM | POA: Diagnosis not present

## 2020-12-19 DIAGNOSIS — M9902 Segmental and somatic dysfunction of thoracic region: Secondary | ICD-10-CM | POA: Diagnosis not present

## 2020-12-19 DIAGNOSIS — M62830 Muscle spasm of back: Secondary | ICD-10-CM | POA: Diagnosis not present

## 2020-12-19 DIAGNOSIS — M5136 Other intervertebral disc degeneration, lumbar region: Secondary | ICD-10-CM | POA: Diagnosis not present

## 2020-12-22 DIAGNOSIS — M9903 Segmental and somatic dysfunction of lumbar region: Secondary | ICD-10-CM | POA: Diagnosis not present

## 2020-12-22 DIAGNOSIS — M9902 Segmental and somatic dysfunction of thoracic region: Secondary | ICD-10-CM | POA: Diagnosis not present

## 2020-12-22 DIAGNOSIS — M5136 Other intervertebral disc degeneration, lumbar region: Secondary | ICD-10-CM | POA: Diagnosis not present

## 2020-12-22 DIAGNOSIS — M62830 Muscle spasm of back: Secondary | ICD-10-CM | POA: Diagnosis not present

## 2020-12-26 DIAGNOSIS — E079 Disorder of thyroid, unspecified: Secondary | ICD-10-CM | POA: Diagnosis not present

## 2020-12-26 DIAGNOSIS — Z5181 Encounter for therapeutic drug level monitoring: Secondary | ICD-10-CM | POA: Diagnosis not present

## 2020-12-26 DIAGNOSIS — Z79899 Other long term (current) drug therapy: Secondary | ICD-10-CM | POA: Diagnosis not present

## 2020-12-26 DIAGNOSIS — I5042 Chronic combined systolic (congestive) and diastolic (congestive) heart failure: Secondary | ICD-10-CM | POA: Diagnosis not present

## 2020-12-26 DIAGNOSIS — E782 Mixed hyperlipidemia: Secondary | ICD-10-CM | POA: Diagnosis not present

## 2020-12-26 DIAGNOSIS — N1832 Chronic kidney disease, stage 3b: Secondary | ICD-10-CM | POA: Diagnosis not present

## 2020-12-26 DIAGNOSIS — R0609 Other forms of dyspnea: Secondary | ICD-10-CM | POA: Diagnosis not present

## 2020-12-26 DIAGNOSIS — I10 Essential (primary) hypertension: Secondary | ICD-10-CM | POA: Diagnosis not present

## 2020-12-26 DIAGNOSIS — R079 Chest pain, unspecified: Secondary | ICD-10-CM | POA: Diagnosis not present

## 2020-12-26 DIAGNOSIS — I4891 Unspecified atrial fibrillation: Secondary | ICD-10-CM | POA: Diagnosis not present

## 2020-12-26 DIAGNOSIS — I350 Nonrheumatic aortic (valve) stenosis: Secondary | ICD-10-CM | POA: Diagnosis not present

## 2020-12-26 DIAGNOSIS — Z7901 Long term (current) use of anticoagulants: Secondary | ICD-10-CM | POA: Diagnosis not present

## 2020-12-26 DIAGNOSIS — Z01818 Encounter for other preprocedural examination: Secondary | ICD-10-CM | POA: Diagnosis not present

## 2021-01-02 DIAGNOSIS — Z01818 Encounter for other preprocedural examination: Secondary | ICD-10-CM | POA: Diagnosis not present

## 2021-01-02 DIAGNOSIS — I10 Essential (primary) hypertension: Secondary | ICD-10-CM | POA: Diagnosis not present

## 2021-01-02 DIAGNOSIS — Z03818 Encounter for observation for suspected exposure to other biological agents ruled out: Secondary | ICD-10-CM | POA: Diagnosis not present

## 2021-01-04 DIAGNOSIS — M9902 Segmental and somatic dysfunction of thoracic region: Secondary | ICD-10-CM | POA: Diagnosis not present

## 2021-01-04 DIAGNOSIS — M62830 Muscle spasm of back: Secondary | ICD-10-CM | POA: Diagnosis not present

## 2021-01-04 DIAGNOSIS — M9903 Segmental and somatic dysfunction of lumbar region: Secondary | ICD-10-CM | POA: Diagnosis not present

## 2021-01-04 DIAGNOSIS — M5136 Other intervertebral disc degeneration, lumbar region: Secondary | ICD-10-CM | POA: Diagnosis not present

## 2021-01-18 ENCOUNTER — Ambulatory Visit: Payer: Medicare Other | Admitting: Anesthesiology

## 2021-01-18 ENCOUNTER — Ambulatory Visit
Admission: RE | Admit: 2021-01-18 | Discharge: 2021-01-18 | Disposition: A | Discharge status: Home / Self Care | Payer: Medicare Other | Attending: Internal Medicine | Admitting: Internal Medicine

## 2021-01-18 ENCOUNTER — Encounter: Payer: Self-pay | Admitting: Internal Medicine

## 2021-01-18 ENCOUNTER — Encounter
Admission: RE | Disposition: A | Discharge status: Home / Self Care | Payer: Self-pay | Source: Home / Self Care | Attending: Internal Medicine

## 2021-01-18 ENCOUNTER — Other Ambulatory Visit: Payer: Self-pay

## 2021-01-18 DIAGNOSIS — Z6841 Body Mass Index (BMI) 40.0 and over, adult: Secondary | ICD-10-CM | POA: Insufficient documentation

## 2021-01-18 DIAGNOSIS — I130 Hypertensive heart and chronic kidney disease with heart failure and stage 1 through stage 4 chronic kidney disease, or unspecified chronic kidney disease: Secondary | ICD-10-CM | POA: Insufficient documentation

## 2021-01-18 DIAGNOSIS — E039 Hypothyroidism, unspecified: Secondary | ICD-10-CM | POA: Insufficient documentation

## 2021-01-18 DIAGNOSIS — I480 Paroxysmal atrial fibrillation: Secondary | ICD-10-CM | POA: Insufficient documentation

## 2021-01-18 DIAGNOSIS — Z79890 Hormone replacement therapy (postmenopausal): Secondary | ICD-10-CM | POA: Insufficient documentation

## 2021-01-18 DIAGNOSIS — N1832 Chronic kidney disease, stage 3b: Secondary | ICD-10-CM | POA: Diagnosis not present

## 2021-01-18 DIAGNOSIS — I4892 Unspecified atrial flutter: Secondary | ICD-10-CM | POA: Diagnosis not present

## 2021-01-18 DIAGNOSIS — Z888 Allergy status to other drugs, medicaments and biological substances status: Secondary | ICD-10-CM | POA: Insufficient documentation

## 2021-01-18 DIAGNOSIS — Z91040 Latex allergy status: Secondary | ICD-10-CM | POA: Insufficient documentation

## 2021-01-18 DIAGNOSIS — Z79899 Other long term (current) drug therapy: Secondary | ICD-10-CM | POA: Diagnosis not present

## 2021-01-18 DIAGNOSIS — I080 Rheumatic disorders of both mitral and aortic valves: Secondary | ICD-10-CM | POA: Diagnosis not present

## 2021-01-18 DIAGNOSIS — E785 Hyperlipidemia, unspecified: Secondary | ICD-10-CM | POA: Insufficient documentation

## 2021-01-18 DIAGNOSIS — Z881 Allergy status to other antibiotic agents status: Secondary | ICD-10-CM | POA: Insufficient documentation

## 2021-01-18 DIAGNOSIS — I509 Heart failure, unspecified: Secondary | ICD-10-CM | POA: Insufficient documentation

## 2021-01-18 DIAGNOSIS — Z7901 Long term (current) use of anticoagulants: Secondary | ICD-10-CM | POA: Insufficient documentation

## 2021-01-18 DIAGNOSIS — Z8249 Family history of ischemic heart disease and other diseases of the circulatory system: Secondary | ICD-10-CM | POA: Insufficient documentation

## 2021-01-18 DIAGNOSIS — Z91048 Other nonmedicinal substance allergy status: Secondary | ICD-10-CM | POA: Insufficient documentation

## 2021-01-18 DIAGNOSIS — I4891 Unspecified atrial fibrillation: Secondary | ICD-10-CM | POA: Diagnosis not present

## 2021-01-18 DIAGNOSIS — Z87891 Personal history of nicotine dependence: Secondary | ICD-10-CM | POA: Insufficient documentation

## 2021-01-18 DIAGNOSIS — Z880 Allergy status to penicillin: Secondary | ICD-10-CM | POA: Diagnosis not present

## 2021-01-18 DIAGNOSIS — R0609 Other forms of dyspnea: Secondary | ICD-10-CM | POA: Diagnosis not present

## 2021-01-18 HISTORY — PX: CARDIOVERSION: SHX1299

## 2021-01-18 SURGERY — CARDIOVERSION
Anesthesia: General

## 2021-01-18 MED ORDER — ONDANSETRON HCL 4 MG/2ML IJ SOLN: 4.0000 mg | Freq: Once | INTRAMUSCULAR | Status: DC | PRN

## 2021-01-18 MED ORDER — FENTANYL CITRATE (PF) 100 MCG/2ML IJ SOLN: 25.0000 ug | INTRAMUSCULAR | Status: DC | PRN

## 2021-01-18 MED ORDER — SODIUM CHLORIDE 0.9 % IV SOLN: INTRAVENOUS | Status: DC | PRN

## 2021-01-18 MED ORDER — PROPOFOL 10 MG/ML IV BOLUS
INTRAVENOUS | Status: DC | PRN
  Administered 2021-01-18 (×3): 10 mg via INTRAVENOUS
  Administered 2021-01-18: 50 mg via INTRAVENOUS

## 2021-01-18 NOTE — Transfer of Care (Signed)
Immediate Anesthesia Transfer of Care Note  [name]  Procedure(s) Performed: CARDIOVERSION  Patient Location: PACU and Cath Lab  Anesthesia Type:General  Level of Consciousness: drowsy and patient cooperative  Airway & Oxygen Therapy: Patient Spontanous Breathing and Patient connected to nasal cannula oxygen  Post-op Assessment: Report given to RN and Post -op Vital signs reviewed and stable  Post vital signs: Reviewed and stable  Last Vitals:  Vitals Value Taken Time  BP 146/84 01/18/21 0755  Temp    Pulse 96 01/18/21 0756  Resp 30 01/18/21 0756  SpO2 96 % 01/18/21 0756  Vitals shown include unvalidated device data.  Last Pain:  Vitals:   01/18/21 0721  TempSrc: Oral         Complications: No notable events documented.

## 2021-01-18 NOTE — Anesthesia Preprocedure Evaluation (Addendum)
Anesthesia Evaluation  Patient identified by MRN, date of birth, ID band Patient awake    Reviewed: Allergy & Precautions, H&P , NPO status , Patient's Chart, lab work & pertinent test results, reviewed documented beta blocker date and time   Airway Mallampati: II   Neck ROM: full    Dental  (+) Poor Dentition   Pulmonary neg pulmonary ROS, former smoker,    Pulmonary exam normal        Cardiovascular Exercise Tolerance: Poor hypertension, On Medications and Pt. on medications Atrial Fibrillation + Valvular Problems/Murmurs  Rhythm:regular Rate:Normal     Neuro/Psych negative neurological ROS  negative psych ROS   GI/Hepatic negative GI ROS, Neg liver ROS,   Endo/Other  Hypothyroidism   Renal/GU Renal disease  negative genitourinary   Musculoskeletal  (+) Arthritis ,   Abdominal   Peds  Hematology negative hematology ROS (+)   Anesthesia Other Findings Past Medical History: No date: Allergy 12/01/2014: Atypical squamous cells of undetermined significance on  cytologic smear of vagina (ASC-US)     Comment:  s/p gynecology consultation 07/2010; s/p repeat pap smear              with HPV.  No date: Cellulitis No date: Heart murmur No date: Hyperlipidemia No date: Hypertension No date: Hypothyroidism No date: Thyroid disease No date: Wears dentures     Comment:  partial lower, full upper Past Surgical History: 1961: ABDOMINAL HYSTERECTOMY     Comment:  BIL Oophorectomy at age 68: APPENDECTOMY 1999: [place] No date: CARDIAC CATHETERIZATION 09/01/2019: CATARACT EXTRACTION W/PHACO; Left     Comment:  Procedure: CATARACT EXTRACTION PHACO AND INTRAOCULAR               LENS PLACEMENT (IOC) LEFT 8.84 00:49.4;  Surgeon:               Birder Robson, MD;  Location: [facility];                Service: Ophthalmology;  Laterality: Left; 12/22/2019: CATARACT EXTRACTION W/PHACO; Right      Comment:  Procedure: CATARACT EXTRACTION PHACO AND INTRAOCULAR               LENS PLACEMENT (IOC) RIGHT 6.77 00:42.9;  Surgeon:               Birder Robson, MD;  Location: [facility];                Service: Ophthalmology;  Laterality: Right; 08/27/2017: COLONOSCOPY WITH PROPOFOL; N/A     Comment:  Procedure: COLONOSCOPY WITH PROPOFOL;  Surgeon: Lucilla Lame, MD;  Location: [facility];  Service:               Endoscopy;  Laterality: N/A; No date: FOOT SURGERY; Bilateral at age 31: TONSILLECTOMY   Reproductive/Obstetrics negative OB ROS                            Anesthesia Physical Anesthesia Plan  ASA: 4  Anesthesia Plan: General   Post-op Pain Management:    Induction:   PONV Risk Score and Plan:   Airway Management Planned:   Additional Equipment:   Intra-op Plan:   Post-operative Plan:   Informed Consent: I have reviewed the patients History and Physical, chart, labs and discussed the procedure including the risks, benefits and alternatives for  the proposed anesthesia with the patient or authorized representative who has indicated his/her understanding and acceptance.     Dental Advisory Given  Plan Discussed with: CRNA  Anesthesia Plan Comments:         Anesthesia Quick Evaluation

## 2021-01-18 NOTE — CV Procedure (Signed)
Electrical Cardioversion Procedure Note   Procedure: Electrical Cardioversion Indications:  Atrial Fibrillation  Procedure Details Consent: Risks of procedure as well as the alternatives and risks of each were explained to the (patient/caregiver).  Consent for procedure obtained. Time Out: Verified patient identification, verified procedure, site/side was marked, verified correct patient position, special equipment/implants available, medications/allergies/relevent history reviewed, required imaging and test results available.  Performed  Patient placed on cardiac monitor, pulse oximetry, supplemental oxygen as necessary.  Sedation given:  Propralol Pacer pads placed anterior and posterior chest.  Cardioverted 3 time(s).  Cardioverted at [place].  Evaluation Findings: Post procedure EKG shows: Atrial Fibrillation Complications: Brief episode of ventricular fibrillation easily cardioverted with 200 J Patient did tolerate procedure well. Conclusion Unsuccessful cardioversion of atrial fibrillation to sinus rhythm  Lujean Amel MD

## 2021-01-26 NOTE — Anesthesia Postprocedure Evaluation (Signed)
Anesthesia Post Note  [name]  Procedure(s) Performed: CARDIOVERSION  Patient location during evaluation: PACU Anesthesia Type: General Level of consciousness: awake and alert Pain management: pain level controlled Vital Signs Assessment: post-procedure vital signs reviewed and stable Respiratory status: spontaneous breathing, nonlabored ventilation, respiratory function stable and patient connected to nasal cannula oxygen Cardiovascular status: blood pressure returned to baseline and stable Postop Assessment: no apparent nausea or vomiting Anesthetic complications: no   No notable events documented.   Last Vitals:  Vitals:   01/18/21 0815 01/18/21 0830  BP: (!) 146/74 (!) 148/71  Pulse: 63 89  Resp: 19 (!) 26  Temp:    SpO2: 98% 96%    Last Pain:  Vitals:   01/18/21 0830  TempSrc:   PainSc: 0-No pain                 Molli Barrows

## 2021-02-01 DIAGNOSIS — M9903 Segmental and somatic dysfunction of lumbar region: Secondary | ICD-10-CM | POA: Diagnosis not present

## 2021-02-01 DIAGNOSIS — M5136 Other intervertebral disc degeneration, lumbar region: Secondary | ICD-10-CM | POA: Diagnosis not present

## 2021-02-01 DIAGNOSIS — M62830 Muscle spasm of back: Secondary | ICD-10-CM | POA: Diagnosis not present

## 2021-02-01 DIAGNOSIS — M9902 Segmental and somatic dysfunction of thoracic region: Secondary | ICD-10-CM | POA: Diagnosis not present

## 2021-02-02 DIAGNOSIS — R5381 Other malaise: Secondary | ICD-10-CM | POA: Diagnosis not present

## 2021-02-02 DIAGNOSIS — N1832 Chronic kidney disease, stage 3b: Secondary | ICD-10-CM | POA: Diagnosis not present

## 2021-02-02 DIAGNOSIS — I10 Essential (primary) hypertension: Secondary | ICD-10-CM | POA: Diagnosis not present

## 2021-02-02 DIAGNOSIS — R918 Other nonspecific abnormal finding of lung field: Secondary | ICD-10-CM | POA: Diagnosis not present

## 2021-02-02 DIAGNOSIS — R079 Chest pain, unspecified: Secondary | ICD-10-CM | POA: Diagnosis not present

## 2021-02-02 DIAGNOSIS — R0689 Other abnormalities of breathing: Secondary | ICD-10-CM | POA: Diagnosis not present

## 2021-02-02 DIAGNOSIS — E079 Disorder of thyroid, unspecified: Secondary | ICD-10-CM | POA: Diagnosis not present

## 2021-02-02 DIAGNOSIS — R0609 Other forms of dyspnea: Secondary | ICD-10-CM | POA: Diagnosis not present

## 2021-02-02 DIAGNOSIS — I4891 Unspecified atrial fibrillation: Secondary | ICD-10-CM | POA: Diagnosis not present

## 2021-02-02 DIAGNOSIS — R110 Nausea: Secondary | ICD-10-CM | POA: Diagnosis not present

## 2021-02-02 DIAGNOSIS — R0989 Other specified symptoms and signs involving the circulatory and respiratory systems: Secondary | ICD-10-CM | POA: Diagnosis not present

## 2021-02-02 DIAGNOSIS — E782 Mixed hyperlipidemia: Secondary | ICD-10-CM | POA: Diagnosis not present

## 2021-02-02 DIAGNOSIS — I350 Nonrheumatic aortic (valve) stenosis: Secondary | ICD-10-CM | POA: Diagnosis not present

## 2021-02-02 DIAGNOSIS — I5042 Chronic combined systolic (congestive) and diastolic (congestive) heart failure: Secondary | ICD-10-CM | POA: Diagnosis not present

## 2021-02-03 ENCOUNTER — Telehealth: Payer: Self-pay

## 2021-02-03 DIAGNOSIS — N183 Chronic kidney disease, stage 3 unspecified: Secondary | ICD-10-CM

## 2021-02-03 DIAGNOSIS — I129 Hypertensive chronic kidney disease with stage 1 through stage 4 chronic kidney disease, or unspecified chronic kidney disease: Secondary | ICD-10-CM

## 2021-02-03 NOTE — Telephone Encounter (Signed)
Copied from [place] [id number]. Topic: Referral - Request for Referral >> Feb 03, 2021 10:44 AM Yvette Rack wrote: Has patient seen PCP for this complaint? Yes.   *If NO, is insurance requiring patient see PCP for this issue before PCP can refer them? Referral for which specialty: Kidney specialist Preferred provider/office: [facility] -  Dr. Hunt Oris Reason for referral: stage 3 kidney failure

## 2021-02-15 DIAGNOSIS — M9902 Segmental and somatic dysfunction of thoracic region: Secondary | ICD-10-CM | POA: Diagnosis not present

## 2021-02-15 DIAGNOSIS — M9903 Segmental and somatic dysfunction of lumbar region: Secondary | ICD-10-CM | POA: Diagnosis not present

## 2021-02-15 DIAGNOSIS — M62830 Muscle spasm of back: Secondary | ICD-10-CM | POA: Diagnosis not present

## 2021-02-15 DIAGNOSIS — M5136 Other intervertebral disc degeneration, lumbar region: Secondary | ICD-10-CM | POA: Diagnosis not present

## 2021-02-24 NOTE — Telephone Encounter (Signed)
Ok to send referral as requested 

## 2021-03-01 DIAGNOSIS — M5136 Other intervertebral disc degeneration, lumbar region: Secondary | ICD-10-CM | POA: Diagnosis not present

## 2021-03-01 DIAGNOSIS — M9903 Segmental and somatic dysfunction of lumbar region: Secondary | ICD-10-CM | POA: Diagnosis not present

## 2021-03-01 DIAGNOSIS — M62830 Muscle spasm of back: Secondary | ICD-10-CM | POA: Diagnosis not present

## 2021-03-01 DIAGNOSIS — M9902 Segmental and somatic dysfunction of thoracic region: Secondary | ICD-10-CM | POA: Diagnosis not present

## 2021-03-06 DIAGNOSIS — M62830 Muscle spasm of back: Secondary | ICD-10-CM | POA: Diagnosis not present

## 2021-03-06 DIAGNOSIS — I350 Nonrheumatic aortic (valve) stenosis: Secondary | ICD-10-CM | POA: Diagnosis not present

## 2021-03-06 DIAGNOSIS — N1832 Chronic kidney disease, stage 3b: Secondary | ICD-10-CM | POA: Diagnosis not present

## 2021-03-06 DIAGNOSIS — M9903 Segmental and somatic dysfunction of lumbar region: Secondary | ICD-10-CM | POA: Diagnosis not present

## 2021-03-06 DIAGNOSIS — E782 Mixed hyperlipidemia: Secondary | ICD-10-CM | POA: Diagnosis not present

## 2021-03-06 DIAGNOSIS — I10 Essential (primary) hypertension: Secondary | ICD-10-CM | POA: Diagnosis not present

## 2021-03-06 DIAGNOSIS — R0609 Other forms of dyspnea: Secondary | ICD-10-CM | POA: Diagnosis not present

## 2021-03-06 DIAGNOSIS — M5136 Other intervertebral disc degeneration, lumbar region: Secondary | ICD-10-CM | POA: Diagnosis not present

## 2021-03-06 DIAGNOSIS — I5042 Chronic combined systolic (congestive) and diastolic (congestive) heart failure: Secondary | ICD-10-CM | POA: Diagnosis not present

## 2021-03-06 DIAGNOSIS — I4891 Unspecified atrial fibrillation: Secondary | ICD-10-CM | POA: Diagnosis not present

## 2021-03-06 DIAGNOSIS — M9902 Segmental and somatic dysfunction of thoracic region: Secondary | ICD-10-CM | POA: Diagnosis not present

## 2021-03-06 DIAGNOSIS — R079 Chest pain, unspecified: Secondary | ICD-10-CM | POA: Diagnosis not present

## 2021-03-16 ENCOUNTER — Other Ambulatory Visit
Admission: RE | Admit: 2021-03-16 | Discharge: 2021-03-16 | Disposition: A | Discharge status: Home / Self Care | Payer: Medicare Other | Source: Ambulatory Visit | Attending: Internal Medicine | Admitting: Internal Medicine

## 2021-03-16 DIAGNOSIS — I4891 Unspecified atrial fibrillation: Secondary | ICD-10-CM | POA: Diagnosis not present

## 2021-03-16 DIAGNOSIS — Z79899 Other long term (current) drug therapy: Secondary | ICD-10-CM | POA: Diagnosis not present

## 2021-03-16 DIAGNOSIS — R600 Localized edema: Secondary | ICD-10-CM | POA: Diagnosis not present

## 2021-03-16 DIAGNOSIS — Z01818 Encounter for other preprocedural examination: Secondary | ICD-10-CM | POA: Insufficient documentation

## 2021-03-16 DIAGNOSIS — I350 Nonrheumatic aortic (valve) stenosis: Secondary | ICD-10-CM | POA: Diagnosis not present

## 2021-03-16 DIAGNOSIS — I5042 Chronic combined systolic (congestive) and diastolic (congestive) heart failure: Secondary | ICD-10-CM | POA: Diagnosis not present

## 2021-03-16 DIAGNOSIS — G4733 Obstructive sleep apnea (adult) (pediatric): Secondary | ICD-10-CM | POA: Diagnosis not present

## 2021-03-16 DIAGNOSIS — R0602 Shortness of breath: Secondary | ICD-10-CM | POA: Diagnosis not present

## 2021-03-16 DIAGNOSIS — I10 Essential (primary) hypertension: Secondary | ICD-10-CM | POA: Diagnosis not present

## 2021-03-16 DIAGNOSIS — N1832 Chronic kidney disease, stage 3b: Secondary | ICD-10-CM | POA: Diagnosis not present

## 2021-03-16 LAB — BRAIN NATRIURETIC PEPTIDE: B Natriuretic Peptide: 361.8 pg/mL — ABNORMAL HIGH (ref 0.0–100.0)

## 2021-03-22 DIAGNOSIS — I4891 Unspecified atrial fibrillation: Secondary | ICD-10-CM | POA: Diagnosis not present

## 2021-03-22 DIAGNOSIS — Z01818 Encounter for other preprocedural examination: Secondary | ICD-10-CM | POA: Diagnosis not present

## 2021-03-22 NOTE — Anesthesia Preprocedure Evaluation (Addendum)
Anesthesia Evaluation  Patient identified by MRN, date of birth, ID band Patient awake    Reviewed: Allergy & Precautions, NPO status , Patient's Chart, lab work & pertinent test results  History of Anesthesia Complications Negative for: history of anesthetic complications  Airway Mallampati: IV   Neck ROM: Full    Dental  (+) Upper Dentures, Partial Lower   Pulmonary sleep apnea , former smoker (quit 1997),    Pulmonary exam normal breath sounds clear to auscultation       Cardiovascular hypertension, +CHF  Normal cardiovascular exam+ dysrhythmias (a fib on Eliquis, last dose 03/23/21) + Valvular Problems/Murmurs (moderate) AS  Rhythm:Regular Rate:Normal  ECG 03/06/21:  Atrial fibrillation  Low voltage QRS  Right bundle branch block  Myocardial perfusion 11/21/20:  LVEF= 65%   Regional wall motion: reveals normal myocardial thickening and wall motion.  The overall quality of the study is good.   Artifacts noted: no  Left ventricular cavity: normal.  Perfusion Analysis: SPECT images demonstrate homogeneous tracer distribution throughout the myocardium. Defect type : Normal   Echo 11/21/20:  NORMAL LEFT VENTRICULAR SYSTOLIC FUNCTION  WITH MILD LVH  NORMAL RIGHT VENTRICULAR SYSTOLIC FUNCTION  MODERATE to SEVERE AS  AVA(VTI)= .56cm^2  MILD AR, MR, TR  EF 55%      Neuro/Psych negative neurological ROS     GI/Hepatic negative GI ROS,   Endo/Other  Hypothyroidism Class 3 obesity  Renal/GU Renal disease (stage III CKD)     Musculoskeletal  (+) Arthritis ,   Abdominal   Peds  Hematology negative hematology ROS (+)   Anesthesia Other Findings Reviewed 03/06/21 cardiology note.  Reproductive/Obstetrics                            Anesthesia Physical Anesthesia Plan  ASA: 4  Anesthesia Plan: General   Post-op Pain Management:    Induction: Intravenous  PONV Risk Score and  Plan: 3 and Propofol infusion, TIVA and Treatment may vary due to age or medical condition  Airway Management Planned: Natural Airway  Additional Equipment:   Intra-op Plan:   Post-operative Plan:   Informed Consent: I have reviewed the patients History and Physical, chart, labs and discussed the procedure including the risks, benefits and alternatives for the proposed anesthesia with the patient or authorized representative who has indicated his/her understanding and acceptance.       Plan Discussed with: CRNA  Anesthesia Plan Comments: (Patient consented for risks of anesthesia including but not limited to:  - adverse reactions to medications - damage to eyes, teeth, lips or other oral mucosa - nerve damage due to positioning  - sore throat or hoarseness - damage to heart, brain, nerves, lungs, other parts of body or loss of life  Informed patient about role of CRNA in peri- and intra-operative care.  Patient voiced understanding.)       Anesthesia Quick Evaluation

## 2021-03-23 ENCOUNTER — Ambulatory Visit: Payer: Medicare Other | Admitting: Family Medicine

## 2021-03-23 ENCOUNTER — Encounter
Admission: RE | Disposition: A | Discharge status: Home / Self Care | Payer: Self-pay | Source: Home / Self Care | Attending: Internal Medicine

## 2021-03-23 ENCOUNTER — Ambulatory Visit: Payer: Medicare Other | Admitting: Anesthesiology

## 2021-03-23 ENCOUNTER — Other Ambulatory Visit: Payer: Self-pay

## 2021-03-23 ENCOUNTER — Ambulatory Visit
Admission: RE | Admit: 2021-03-23 | Discharge: 2021-03-23 | Disposition: A | Discharge status: Home / Self Care | Payer: Medicare Other | Source: Home / Self Care | Attending: Internal Medicine | Admitting: Internal Medicine

## 2021-03-23 ENCOUNTER — Ambulatory Visit
Admission: RE | Admit: 2021-03-23 | Discharge: 2021-03-23 | Disposition: A | Discharge status: Home / Self Care | Payer: Medicare Other | Attending: Internal Medicine | Admitting: Internal Medicine

## 2021-03-23 ENCOUNTER — Encounter: Payer: Self-pay | Admitting: Internal Medicine

## 2021-03-23 DIAGNOSIS — Z6841 Body Mass Index (BMI) 40.0 and over, adult: Secondary | ICD-10-CM | POA: Diagnosis not present

## 2021-03-23 DIAGNOSIS — N183 Chronic kidney disease, stage 3 unspecified: Secondary | ICD-10-CM | POA: Diagnosis not present

## 2021-03-23 DIAGNOSIS — Z87891 Personal history of nicotine dependence: Secondary | ICD-10-CM | POA: Diagnosis not present

## 2021-03-23 DIAGNOSIS — I480 Paroxysmal atrial fibrillation: Secondary | ICD-10-CM | POA: Insufficient documentation

## 2021-03-23 DIAGNOSIS — I509 Heart failure, unspecified: Secondary | ICD-10-CM | POA: Insufficient documentation

## 2021-03-23 DIAGNOSIS — I083 Combined rheumatic disorders of mitral, aortic and tricuspid valves: Secondary | ICD-10-CM | POA: Diagnosis not present

## 2021-03-23 DIAGNOSIS — E039 Hypothyroidism, unspecified: Secondary | ICD-10-CM | POA: Diagnosis not present

## 2021-03-23 DIAGNOSIS — R008 Other abnormalities of heart beat: Secondary | ICD-10-CM | POA: Insufficient documentation

## 2021-03-23 DIAGNOSIS — I130 Hypertensive heart and chronic kidney disease with heart failure and stage 1 through stage 4 chronic kidney disease, or unspecified chronic kidney disease: Secondary | ICD-10-CM | POA: Diagnosis not present

## 2021-03-23 DIAGNOSIS — I4891 Unspecified atrial fibrillation: Secondary | ICD-10-CM | POA: Diagnosis not present

## 2021-03-23 DIAGNOSIS — E785 Hyperlipidemia, unspecified: Secondary | ICD-10-CM | POA: Insufficient documentation

## 2021-03-23 DIAGNOSIS — I4510 Unspecified right bundle-branch block: Secondary | ICD-10-CM | POA: Diagnosis not present

## 2021-03-23 DIAGNOSIS — I6359 Cerebral infarction due to unspecified occlusion or stenosis of other cerebral artery: Secondary | ICD-10-CM | POA: Diagnosis not present

## 2021-03-23 HISTORY — DX: Unspecified atrial fibrillation: I48.91

## 2021-03-23 HISTORY — DX: Nonrheumatic aortic (valve) stenosis: I35.0

## 2021-03-23 HISTORY — PX: CARDIOVERSION: SHX1299

## 2021-03-23 HISTORY — PX: TEE WITHOUT CARDIOVERSION: SHX5443

## 2021-03-23 LAB — ECHO TEE
AR max vel: 0.94 cm2
AV Area VTI: 1 cm2
AV Area mean vel: 1.06 cm2
AV Mean grad: 21 mmHg
AV Peak grad: 35.4 mmHg
Ao pk vel: 2.98 m/s

## 2021-03-23 SURGERY — CARDIOVERSION
Anesthesia: General

## 2021-03-23 MED ORDER — SODIUM CHLORIDE FLUSH 0.9 % IV SOLN
INTRAVENOUS | Status: AC
  Filled 2021-03-23: qty 10

## 2021-03-23 MED ORDER — METOPROLOL SUCCINATE ER 50 MG PO TB24: 25.0000 mg | ORAL_TABLET | Freq: Every day | ORAL | 4 refills | Status: DC

## 2021-03-23 MED ORDER — ACETAMINOPHEN 160 MG/5ML PO SOLN
325.0000 mg | ORAL | Status: DC | PRN
  Filled 2021-03-23: qty 20.3

## 2021-03-23 MED ORDER — EPHEDRINE SULFATE 50 MG/ML IJ SOLN
INTRAMUSCULAR | Status: DC | PRN
  Administered 2021-03-23: 5 mg via INTRAVENOUS

## 2021-03-23 MED ORDER — PHENYLEPHRINE HCL (PRESSORS) 10 MG/ML IV SOLN
INTRAVENOUS | Status: DC | PRN
  Administered 2021-03-23 (×3): 100 ug via INTRAVENOUS

## 2021-03-23 MED ORDER — SODIUM CHLORIDE 0.9 % IV SOLN: INTRAVENOUS | Status: DC

## 2021-03-23 MED ORDER — ACETAMINOPHEN 325 MG PO TABS: 650.0000 mg | ORAL_TABLET | Freq: Once | ORAL | Status: DC | PRN

## 2021-03-23 MED ORDER — PROPOFOL 10 MG/ML IV BOLUS
INTRAVENOUS | Status: DC | PRN
  Administered 2021-03-23: 100 mg via INTRAVENOUS
  Administered 2021-03-23: 30 mg via INTRAVENOUS
  Administered 2021-03-23: 20 mg via INTRAVENOUS
  Administered 2021-03-23: 30 mg via INTRAVENOUS
  Administered 2021-03-23: 20 mg via INTRAVENOUS
  Administered 2021-03-23: 50 mg via INTRAVENOUS

## 2021-03-23 MED ORDER — LIDOCAINE VISCOUS HCL 2 % MT SOLN
OROMUCOSAL | Status: AC
  Filled 2021-03-23: qty 15

## 2021-03-23 MED ORDER — BUTAMBEN-TETRACAINE-BENZOCAINE 2-2-14 % EX AERO
INHALATION_SPRAY | CUTANEOUS | Status: AC
  Filled 2021-03-23: qty 5

## 2021-03-23 MED ORDER — ONDANSETRON HCL 4 MG/2ML IJ SOLN: 4.0000 mg | Freq: Once | INTRAMUSCULAR | Status: DC | PRN

## 2021-03-23 NOTE — Transfer of Care (Signed)
Immediate Anesthesia Transfer of Care Note  [name]  Procedure(s) Performed: CARDIOVERSION TRANSESOPHAGEAL ECHOCARDIOGRAM (TEE)  Patient Location: Cath Lab  Anesthesia Type:General  Level of Consciousness: awake, alert  and oriented  Airway & Oxygen Therapy: Patient Spontanous Breathing and Patient connected to face mask oxygen  Post-op Assessment: Report given to RN and Post -op Vital signs reviewed and stable  Post vital signs: Reviewed and stable  Last Vitals:  Vitals Value Taken Time  BP 124/62 03/23/21 0827  Temp    Pulse 50 03/23/21 0830  Resp 17 03/23/21 0830  SpO2 100 % 03/23/21 0830  Vitals shown include unvalidated device data.  Last Pain:  Vitals:   03/23/21 0723  TempSrc: Oral  PainSc: 0-No pain         Complications: No notable events documented.

## 2021-03-23 NOTE — CV Procedure (Signed)
Electrical Cardioversion Procedure Note Mary Kennedy [id number] 1940-12-16  Procedure: Electrical Cardioversion Indications:  Paroxysmal non valvular atrial fibrillation  Procedure Details Consent: Risks of procedure as well as the alternatives and risks of each were explained to the (patient/caregiver).  Consent for procedure obtained. Time Out: Verified patient identification, verified procedure, site/side was marked, verified correct patient position, special equipment/implants available, medications/allergies/relevent history reviewed, required imaging and test results available.  Performed  Patient placed on cardiac monitor, pulse oximetry, supplemental oxygen as necessary.  Sedation given: Propofol and versed as per anesthesia  Pacer pads placed anterior and posterior chest.  Cardioverted 3 time(s).  Cardioverted at [place].  Evaluation Findings: Post procedure EKG shows: NSR Complications: None Patient did tolerate procedure well.   Mary Kennedy M.D. [facility] 03/23/2021, 8:29 AM

## 2021-03-23 NOTE — Anesthesia Postprocedure Evaluation (Signed)
Anesthesia Post Note  [name]  Procedure(s) Performed: CARDIOVERSION TRANSESOPHAGEAL ECHOCARDIOGRAM (TEE)  Patient location during evaluation: PACU Anesthesia Type: General Level of consciousness: awake and alert, oriented and patient cooperative Pain management: pain level controlled Vital Signs Assessment: post-procedure vital signs reviewed and stable Respiratory status: spontaneous breathing, nonlabored ventilation and respiratory function stable Cardiovascular status: blood pressure returned to baseline and stable Postop Assessment: adequate PO intake Anesthetic complications: no   No notable events documented.   Last Vitals:  Vitals:   03/23/21 0823 03/23/21 0824  BP:    Pulse: 64 (!) 44  Resp: 20 (!) 27  Temp:    SpO2: 100% 100%    Last Pain:  Vitals:   03/23/21 0723  TempSrc: Oral  PainSc: 0-No pain                 Darrin Nipper

## 2021-03-23 NOTE — Progress Notes (Signed)
*  PRELIMINARY RESULTS* Echocardiogram Echocardiogram Transesophageal has been performed.  Sherrie Sport 03/23/2021, 8:47 AM

## 2021-03-23 NOTE — Anesthesia Procedure Notes (Signed)
Date/Time: 03/23/2021 7:58 AM Performed by: Lily Peer, Marykathleen Russi, CRNA Pre-anesthesia Checklist: Patient identified, Emergency Drugs available, Suction available, Patient being monitored and Timeout performed Patient Re-evaluated:Patient Re-evaluated prior to induction Oxygen Delivery Method: Simple face mask Induction Type: IV induction

## 2021-03-23 NOTE — CV Procedure (Addendum)
Transesophageal echocardiogram preliminary report  Mary Kennedy [id number] 1940/10/26  Preliminary diagnosis  Atrial fibrillation with or without cardioversion Moderate aortic stenosis with velocity of 3.70m/sec Moderate atrial enlargement with moderate mr and spontaneous contrast Postprocedural diagnosis  Atrial fibrillation without left atrial appendage thrombus  Time out A timeout was performed by the nursing staff and physicians specifically identifying the procedure performed, identification of the patient, the type of sedation, all allergies and medications, all pertinent medical history, and presedation assessment of nasopharynx. The patient and or family understand the risks of the procedure including the rare risks of death, stroke, heart attack, esophogeal perforation, sore throat, and reaction to medications given.  Moderate sedation During this procedure the patient has received  propofol to achieve appropriate moderate sedation.  The patient had continued monitoring of heart rate, oxygenation, blood pressure, respiratory rate, and extent of signs of sedation throughout the entire procedure.  The patient received this moderate sedation over a period of 23 minutes.  Both the nursing staff and I were present during the procedure when the patient had moderate sedation for 100% of the time.  Treatment considerations  Electrical cardioversion of atrial fibrillation due to no evidence of atrial appendage thrombus Continue to monitor aortic stenosis For further details of transesophageal echocardiogram please refer to final report.  Signed,  Corey Skains M.D. [facility] 03/23/2021 8:24 AM

## 2021-03-27 DIAGNOSIS — I5042 Chronic combined systolic (congestive) and diastolic (congestive) heart failure: Secondary | ICD-10-CM | POA: Diagnosis not present

## 2021-03-27 DIAGNOSIS — J449 Chronic obstructive pulmonary disease, unspecified: Secondary | ICD-10-CM | POA: Diagnosis not present

## 2021-03-27 DIAGNOSIS — R600 Localized edema: Secondary | ICD-10-CM | POA: Diagnosis not present

## 2021-03-27 DIAGNOSIS — I350 Nonrheumatic aortic (valve) stenosis: Secondary | ICD-10-CM | POA: Diagnosis not present

## 2021-03-27 DIAGNOSIS — G4733 Obstructive sleep apnea (adult) (pediatric): Secondary | ICD-10-CM | POA: Diagnosis not present

## 2021-03-27 DIAGNOSIS — E782 Mixed hyperlipidemia: Secondary | ICD-10-CM | POA: Diagnosis not present

## 2021-03-27 DIAGNOSIS — I4891 Unspecified atrial fibrillation: Secondary | ICD-10-CM | POA: Diagnosis not present

## 2021-03-27 DIAGNOSIS — N1832 Chronic kidney disease, stage 3b: Secondary | ICD-10-CM | POA: Diagnosis not present

## 2021-03-27 DIAGNOSIS — R0602 Shortness of breath: Secondary | ICD-10-CM | POA: Diagnosis not present

## 2021-03-27 DIAGNOSIS — I10 Essential (primary) hypertension: Secondary | ICD-10-CM | POA: Diagnosis not present

## 2021-04-05 DIAGNOSIS — J439 Emphysema, unspecified: Secondary | ICD-10-CM | POA: Diagnosis not present

## 2021-04-05 DIAGNOSIS — G4733 Obstructive sleep apnea (adult) (pediatric): Secondary | ICD-10-CM | POA: Diagnosis not present

## 2021-04-05 DIAGNOSIS — R0609 Other forms of dyspnea: Secondary | ICD-10-CM | POA: Diagnosis not present

## 2021-04-10 DIAGNOSIS — M5136 Other intervertebral disc degeneration, lumbar region: Secondary | ICD-10-CM | POA: Diagnosis not present

## 2021-04-10 DIAGNOSIS — M9903 Segmental and somatic dysfunction of lumbar region: Secondary | ICD-10-CM | POA: Diagnosis not present

## 2021-04-10 DIAGNOSIS — M62830 Muscle spasm of back: Secondary | ICD-10-CM | POA: Diagnosis not present

## 2021-04-10 DIAGNOSIS — M9902 Segmental and somatic dysfunction of thoracic region: Secondary | ICD-10-CM | POA: Diagnosis not present

## 2021-04-11 DIAGNOSIS — M9903 Segmental and somatic dysfunction of lumbar region: Secondary | ICD-10-CM | POA: Diagnosis not present

## 2021-04-11 DIAGNOSIS — M9902 Segmental and somatic dysfunction of thoracic region: Secondary | ICD-10-CM | POA: Diagnosis not present

## 2021-04-11 DIAGNOSIS — M62830 Muscle spasm of back: Secondary | ICD-10-CM | POA: Diagnosis not present

## 2021-04-11 DIAGNOSIS — M5136 Other intervertebral disc degeneration, lumbar region: Secondary | ICD-10-CM | POA: Diagnosis not present

## 2021-04-12 DIAGNOSIS — M62830 Muscle spasm of back: Secondary | ICD-10-CM | POA: Diagnosis not present

## 2021-04-12 DIAGNOSIS — M9902 Segmental and somatic dysfunction of thoracic region: Secondary | ICD-10-CM | POA: Diagnosis not present

## 2021-04-12 DIAGNOSIS — M9903 Segmental and somatic dysfunction of lumbar region: Secondary | ICD-10-CM | POA: Diagnosis not present

## 2021-04-12 DIAGNOSIS — M5136 Other intervertebral disc degeneration, lumbar region: Secondary | ICD-10-CM | POA: Diagnosis not present

## 2021-04-14 ENCOUNTER — Ambulatory Visit
Admission: RE | Admit: 2021-04-14 | Discharge: 2021-04-14 | Disposition: A | Discharge status: Home / Self Care | Payer: Medicare Other | Attending: Internal Medicine | Admitting: Internal Medicine

## 2021-04-14 ENCOUNTER — Encounter
Admission: RE | Disposition: A | Discharge status: Home / Self Care | Payer: Self-pay | Source: Home / Self Care | Attending: Internal Medicine

## 2021-04-14 ENCOUNTER — Encounter: Payer: Self-pay | Admitting: Internal Medicine

## 2021-04-14 ENCOUNTER — Other Ambulatory Visit: Payer: Self-pay

## 2021-04-14 DIAGNOSIS — I509 Heart failure, unspecified: Secondary | ICD-10-CM | POA: Insufficient documentation

## 2021-04-14 DIAGNOSIS — I350 Nonrheumatic aortic (valve) stenosis: Secondary | ICD-10-CM | POA: Diagnosis not present

## 2021-04-14 DIAGNOSIS — I272 Pulmonary hypertension, unspecified: Secondary | ICD-10-CM | POA: Diagnosis not present

## 2021-04-14 HISTORY — PX: RIGHT/LEFT HEART CATH AND CORONARY ANGIOGRAPHY: CATH118266

## 2021-04-14 LAB — BASIC METABOLIC PANEL
Anion gap: 8 (ref 5–15)
BUN: 48 mg/dL — ABNORMAL HIGH (ref 8–23)
CO2: 27 mmol/L (ref 22–32)
Calcium: 9.5 mg/dL (ref 8.9–10.3)
Chloride: 102 mmol/L (ref 98–111)
Creatinine, Ser: 2.2 mg/dL — ABNORMAL HIGH (ref 0.44–1.00)
GFR, Estimated: 22 mL/min — ABNORMAL LOW (ref >60)
Glucose, Bld: 102 mg/dL — ABNORMAL HIGH (ref 70–99)
Potassium: 4.5 mmol/L (ref 3.5–5.1)
Sodium: 137 mmol/L (ref 135–145)

## 2021-04-14 SURGERY — RIGHT/LEFT HEART CATH AND CORONARY ANGIOGRAPHY
Anesthesia: Moderate Sedation

## 2021-04-14 MED ORDER — MIDAZOLAM HCL 2 MG/2ML IJ SOLN
INTRAMUSCULAR | Status: AC
  Filled 2021-04-14: qty 2

## 2021-04-14 MED ORDER — ONDANSETRON HCL 4 MG/2ML IJ SOLN: 4.0000 mg | Freq: Four times a day (QID) | INTRAMUSCULAR | Status: DC | PRN

## 2021-04-14 MED ORDER — HEPARIN (PORCINE) IN NACL 1000-0.9 UT/500ML-% IV SOLN
INTRAVENOUS | Status: DC | PRN
  Administered 2021-04-14: 500 mL

## 2021-04-14 MED ORDER — SODIUM CHLORIDE 0.9 % WEIGHT BASED INFUSION: 3.0000 mL/kg/h | INTRAVENOUS | Status: AC

## 2021-04-14 MED ORDER — SODIUM CHLORIDE 0.9 % IV SOLN: 250.0000 mL | INTRAVENOUS | Status: DC | PRN

## 2021-04-14 MED ORDER — FENTANYL CITRATE (PF) 100 MCG/2ML IJ SOLN
INTRAMUSCULAR | Status: AC
  Filled 2021-04-14: qty 2

## 2021-04-14 MED ORDER — SODIUM CHLORIDE 0.9% FLUSH: 3.0000 mL | INTRAVENOUS | Status: DC | PRN

## 2021-04-14 MED ORDER — FENTANYL CITRATE (PF) 100 MCG/2ML IJ SOLN
INTRAMUSCULAR | Status: DC | PRN
  Administered 2021-04-14: 25 ug via INTRAVENOUS

## 2021-04-14 MED ORDER — LABETALOL HCL 5 MG/ML IV SOLN: 10.0000 mg | INTRAVENOUS | Status: DC | PRN

## 2021-04-14 MED ORDER — MIDAZOLAM HCL 2 MG/2ML IJ SOLN
INTRAMUSCULAR | Status: DC | PRN
  Administered 2021-04-14: 1 mg via INTRAVENOUS

## 2021-04-14 MED ORDER — ASPIRIN 81 MG PO CHEW
CHEWABLE_TABLET | ORAL | Status: AC
  Administered 2021-04-14: 81 mg via ORAL
  Filled 2021-04-14: qty 1

## 2021-04-14 MED ORDER — ACETAMINOPHEN 325 MG PO TABS: 650.0000 mg | ORAL_TABLET | ORAL | Status: DC | PRN

## 2021-04-14 MED ORDER — SODIUM CHLORIDE 0.9% FLUSH: 3.0000 mL | Freq: Two times a day (BID) | INTRAVENOUS | Status: DC

## 2021-04-14 MED ORDER — SODIUM CHLORIDE 0.9 % WEIGHT BASED INFUSION
1.0000 mL/kg/h | INTRAVENOUS | Status: DC
  Administered 2021-04-14: 500 mL via INTRAVENOUS

## 2021-04-14 MED ORDER — SODIUM CHLORIDE 0.9 % IV BOLUS
INTRAVENOUS | Status: DC | PRN
  Administered 2021-04-14: 500 mL via INTRAVENOUS

## 2021-04-14 MED ORDER — HYDRALAZINE HCL 20 MG/ML IJ SOLN: 10.0000 mg | INTRAMUSCULAR | Status: DC | PRN

## 2021-04-14 MED ORDER — HEPARIN (PORCINE) IN NACL 1000-0.9 UT/500ML-% IV SOLN
INTRAVENOUS | Status: AC
  Filled 2021-04-14: qty 1000

## 2021-04-14 MED ORDER — ASPIRIN 81 MG PO CHEW: 81.0000 mg | CHEWABLE_TABLET | ORAL | Status: AC

## 2021-04-14 MED ORDER — SODIUM CHLORIDE 0.9 % WEIGHT BASED INFUSION: 1.0000 mL/kg/h | INTRAVENOUS | Status: DC

## 2021-04-14 MED ORDER — IOHEXOL 350 MG/ML SOLN
INTRAVENOUS | Status: DC | PRN
  Administered 2021-04-14: 21 mL

## 2021-04-14 MED ORDER — LIDOCAINE HCL (PF) 1 % IJ SOLN
INTRAMUSCULAR | Status: DC | PRN
  Administered 2021-04-14: 20 mL

## 2021-04-14 MED ORDER — LIDOCAINE HCL 1 % IJ SOLN
INTRAMUSCULAR | Status: AC
  Filled 2021-04-14: qty 20

## 2021-04-14 SURGICAL SUPPLY — 13 items
CATH INFINITI 5FR JL4 (CATHETERS) ×2 IMPLANT
CATH INFINITI JR4 5F (CATHETERS) ×2 IMPLANT
CATH SWAN GANZ 7F STRAIGHT (CATHETERS) ×2 IMPLANT
DEVICE CLOSURE MYNXGRIP 5F (Vascular Products) ×2 IMPLANT
NEEDLE PERC 18GX7CM (NEEDLE) ×2 IMPLANT
PACK CARDIAC CATH (CUSTOM PROCEDURE TRAY) ×2 IMPLANT
PANNUS RETENTION SYSTEM 2 PAD (MISCELLANEOUS) ×2 IMPLANT
PROTECTION STATION PRESSURIZED (MISCELLANEOUS) ×2
SET ATX SIMPLICITY (MISCELLANEOUS) ×2 IMPLANT
SHEATH AVANTI 5FR X 11CM (SHEATH) ×2 IMPLANT
SHEATH AVANTI 7FRX11 (SHEATH) ×2 IMPLANT
STATION PROTECTION PRESSURIZED (MISCELLANEOUS) ×1 IMPLANT
WIRE GUIDERIGHT .035X150 (WIRE) ×2 IMPLANT

## 2021-04-14 NOTE — Progress Notes (Signed)
PT came back with fluids running 235ml/hr. Dr. Clayborn Bigness ordered to continue for and additional 500cc and then pt can go home.

## 2021-04-17 ENCOUNTER — Encounter: Payer: Self-pay | Admitting: Internal Medicine

## 2021-04-17 DIAGNOSIS — Z09 Encounter for follow-up examination after completed treatment for conditions other than malignant neoplasm: Secondary | ICD-10-CM | POA: Diagnosis not present

## 2021-04-17 DIAGNOSIS — R5381 Other malaise: Secondary | ICD-10-CM | POA: Diagnosis not present

## 2021-04-17 DIAGNOSIS — J449 Chronic obstructive pulmonary disease, unspecified: Secondary | ICD-10-CM | POA: Diagnosis not present

## 2021-04-17 DIAGNOSIS — I340 Nonrheumatic mitral (valve) insufficiency: Secondary | ICD-10-CM | POA: Diagnosis not present

## 2021-04-17 DIAGNOSIS — I350 Nonrheumatic aortic (valve) stenosis: Secondary | ICD-10-CM | POA: Diagnosis not present

## 2021-04-17 DIAGNOSIS — Z79899 Other long term (current) drug therapy: Secondary | ICD-10-CM | POA: Diagnosis not present

## 2021-04-17 DIAGNOSIS — I480 Paroxysmal atrial fibrillation: Secondary | ICD-10-CM | POA: Diagnosis not present

## 2021-04-17 DIAGNOSIS — R0602 Shortness of breath: Secondary | ICD-10-CM | POA: Diagnosis not present

## 2021-04-17 DIAGNOSIS — N183 Chronic kidney disease, stage 3 unspecified: Secondary | ICD-10-CM | POA: Diagnosis not present

## 2021-04-17 DIAGNOSIS — I10 Essential (primary) hypertension: Secondary | ICD-10-CM | POA: Diagnosis not present

## 2021-05-02 DIAGNOSIS — R82998 Other abnormal findings in urine: Secondary | ICD-10-CM | POA: Diagnosis not present

## 2021-05-02 DIAGNOSIS — R801 Persistent proteinuria, unspecified: Secondary | ICD-10-CM | POA: Diagnosis not present

## 2021-05-02 DIAGNOSIS — N1832 Chronic kidney disease, stage 3b: Secondary | ICD-10-CM | POA: Diagnosis not present

## 2021-05-02 DIAGNOSIS — R600 Localized edema: Secondary | ICD-10-CM | POA: Diagnosis not present

## 2021-05-02 DIAGNOSIS — I10 Essential (primary) hypertension: Secondary | ICD-10-CM | POA: Diagnosis not present

## 2021-05-17 ENCOUNTER — Other Ambulatory Visit: Payer: Self-pay | Admitting: Family Medicine

## 2021-05-17 ENCOUNTER — Telehealth: Payer: Self-pay

## 2021-05-17 DIAGNOSIS — I10 Essential (primary) hypertension: Secondary | ICD-10-CM

## 2021-05-17 NOTE — Telephone Encounter (Signed)
Copied from [place] [phone]. Topic: Quick Communication - Rx Refill/Question >> May 17, 2021 10:48 AM Erick Blinks wrote: Pt called to report that she has been receiving too many medications from [facility], she has 3 bottles of losartan and does not need any more. She has not requested these refills, she has been trying to get them to stop but they will not she says.   Best contact: [phone]

## 2021-05-17 NOTE — Telephone Encounter (Signed)
Noted. CMA Madison Hickman called to canceled prescription for the Losartan that was sent in today.

## 2021-05-30 NOTE — Progress Notes (Signed)
I,Sha'taria Tyson,acting as a Education administrator for Lavon Paganini, MD.,have documented all relevant documentation on the behalf of Lavon Paganini, MD,as directed by  Lavon Paganini, MD while in the presence of Lavon Paganini, MD.  Annual Wellness Visit     [name], Female    DOB: Jan 08, 1941, 81 y.o.   MRN: [id number] Visit Date: 06/02/2021  Today's Provider: Lavon Paganini, MD   Chief Complaint  Patient presents with   Annual Exam   Subjective    Mary Kennedy is a 81 y.o. female who presents today for her Annual Wellness Visit. She reports consuming a low fat and low sodium diet. The patient does not participate in regular exercise at present. She generally feels fairly well. She reports sleeping poorly. She does have additional problems to discuss today.   HPI States she has difficulty breathing, she has a lot of phlegm in the morning when she gets. States she will discuss with pulmonologist. They originally gave her steroids that she has completed and does not believe it helped at all. Also stated she is not sleeping well. States her face is breaking out and wants to know what to do about it   Rash on face for >1 yr ago - feels like its from wearing her mask. Red, raised bumps, not itchy. Tried clinique and loreal creams.   Medications: Outpatient Medications Prior to Visit  Medication Sig   acetaminophen (TYLENOL) 650 MG CR tablet Take 650 mg by mouth every 8 (eight) hours as needed for pain.   albuterol (VENTOLIN HFA) 108 (90 Base) MCG/ACT inhaler Inhale 2 puffs into the lungs every 6 (six) hours as needed for wheezing or shortness of breath.   amiodarone (PACERONE) 400 MG tablet Take 200 mg by mouth daily in the afternoon.   apixaban (ELIQUIS) 5 MG TABS tablet Take 5 mg by mouth 2 (two) times daily.   atorvastatin (LIPITOR) 20 MG tablet Take 1 tablet (20 mg total) by mouth daily. (Patient taking differently: Take 20 mg by mouth at bedtime.)    Calcium Carb-Cholecalciferol (CALCIUM 600+D) 600-800 MG-UNIT TABS Take 2 tablets by mouth every evening.   Cholecalciferol (VITAMIN D3) 50 MCG (2000 UT) capsule Take 2,000 Units by mouth daily.   furosemide (LASIX) 20 MG tablet Take 1 tablet (20 mg total) by mouth daily.   levothyroxine (SYNTHROID) 125 MCG tablet TAKE 1 TABLET BY MOUTH  DAILY BEFORE BREAKFAST   losartan (COZAAR) 100 MG tablet TAKE 1 TABLET BY MOUTH  DAILY   metoprolol succinate (TOPROL-XL) 50 MG 24 hr tablet Take 0.5 tablets (25 mg total) by mouth daily.   MULTIPLE VITAMIN PO Take 1 tablet by mouth daily. Centrum 50 +   Multiple Vitamins-Minerals (OCUVITE ADULT 50+) CAPS Take 1 capsule by mouth daily.   OVER THE COUNTER MEDICATION Take 1 Scoop by mouth daily. Super Beets in glass of water 8 oz   potassium chloride SA (KLOR-CON) 20 MEQ tablet TAKE 1 TABLET BY MOUTH  DAILY WITH LASIX   triamcinolone (KENALOG) 0.1 % APPLY TOPICALLY TWO TIMES  DAILY   Zinc 50 MG CAPS Take 50 mg by mouth daily.   hydrochlorothiazide (HYDRODIURIL) 12.5 MG tablet Take 1 tablet (12.5 mg total) by mouth daily. (Patient not taking: Reported on 06/02/2021)   [DISCONTINUED] MAGNESIUM MALATE PO Take 1,000 mg by mouth at bedtime. (Patient not taking: Reported on 06/02/2021)   [DISCONTINUED] Menthol (ICY HOT) 5 % PTCH Apply 1 patch topically daily as needed (pain). (Patient not taking: Reported  on 06/02/2021)   [DISCONTINUED] Menthol-Methyl Salicylate (ICY HOT ORIGINAL PAIN RELIEF) 10-30 % CREA Apply 1 application topically daily as needed (pain). (Patient not taking: Reported on 06/02/2021)   [DISCONTINUED] sodium chloride (OCEAN) 0.65 % SOLN nasal spray Place 1 spray into both nostrils daily as needed (Dry Nose). (Patient not taking: Reported on 04/14/2021)   [DISCONTINUED] spironolactone (ALDACTONE) 25 MG tablet Take 25 mg by mouth daily. (Patient not taking: Reported on 06/02/2021)   No facility-administered medications prior to visit.    Allergies  Allergen  Reactions   Lisinopril Anaphylaxis, Swelling and Cough    Close airway   Other Swelling    Persimmon causes mouth and throat swelling   Tegaderm Ag Mesh 2"X2"  [Wound Dressings]     when removing the patch her skin came off.   Wound Dressing Adhesive     Adhesive from covering for PICC line tore skin   Ciprofloxacin Hives and Rash   Latex Rash    Adhesive from covering for PICC line tore skin   Penicillins Swelling and Rash    Patient Care Team: Mikey Kirschner, PA-C as PCP - General (Physician Assistant) Birder Robson, MD as Referring Physician (Ophthalmology) Yolonda Kida, MD as Consulting Physician (Cardiology) Iris Pert, [place] as Referring Physician (Chiropractic Medicine)  Review of Systems  All other systems reviewed and are negative.  Last CBC Lab Results  Component Value Date   WBC 6.7 10/19/2019   HGB 13.4 10/19/2019   HCT 40.8 10/19/2019   MCV 97 10/19/2019   MCH 32.0 10/19/2019   RDW 13.0 10/19/2019   PLT 245 63/87/5643   Last metabolic panel Lab Results  Component Value Date   GLUCOSE 102 (H) 04/14/2021   NA 137 04/14/2021   K 4.5 04/14/2021   CL 102 04/14/2021   CO2 27 04/14/2021   BUN 48 (H) 04/14/2021   CREATININE 2.20 (H) 04/14/2021   GFRNONAA 22 (L) 04/14/2021   CALCIUM 9.5 04/14/2021   PHOS 4.2 12/03/2019   PROT 6.5 09/21/2020   ALBUMIN 4.1 09/21/2020   LABGLOB 2.4 09/21/2020   AGRATIO 1.7 09/21/2020   BILITOT 0.7 09/21/2020   ALKPHOS 74 09/21/2020   AST 24 09/21/2020   ALT 21 09/21/2020   ANIONGAP 8 04/14/2021   Last lipids Lab Results  Component Value Date   CHOL 157 09/21/2020   HDL 46 09/21/2020   LDLCALC 95 09/21/2020   TRIG 85 09/21/2020   CHOLHDL 3.4 09/21/2020   Last hemoglobin A1c Lab Results  Component Value Date   HGBA1C 5.5 10/19/2019   Last thyroid functions Lab Results  Component Value Date   TSH 4.900 (H) 09/21/2020        Objective    Vitals: BP 130/70 Comment: home reading   Pulse (!) 56     Ht 5\' 3"  (1.6 m)    Wt 238 lb 8 oz (108.2 kg)    SpO2 97%    BMI 42.25 kg/m  BP Readings from Last 3 Encounters:  06/02/21 130/70  04/14/21 (!) 144/80  03/23/21 111/62   Wt Readings from Last 3 Encounters:  06/02/21 238 lb 8 oz (108.2 kg)  04/14/21 274 lb (124.3 kg)  03/23/21 271 lb 8 oz (123.2 kg)      Physical Exam Vitals reviewed.  Constitutional:      General: She is not in acute distress.    Appearance: Normal appearance. She is well-developed. She is not diaphoretic.  HENT:     Head: Normocephalic and atraumatic.  Eyes:     General: No scleral icterus.    Conjunctiva/sclera: Conjunctivae normal.  Neck:     Thyroid: No thyromegaly.  Cardiovascular:     Rate and Rhythm: Normal rate and regular rhythm.     Heart sounds: Murmur heard.  Pulmonary:     Effort: Pulmonary effort is normal. No respiratory distress.     Breath sounds: Normal breath sounds. No wheezing, rhonchi or rales.  Musculoskeletal:     Cervical back: Neck supple.     Right lower leg: Edema present.     Left lower leg: Edema present.  Lymphadenopathy:     Cervical: No cervical adenopathy.  Skin:    General: Skin is warm and dry.     Findings: Rash (erythematous papules of face, telangectasias) present.  Neurological:     Mental Status: She is alert and oriented to person, place, and time. Mental status is at baseline.  Psychiatric:        Mood and Affect: Mood normal.        Behavior: Behavior normal.     Most recent functional status assessment: In your present state of health, do you have any difficulty performing the following activities: 06/02/2021  Hearing? N  Vision? N  Difficulty concentrating or making decisions? N  Walking or climbing stairs? Y  Comment -  Dressing or bathing? Y  Doing errands, shopping? Y  Some recent data might be hidden   Most recent fall risk assessment: Fall Risk  06/02/2021  Falls in the past year? 0  Number falls in past yr: 0  Injury with Fall? 0  Risk  for fall due to : No Fall Risks  Follow up -    Most recent depression screenings: PHQ 2/9 Scores 06/02/2021 09/19/2020  PHQ - 2 Score 1 2  PHQ- 9 Score 7 6   Most recent cognitive screening: 6CIT Screen 06/30/2018  What Year? 0 points  What month? 0 points  What time? 0 points  Count back from 20 0 points  Months in reverse 2 points  Repeat phrase 0 points  Total Score 2   Most recent Audit-C alcohol use screening Alcohol Use Disorder Test (AUDIT) 06/02/2021  1. How often do you have a drink containing alcohol? 0  2. How many drinks containing alcohol do you have on a typical day when you are drinking? 0  3. How often do you have six or more drinks on one occasion? 0  AUDIT-C Score 0   A score of 3 or more in women, and 4 or more in men indicates increased risk for alcohol abuse, EXCEPT if all of the points are from question 1   No results found for any visits on 06/02/21.  Assessment & Plan     Annual wellness visit done today including the all of the following: Reviewed patient's Family Medical History Reviewed and updated list of patient's medical providers Assessment of cognitive impairment was done Assessed patient's functional ability Established a written schedule for health screening [place] Completed and Reviewed  Exercise Activities and Dietary recommendations  Goals      Exercise 3x per week (30 min per time)     Recommend some form of exercise 3 days a week for at least 30 minutes. Pt to start swimming and doing eccentrics for 3-4 days a week.   06/30/18: Continue trying to exercise 3 days a week for at least 30 minutes at a time. Pt to start riding her recumbent  bike 3 times a week for at least 30 minutes at a time.         Immunization History  Administered Date(s) Administered   Fluad Quad(high Dose 65+) 02/02/2020   Influenza Split 02/19/2007, 02/27/2011   Influenza, High Dose Seasonal PF 02/09/2014, 03/15/2016, 01/14/2019    Influenza-Unspecified 03/04/2015, 03/05/2017, 02/10/2018   PFIZER(Purple Top)SARS-COV-2 Vaccination 06/18/2019, 07/09/2019, 02/22/2020   Pneumococcal Conjugate-13 03/22/2014   Pneumococcal Polysaccharide-23 11/03/2012   Tdap 06/20/2010   Zoster Recombinat (Shingrix) 03/18/2020, 05/20/2020   Zoster, Live 06/20/2010    Health Maintenance  Topic Date Due   Hepatitis C Screening  Never done   COVID-19 Vaccine (4 - Booster for Pfizer series) 04/18/2020   TETANUS/TDAP  06/20/2020   Pneumonia Vaccine 59+ Years old  Completed   INFLUENZA VACCINE  Completed   DEXA SCAN  Completed   Zoster Vaccines- Shingrix  Completed   HPV VACCINES  Aged Out     Discussed health benefits of physical activity, and encouraged her to engage in regular exercise appropriate for her age and condition.    Problem List Items Addressed This Visit       Cardiovascular and Mediastinum   Benign hypertension with CKD (chronic kidney disease) stage III (HCC)    Well controlled on home readings Continue current medications Reviewed recent metabolic panel F/u in 6 months         Endocrine   Adult hypothyroidism    Previously well controlled Continue Synthroid at current dose  Recheck TSH and adjust Synthroid as indicated        Relevant Orders   TSH     Musculoskeletal and Integument   Rosacea    Longstanding, but worsening Never used any treatment before Will start metro gel        Genitourinary   Stage 3b chronic kidney disease ([place])    F/b nephrology Last BMP back to baseline Continue to monitor        Other   Hyperlipidemia    Previously well controlled Continue statin Repeat FLP and CMP      Relevant Orders   Lipid Panel With LDL/HDL Ratio   Hepatic function panel   Morbid obesity ([place])    Discussed importance of healthy weight management Discussed diet and exercise       Other Visit Diagnoses     Encounter for Medicare annual wellness exam    -  Primary   Encounter for  annual physical exam       Relevant Orders   Hepatitis C Antibody   Hepatic function panel   Hemoglobin A1c   BMI 40.0-44.9, adult ([place])       Need for hepatitis C screening test       Relevant Orders   Hepatitis C Antibody   Hyperglycemia       Relevant Orders   Hemoglobin A1c   Screening mammogram for breast cancer       Relevant Orders   MM 3D SCREEN BREAST BILATERAL        Return in about 6 months (around 11/30/2021) for chronic disease f/u, With new PCP.     I, Lavon Paganini, MD, have reviewed all documentation for this visit. The documentation on 06/02/21 for the exam, diagnosis, procedures, and orders are all accurate and complete.   Joseph Bias, Dionne Bucy, MD, MPH [facility]

## 2021-06-02 ENCOUNTER — Encounter: Payer: Self-pay | Admitting: Family Medicine

## 2021-06-02 ENCOUNTER — Ambulatory Visit (INDEPENDENT_AMBULATORY_CARE_PROVIDER_SITE_OTHER): Payer: Medicare Other | Admitting: Family Medicine

## 2021-06-02 ENCOUNTER — Other Ambulatory Visit: Payer: Self-pay

## 2021-06-02 VITALS — BP 130/70 | HR 56 | Ht 63.0 in | Wt 238.5 lb

## 2021-06-02 DIAGNOSIS — L719 Rosacea, unspecified: Secondary | ICD-10-CM

## 2021-06-02 DIAGNOSIS — Z6841 Body Mass Index (BMI) 40.0 and over, adult: Secondary | ICD-10-CM | POA: Diagnosis not present

## 2021-06-02 DIAGNOSIS — N183 Chronic kidney disease, stage 3 unspecified: Secondary | ICD-10-CM

## 2021-06-02 DIAGNOSIS — Z1159 Encounter for screening for other viral diseases: Secondary | ICD-10-CM | POA: Diagnosis not present

## 2021-06-02 DIAGNOSIS — E039 Hypothyroidism, unspecified: Secondary | ICD-10-CM

## 2021-06-02 DIAGNOSIS — R739 Hyperglycemia, unspecified: Secondary | ICD-10-CM

## 2021-06-02 DIAGNOSIS — Z0000 Encounter for general adult medical examination without abnormal findings: Secondary | ICD-10-CM

## 2021-06-02 DIAGNOSIS — N1832 Chronic kidney disease, stage 3b: Secondary | ICD-10-CM

## 2021-06-02 DIAGNOSIS — I129 Hypertensive chronic kidney disease with stage 1 through stage 4 chronic kidney disease, or unspecified chronic kidney disease: Secondary | ICD-10-CM | POA: Diagnosis not present

## 2021-06-02 DIAGNOSIS — E7800 Pure hypercholesterolemia, unspecified: Secondary | ICD-10-CM

## 2021-06-02 DIAGNOSIS — Z1231 Encounter for screening mammogram for malignant neoplasm of breast: Secondary | ICD-10-CM

## 2021-06-02 MED ORDER — METRONIDAZOLE 0.75 % EX GEL: 1.0000 "application " | Freq: Two times a day (BID) | CUTANEOUS | 3 refills | Status: DC

## 2021-06-02 NOTE — Assessment & Plan Note (Signed)
Previously well controlled Continue statin Repeat FLP and CMP  

## 2021-06-02 NOTE — Assessment & Plan Note (Signed)
Discussed importance of healthy weight management Discussed diet and exercise  

## 2021-06-02 NOTE — Assessment & Plan Note (Signed)
Longstanding, but worsening Never used any treatment before Will start metro gel

## 2021-06-02 NOTE — Patient Instructions (Addendum)
Try cetaphil or cerave cleanser and facial lotion daily  Ask at the pharmacy about a TDAP (tetanus vaccine)

## 2021-06-02 NOTE — Assessment & Plan Note (Signed)
Previously well controlled Continue Synthroid at current dose  Recheck TSH and adjust Synthroid as indicated   

## 2021-06-02 NOTE — Assessment & Plan Note (Addendum)
Well controlled on home readings Continue current medications Reviewed recent metabolic panel F/u in 6 months  

## 2021-06-02 NOTE — Assessment & Plan Note (Signed)
F/b nephrology Last BMP back to baseline Continue to monitor

## 2021-06-06 LAB — LIPID PANEL WITH LDL/HDL RATIO
Cholesterol, Total: 155 mg/dL (ref 100–199)
HDL: 45 mg/dL (ref >39)
LDL Chol Calc (NIH): 96 mg/dL (ref 0–99)
LDL/HDL Ratio: 2.1 ratio (ref 0.0–3.2)
Triglycerides: 69 mg/dL (ref 0–149)
VLDL Cholesterol Cal: 14 mg/dL (ref 5–40)

## 2021-06-06 LAB — HEPATIC FUNCTION PANEL
ALT: 24 IU/L (ref 0–32)
AST: 20 IU/L (ref 0–40)
Albumin: 4.2 g/dL (ref 3.7–4.7)
Alkaline Phosphatase: 66 IU/L (ref 44–121)
Bilirubin Total: 1 mg/dL (ref 0.0–1.2)
Bilirubin, Direct: 0.3 mg/dL (ref 0.00–0.40)
Total Protein: 6.2 g/dL (ref 6.0–8.5)

## 2021-06-06 LAB — HEMOGLOBIN A1C
Est. average glucose Bld gHb Est-mCnc: 108 mg/dL
Hgb A1c MFr Bld: 5.4 % (ref 4.8–5.6)

## 2021-06-06 LAB — TSH: TSH: 4.04 u[IU]/mL (ref 0.450–4.500)

## 2021-06-06 LAB — HEPATITIS C ANTIBODY: Hep C Virus Ab: <0.1 s/co ratio (ref 0.0–0.9)

## 2021-06-27 ENCOUNTER — Ambulatory Visit: Payer: Self-pay

## 2021-06-27 NOTE — Telephone Encounter (Signed)
Chief Complaint: Red bump to lower right leg Symptoms: Red, hot, size 50 cent piece Frequency: Onset 3-4 days ago Pertinent Negatives: Patient denies other symptoms Disposition: [] ED /[] Urgent Care (no appt availability in office) / [x] Appointment(In office/virtual)/ []  [place] [facility]/ [] Home Care/ [] Refused Recommended Disposition /[] [place] [facility]/ []  Follow-up with PCP Additional Notes: N/A   Reason for Disposition  [1] Small swelling or lump AND [2] unexplained AND [3] present > 1 week  Answer Assessment - Initial Assessment Questions 1. APPEARANCE of SWELLING: "What does it look like?" (e.g., lymph node, insect bite, mole)     Red, hot 2. SIZE: "How large is the swelling?" (e.g., inches, cm; or compare to size of pinhead, tip of pen, eraser, coin, pea, grape, ping pong ball)      50 cent piece 3. LOCATION: "Where is the swelling located?"     Shin of right leg 4. ONSET: "When did the swelling start?"     Swelling chronic to legs, red area started 3-4 days ago 5. PAIN: "Is it painful?" If Yes, ask: "How much?"     No 6. ITCH: "Does it itch?" If Yes, ask: "How much?"     No 7. CAUSE: "What do you think caused the swelling?"     N/A 8. OTHER SYMPTOMS: "Do you have any other symptoms?" (e.g., fever)     No  Protocols used: Skin Lump or Localized Swelling-A-AH

## 2021-06-29 ENCOUNTER — Encounter: Payer: Self-pay | Admitting: Physician Assistant

## 2021-06-29 ENCOUNTER — Ambulatory Visit (INDEPENDENT_AMBULATORY_CARE_PROVIDER_SITE_OTHER): Payer: Medicare Other | Admitting: Physician Assistant

## 2021-06-29 ENCOUNTER — Other Ambulatory Visit: Payer: Self-pay

## 2021-06-29 VITALS — BP 150/47 | HR 46 | Temp 98.6°F | Ht 63.0 in | Wt 271.0 lb

## 2021-06-29 DIAGNOSIS — L03115 Cellulitis of right lower limb: Secondary | ICD-10-CM | POA: Diagnosis not present

## 2021-06-29 MED ORDER — SULFAMETHOXAZOLE-TRIMETHOPRIM 800-160 MG PO TABS: 1.0000 | ORAL_TABLET | Freq: Two times a day (BID) | ORAL | 0 refills | Status: DC

## 2021-06-29 NOTE — Progress Notes (Addendum)
I,Mary Kennedy,acting as a Education administrator for [employer], PA-C.,have documented all relevant documentation on the behalf of Mary Kirschner, PA-C,as directed by  Mary Kirschner, PA-C while in the presence of Mary Kirschner, PA-C.   Established patient visit   [name]   DOB: 26-Apr-1941   81 y.o. Female  MRN: [id number] Visit Date: 06/29/2021  Today's healthcare provider: Mikey Kirschner, PA-C   Cc. Erythema right leg x 5 days  Subjective    HPI  Mary Kennedy is a 81 y/o female with PMH aortic stenosis, paroxysmal afib, systolic and diastolic congestive heart failure, bilateral leg edema who presents today with redness and warmth to her right lower extremity. She reports last week having an increase in swelling, was sent 5 days of an extra diuretic from her cardiologist. Reports her leg swelling is improved but the erythema has worsened. Denies any pain, fevers, chills. Reports feeling fatigued but often feels fatigued from heart failure.    Medications: Outpatient Medications Prior to Visit  Medication Sig   acetaminophen (TYLENOL) 650 MG CR tablet Take 650 mg by mouth every 8 (eight) hours as needed for pain.   albuterol (VENTOLIN HFA) 108 (90 Base) MCG/ACT inhaler Inhale 2 puffs into the lungs every 6 (six) hours as needed for wheezing or shortness of breath.   amiodarone (PACERONE) 400 MG tablet Take 200 mg by mouth daily in the afternoon.   apixaban (ELIQUIS) 5 MG TABS tablet Take 5 mg by mouth 2 (two) times daily.   atorvastatin (LIPITOR) 20 MG tablet Take 1 tablet (20 mg total) by mouth daily. (Patient taking differently: Take 20 mg by mouth at bedtime.)   Calcium Carb-Cholecalciferol (CALCIUM 600+D) 600-800 MG-UNIT TABS Take 2 tablets by mouth every evening.   Cholecalciferol (VITAMIN D3) 50 MCG (2000 UT) capsule Take 2,000 Units by mouth daily.   furosemide (LASIX) 20 MG tablet Take 1 tablet (20 mg total) by mouth daily.   hydrochlorothiazide (HYDRODIURIL) 12.5 MG  tablet Take 1 tablet (12.5 mg total) by mouth daily.   levothyroxine (SYNTHROID) 125 MCG tablet TAKE 1 TABLET BY MOUTH  DAILY BEFORE BREAKFAST   losartan (COZAAR) 100 MG tablet TAKE 1 TABLET BY MOUTH  DAILY   metoprolol succinate (TOPROL-XL) 50 MG 24 hr tablet Take 0.5 tablets (25 mg total) by mouth daily.   metroNIDAZOLE (METROGEL) 0.75 % gel Apply 1 application topically 2 (two) times daily.   MULTIPLE VITAMIN PO Take 1 tablet by mouth daily. Centrum 50 +   Multiple Vitamins-Minerals (OCUVITE ADULT 50+) CAPS Take 1 capsule by mouth daily.   OVER THE COUNTER MEDICATION Take 1 Scoop by mouth daily. Super Beets in glass of water 8 oz   potassium chloride SA (KLOR-CON) 20 MEQ tablet TAKE 1 TABLET BY MOUTH  DAILY WITH LASIX   triamcinolone (KENALOG) 0.1 % APPLY TOPICALLY TWO TIMES  DAILY   Zinc 50 MG CAPS Take 50 mg by mouth daily.   No facility-administered medications prior to visit.    Review of Systems  Constitutional:  Negative for fatigue and fever.  Respiratory:  Negative for cough and shortness of breath.   Cardiovascular:  Negative for chest pain and leg swelling.  Gastrointestinal:  Negative for abdominal pain.  Skin:  Positive for color change and rash.  Neurological:  Negative for dizziness and headaches.      Objective    BP (!) 150/47 (BP Location: Right Arm, Patient Position: Sitting, Cuff Size: Large)    Pulse (!) 46  Temp 98.6 F (37 C) (Oral)    Ht 5\' 3"  (1.6 m)    Wt 271 lb (122.9 kg)    SpO2 98%    BMI 48.01 kg/m  BP Readings from Last 3 Encounters:  06/29/21 (!) 150/47  06/02/21 130/70  04/14/21 (!) 144/80   Wt Readings from Last 3 Encounters:  06/29/21 271 lb (122.9 kg)  06/02/21 238 lb 8 oz (108.2 kg)  04/14/21 274 lb (124.3 kg)      Physical Exam Constitutional:      General: She is awake.     Appearance: She is well-developed.  HENT:     Head: Normocephalic.  Eyes:     Conjunctiva/sclera: Conjunctivae normal.  Cardiovascular:     Rate and  Rhythm: Normal rate and regular rhythm.     Heart sounds: Murmur heard.  Pulmonary:     Effort: Pulmonary effort is normal.     Breath sounds: Normal breath sounds.  Musculoskeletal:     Right lower leg: 2+ Pitting Edema present.     Left lower leg: 2+ Pitting Edema present.     Comments: No pain to movement of right lower extremity.   Skin:    General: Skin is warm.     Comments: Erythema to anterior right lower extremity, warm to touch.  Neurological:     Mental Status: She is alert and oriented to person, place, and time.  Psychiatric:        Attention and Perception: Attention normal.        Mood and Affect: Mood normal.        Speech: Speech normal.        Behavior: Behavior is cooperative.    No results found for any visits on 06/29/21.  Assessment & Plan     Right lower extremity cellulitis Rx bactrim, allergy to pcn and cipro Advised she monitor for extension, fevers, chills, body aches.  Return if symptoms worsen or fail to improve.      I, Mary Kirschner, PA-C have reviewed all documentation for this visit. The documentation on  06/29/2021 for the exam, diagnosis, procedures, and orders are all accurate and complete.    Mary Kirschner, PA-C  [facility] [phone] (phone) [phone] (fax)  [place]

## 2021-07-01 ENCOUNTER — Other Ambulatory Visit: Payer: Self-pay

## 2021-07-01 ENCOUNTER — Emergency Department: Payer: Medicare Other

## 2021-07-01 ENCOUNTER — Encounter: Payer: Self-pay | Admitting: Emergency Medicine

## 2021-07-01 ENCOUNTER — Emergency Department
Admission: EM | Admit: 2021-07-01 | Discharge: 2021-07-01 | Disposition: A | Discharge status: Home / Self Care | Payer: Medicare Other | Source: Home / Self Care | Attending: Emergency Medicine | Admitting: Emergency Medicine

## 2021-07-01 DIAGNOSIS — E039 Hypothyroidism, unspecified: Secondary | ICD-10-CM | POA: Insufficient documentation

## 2021-07-01 DIAGNOSIS — L03115 Cellulitis of right lower limb: Secondary | ICD-10-CM | POA: Insufficient documentation

## 2021-07-01 DIAGNOSIS — I10 Essential (primary) hypertension: Secondary | ICD-10-CM | POA: Insufficient documentation

## 2021-07-01 DIAGNOSIS — L270 Generalized skin eruption due to drugs and medicaments taken internally: Secondary | ICD-10-CM | POA: Diagnosis not present

## 2021-07-01 DIAGNOSIS — L03116 Cellulitis of left lower limb: Secondary | ICD-10-CM | POA: Insufficient documentation

## 2021-07-01 DIAGNOSIS — L03119 Cellulitis of unspecified part of limb: Secondary | ICD-10-CM

## 2021-07-01 LAB — COMPREHENSIVE METABOLIC PANEL
ALT: 27 U/L (ref 0–44)
AST: 31 U/L (ref 15–41)
Albumin: 4.1 g/dL (ref 3.5–5.0)
Alkaline Phosphatase: 67 U/L (ref 38–126)
Anion gap: 12 (ref 5–15)
BUN: 40 mg/dL — ABNORMAL HIGH (ref 8–23)
CO2: 26 mmol/L (ref 22–32)
Calcium: 9.7 mg/dL (ref 8.9–10.3)
Chloride: 97 mmol/L — ABNORMAL LOW (ref 98–111)
Creatinine, Ser: 2.35 mg/dL — ABNORMAL HIGH (ref 0.44–1.00)
GFR, Estimated: 21 mL/min — ABNORMAL LOW (ref >60)
Glucose, Bld: 107 mg/dL — ABNORMAL HIGH (ref 70–99)
Potassium: 4.6 mmol/L (ref 3.5–5.1)
Sodium: 135 mmol/L (ref 135–145)
Total Bilirubin: 0.7 mg/dL (ref 0.3–1.2)
Total Protein: 6.9 g/dL (ref 6.5–8.1)

## 2021-07-01 LAB — CBC WITH DIFFERENTIAL/PLATELET
Abs Immature Granulocytes: 0.02 10*3/uL (ref 0.00–0.07)
Basophils Absolute: 0 10*3/uL (ref 0.0–0.1)
Basophils Relative: 1 %
Eosinophils Absolute: 0.3 10*3/uL (ref 0.0–0.5)
Eosinophils Relative: 5 %
HCT: 37.4 % (ref 36.0–46.0)
Hemoglobin: 11.7 g/dL — ABNORMAL LOW (ref 12.0–15.0)
Immature Granulocytes: 0 %
Lymphocytes Relative: 29 %
Lymphs Abs: 1.8 10*3/uL (ref 0.7–4.0)
MCH: 31 pg (ref 26.0–34.0)
MCHC: 31.3 g/dL (ref 30.0–36.0)
MCV: 98.9 fL (ref 80.0–100.0)
Monocytes Absolute: 0.8 10*3/uL (ref 0.1–1.0)
Monocytes Relative: 13 %
Neutro Abs: 3.3 10*3/uL (ref 1.7–7.7)
Neutrophils Relative %: 52 %
Platelets: 263 10*3/uL (ref 150–400)
RBC: 3.78 MIL/uL — ABNORMAL LOW (ref 3.87–5.11)
RDW: 14.2 % (ref 11.5–15.5)
WBC: 6.3 10*3/uL (ref 4.0–10.5)
nRBC: 0 % (ref 0.0–0.2)

## 2021-07-01 LAB — URINALYSIS, ROUTINE W REFLEX MICROSCOPIC
Bilirubin Urine: NEGATIVE
Glucose, UA: NEGATIVE mg/dL
Ketones, ur: NEGATIVE mg/dL
Nitrite: NEGATIVE
Protein, ur: NEGATIVE mg/dL
Specific Gravity, Urine: 1.018 (ref 1.005–1.030)
pH: 5 (ref 5.0–8.0)

## 2021-07-01 LAB — LACTIC ACID, PLASMA: Lactic Acid, Venous: 1.9 mmol/L (ref 0.5–1.9)

## 2021-07-01 MED ORDER — CLINDAMYCIN HCL 150 MG PO CAPS: 450.0000 mg | ORAL_CAPSULE | Freq: Three times a day (TID) | ORAL | 0 refills | Status: DC

## 2021-07-01 MED ORDER — CLINDAMYCIN PHOSPHATE 600 MG/50ML IV SOLN
600.0000 mg | Freq: Once | INTRAVENOUS | Status: AC
Start: 2021-07-01 — End: 2021-07-01
  Administered 2021-07-01: 600 mg via INTRAVENOUS
  Filled 2021-07-01: qty 50

## 2021-07-01 NOTE — ED Notes (Signed)
BLOOD SENT TO LAB (FULL RAINBOW/BLUE/GRAY/1st SET OF BLOOD CULTURE (LT AC) COLLECTED AT 1400. )

## 2021-07-01 NOTE — ED Provider Notes (Signed)
[facility] Provider Note    Event Date/Time   First MD Initiated Contact with Patient 07/01/21 1515     (approximate)   History   Leg Pain   HPI  Mary Kennedy is a 81 y.o. female with a history of hypertension, hyperlipidemia, hypothyroidism, and prior cellulitis who presents with areas of redness and swelling to both legs, with the more severe area being on the right.  This started about 2 weeks ago on the right, then over the last several days a small area developed on the left lower leg.  The patient was started on Bactrim 2 days ago and has also been using cortisone cream and Benadryl, because she states she thinks she might be allergic to the Bactrim and gets itchy with scattered hives every time she takes it.  She is also allergic to penicillins.  The patient denies any fever or chills.  She denies any redness or swelling spreading up the legs higher than the knee.     Physical Exam   Triage Vital Signs: ED Triage Vitals  Enc Vitals Group     BP 07/01/21 1357 (!) 159/49     Pulse Rate 07/01/21 1357 (!) 57     Resp 07/01/21 1357 18     Temp 07/01/21 1357 97.8 F (36.6 C)     Temp Source 07/01/21 1357 Oral     SpO2 07/01/21 1357 97 %     Weight 07/01/21 1355 271 lb (122.9 kg)     Height 07/01/21 1355 5\' 3"  (1.6 m)     Head Circumference --      Peak Flow --      Pain Score 07/01/21 1355 0     Pain Loc --      Pain Edu? --      Excl. in [place]? --     Most recent vital signs: Vitals:   07/01/21 1656 07/01/21 1729  BP:    Pulse: (!) 51 (!) 48  Resp:    Temp:    SpO2: 100% 100%    General: Awake, no distress.  Comfortable appearing. CV:  Good peripheral perfusion.  Resp:  Normal effort.  Abd:  No distention.  Other:  Right lower extremity with approximately 10 x 30 cm area of erythema, induration, and warmth to the anterior lower leg with a few small bullae draining clear fluid but no open wounds or ulceration.  No purulence.  No  fluctuance.  Left lower extremity with approximately 15 x 8 cm area of mild erythema and induration with no fluctuance and no open wounds or drainage.   ED Results / Procedures / Treatments   Labs (all labs ordered are listed, but only abnormal results are displayed) Labs Reviewed  COMPREHENSIVE METABOLIC PANEL - Abnormal; Notable for the following components:      Result Value   Chloride 97 (*)    Glucose, Bld 107 (*)    BUN 40 (*)    Creatinine, Ser 2.35 (*)    GFR, Estimated 21 (*)    All other components within normal limits  CBC WITH DIFFERENTIAL/PLATELET - Abnormal; Notable for the following components:   RBC 3.78 (*)    Hemoglobin 11.7 (*)    All other components within normal limits  URINALYSIS, ROUTINE W REFLEX MICROSCOPIC - Abnormal; Notable for the following components:   Color, Urine YELLOW (*)    APPearance HAZY (*)    Hgb urine dipstick SMALL (*)    Leukocytes,Ua  SMALL (*)    Bacteria, UA RARE (*)    All other components within normal limits  LACTIC ACID, PLASMA  LACTIC ACID, PLASMA     EKG     RADIOLOGY  US venous lower extremity bilateral: I independently viewed and interpreted the images; there is no evidence of acute DVT.  Radiology report confirms negative ultrasound.  PROCEDURES:  Critical Care performed: No  Procedures   MEDICATIONS ORDERED IN ED: Medications  clindamycin (CLEOCIN) IVPB 600 mg (0 mg Intravenous Stopped 07/01/21 1650)     IMPRESSION / MDM / ASSESSMENT AND PLAN / ED COURSE  I reviewed the triage vital signs and the nursing notes.  81 year old female with PMH as noted above presents with areas to bilateral lower extremities of erythema and induration over the last 1 to 2 weeks, with the larger area being on the right.  I reviewed the past medical records including a progress note from her primary care provider on 2/16, when she was diagnosed with cellulitis on the right leg and started on Bactrim..  On exam the patient is  overall well-appearing and her vital signs are normal except for hypertension.  There are areas of erythema and induration to bilateral lower extremities as described above.  There are a few small bullae containing clear liquid on the right, likely due to swelling, but no purulence or open wounds.  Differential diagnosis includes, but is not limited to, overall presentation is most consistent with cellulitis.  The patient otherwise does not have any significant peripheral edema.  I have a lower suspicion for DVT.  We will obtain bilateral lower extremity ultrasound, lab work-up, and reassess.  Given the patient's allergies to multiple antibiotics and lack of response to oral Bactrim, I will start IV clindamycin.  ----------------------------------------- 7:04 PM on 07/01/2021 -----------------------------------------  Ultrasound is negative for evidence of acute DVT.  Lab work-up is overall reassuring.  The patient does not have any leukocytosis and the lactate is normal.  Urinalysis shows some RBCs and squamous epithelial cells but no findings to suggest concomitant UTI.  Creatinine is elevated but consistent with her recent baseline.  There is no evidence of AKI.  Given the bilateral cellulitis I did consider whether to admit the patient.  I actually offered the patient inpatient admission for further IV antibiotics, however she expresses a strong preference to go home if at all possible.  Given the lack of any findings of sepsis or systemic infection and the fact that she has only been on a few days of an oral antibiotic, I think that this is reasonable.  The patient tolerated the clindamycin well.  I will prescribe for 50 mg 3 times daily of clindamycin for home.  I gave the patient very thorough return precautions and specifically advised her to return immediately for any worsening or spreading of the rash, as she may require inpatient admission if she is not responding adequately to the  clindamycin.  The patient expressed understanding and agreement with the plan.   FINAL CLINICAL IMPRESSION(S) / ED DIAGNOSES   Final diagnoses:  Cellulitis of lower extremity, unspecified laterality     Rx / DC Orders   ED Discharge Orders          Ordered    clindamycin (CLEOCIN) 150 MG capsule  3 times daily        07/01/21 1902             Note:  This document was prepared using Dragon voice recognition  software and may include unintentional dictation errors.    Arta Silence, MD 07/01/21 [phone]

## 2021-07-01 NOTE — Discharge Instructions (Signed)
Take the clindamycin 3 times daily as prescribed.  Keep a close eye on the areas of redness on your legs.  Return to the ER immediately for new, worsening, or spreading redness, increased swelling, rash or streaking going up your leg, fever, chills, body aches, if you have any rash, hives, or allergic reaction to the medication, or any other new or worsening symptoms that concern you.  If the cellulitis is worsening despite the antibiotic, you may need admission to the hospital.

## 2021-07-01 NOTE — ED Triage Notes (Signed)
Pt via POV from. Pt had some redness and swelling of her RLE on Monday 2/6, pt was seen at her PCP on Thursday and prescribed antibiotics. Pt states every time she takes the antibiotics prescribed she itches all over. Pt RLE is red, warm to touch, and swollen. Pt is A&OX4 and NAD.

## 2021-07-03 ENCOUNTER — Ambulatory Visit: Payer: Self-pay | Admitting: *Deleted

## 2021-07-03 ENCOUNTER — Inpatient Hospital Stay
Admission: EM | Admit: 2021-07-03 | Discharge: 2021-07-08 | DRG: 607 | Disposition: A | Discharge status: Home Health | Payer: Medicare Other | Attending: Internal Medicine | Admitting: Internal Medicine

## 2021-07-03 ENCOUNTER — Other Ambulatory Visit: Payer: Self-pay

## 2021-07-03 ENCOUNTER — Encounter: Payer: Self-pay | Admitting: Family Medicine

## 2021-07-03 ENCOUNTER — Ambulatory Visit (INDEPENDENT_AMBULATORY_CARE_PROVIDER_SITE_OTHER): Payer: Medicare Other | Admitting: Family Medicine

## 2021-07-03 VITALS — BP 101/62 | HR 77 | Temp 98.7°F | Resp 18 | Wt 277.0 lb

## 2021-07-03 DIAGNOSIS — Z90710 Acquired absence of both cervix and uterus: Secondary | ICD-10-CM

## 2021-07-03 DIAGNOSIS — Z803 Family history of malignant neoplasm of breast: Secondary | ICD-10-CM | POA: Diagnosis not present

## 2021-07-03 DIAGNOSIS — Z800 Family history of malignant neoplasm of digestive organs: Secondary | ICD-10-CM | POA: Diagnosis not present

## 2021-07-03 DIAGNOSIS — Z20822 Contact with and (suspected) exposure to covid-19: Secondary | ICD-10-CM | POA: Diagnosis present

## 2021-07-03 DIAGNOSIS — J449 Chronic obstructive pulmonary disease, unspecified: Secondary | ICD-10-CM | POA: Diagnosis present

## 2021-07-03 DIAGNOSIS — E871 Hypo-osmolality and hyponatremia: Secondary | ICD-10-CM | POA: Diagnosis present

## 2021-07-03 DIAGNOSIS — Z6841 Body Mass Index (BMI) 40.0 and over, adult: Secondary | ICD-10-CM | POA: Diagnosis not present

## 2021-07-03 DIAGNOSIS — Z79890 Hormone replacement therapy (postmenopausal): Secondary | ICD-10-CM

## 2021-07-03 DIAGNOSIS — E66813 Obesity, class 3: Secondary | ICD-10-CM

## 2021-07-03 DIAGNOSIS — E039 Hypothyroidism, unspecified: Secondary | ICD-10-CM | POA: Diagnosis present

## 2021-07-03 DIAGNOSIS — Z79899 Other long term (current) drug therapy: Secondary | ICD-10-CM

## 2021-07-03 DIAGNOSIS — Z7901 Long term (current) use of anticoagulants: Secondary | ICD-10-CM | POA: Diagnosis not present

## 2021-07-03 DIAGNOSIS — L03115 Cellulitis of right lower limb: Secondary | ICD-10-CM | POA: Diagnosis not present

## 2021-07-03 DIAGNOSIS — L0390 Cellulitis, unspecified: Secondary | ICD-10-CM | POA: Diagnosis present

## 2021-07-03 DIAGNOSIS — Z9049 Acquired absence of other specified parts of digestive tract: Secondary | ICD-10-CM

## 2021-07-03 DIAGNOSIS — I4891 Unspecified atrial fibrillation: Secondary | ICD-10-CM | POA: Diagnosis present

## 2021-07-03 DIAGNOSIS — E785 Hyperlipidemia, unspecified: Secondary | ICD-10-CM | POA: Diagnosis present

## 2021-07-03 DIAGNOSIS — T370X5A Adverse effect of sulfonamides, initial encounter: Secondary | ICD-10-CM | POA: Diagnosis present

## 2021-07-03 DIAGNOSIS — T368X5A Adverse effect of other systemic antibiotics, initial encounter: Secondary | ICD-10-CM | POA: Diagnosis present

## 2021-07-03 DIAGNOSIS — I480 Paroxysmal atrial fibrillation: Secondary | ICD-10-CM | POA: Diagnosis present

## 2021-07-03 DIAGNOSIS — Z87891 Personal history of nicotine dependence: Secondary | ICD-10-CM

## 2021-07-03 DIAGNOSIS — I350 Nonrheumatic aortic (valve) stenosis: Secondary | ICD-10-CM

## 2021-07-03 DIAGNOSIS — T361X5A Adverse effect of cephalosporins and other beta-lactam antibiotics, initial encounter: Secondary | ICD-10-CM | POA: Diagnosis present

## 2021-07-03 DIAGNOSIS — L03119 Cellulitis of unspecified part of limb: Secondary | ICD-10-CM | POA: Insufficient documentation

## 2021-07-03 DIAGNOSIS — S80822A Blister (nonthermal), left lower leg, initial encounter: Secondary | ICD-10-CM | POA: Diagnosis present

## 2021-07-03 DIAGNOSIS — I130 Hypertensive heart and chronic kidney disease with heart failure and stage 1 through stage 4 chronic kidney disease, or unspecified chronic kidney disease: Secondary | ICD-10-CM | POA: Diagnosis present

## 2021-07-03 DIAGNOSIS — Z8249 Family history of ischemic heart disease and other diseases of the circulatory system: Secondary | ICD-10-CM

## 2021-07-03 DIAGNOSIS — L03116 Cellulitis of left lower limb: Secondary | ICD-10-CM | POA: Diagnosis not present

## 2021-07-03 DIAGNOSIS — R600 Localized edema: Secondary | ICD-10-CM | POA: Diagnosis not present

## 2021-07-03 DIAGNOSIS — L270 Generalized skin eruption due to drugs and medicaments taken internally: Secondary | ICD-10-CM | POA: Diagnosis present

## 2021-07-03 DIAGNOSIS — I5042 Chronic combined systolic (congestive) and diastolic (congestive) heart failure: Secondary | ICD-10-CM | POA: Diagnosis present

## 2021-07-03 DIAGNOSIS — Z801 Family history of malignant neoplasm of trachea, bronchus and lung: Secondary | ICD-10-CM | POA: Diagnosis not present

## 2021-07-03 DIAGNOSIS — Z889 Allergy status to unspecified drugs, medicaments and biological substances status: Secondary | ICD-10-CM | POA: Diagnosis not present

## 2021-07-03 DIAGNOSIS — S80821A Blister (nonthermal), right lower leg, initial encounter: Secondary | ICD-10-CM | POA: Diagnosis present

## 2021-07-03 DIAGNOSIS — N184 Chronic kidney disease, stage 4 (severe): Secondary | ICD-10-CM | POA: Diagnosis present

## 2021-07-03 DIAGNOSIS — Z880 Allergy status to penicillin: Secondary | ICD-10-CM | POA: Diagnosis not present

## 2021-07-03 LAB — CBC WITH DIFFERENTIAL/PLATELET
Abs Immature Granulocytes: 0.09 10*3/uL — ABNORMAL HIGH (ref 0.00–0.07)
Basophils Absolute: 0 10*3/uL (ref 0.0–0.1)
Basophils Relative: 0 %
Eosinophils Absolute: 0.3 10*3/uL (ref 0.0–0.5)
Eosinophils Relative: 5 %
HCT: 36.3 % (ref 36.0–46.0)
Hemoglobin: 11.4 g/dL — ABNORMAL LOW (ref 12.0–15.0)
Immature Granulocytes: 1 %
Lymphocytes Relative: 16 %
Lymphs Abs: 1.1 10*3/uL (ref 0.7–4.0)
MCH: 30.6 pg (ref 26.0–34.0)
MCHC: 31.4 g/dL (ref 30.0–36.0)
MCV: 97.3 fL (ref 80.0–100.0)
Monocytes Absolute: 0.7 10*3/uL (ref 0.1–1.0)
Monocytes Relative: 11 %
Neutro Abs: 4.6 10*3/uL (ref 1.7–7.7)
Neutrophils Relative %: 67 %
Platelets: 245 10*3/uL (ref 150–400)
RBC: 3.73 MIL/uL — ABNORMAL LOW (ref 3.87–5.11)
RDW: 14 % (ref 11.5–15.5)
WBC: 6.8 10*3/uL (ref 4.0–10.5)
nRBC: 0 % (ref 0.0–0.2)

## 2021-07-03 LAB — RESP PANEL BY RT-PCR (FLU A&B, COVID) ARPGX2
Influenza A by PCR: NEGATIVE
Influenza B by PCR: NEGATIVE
SARS Coronavirus 2 by RT PCR: NEGATIVE

## 2021-07-03 LAB — BASIC METABOLIC PANEL
Anion gap: 10 (ref 5–15)
BUN: 37 mg/dL — ABNORMAL HIGH (ref 8–23)
CO2: 24 mmol/L (ref 22–32)
Calcium: 9.1 mg/dL (ref 8.9–10.3)
Chloride: 96 mmol/L — ABNORMAL LOW (ref 98–111)
Creatinine, Ser: 2.38 mg/dL — ABNORMAL HIGH (ref 0.44–1.00)
GFR, Estimated: 20 mL/min — ABNORMAL LOW (ref >60)
Glucose, Bld: 115 mg/dL — ABNORMAL HIGH (ref 70–99)
Potassium: 4.9 mmol/L (ref 3.5–5.1)
Sodium: 130 mmol/L — ABNORMAL LOW (ref 135–145)

## 2021-07-03 LAB — LACTIC ACID, PLASMA: Lactic Acid, Venous: 1.4 mmol/L (ref 0.5–1.9)

## 2021-07-03 MED ORDER — DIPHENHYDRAMINE HCL 25 MG PO CAPS
50.0000 mg | ORAL_CAPSULE | Freq: Once | ORAL | Status: AC
Start: 2021-07-03 — End: 2021-07-03
  Administered 2021-07-03: 50 mg via ORAL
  Filled 2021-07-03: qty 2

## 2021-07-03 MED ORDER — CLINDAMYCIN PHOSPHATE 600 MG/50ML IV SOLN
600.0000 mg | Freq: Once | INTRAVENOUS | Status: AC
  Administered 2021-07-03: 600 mg via INTRAVENOUS
  Filled 2021-07-03: qty 50

## 2021-07-03 MED ORDER — SODIUM CHLORIDE 0.9 % IV BOLUS
1000.0000 mL | Freq: Once | INTRAVENOUS | Status: AC
  Administered 2021-07-03: 1000 mL via INTRAVENOUS

## 2021-07-03 NOTE — Telephone Encounter (Signed)
Summary: cellulitis and showering ?   Pt called saying she was in the office Thursday with cellulitis on her leg.  She ended up going to the ER on Saturday.  They gave her medications and sent her home.  She has called wanting to know if she can shower.  She says there are no instructions on her discharge papers regarding that.   CB#  [phone]      Reason for Disposition  [1] Caller has URGENT question AND [2] triager unable to answer question  Answer Assessment - Initial Assessment Questions 1. SYMPTOM: "What's the main symptom you're concerned about?" (e.g., redness, swelling, pain, fever, weakness)     Redness, swelling,pain- draining area 2. CELLULITIS LOCATION: "Where is the cellulitis located?" (e.g., hand, arm, foot, leg, face)     R leg- ankle to 1 1/2 inches from the knee- open wound 3. CELLULITIS SIZE: "What is the size of the red area?" (e.g., inches, centimeters; compare to size of a coin) .     Large area 4. BETTER-SAME-WORSE: "Are you getting better, staying the same, or getting worse compared to the day you started the antibiotics?"      Blisters are large- patient feels it is not spreading as fast  5. PAIN: Do you have any pain?"  If Yes, ask: "How bad is the pain?"  (e.g., Scale 1-10; mild, moderate, or severe)    - MILD (1-3): doesn't interfere with normal activities     - MODERATE (4-7): interferes with normal activities or awakens from sleep    - SEVERE (8-10): excruciating pain, unable to do any normal activities       Only hurt before rupture of blister- using Tylenol, temperature- 96.5 yesterday 6. FEVER: "Do you have a fever?" If Yes, ask: "What is it, how was it measured and when did it start?"     no 7. OTHER SYMPTOMS: "Do you have any other symptoms?" (e.g., pus coming from a wound, red streaks, weakness)     Draining 8. DIAGNOSIS DATE: "When was the cellulitis diagnosed?" "By whom?"      Thursday- patient was seen at office- but started with itching before  the appointment 9. ANTIBIOTIC NAME: "What antibiotic(s) are you taking?"  "How many times per day?" (Be sure the patient is receiving the antibiotic as directed).      Clindamycin 150 mg- 3 times/day 10. ANTIBIOTIC DATE: "When was the antibiotic started?"       Sunday 11. FOLLOW-UP APPOINTMENT: "Do you have follow-up appointment with your doctor?"       Will need follow up  Protocols used: Cellulitis on Antibiotic Follow-up Call-[facility]

## 2021-07-03 NOTE — ED Provider Notes (Signed)
[facility] Provider Note    Event Date/Time   First MD Initiated Contact with Patient 07/03/21 2057     (approximate)   History   Chief Complaint Cellulitis   HPI Mary Kennedy is a 81 y.o. female, history of hyperlipidemia, hypothyroidism, atrial fibrillation, CKD stage III, presents to the emergency department for evaluation of cellulitis.  Patient states that she originally presented to her family clinic on 06/29/2021 for erythema on her lower extremities, subsequently diagnosed with cellulitis and treated initially with Bactrim.  Over the course of 2 days, the patient's cellulitis worsened, and additionally gave the patient an urticarial rash spreading across her torso and upper extremities.  On 07/01/2021, she presented here to the emergency department for evaluation of worsening cellulitis.  She was prescribed oral clindamycin and discharge.  Here today, she states that her cellulitis has failed to improve, and has now developed several blisters on her lower extremities.  Denies fever/chills, chest pain, shortness of breath, abdominal pain, flank pain, urinary symptoms, headache, or nausea/vomiting   History Limitations: No limitations      Physical Exam  Triage Vital Signs: ED Triage Vitals [07/03/21 1503]  Enc Vitals Group     BP 91/60     Pulse Rate (!) 59     Resp 20     Temp 98.5 F (36.9 C)     Temp Source Oral     SpO2 99 %     Weight 276 lb 14.4 oz (125.6 kg)     Height 5\' 3"  (1.6 m)     Head Circumference      Peak Flow      Pain Score 0     Pain Loc      Pain Edu?      Excl. in [place]?     Most recent vital signs: Vitals:   07/04/21 0547 07/04/21 0726  BP: (!) 136/49 (!) 121/40  Pulse: (!) 53 (!) 57  Resp: 16 18  Temp: 97.8 F (36.6 C) 97.7 F (36.5 C)  SpO2: 98% 100%    General: Awake, NAD.  CV: Good peripheral perfusion.  Resp: Normal effort.  Abd: Soft, non-tender. No distention.  Neuro: At baseline. No gross  neurological deficits. Other: Diffuse erythema and blistering in the lower extremities bilaterally, below the knees.  No active bleeding or discharge.  Urticarial rash present on her torso and upper extremities.   Physical Exam    ED Results / Procedures / Treatments  Labs (all labs ordered are listed, but only abnormal results are displayed) Labs Reviewed  BASIC METABOLIC PANEL - Abnormal; Notable for the following components:      Result Value   Sodium 130 (*)    Chloride 96 (*)    Glucose, Bld 115 (*)    BUN 37 (*)    Creatinine, Ser 2.38 (*)    GFR, Estimated 20 (*)    All other components within normal limits  CBC WITH DIFFERENTIAL/PLATELET - Abnormal; Notable for the following components:   RBC 3.73 (*)    Hemoglobin 11.4 (*)    Abs Immature Granulocytes 0.09 (*)    All other components within normal limits  RESP PANEL BY RT-PCR (FLU A&B, COVID) [id number]  LACTIC ACID, PLASMA     EKG Not applicable.   RADIOLOGY  ED Provider Interpretation: Not applicable  No results found.  PROCEDURES:  Critical Care performed: None.  Procedures    MEDICATIONS ORDERED IN ED: Medications  amiodarone (PACERONE)  tablet 200 mg (has no administration in time range)  furosemide (LASIX) tablet 40 mg (40 mg Oral Given 07/04/21 0902)  losartan (COZAAR) tablet 100 mg (100 mg Oral Given 07/04/21 0902)  metoprolol succinate (TOPROL-XL) 24 hr tablet 25 mg (25 mg Oral Given 07/04/21 0902)  spironolactone (ALDACTONE) tablet 25 mg (25 mg Oral Given 07/04/21 0902)  levothyroxine (SYNTHROID) tablet 125 mcg (125 mcg Oral Given 07/04/21 0553)  apixaban (ELIQUIS) tablet 5 mg (5 mg Oral Given 07/04/21 4627)  zinc sulfate capsule 220 mg (220 mg Oral Given 07/04/21 0902)  acetaminophen (TYLENOL) tablet 650 mg (has no administration in time range)    Or  acetaminophen (TYLENOL) suppository 650 mg (has no administration in time range)  ondansetron (ZOFRAN) tablet 4 mg (has no administration in time  range)    Or  ondansetron (ZOFRAN) injection 4 mg (has no administration in time range)  clindamycin (CLEOCIN) IVPB 600 mg ( Intravenous Infusion Verify 07/04/21 0624)  HYDROcodone-acetaminophen (NORCO/VICODIN) 5-325 MG per tablet 1-2 tablet (has no administration in time range)  diphenhydrAMINE (BENADRYL) injection 12.5 mg (12.5 mg Intravenous Given 07/04/21 0151)  albuterol (PROVENTIL) (2.5 MG/3ML) 0.083% nebulizer solution 2.5 mg (has no administration in time range)  sodium chloride 0.9 % bolus 1,000 mL (1,000 mLs Intravenous New Bag/Given 07/03/21 2213)  diphenhydrAMINE (BENADRYL) capsule 50 mg (50 mg Oral Given 07/03/21 2211)  clindamycin (CLEOCIN) IVPB 600 mg (0 mg Intravenous Stopped 07/04/21 0031)     IMPRESSION / MDM / ASSESSMENT AND PLAN / ED COURSE  I reviewed the triage vital signs and the nursing notes.                              Mary Kennedy is a 81 y.o. female, history of hyperlipidemia, hypothyroidism, atrial fibrillation, CKD stage III, presents to the emergency department for evaluation of cellulitis.  Patient states that she originally presented to her family clinic on 06/29/2021 for erythema on her lower extremities, subsequently diagnosed with cellulitis and treated initially with Bactrim.  Over the course of 2 days, the patient's cellulitis worsened, and additionally gave the patient an urticarial rash spreading across her torso and upper extremities.  On 07/01/2021, she presented here to the emergency department for evaluation of worsening cellulitis.  She was prescribed oral clindamycin and discharge.  Here today, she states that her cellulitis has failed to improve, and has now developed several blisters on her lower extremities.  Differential diagnosis includes, but is not limited to, cellulitis, adverse drug reaction, DVT,  ED Course Patient appears well.  Vital signs within normal limits.  Currently endorsing itchiness.  We will go ahead and give  diphenhydramine.  CBC unremarkable for leukocytosis or clinically significant anemia.  BMP notable for hyponatremia at 130.  Elevated creatinine at 2.38, consistent with her baseline.  We will go ahead and start IV fluids.  Lactic acid unremarkable at 1.4.  Pending respiratory panel.   Assessment/Plan History, physical exam, and work-up consistent with treatment resistant cellulitis bilaterally in lower extremities.  No evidence of abscess formation.  Initial treatment failure with Bactrim, with no improvement in symptoms and addition of urticarial rash.  PO clindamycin seems to be well-tolerated with mild subjective improvement in the left lower extremity, though is not making significant progress after 48 hours of treatment.  We will go ahead and initiate IV clindamycin.  We will plan to admit this patient.        FINAL  CLINICAL IMPRESSION(S) / ED DIAGNOSES   Final diagnoses:  None     Rx / DC Orders   ED Discharge Orders     None        Note:  This document was prepared using Dragon voice recognition software and may include unintentional dictation errors.   Teodoro Spray, [place] 07/04/21 1002    Merlyn Lot, MD 07/04/21 2004

## 2021-07-03 NOTE — ED Provider Triage Note (Signed)
°  Emergency Medicine Provider Triage Evaluation Note  Mary Kennedy , a 81 y.o.female,  was evaluated in triage.  Pt complains of cellulitis.  Patient states that she was recently treated here a few days ago for cellulitis.  She states that despite p.o. antibiotics, her legs have continued to get worse and she endorses worsening chills at home.    Review of Systems  Positive: Itchiness, chills Negative: Denies fever, chest pain, vomiting  Physical Exam   Vitals:   07/03/21 1503  BP: 91/60  Pulse: (!) 59  Resp: 20  Temp: 98.5 F (36.9 C)  SpO2: 99%   Gen:   Awake, no distress   Resp:  Normal effort  MSK:   Moves extremities without difficulty  Other:  Diffusely erythematous lower extremities below the knees with blistering.  No active bleeding or discharge.  Medical Decision Making  Given the patient's initial medical screening exam, the following diagnostic evaluation has been ordered. The patient will be placed in the appropriate treatment space, once one is available, to complete the evaluation and treatment. I have discussed the plan of care with the patient and I have advised the patient that an ED physician or mid-level practitioner will reevaluate their condition after the test results have been received, as the results may give them additional insight into the type of treatment they may need.    Diagnostics: Labs  Treatments: none immediately   Teodoro Spray, [place] 07/03/21 1529

## 2021-07-03 NOTE — ED Notes (Addendum)
FIRST NURSE NOTE: pt is sent from PCP for worsening cellulitis and possible IV antibiotics

## 2021-07-03 NOTE — Assessment & Plan Note (Signed)
Worsening s/p 2 oral antibiotics with MRSA coverage and dose of IV clindamycin in ED 2/18. Given progressive nature despite oral treatment, recommend presentation to ED for IV antibiotics and wound care. Patient verbalized understanding. Report given to triage nurse.

## 2021-07-03 NOTE — ED Triage Notes (Signed)
Pt here with bilateral LLE cellulitis. Pt was here on Sat with the same issues but states the wounds are getting worse, not better. Pt has both legs wrapped with abd pads and kerlix. Pt in NAD in triage.

## 2021-07-03 NOTE — Telephone Encounter (Signed)
°  Chief Complaint: cellulitis on leg- seen at ED Saturday Symptoms: wound draining Frequency:   Pertinent Negatives: Patient denies fever Disposition: [] ED /[] Urgent Care (no appt availability in office) / [x] Appointment(In office/virtual)/ []  [place] [facility]/ [] Home Care/ [] Refused Recommended Disposition /[] [facility]/ []  Follow-up with PCP Additional Notes: Patient has open cellulitis wound- she was seen at ED and given IV medication and is presently taking oral antibiotics. Patient may need wound care help- appointment scheduled for evaluation.

## 2021-07-03 NOTE — Progress Notes (Signed)
° ° °  SUBJECTIVE:   CHIEF COMPLAINT / HPI:   CELLULITIS - seen previously 2/16 for RLL redness, warmth, swelling. Given bactrim. Seen 2/18 in ED for same, R>L with few draining bullae, 15x8cm erythema to LLL at that time. LE doppler negative for DVT. Given IV clindamycin and d/ced with oral clindamycin.  - continues on oral clindamycin, feels redness and warmth is getting worse.  - open weeping wounds - denies fever, chills   OBJECTIVE:   BP 101/62 (BP Location: Left Arm, Patient Position: Sitting, Cuff Size: Large)    Pulse 77    Temp 98.7 F (37.1 C) (Temporal)    Resp 18    Wt 277 lb (125.6 kg)    SpO2 96%    BMI 49.07 kg/m   Gen: non toxic appearing, in NAD Ext: erythema extending from bilateral ankle to proximal shins, R>L with multiple weeping bullae and open wound to R anterior shin.    ASSESSMENT/PLAN:   Cellulitis of lower extremity Worsening s/p 2 oral antibiotics with MRSA coverage and dose of IV clindamycin in ED 2/18. Given progressive nature despite oral treatment, recommend presentation to ED for IV antibiotics and wound care. Patient verbalized understanding. Report given to triage nurse.      Myles Gip, DO

## 2021-07-04 ENCOUNTER — Encounter: Payer: Self-pay | Admitting: Internal Medicine

## 2021-07-04 DIAGNOSIS — N184 Chronic kidney disease, stage 4 (severe): Secondary | ICD-10-CM

## 2021-07-04 DIAGNOSIS — L03115 Cellulitis of right lower limb: Secondary | ICD-10-CM | POA: Diagnosis not present

## 2021-07-04 DIAGNOSIS — Z889 Allergy status to unspecified drugs, medicaments and biological substances status: Secondary | ICD-10-CM

## 2021-07-04 DIAGNOSIS — I5042 Chronic combined systolic (congestive) and diastolic (congestive) heart failure: Secondary | ICD-10-CM

## 2021-07-04 DIAGNOSIS — Z7901 Long term (current) use of anticoagulants: Secondary | ICD-10-CM

## 2021-07-04 DIAGNOSIS — L03116 Cellulitis of left lower limb: Secondary | ICD-10-CM

## 2021-07-04 DIAGNOSIS — I4891 Unspecified atrial fibrillation: Secondary | ICD-10-CM

## 2021-07-04 DIAGNOSIS — L270 Generalized skin eruption due to drugs and medicaments taken internally: Secondary | ICD-10-CM

## 2021-07-04 DIAGNOSIS — I350 Nonrheumatic aortic (valve) stenosis: Secondary | ICD-10-CM

## 2021-07-04 MED ORDER — POTASSIUM CHLORIDE CRYS ER 20 MEQ PO TBCR: 20.0000 meq | EXTENDED_RELEASE_TABLET | Freq: Two times a day (BID) | ORAL | Status: DC

## 2021-07-04 MED ORDER — DIPHENHYDRAMINE HCL 50 MG/ML IJ SOLN
12.5000 mg | Freq: Four times a day (QID) | INTRAMUSCULAR | Status: AC | PRN
  Administered 2021-07-04 (×2): 12.5 mg via INTRAVENOUS
  Filled 2021-07-04 (×2): qty 1

## 2021-07-04 MED ORDER — HYDROCODONE-ACETAMINOPHEN 5-325 MG PO TABS
1.0000 | ORAL_TABLET | ORAL | Status: DC | PRN
  Administered 2021-07-05: 1 via ORAL
  Filled 2021-07-04: qty 1

## 2021-07-04 MED ORDER — CLINDAMYCIN PHOSPHATE 600 MG/50ML IV SOLN
600.0000 mg | Freq: Three times a day (TID) | INTRAVENOUS | Status: DC
  Administered 2021-07-04 (×3): 600 mg via INTRAVENOUS
  Filled 2021-07-04 (×5): qty 50

## 2021-07-04 MED ORDER — DIPHENHYDRAMINE HCL 50 MG/ML IJ SOLN
12.5000 mg | Freq: Four times a day (QID) | INTRAMUSCULAR | Status: AC | PRN
  Administered 2021-07-05 (×2): 12.5 mg via INTRAVENOUS
  Filled 2021-07-04 (×2): qty 1

## 2021-07-04 MED ORDER — ZINC SULFATE 220 (50 ZN) MG PO CAPS
220.0000 mg | ORAL_CAPSULE | Freq: Every day | ORAL | Status: DC
  Administered 2021-07-04 – 2021-07-08 (×5): 220 mg via ORAL
  Filled 2021-07-04 (×5): qty 1

## 2021-07-04 MED ORDER — FUROSEMIDE 40 MG PO TABS
40.0000 mg | ORAL_TABLET | Freq: Every day | ORAL | Status: DC
Start: 2021-07-04 — End: 2021-07-04
  Administered 2021-07-04: 40 mg via ORAL
  Filled 2021-07-04: qty 1

## 2021-07-04 MED ORDER — SPIRONOLACTONE 25 MG PO TABS
25.0000 mg | ORAL_TABLET | Freq: Every day | ORAL | Status: DC
  Administered 2021-07-04 – 2021-07-08 (×5): 25 mg via ORAL
  Filled 2021-07-04 (×6): qty 1

## 2021-07-04 MED ORDER — FUROSEMIDE 10 MG/ML IJ SOLN
40.0000 mg | Freq: Two times a day (BID) | INTRAMUSCULAR | Status: DC
  Administered 2021-07-04 – 2021-07-08 (×8): 40 mg via INTRAVENOUS
  Filled 2021-07-04 (×8): qty 4

## 2021-07-04 MED ORDER — APIXABAN 5 MG PO TABS
5.0000 mg | ORAL_TABLET | Freq: Two times a day (BID) | ORAL | Status: DC
  Administered 2021-07-04 – 2021-07-08 (×9): 5 mg via ORAL
  Filled 2021-07-04 (×9): qty 1

## 2021-07-04 MED ORDER — ALBUTEROL SULFATE HFA 108 (90 BASE) MCG/ACT IN AERS: 2.0000 | INHALATION_SPRAY | Freq: Four times a day (QID) | RESPIRATORY_TRACT | Status: DC | PRN

## 2021-07-04 MED ORDER — METOPROLOL SUCCINATE ER 25 MG PO TB24
25.0000 mg | ORAL_TABLET | Freq: Every day | ORAL | Status: DC
  Administered 2021-07-04 – 2021-07-08 (×5): 25 mg via ORAL
  Filled 2021-07-04 (×5): qty 1

## 2021-07-04 MED ORDER — ONDANSETRON HCL 4 MG PO TABS: 4.0000 mg | ORAL_TABLET | Freq: Four times a day (QID) | ORAL | Status: DC | PRN

## 2021-07-04 MED ORDER — ACETAMINOPHEN 325 MG PO TABS
650.0000 mg | ORAL_TABLET | Freq: Four times a day (QID) | ORAL | Status: DC | PRN
  Administered 2021-07-04: 650 mg via ORAL
  Filled 2021-07-04: qty 2

## 2021-07-04 MED ORDER — ALBUTEROL SULFATE (2.5 MG/3ML) 0.083% IN NEBU: 2.5000 mg | INHALATION_SOLUTION | Freq: Four times a day (QID) | RESPIRATORY_TRACT | Status: DC | PRN

## 2021-07-04 MED ORDER — AMIODARONE HCL 200 MG PO TABS
200.0000 mg | ORAL_TABLET | Freq: Every day | ORAL | Status: DC
  Administered 2021-07-04 – 2021-07-07 (×4): 200 mg via ORAL
  Filled 2021-07-04 (×4): qty 1

## 2021-07-04 MED ORDER — LOSARTAN POTASSIUM 50 MG PO TABS
100.0000 mg | ORAL_TABLET | Freq: Every day | ORAL | Status: DC
  Administered 2021-07-04 – 2021-07-08 (×5): 100 mg via ORAL
  Filled 2021-07-04 (×5): qty 2

## 2021-07-04 MED ORDER — ONDANSETRON HCL 4 MG/2ML IJ SOLN: 4.0000 mg | Freq: Four times a day (QID) | INTRAMUSCULAR | Status: DC | PRN

## 2021-07-04 MED ORDER — LEVOTHYROXINE SODIUM 50 MCG PO TABS
125.0000 ug | ORAL_TABLET | Freq: Every day | ORAL | Status: DC
  Administered 2021-07-04 – 2021-07-08 (×5): 125 ug via ORAL
  Filled 2021-07-04 (×5): qty 1

## 2021-07-04 MED ORDER — ACETAMINOPHEN 650 MG RE SUPP: 650.0000 mg | Freq: Four times a day (QID) | RECTAL | Status: DC | PRN

## 2021-07-04 NOTE — Hospital Course (Signed)
81 year old female with past medical history of atrial fibrillation on Eliquis, moderate to severe aortic stenosis, CKD stage IV, hypertension, hypothyroidism, morbid obesity, COPD, chronic combined CHF with chronic lower extremity edema presented with erythema and blistering lesions bilateral lower extremities.  She had a generalized rash after starting Bactrim.  Previous allergies to penicillin, Cipro.  Patient admitted with bilateral lower extremity cellulitis with blistering lesions and started on IV clindamycin

## 2021-07-04 NOTE — Assessment & Plan Note (Signed)
Continue levothyroxine 

## 2021-07-04 NOTE — Progress Notes (Signed)
Progress Note   [name] YQI:[id number] DOB: 11/16/1940 DOA: 07/03/2021     1 DOS: the patient was seen and examined on 07/04/2021   Brief hospital course: 81 year old female with past medical history of atrial fibrillation on Eliquis, moderate to severe aortic stenosis, CKD stage IV, hypertension, hypothyroidism, morbid obesity, COPD, chronic combined CHF with chronic lower extremity edema presented with erythema and blistering lesions bilateral lower extremities.  She had a generalized rash after starting Bactrim.  Previous allergies to penicillin, Cipro.  Patient admitted with bilateral lower extremity cellulitis with blistering lesions and started on IV clindamycin  Assessment and Plan: * Cellulitis- (present on admission) Bilateral lower extremity blistering cellulitis.  With multiple drug allergies I did get infectious disease consultation.  Currently on clindamycin.  Elevation of the legs.  Drug rash Generalized rash seen on abdomen and arms.  Likely secondary to Bactrim that was recently started for lower extremity cellulitis.  Continue to monitor.  Chronic combined systolic and diastolic heart failure ([place])- (present on admission) With blistering edema of lower extremities I will start IV Lasix.  Continue spironolactone, losartan and Toprol  Obesity, Class III, BMI 40-49.9 (morbid obesity) (HCC) BMI 49.05  Aortic stenosis Moderate to severe Followed by cardiology.  Last seen 2/8 with discussion of referral for TAVR  CKD (chronic kidney disease) stage 4, GFR 15-29 ml/min (HCC) Watch closely with diuresis.  Creatinine 2.38.  Last year creatinine 2.2  Atrial fibrillation ([place])- (present on admission) Paroxysmal in nature.  Rate controlled.  Continue metoprolol, amiodarone and and apixaban  Adult hypothyroidism- (present on admission) Continue levothyroxine        Subjective: Patient does have a little pain in bilateral lower extremities.  Has blistering  lesions bilateral lower extremity.  Developed a rash after recently started Bactrim on her abdomen and arms.  Coming in with bilateral lower extremity cellulitis  Physical Exam: Vitals:   07/04/21 0048 07/04/21 0547 07/04/21 0726 07/04/21 1142  BP: 114/65 (!) 136/49 (!) 121/40 (!) 141/49  Pulse: (!) 56 (!) 53 (!) 57 (!) 59  Resp: 16 16 18 18   Temp: 97.9 F (36.6 C) 97.8 F (36.6 C) 97.7 F (36.5 C) 97.7 F (36.5 C)  TempSrc:      SpO2: 97% 98% 100% 99%  Weight:      Height:       Physical Exam HENT:     Head: Normocephalic.     Mouth/Throat:     Pharynx: No oropharyngeal exudate.  Eyes:     General: Lids are normal.     Conjunctiva/sclera: Conjunctivae normal.  Cardiovascular:     Rate and Rhythm: Normal rate and regular rhythm.     Heart sounds: S1 normal and S2 normal. Murmur heard.  Systolic murmur is present with a grade of 2/6.  Pulmonary:     Breath sounds: Examination of the right-lower field reveals decreased breath sounds. Examination of the left-lower field reveals decreased breath sounds. Decreased breath sounds present. No wheezing, rhonchi or rales.  Abdominal:     Palpations: Abdomen is soft.     Tenderness: There is no abdominal tenderness.  Musculoskeletal:     Right lower leg: Swelling present.     Left lower leg: Swelling present.  Skin:    General: Skin is warm.     Comments: Blotchy rash on abdomen and arms. Lower extremity lesions see pictures below.  Neurological:     Mental Status: She is alert and oriented to person, place, and time.  Above is left leg Below his right leg      Data Reviewed: Today's creatinine 2.35, white blood cell count 6.3, hemoglobin 11.7  Family Communication: Declined  Disposition: Status is: Inpatient Remains inpatient appropriate because: Being treated for bilateral lower extremity blistering cellulitis.  Planned Discharge Destination: Home   Author: Loletha Grayer, MD 07/04/2021 12:48 PM  For on  call review [email or web address].

## 2021-07-04 NOTE — Assessment & Plan Note (Addendum)
With blistering edema of lower extremities I will start IV Lasix.  Continue spironolactone, losartan and Toprol

## 2021-07-04 NOTE — Assessment & Plan Note (Addendum)
Watch closely with diuresis.  Creatinine 2.38.  Last year creatinine 2.2

## 2021-07-04 NOTE — Consult Note (Signed)
NAME: Mary Kennedy  DOB: 11-Jan-1941  MRN: [id number]  Date/Time: 07/04/2021 7:03 PM  REQUESTING PROVIDER: [email or web address] Subjective:  REASON FOR CONSULT: b/l legs blisters cellulitis ? Mary Kennedy is a 81 y.o. with a history of HTN, hypothyroidism presents with b/lsitering lesions legs Pt says she started with redness rt shin . She went to her PCP on 2/16 and was prescribed bactrim- this made her break into a rash after 5 doses and both her shins became red and blistering. No fever and ahse came to the ED. She lives on her own and drove herself to ED In the ED BP 159/49, Temp 97.8, pulse 57. WBC 6.3, HB 11.7, Cr 2.35,. Blood culture sent and she was started on clindamycin and I am asked to see her. 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 After that she has not had cellulitis until now She is an ex smoker  Past Medical History:  Diagnosis Date   Allergy    Aortic stenosis    Atrial fibrillation (HCC)    Atypical squamous cells of undetermined significance on cytologic smear of vagina (ASC-US) 12/01/2014   s/p gynecology consultation 07/2010; s/p repeat pap smear with HPV.    Cellulitis    Heart murmur    Hyperlipidemia    Hypertension    Hypothyroidism    Thyroid disease    Wears dentures    partial lower, full upper    Past Surgical History:  Procedure Laterality Date   ABDOMINAL HYSTERECTOMY  1961   BIL Oophorectomy   APPENDECTOMY  at age 60   [place] N/A 01/18/2021   Procedure: CARDIOVERSION;  Surgeon: Yolonda Kida, MD;  Location: [facility];  Service: Cardiovascular;  Laterality: N/A;   CARDIOVERSION N/A 03/23/2021   Procedure: CARDIOVERSION;  Surgeon: Corey Skains, MD;  Location: [facility];  Service: Cardiovascular;  Laterality: N/A;   CATARACT EXTRACTION W/PHACO Left  09/01/2019   Procedure: CATARACT EXTRACTION PHACO AND INTRAOCULAR LENS PLACEMENT ([place]) LEFT 8.84 00:49.4;  Surgeon: Birder Robson, MD;  Location: [facility];  Service: Ophthalmology;  Laterality: Left;   CATARACT EXTRACTION W/PHACO Right 12/22/2019   Procedure: CATARACT EXTRACTION PHACO AND INTRAOCULAR LENS PLACEMENT ([facility]) RIGHT 6.77 00:42.9;  Surgeon: Birder Robson, MD;  Location: [facility];  Service: Ophthalmology;  Laterality: Right;   COLONOSCOPY WITH PROPOFOL N/A 08/27/2017   Procedure: COLONOSCOPY WITH PROPOFOL;  Surgeon: Lucilla Lame, MD;  Location: [facility];  Service: Endoscopy;  Laterality: N/A;   FOOT SURGERY Bilateral    RIGHT/LEFT HEART CATH AND CORONARY ANGIOGRAPHY N/A 04/14/2021   Procedure: RIGHT/LEFT HEART CATH AND CORONARY ANGIOGRAPHY;  Surgeon: Yolonda Kida, MD;  Location: [facility];  Service: Cardiovascular;  Laterality: N/A;   TEE WITHOUT CARDIOVERSION N/A 03/23/2021   Procedure: TRANSESOPHAGEAL ECHOCARDIOGRAM (TEE);  Surgeon: Corey Skains, MD;  Location: [facility];  Service: Cardiovascular;  Laterality: N/A;   TONSILLECTOMY  at age 51    Social History   Socioeconomic History   Marital status: Divorced    Spouse name: Not on file   Number of children: 2   Years of education: Not on file   Highest education level: Some college, no degree  Occupational History   Occupation: Retired    Comment: working part-time  Tobacco  Use   Smoking status: Former    Types: Cigarettes    Quit date: 05/13/1996    Years since quitting: 25.1   Smokeless tobacco: Never  Vaping Use   Vaping Use: Never used  Substance and Sexual Activity   Alcohol use: Not Currently    Alcohol/week: 0.0 - 2.0 standard drinks   Drug use: No   Sexual activity: Not on file  Other Topics Concern   Not on file  Social History Narrative   Lives by herself.    Social Determinants of Health   Financial Resource Strain: Not on file  Food Insecurity:  Not on file  Transportation Needs: Not on file  Physical Activity: Not on file  Stress: Not on file  Social Connections: Not on file  Intimate Partner Violence: Not on file    Family History  Problem Relation Age of Onset   Pancreatitis Mother    Drug abuse Brother        PTSD   Cancer Brother        pancreas lesion   Colon cancer Brother    Heart attack Father    Lung cancer Brother    Cancer Brother        lung cancer   Breast cancer Cousin        2nd cousin   Allergies  Allergen Reactions   Lisinopril Anaphylaxis, Swelling and Cough    Close airway   Other Swelling    Persimmon causes mouth and throat swelling   Tegaderm Ag Mesh 2"X2"  [Wound Dressings]     when removing the patch her skin came off.   Wound Dressing Adhesive     Adhesive from covering for PICC line tore skin   Ciprofloxacin Hives and Rash   Latex Rash    Adhesive from covering for PICC line tore skin   Penicillins Swelling and Rash   I? Current Facility-Administered Medications  Medication Dose Route Frequency Provider Last Rate Last Admin   acetaminophen (TYLENOL) tablet 650 mg  650 mg Oral Q6H PRN Athena Masse, MD       Or   acetaminophen (TYLENOL) suppository 650 mg  650 mg Rectal Q6H PRN Athena Masse, MD       albuterol (PROVENTIL) (2.5 MG/3ML) 0.083% nebulizer solution 2.5 mg  2.5 mg Nebulization Q6H PRN Renda Rolls, RPH       amiodarone (PACERONE) tablet 200 mg  200 mg Oral Q1500 Judd Gaudier V, MD   200 mg at 07/04/21 1133   apixaban (ELIQUIS) tablet 5 mg  5 mg Oral BID Athena Masse, MD   5 mg at 07/04/21 5462   clindamycin (CLEOCIN) IVPB 600 mg  600 mg Intravenous Q8H Judd Gaudier V, MD 100 mL/hr at 07/04/21 1340 600 mg at 07/04/21 1340   furosemide (LASIX) injection 40 mg  40 mg Intravenous BID Loletha Grayer, MD   40 mg at 07/04/21 1735   HYDROcodone-acetaminophen (NORCO/VICODIN) 5-325 MG per tablet 1-2 tablet  1-2 tablet Oral Q4H PRN Athena Masse, MD        levothyroxine (SYNTHROID) tablet 125 mcg  125 mcg Oral Q0600 Athena Masse, MD   125 mcg at 07/04/21 0553   losartan (COZAAR) tablet 100 mg  100 mg Oral Daily Judd Gaudier V, MD   100 mg at 07/04/21 0902   metoprolol succinate (TOPROL-XL) 24 hr tablet 25 mg  25 mg Oral Daily Athena Masse, MD   25 mg at 07/04/21 [phone]  ondansetron (ZOFRAN) tablet 4 mg  4 mg Oral Q6H PRN Athena Masse, MD       Or   ondansetron [facility]) injection 4 mg  4 mg Intravenous Q6H PRN Athena Masse, MD       spironolactone (ALDACTONE) tablet 25 mg  25 mg Oral Daily Athena Masse, MD   25 mg at 07/04/21 4270   zinc sulfate capsule 220 mg  220 mg Oral Daily Athena Masse, MD   220 mg at 07/04/21 6237     Abtx:  Anti-infectives (From admission, onward)    Start     Dose/Rate Route Frequency Ordered Stop   07/04/21 0600  clindamycin (CLEOCIN) IVPB 600 mg        600 mg 100 mL/hr over 30 Minutes Intravenous Every 8 hours 07/04/21 0113 07/11/21 0559   07/03/21 2215  clindamycin (CLEOCIN) IVPB 600 mg        600 mg 100 mL/hr over 30 Minutes Intravenous  Once 07/03/21 2212 07/04/21 0031       REVIEW OF SYSTEMS:  Const: negative fever, negative chills, negative weight loss Eyes: negative diplopia or visual changes, negative eye pain ENT: negative coryza, negative sore throat Resp: negative cough, hemoptysis, dyspnea Cards: negative for chest pain, palpitations, lower extremity edema GU: negative for frequency, dysuria and hematuria GI: Negative for abdominal pain, diarrhea, bleeding, constipation Skin: as above Heme: negative for easy bruising and gum/nose bleeding MS: negative for myalgias, arthralgias, back pain and muscle weakness Neurolo:negative for headaches, dizziness, vertigo, memory problems  Psych: negative for feelings of anxiety, depression  Endocrine: thyroid, Allergy/Immunology- PCN, levaquin, now bactrim Objective:  VITALS:  BP (!) 128/39 (BP Location: Left Arm)    Pulse (!) 52     Temp 97.9 F (36.6 C)    Resp 16    Ht 5\' 3"  (1.6 m)    Wt 125.6 kg    SpO2 97%    BMI 49.05 kg/m  PHYSICAL EXAM:  General: Alert, cooperative, no distress, appears young for  age.  Head: Normocephalic, without obvious abnormality, atraumatic. Eyes: Conjunctivae clear, anicteric sclerae. Pupils are equal ENT Nares normal. No drainage or sinus tenderness. Lips, mucosa, and tongue normal. No Thrush Neck: Supple, symmetrical, no adenopathy, thyroid: non tender no carotid bruit and no JVD. Back: No CVA tenderness. Lungs: Clear to auscultation bilaterally. No Wheezing or Rhonchi. No rales. Heart: Regular rate and rhythm, no murmur, rub or gallop. Abdomen: Soft, non-tender,not distended. Bowel sounds normal. No masses Extremities: both shins are erythematous with a papular rash at the periphery and blisters in the center          Skin: macular rash over thighs, abdomen Lymph: Cervical, supraclavicular normal. Neurologic: Grossly non-focal Pertinent Labs Lab Results CBC    Component Value Date/Time   WBC 6.8 07/03/2021 1511   RBC 3.73 (L) 07/03/2021 1511   HGB 11.4 (L) 07/03/2021 1511   HGB 13.4 10/19/2019 0921   HCT 36.3 07/03/2021 1511   HCT 40.8 10/19/2019 0921   PLT 245 07/03/2021 1511   PLT 245 10/19/2019 0921   MCV 97.3 07/03/2021 1511   MCV 97 10/19/2019 0921   MCV 97 06/04/2014 1037   MCH 30.6 07/03/2021 1511   MCHC 31.4 07/03/2021 1511   RDW 14.0 07/03/2021 1511   RDW 13.0 10/19/2019 0921   RDW 13.6 06/04/2014 1037   LYMPHSABS 1.1 07/03/2021 1511   LYMPHSABS 2.8 10/19/2019 0921   MONOABS 0.7 07/03/2021 1511   EOSABS 0.3 07/03/2021  1511   EOSABS 0.2 10/19/2019 0921   BASOSABS 0.0 07/03/2021 1511   BASOSABS 0.1 10/19/2019 0921    CMP Latest Ref Rng & Units 07/03/2021 07/01/2021 06/05/2021  Glucose 70 - 99 mg/dL 115(H) 107(H) -  BUN 8 - 23 mg/dL 37(H) 40(H) -  Creatinine 0.44 - 1.00 mg/dL 2.38(H) 2.35(H) -  Sodium 135 - 145 mmol/L 130(L) 135 -  Potassium 3.5  - 5.1 mmol/L 4.9 4.6 -  Chloride 98 - 111 mmol/L 96(L) 97(L) -  CO2 22 - 32 mmol/L 24 26 -  Calcium 8.9 - 10.3 mg/dL 9.1 9.7 -  Total Protein 6.5 - 8.1 g/dL - 6.9 6.2  Total Bilirubin 0.3 - 1.2 mg/dL - 0.7 1.0  Alkaline Phos 38 - 126 U/L - 67 66  AST 15 - 41 U/L - 31 20  ALT 0 - 44 U/L - 27 24      Microbiology: Recent Results (from the past 240 hour(s))  Resp Panel by RT-PCR (Flu A&B, Covid) Nasopharyngeal Swab     Status: None   Collection Time: 07/03/21 10:17 PM   Specimen: Nasopharyngeal Swab; Nasopharyngeal(NP) swabs in vial transport medium  Result Value Ref Range Status   SARS Coronavirus 2 by RT PCR NEGATIVE NEGATIVE Final    Comment: (NOTE) SARS-CoV-2 target nucleic acids are NOT DETECTED.  The SARS-CoV-2 RNA is generally detectable in upper respiratory specimens during the acute phase of infection. The lowest concentration of SARS-CoV-2 viral copies this assay can detect is 138 copies/mL. A negative result does not preclude SARS-Cov-2 infection and should not be used as the sole basis for treatment or other patient management decisions. A negative result may occur with  improper specimen collection/handling, submission of specimen other than nasopharyngeal swab, presence of viral mutation(s) within the areas targeted by this assay, and inadequate number of viral copies(<138 copies/mL). A negative result must be combined with clinical observations, patient history, and epidemiological information. The expected result is Negative.  Fact Sheet for Patients:  EntrepreneurPulse.com.au  Fact Sheet for Healthcare Providers:  IncredibleEmployment.be  This test is no t yet approved or cleared by the [place] FDA and  has been authorized for detection and/or diagnosis of SARS-CoV-2 by FDA under an Emergency Use Authorization (EUA). This EUA will remain  in effect (meaning this test can be used) for the duration of the COVID-19  declaration under Section 564(b)(1) of the Act, 21 U.S.C.section 360bbb-3(b)(1), unless the authorization is terminated  or revoked sooner.       Influenza A by PCR NEGATIVE NEGATIVE Final   Influenza B by PCR NEGATIVE NEGATIVE Final    Comment: (NOTE) The Xpert Xpress SARS-CoV-2/FLU/RSV plus assay is intended as an aid in the diagnosis of influenza from Nasopharyngeal swab specimens and should not be used as a sole basis for treatment. Nasal washings and aspirates are unacceptable for Xpert Xpress SARS-CoV-2/FLU/RSV testing.  Fact Sheet for Patients: EntrepreneurPulse.com.au  Fact Sheet for Healthcare Providers: IncredibleEmployment.be  This test is not yet approved or cleared by the [place] FDA and has been authorized for detection and/or diagnosis of SARS-CoV-2 by FDA under an Emergency Use Authorization (EUA). This EUA will remain in effect (meaning this test can be used) for the duration of the COVID-19 declaration under Section 564(b)(1) of the Act, 21 U.S.C. section 360bbb-3(b)(1), unless the authorization is terminated or revoked.  Performed at [facility], [address] [place], [place] [address]     IMAGING RESULTS: I have personally reviewed the films ?  Impression/Recommendation ? ?81 yr female presenting with what may have started as cellulitis rt leg but developed an allergic reaction to bactrim causing a wide spread rash ad also the left shin rash and blister could be related to that. Pt is now on clinda- will change to cefazolin PCN allergy when 81 yrs old- developed rash Will keep an eye when she is on cefazolin  Fading macular rash due to sulfa  Obesity with some venous edema legs   ? ___________________________________________________ Discussed with patient, requesting provider Note:  This document was prepared using Dragon voice recognition software and may include unintentional dictation  errors.

## 2021-07-04 NOTE — Assessment & Plan Note (Addendum)
BMI 49.05

## 2021-07-04 NOTE — Assessment & Plan Note (Deleted)
Continue apixaban 

## 2021-07-04 NOTE — H&P (Signed)
History and Physical    [name] VOH:[id number] DOB: 02/21/1941 DOA: 07/03/2021 DOS: the patient was seen and examined on 07/04/2021 PCP: Virginia Crews, MD  Patient coming from: Home  Chief Complaint:  Chief Complaint  Patient presents with   Cellulitis    HPI: Mary Kennedy is a 81 y.o. female with medical history significant of Atrial fibrillation on Eliquis, moderate to severe aortic stenosis, CKD 4, HTN, hypothyroidism, class III obesity, COPD, chronic combined CHF with chronic lower extremity edema with chronic lower extremity wounds, currently on metolazone and torsemide to control the swelling, who presents to the ED for the second time in a few days with a complaint of persistent swelling of the legs and worsening weeping of her wounds.  During her first visit, she had a negative lower extremity Doppler and was discharged with oral clindamycin after an initial IV dose.  She was previously treated with Bactrim.  She has history of swelling reaction to cephalosporins and penicillins.  She denies fever or chills.  ED course: On arrival BP 91/60 with pulse 59 and otherwise normal vitals Blood work significant for sodium of 130 and creatinine 2.38 which is her baseline WBC 6.8 and lactic acid 1.4 COVID and flu negative  Patient started on IV clindamycin.  Hospitalist consulted for admission.  Data Reviewed: Relevant notes from primary care and specialist visits, past discharge summaries as available in EHR, including Care Everywhere. Prior diagnostic testing as pertinent to current admission diagnoses Updated medications and problem lists for reconciliation ED course, including vitals, labs, imaging, treatment and response to treatment Triage notes, nursing and pharmacy notes and ED provider's notes Notable results as noted in HPI    Review of Systems: As mentioned in the history of present illness. All other systems reviewed and are negative. Past  Medical History:  Diagnosis Date   Allergy    Aortic stenosis    Atrial fibrillation (HCC)    Atypical squamous cells of undetermined significance on cytologic smear of vagina (ASC-US) 12/01/2014   s/p gynecology consultation 07/2010; s/p repeat pap smear with HPV.    Cellulitis    Heart murmur    Hyperlipidemia    Hypertension    Hypothyroidism    Thyroid disease    Wears dentures    partial lower, full upper   Past Surgical History:  Procedure Laterality Date   ABDOMINAL HYSTERECTOMY  1961   BIL Oophorectomy   APPENDECTOMY  at age 30   [place] N/A 01/18/2021   Procedure: CARDIOVERSION;  Surgeon: Yolonda Kida, MD;  Location: [facility];  Service: Cardiovascular;  Laterality: N/A;   CARDIOVERSION N/A 03/23/2021   Procedure: CARDIOVERSION;  Surgeon: Corey Skains, MD;  Location: [facility];  Service: Cardiovascular;  Laterality: N/A;   CATARACT EXTRACTION W/PHACO Left 09/01/2019   Procedure: CATARACT EXTRACTION PHACO AND INTRAOCULAR LENS PLACEMENT ([place]) LEFT 8.84 00:49.4;  Surgeon: Birder Robson, MD;  Location: [facility];  Service: Ophthalmology;  Laterality: Left;   CATARACT EXTRACTION W/PHACO Right 12/22/2019   Procedure: CATARACT EXTRACTION PHACO AND INTRAOCULAR LENS PLACEMENT ([facility]) RIGHT 6.77 00:42.9;  Surgeon: Birder Robson, MD;  Location: [facility];  Service: Ophthalmology;  Laterality: Right;   COLONOSCOPY WITH PROPOFOL N/A 08/27/2017   Procedure: COLONOSCOPY WITH PROPOFOL;  Surgeon: Lucilla Lame, MD;  Location: [facility];  Service: Endoscopy;  Laterality: N/A;   FOOT SURGERY Bilateral    RIGHT/LEFT  HEART CATH AND CORONARY ANGIOGRAPHY N/A 04/14/2021   Procedure: RIGHT/LEFT HEART CATH AND CORONARY ANGIOGRAPHY;  Surgeon: Yolonda Kida, MD;  Location: [facility];  Service: Cardiovascular;  Laterality: N/A;   TEE WITHOUT CARDIOVERSION N/A 03/23/2021    Procedure: TRANSESOPHAGEAL ECHOCARDIOGRAM (TEE);  Surgeon: Corey Skains, MD;  Location: [facility];  Service: Cardiovascular;  Laterality: N/A;   TONSILLECTOMY  at age 77   Social History:  reports that she quit smoking about 25 years ago. Her smoking use included cigarettes. She has never used smokeless tobacco. She reports that she does not currently use alcohol. She reports that she does not use drugs.  Allergies  Allergen Reactions   Lisinopril Anaphylaxis, Swelling and Cough    Close airway   Other Swelling    Persimmon causes mouth and throat swelling   Tegaderm Ag Mesh 2"X2"  [Wound Dressings]     when removing the patch her skin came off.   Wound Dressing Adhesive     Adhesive from covering for PICC line tore skin   Ciprofloxacin Hives and Rash   Latex Rash    Adhesive from covering for PICC line tore skin   Penicillins Swelling and Rash    Family History  Problem Relation Age of Onset   Pancreatitis Mother    Drug abuse Brother        PTSD   Cancer Brother        pancreas lesion   Colon cancer Brother    Heart attack Father    Lung cancer Brother    Cancer Brother        lung cancer   Breast cancer Cousin        2nd cousin    Prior to Admission medications   Medication Sig Start Date End Date Taking? Authorizing Provider  acetaminophen (TYLENOL) 650 MG CR tablet Take 650 mg by mouth every 8 (eight) hours as needed for pain.    [provider]  albuterol (VENTOLIN HFA) 108 (90 Base) MCG/ACT inhaler Inhale 2 puffs into the lungs every 6 (six) hours as needed for wheezing or shortness of breath.    [provider]  amiodarone (PACERONE) 400 MG tablet Take 200 mg by mouth daily in the afternoon.    [provider]  apixaban (ELIQUIS) 5 MG TABS tablet Take 5 mg by mouth 2 (two) times daily.    [provider]  atorvastatin (LIPITOR) 20 MG tablet Take 1 tablet (20 mg total) by mouth daily. Patient taking differently: Take 20 mg by  mouth at bedtime. 09/20/20   Virginia Crews, MD  Calcium Carb-Cholecalciferol (CALCIUM 600+D) 600-800 MG-UNIT TABS Take 2 tablets by mouth every evening.    [provider]  Chlorpheniramine Maleate (ALLERGY RELIEF PO) Take by mouth.    [provider]  Cholecalciferol (VITAMIN D3) 50 MCG (2000 UT) capsule Take 2,000 Units by mouth daily.    [provider]  clindamycin (CLEOCIN) 150 MG capsule Take 3 capsules (450 mg total) by mouth 3 (three) times daily for 7 days. 07/01/21 07/08/21  Arta Silence, MD  Dermatological Products, Misc. (BURN SPRAY EX) Apply topically.    [provider]  diphenhydrAMINE HCl (BENADRYL PO) Take by mouth.    [provider]  furosemide (LASIX) 20 MG tablet Take 1 tablet (20 mg total) by mouth daily. Patient taking differently: Take 40 mg by mouth daily. 09/20/20   Virginia Crews, MD  hydrochlorothiazide (HYDRODIURIL) 12.5 MG tablet Take 1  tablet (12.5 mg total) by mouth daily. 09/20/20   Virginia Crews, MD  levothyroxine (SYNTHROID) 125 MCG tablet TAKE 1 TABLET BY MOUTH  DAILY BEFORE BREAKFAST 11/17/20   Virginia Crews, MD  losartan (COZAAR) 100 MG tablet TAKE 1 TABLET BY MOUTH  DAILY 05/17/21   Bacigalupo, Dionne Bucy, MD  metoprolol succinate (TOPROL-XL) 50 MG 24 hr tablet Take 0.5 tablets (25 mg total) by mouth daily. 03/23/21   Corey Skains, MD  metroNIDAZOLE (METROGEL) 0.75 % gel Apply 1 application topically 2 (two) times daily. 06/02/21   Virginia Crews, MD  MULTIPLE VITAMIN PO Take 1 tablet by mouth daily. Centrum 50 + 04/20/08   [provider]  Multiple Vitamins-Minerals (OCUVITE ADULT 50+) CAPS Take 1 capsule by mouth daily.    [provider]  OVER THE COUNTER MEDICATION Take 1 Scoop by mouth daily. Super Beets in glass of water 8 oz    [provider]  potassium chloride SA (KLOR-CON) 20 MEQ tablet TAKE 1 TABLET BY MOUTH  DAILY WITH LASIX 09/20/20   Bacigalupo,  Dionne Bucy, MD  spironolactone (ALDACTONE) 25 MG tablet Take 25 mg by mouth daily.    [provider]  sulfamethoxazole-trimethoprim (BACTRIM DS) 800-160 MG tablet Take 1 tablet by mouth 2 (two) times daily. For 7 days    [provider]  triamcinolone (KENALOG) 0.1 % APPLY TOPICALLY TWO TIMES  DAILY 04/21/20   Virginia Crews, MD  Zinc 50 MG CAPS Take 50 mg by mouth daily.    [provider]    Physical Exam: Vitals:   07/03/21 1503 07/03/21 1833 07/04/21 0048  BP: 91/60 (!) 116/37 114/65  Pulse: (!) 59 (!) 58 (!) 56  Resp: 20 16 16   Temp: 98.5 F (36.9 C)  97.9 F (36.6 C)  TempSrc: Oral    SpO2: 99% 98% 97%  Weight: 125.6 kg    Height: 5\' 3"  (1.6 m)     Physical Exam Vitals and nursing note reviewed.  Constitutional:      General: She is not in acute distress.    Appearance: Normal appearance. She is obese.  HENT:     Head: Normocephalic and atraumatic.  Cardiovascular:     Rate and Rhythm: Normal rate and regular rhythm.     Pulses: Normal pulses.     Heart sounds: Normal heart sounds. No murmur heard. Pulmonary:     Effort: Pulmonary effort is normal.     Breath sounds: Normal breath sounds. No wheezing or rhonchi.  Abdominal:     General: Bowel sounds are normal.     Palpations: Abdomen is soft.     Tenderness: There is no abdominal tenderness.  Musculoskeletal:        General: Swelling and tenderness present. Normal range of motion.     Cervical back: Normal range of motion and neck supple.  Skin:    General: Skin is warm and dry.     Findings: Erythema present.     Comments: Redness and warmth over bilateral lower legs with blistering with some deroofed bullae  Neurological:     General: No focal deficit present.     Mental Status: She is alert. Mental status is at baseline.  Psychiatric:        Mood and Affect: Mood normal.        Behavior: Behavior normal.     Data Reviewed:   Assessment and Plan: * Cellulitis- (present  on admission) Continue IV clindamycin given  history of swelling allergy to penicillins Keep legs elevated Wound care consult  Chronic anticoagulation Continue apixaban  Atrial fibrillation ([place])- (present on admission) Rate controlled.  Continue metoprolol, amiodarone and and apixaban  Aortic stenosis Moderate to severe Followed by cardiology.  Last seen 2/8 with discussion of referral for TAVR  CKD (chronic kidney disease) stage 4, GFR 15-29 ml/min [facility]) Consider nephrology consult  Chronic combined systolic and diastolic heart failure ([place])- (present on admission) Mild fluid overload Continue home Lasix, spironolactone, metolazone and losartan and monitor renal function Continue metoprolol  Obesity, Class III, BMI 40-49.9 (morbid obesity) ([place]) Complicating factor to overall prognosis and care  Adult hypothyroidism- (present on admission) Continue levothyroxine       Advance Care Planning:   Code Status: Full Code   Consults: none  Family Communication: none  Severity of Illness: The appropriate patient status for this patient is INPATIENT. Inpatient status is judged to be reasonable and necessary in order to provide the required intensity of service to ensure the patient's safety. The patient's presenting symptoms, physical exam findings, and initial radiographic and laboratory data in the context of their chronic comorbidities is felt to place them at high risk for further clinical deterioration. Furthermore, it is not anticipated that the patient will be medically stable for discharge from the hospital within 2 midnights of admission.   * I certify that at the point of admission it is my clinical judgment that the patient will require inpatient hospital care spanning beyond 2 midnights from the point of admission due to high intensity of service, high risk for further deterioration and high frequency of surveillance required.*  Author: Athena Masse, MD 07/04/2021  1:25 AM  For on call review [email or web address].

## 2021-07-04 NOTE — Assessment & Plan Note (Signed)
Generalized rash seen on abdomen and arms.  Likely secondary to Bactrim that was recently started for lower extremity cellulitis.  Continue to monitor.

## 2021-07-04 NOTE — TOC Progression Note (Signed)
Transition of Care [facility]) - Progression Note    Patient Details  Name: DONTAVIA BRAND MRN: [id number] Date of Birth: 07/07/1940  Transition of Care [facility]) CM/SW [place], RN Phone Number: 07/04/2021, 12:54 PM  Clinical Narrative:    Transition of Care [facility]) Screening Note   Patient Details  Name: BERDINA CHEEVER Date of Birth: 1940-07-27   Transition of Care [facility]) CM/SW Contact:    Conception Oms, RN Phone Number: 07/04/2021, 12:54 PM  From home, has PCP,  Gets meds thru [facility] or [facility]  Transition of Care Department [facility]) has reviewed patient and no TOC needs have been identified at this time. We will continue to monitor patient advancement through interdisciplinary progression rounds. If new patient transition needs arise, please place a TOC consult.           Expected Discharge Plan and Services                                                 Social Determinants of Health (SDOH) Interventions    Readmission Risk Interventions No flowsheet data found.

## 2021-07-04 NOTE — Assessment & Plan Note (Addendum)
Paroxysmal in nature.  Rate controlled.  Continue metoprolol, amiodarone and and apixaban

## 2021-07-04 NOTE — Assessment & Plan Note (Addendum)
Bilateral lower extremity blistering cellulitis.  With multiple drug allergies I did get infectious disease consultation.  Currently on clindamycin.  Elevation of the legs.

## 2021-07-04 NOTE — Assessment & Plan Note (Signed)
Moderate to severe Followed by cardiology.  Last seen 2/8 with discussion of referral for TAVR

## 2021-07-05 DIAGNOSIS — L270 Generalized skin eruption due to drugs and medicaments taken internally: Principal | ICD-10-CM

## 2021-07-05 DIAGNOSIS — L03119 Cellulitis of unspecified part of limb: Secondary | ICD-10-CM

## 2021-07-05 MED ORDER — CEFAZOLIN SODIUM-DEXTROSE 1-4 GM/50ML-% IV SOLN: 1.0000 g | Freq: Three times a day (TID) | INTRAVENOUS | Status: DC

## 2021-07-05 MED ORDER — HYDROCORTISONE 0.5 % EX CREA
TOPICAL_CREAM | Freq: Three times a day (TID) | CUTANEOUS | Status: DC
  Filled 2021-07-05 (×3): qty 28.35

## 2021-07-05 MED ORDER — CEFAZOLIN SODIUM-DEXTROSE 1-4 GM/50ML-% IV SOLN
1.0000 g | Freq: Three times a day (TID) | INTRAVENOUS | Status: DC
  Administered 2021-07-05 – 2021-07-06 (×3): 1 g via INTRAVENOUS
  Filled 2021-07-05 (×5): qty 50

## 2021-07-05 NOTE — Plan of Care (Signed)
°  Problem: Clinical Measurements: Goal: Ability to avoid or minimize complications of infection will improve 07/05/2021 2029 by Derrek Monaco, RN Outcome: Progressing 07/05/2021 2028 by Derrek Monaco, RN Outcome: Progressing   Problem: Skin Integrity: Goal: Skin integrity will improve 07/05/2021 2029 by Derrek Monaco, RN Outcome: Progressing 07/05/2021 2028 by Derrek Monaco, RN Outcome: Progressing   Problem: Education: Goal: Knowledge of General Education information will improve Description: Including pain rating scale, medication(s)/side effects and non-pharmacologic comfort measures Outcome: Progressing   Problem: Health Behavior/Discharge Planning: Goal: Ability to manage health-related needs will improve Outcome: Progressing   Problem: Clinical Measurements: Goal: Ability to maintain clinical measurements within normal limits will improve Outcome: Progressing Goal: Will remain free from infection Outcome: Progressing Goal: Diagnostic test results will improve Outcome: Progressing Goal: Respiratory complications will improve Outcome: Progressing Goal: Cardiovascular complication will be avoided Outcome: Progressing   Problem: Activity: Goal: Risk for activity intolerance will decrease Outcome: Progressing   Problem: Nutrition: Goal: Adequate nutrition will be maintained Outcome: Progressing   Problem: Coping: Goal: Level of anxiety will decrease Outcome: Progressing   Problem: Elimination: Goal: Will not experience complications related to bowel motility Outcome: Progressing Goal: Will not experience complications related to urinary retention Outcome: Progressing   Problem: Pain Managment: Goal: General experience of comfort will improve Outcome: Progressing   Problem: Safety: Goal: Ability to remain free from injury will improve Outcome: Progressing   Problem: Skin Integrity: Goal: Risk for impaired skin integrity will  decrease Outcome: Progressing

## 2021-07-05 NOTE — Progress Notes (Signed)
ID Pt feeling same Pain leg Oozing No fever Tolerating cefazolin  O/e awake and alert Ambulating Chest cta Hss1s Abd soft Skin- fading macular erythematous rash Legs less red- blisters present    Labs CBC Latest Ref Rng & Units 07/03/2021 07/01/2021 10/19/2019  WBC 4.0 - 10.5 K/uL 6.8 6.3 6.7  Hemoglobin 12.0 - 15.0 g/dL 11.4(L) 11.7(L) 13.4  Hematocrit 36.0 - 46.0 % 36.3 37.4 40.8  Platelets 150 - 400 K/uL 245 263 245    CMP Latest Ref Rng & Units 07/03/2021 07/01/2021 06/05/2021  Glucose 70 - 99 mg/dL 115(H) 107(H) -  BUN 8 - 23 mg/dL 37(H) 40(H) -  Creatinine 0.44 - 1.00 mg/dL 2.38(H) 2.35(H) -  Sodium 135 - 145 mmol/L 130(L) 135 -  Potassium 3.5 - 5.1 mmol/L 4.9 4.6 -  Chloride 98 - 111 mmol/L 96(L) 97(L) -  CO2 22 - 32 mmol/L 24 26 -  Calcium 8.9 - 10.3 mg/dL 9.1 9.7 -  Total Protein 6.5 - 8.1 g/dL - 6.9 6.2  Total Bilirubin 0.3 - 1.2 mg/dL - 0.7 1.0  Alkaline Phos 38 - 126 U/L - 67 66  AST 15 - 41 U/L - 31 20  ALT 0 - 44 U/L - 27 24    Impression/recommendation Blistering erythematous lesions legs- could have started like cellulitis rt leg but now linvolves both legs which makes it unusual favoring drug allergy to bactrim Currently on cefazolin Some improvement in the erythema today Need wound care consult  Macular erythematous rash body from sulfa allergy  Discussed the management with the patient

## 2021-07-05 NOTE — Progress Notes (Signed)
PROGRESS NOTE    Mary BOHNSACK  NTI:[id number] DOB: 24-Sep-1940 DOA: 07/03/2021 PCP: Virginia Crews, MD    Assessment & Plan:   Principal Problem:   Cellulitis Active Problems:   Adult hypothyroidism   Obesity, Class III, BMI 40-49.9 (morbid obesity) (HCC)   Atrial fibrillation (HCC)   Chronic combined systolic and diastolic heart failure (HCC)   CKD (chronic kidney disease) stage 4, GFR 15-29 ml/min (HCC)   Aortic stenosis   Drug rash  Cellulitis of b/l LE: w/ blisters. Hx of multiple drug allergies. Continue on IV cefazolin as per ID  Drug rash: on abd, arms. D/c bactrim. Benadryl prn for itching    Chronic combined CHF: Continue on IV lasix, spironolactone, losartan & metoprolol    Morbid Obesity: BMI 49.0. Complicates overall care & prognosis    Aortic stenosis: mod-severe. Last seen 2/8 with discussion of referral for TAVR. Will f/u w/ cardio outpatient    CKDIV: Cr is labile. Will continue to monitor  Hyponatremia: labile. Will continue to monitor    PAF: continue on metoprolol, amiodarone & eliquis.    Hypothyroidism: continue on home dose of levothyroxine    DVT prophylaxis: eliquis  Code Status: full  Family Communication:  Disposition Plan: likely d/c back home   Level of care: Med-Surg  Status is: Inpatient Remains inpatient appropriate because: still w/ significant cellulitis, on IV abxs    Consultants:  ID  Procedures:   Antimicrobials: cefazolin    Subjective: Pt c/o b/l LE pain   Objective: Vitals:   07/04/21 1553 07/04/21 1949 07/05/21 0227 07/05/21 0726  BP: (!) 128/39 (!) 133/48 (!) 127/35 (!) 153/55  Pulse: (!) 52 (!) 52 (!) 56 (!) 52  Resp: 16 15 17 16   Temp: 97.9 F (36.6 C) 98 F (36.7 C) 97.8 F (36.6 C) (!) 97.5 F (36.4 C)  TempSrc:      SpO2: 97% 94% 98% 100%  Weight:      Height:        Intake/Output Summary (Last 24 hours) at 07/05/2021 0856 Last data filed at 07/04/2021 1500 Gross per 24 hour  Intake  290 ml  Output --  Net 290 ml   Filed Weights   07/03/21 1503  Weight: 125.6 kg    Examination:  General exam: Appears calm but uncomfortable  Respiratory system: Clear to auscultation. Respiratory effort normal. Cardiovascular system: S1 & S2+. No  rubs, gallops or clicks.  Gastrointestinal system: Abdomen is obese, soft and nontender. Hypoactive bowel sounds heard. Central nervous system: Alert and oriented. Moves all extremities  Extremities: b/l LE edema w/ blisters  Psychiatry: Judgement and insight appear normal. Flat mood and affect    Data Reviewed: I have personally reviewed following labs and imaging studies  CBC: Recent Labs  Lab 07/01/21 1400 07/03/21 1511  WBC 6.3 6.8  NEUTROABS 3.3 4.6  HGB 11.7* 11.4*  HCT 37.4 36.3  MCV 98.9 97.3  PLT 263 867   Basic Metabolic Panel: Recent Labs  Lab 07/01/21 1400 07/03/21 1511  NA 135 130*  K 4.6 4.9  CL 97* 96*  CO2 26 24  GLUCOSE 107* 115*  BUN 40* 37*  CREATININE 2.35* 2.38*  CALCIUM 9.7 9.1   GFR: Estimated Creatinine Clearance: 24.7 mL/min (A) (by C-G formula based on SCr of 2.38 mg/dL (H)). Liver Function Tests: Recent Labs  Lab 07/01/21 1400  AST 31  ALT 27  ALKPHOS 67  BILITOT 0.7  PROT 6.9  ALBUMIN 4.1  No results for input(s): LIPASE, AMYLASE in the last 168 hours. No results for input(s): AMMONIA in the last 168 hours. Coagulation Profile: No results for input(s): INR, PROTIME in the last 168 hours. Cardiac Enzymes: No results for input(s): CKTOTAL, CKMB, CKMBINDEX, TROPONINI in the last 168 hours. BNP (last 3 results) No results for input(s): PROBNP in the last 8760 hours. HbA1C: No results for input(s): HGBA1C in the last 72 hours. CBG: No results for input(s): GLUCAP in the last 168 hours. Lipid Profile: No results for input(s): CHOL, HDL, LDLCALC, TRIG, CHOLHDL, LDLDIRECT in the last 72 hours. Thyroid Function Tests: No results for input(s): TSH, T4TOTAL, FREET4, T3FREE,  THYROIDAB in the last 72 hours. Anemia Panel: No results for input(s): VITAMINB12, FOLATE, FERRITIN, TIBC, IRON, RETICCTPCT in the last 72 hours. Sepsis Labs: Recent Labs  Lab 07/01/21 1400 07/03/21 1511  LATICACIDVEN 1.9 1.4    Recent Results (from the past 240 hour(s))  Resp Panel by RT-PCR (Flu A&B, Covid) Nasopharyngeal Swab     Status: None   Collection Time: 07/03/21 10:17 PM   Specimen: Nasopharyngeal Swab; Nasopharyngeal(NP) swabs in vial transport medium  Result Value Ref Range Status   SARS Coronavirus 2 by RT PCR NEGATIVE NEGATIVE Final    Comment: (NOTE) SARS-CoV-2 target nucleic acids are NOT DETECTED.  The SARS-CoV-2 RNA is generally detectable in upper respiratory specimens during the acute phase of infection. The lowest concentration of SARS-CoV-2 viral copies this assay can detect is 138 copies/mL. A negative result does not preclude SARS-Cov-2 infection and should not be used as the sole basis for treatment or other patient management decisions. A negative result may occur with  improper specimen collection/handling, submission of specimen other than nasopharyngeal swab, presence of viral mutation(s) within the areas targeted by this assay, and inadequate number of viral copies(<138 copies/mL). A negative result must be combined with clinical observations, patient history, and epidemiological information. The expected result is Negative.  Fact Sheet for Patients:  EntrepreneurPulse.com.au  Fact Sheet for Healthcare Providers:  IncredibleEmployment.be  This test is no t yet approved or cleared by the [place] FDA and  has been authorized for detection and/or diagnosis of SARS-CoV-2 by FDA under an Emergency Use Authorization (EUA). This EUA will remain  in effect (meaning this test can be used) for the duration of the COVID-19 declaration under Section 564(b)(1) of the Act, 21 U.S.C.section 360bbb-3(b)(1), unless the  authorization is terminated  or revoked sooner.       Influenza A by PCR NEGATIVE NEGATIVE Final   Influenza B by PCR NEGATIVE NEGATIVE Final    Comment: (NOTE) The Xpert Xpress SARS-CoV-2/FLU/RSV plus assay is intended as an aid in the diagnosis of influenza from Nasopharyngeal swab specimens and should not be used as a sole basis for treatment. Nasal washings and aspirates are unacceptable for Xpert Xpress SARS-CoV-2/FLU/RSV testing.  Fact Sheet for Patients: EntrepreneurPulse.com.au  Fact Sheet for Healthcare Providers: IncredibleEmployment.be  This test is not yet approved or cleared by the [place] FDA and has been authorized for detection and/or diagnosis of SARS-CoV-2 by FDA under an Emergency Use Authorization (EUA). This EUA will remain in effect (meaning this test can be used) for the duration of the COVID-19 declaration under Section 564(b)(1) of the Act, 21 U.S.C. section 360bbb-3(b)(1), unless the authorization is terminated or revoked.  Performed at [facility], [address], [place], [place] [address]          Radiology Studies: No results found.  Scheduled Meds:  amiodarone  200 mg Oral Q1500   apixaban  5 mg Oral BID   furosemide  40 mg Intravenous BID   levothyroxine  125 mcg Oral Q0600   losartan  100 mg Oral Daily   metoprolol succinate  25 mg Oral Daily   spironolactone  25 mg Oral Daily   zinc sulfate  220 mg Oral Daily   Continuous Infusions:   ceFAZolin (ANCEF) IV       LOS: 2 days    Time spent: 23 mins     Wyvonnia Dusky, MD [facility] Pager [phone]  If 7PM-7AM, please contact night-coverage 07/05/2021, 8:56 AM

## 2021-07-05 NOTE — Plan of Care (Signed)
?  Problem: Clinical Measurements: ?Goal: Ability to avoid or minimize complications of infection will improve ?Outcome: Progressing ?  ?Problem: Skin Integrity: ?Goal: Skin integrity will improve ?Outcome: Progressing ?  ?

## 2021-07-06 DIAGNOSIS — N184 Chronic kidney disease, stage 4 (severe): Secondary | ICD-10-CM

## 2021-07-06 LAB — BASIC METABOLIC PANEL
Anion gap: 9 (ref 5–15)
BUN: 37 mg/dL — ABNORMAL HIGH (ref 8–23)
CO2: 26 mmol/L (ref 22–32)
Calcium: 8.7 mg/dL — ABNORMAL LOW (ref 8.9–10.3)
Chloride: 96 mmol/L — ABNORMAL LOW (ref 98–111)
Creatinine, Ser: 2.19 mg/dL — ABNORMAL HIGH (ref 0.44–1.00)
GFR, Estimated: 22 mL/min — ABNORMAL LOW (ref >60)
Glucose, Bld: 104 mg/dL — ABNORMAL HIGH (ref 70–99)
Potassium: 4.2 mmol/L (ref 3.5–5.1)
Sodium: 131 mmol/L — ABNORMAL LOW (ref 135–145)

## 2021-07-06 LAB — CBC
HCT: 34.6 % — ABNORMAL LOW (ref 36.0–46.0)
Hemoglobin: 11.1 g/dL — ABNORMAL LOW (ref 12.0–15.0)
MCH: 30.9 pg (ref 26.0–34.0)
MCHC: 32.1 g/dL (ref 30.0–36.0)
MCV: 96.4 fL (ref 80.0–100.0)
Platelets: 237 10*3/uL (ref 150–400)
RBC: 3.59 MIL/uL — ABNORMAL LOW (ref 3.87–5.11)
RDW: 14.1 % (ref 11.5–15.5)
WBC: 7.5 10*3/uL (ref 4.0–10.5)
nRBC: 0 % (ref 0.0–0.2)

## 2021-07-06 MED ORDER — HYDROCORTISONE 1 % EX CREA
TOPICAL_CREAM | Freq: Three times a day (TID) | CUTANEOUS | Status: DC
  Filled 2021-07-06: qty 28

## 2021-07-06 MED ORDER — DIPHENHYDRAMINE HCL 50 MG/ML IJ SOLN
12.5000 mg | Freq: Four times a day (QID) | INTRAMUSCULAR | Status: AC | PRN
  Administered 2021-07-06 (×2): 12.5 mg via INTRAVENOUS
  Filled 2021-07-06 (×2): qty 1

## 2021-07-06 MED ORDER — HYDROXYZINE HCL 25 MG PO TABS
25.0000 mg | ORAL_TABLET | Freq: Three times a day (TID) | ORAL | Status: DC
Start: 2021-07-06 — End: 2021-07-08
  Administered 2021-07-06 – 2021-07-08 (×7): 25 mg via ORAL
  Filled 2021-07-06 (×9): qty 1

## 2021-07-06 MED ORDER — CALAMINE EX LOTN
1.0000 | TOPICAL_LOTION | Freq: Two times a day (BID) | CUTANEOUS | Status: DC
Start: 2021-07-06 — End: 2021-07-08
  Administered 2021-07-06 – 2021-07-08 (×4): 1 via TOPICAL
  Filled 2021-07-06 (×2): qty 177

## 2021-07-06 NOTE — Plan of Care (Signed)

## 2021-07-06 NOTE — Progress Notes (Signed)
PROGRESS NOTE    Mary Kennedy  [id number] DOB: 07-25-1940 DOA: 07/03/2021 PCP: Virginia Crews, MD    Assessment & Plan:   Principal Problem:   Cellulitis Active Problems:   Adult hypothyroidism   Obesity, Class III, BMI 40-49.9 (morbid obesity) (HCC)   Atrial fibrillation (HCC)   Chronic combined systolic and diastolic heart failure (HCC)   CKD (chronic kidney disease) stage 4, GFR 15-29 ml/min (HCC)   Aortic stenosis   Drug rash  Cellulitis of b/l LE: w/ blisters. Hx of multiple drug allergies. Continue on IV ancef as per ID. Continue w/ wound care   Drug rash: on abd, arms. Worse as per pt today. D/c bactrim. Benadryl, hydroxyzine prn for itching    Chronic combined CHF: Continue on IV lasix, spironolactone, losartan & metoprolol    Morbid Obesity: BMI 49.0. Complicates overall care & prognosis    Aortic stenosis: mod-severe. Last seen 2/8 with discussion of referral for TAVR. Will f/u w/ cardio outpatient    CKDIV: Cr is trending down today. Avoid nephrotoxic meds   Hyponatremia: trending up today    PAF: continue on amiodarone, metoprolol & eliquis    Hypothyroidism: continue on home dose of levothyroxine    DVT prophylaxis: eliquis  Code Status: full  Family Communication:  Disposition Plan: likely d/c back home   Level of care: Med-Surg  Status is: Inpatient Remains inpatient appropriate because: still w/ significant cellulitis & likely drug rash, on IV abxs    Consultants:  ID  Procedures:   Antimicrobials: cefazolin    Subjective: Pt c/o itching all over  Objective: Vitals:   07/05/21 1531 07/05/21 1949 07/06/21 0521 07/06/21 0738  BP: (!) 111/30 (!) 146/36 (!) 156/43 (!) 141/48  Pulse: (!) 50 (!) 48 (!) 52 (!) 50  Resp: 16 20 18 18   Temp: (!) 97.3 F (36.3 C) 97.6 F (36.4 C) 98.6 F (37 C) 97.7 F (36.5 C)  TempSrc:      SpO2: 97% 97% 98% 97%  Weight:      Height:        Intake/Output Summary (Last 24 hours) at  07/06/2021 5329 Last data filed at 07/06/2021 0530 Gross per 24 hour  Intake --  Output 400 ml  Net -400 ml   Filed Weights   07/03/21 1503  Weight: 125.6 kg    Examination:  General exam: Appears uncomfortable  Respiratory system: clear breath sounds b/l  Cardiovascular system: S1/S2+. No rubs or gallops.  Gastrointestinal system: Abd is soft, NT, obese & normal bowel sounds  Central nervous system: alert and oriented. Moves all extremities   Extremities: b/l LE are dressed and dressing is C/D/I  Psychiatry: judgement and insight appear normal. Flat mood and affect     Data Reviewed: I have personally reviewed following labs and imaging studies  CBC: Recent Labs  Lab 07/01/21 1400 07/03/21 1511 07/06/21 0259  WBC 6.3 6.8 7.5  NEUTROABS 3.3 4.6  --   HGB 11.7* 11.4* 11.1*  HCT 37.4 36.3 34.6*  MCV 98.9 97.3 96.4  PLT 263 245 924   Basic Metabolic Panel: Recent Labs  Lab 07/01/21 1400 07/03/21 1511 07/06/21 0259  NA 135 130* 131*  K 4.6 4.9 4.2  CL 97* 96* 96*  CO2 26 24 26   GLUCOSE 107* 115* 104*  BUN 40* 37* 37*  CREATININE 2.35* 2.38* 2.19*  CALCIUM 9.7 9.1 8.7*   GFR: Estimated Creatinine Clearance: 26.9 mL/min (A) (by C-G formula based on SCr of  2.19 mg/dL (H)). Liver Function Tests: Recent Labs  Lab 07/01/21 1400  AST 31  ALT 27  ALKPHOS 67  BILITOT 0.7  PROT 6.9  ALBUMIN 4.1   No results for input(s): LIPASE, AMYLASE in the last 168 hours. No results for input(s): AMMONIA in the last 168 hours. Coagulation Profile: No results for input(s): INR, PROTIME in the last 168 hours. Cardiac Enzymes: No results for input(s): CKTOTAL, CKMB, CKMBINDEX, TROPONINI in the last 168 hours. BNP (last 3 results) No results for input(s): PROBNP in the last 8760 hours. HbA1C: No results for input(s): HGBA1C in the last 72 hours. CBG: No results for input(s): GLUCAP in the last 168 hours. Lipid Profile: No results for input(s): CHOL, HDL, LDLCALC, TRIG,  CHOLHDL, LDLDIRECT in the last 72 hours. Thyroid Function Tests: No results for input(s): TSH, T4TOTAL, FREET4, T3FREE, THYROIDAB in the last 72 hours. Anemia Panel: No results for input(s): VITAMINB12, FOLATE, FERRITIN, TIBC, IRON, RETICCTPCT in the last 72 hours. Sepsis Labs: Recent Labs  Lab 07/01/21 1400 07/03/21 1511  LATICACIDVEN 1.9 1.4    Recent Results (from the past 240 hour(s))  Resp Panel by RT-PCR (Flu A&B, Covid) Nasopharyngeal Swab     Status: None   Collection Time: 07/03/21 10:17 PM   Specimen: Nasopharyngeal Swab; Nasopharyngeal(NP) swabs in vial transport medium  Result Value Ref Range Status   SARS Coronavirus 2 by RT PCR NEGATIVE NEGATIVE Final    Comment: (NOTE) SARS-CoV-2 target nucleic acids are NOT DETECTED.  The SARS-CoV-2 RNA is generally detectable in upper respiratory specimens during the acute phase of infection. The lowest concentration of SARS-CoV-2 viral copies this assay can detect is 138 copies/mL. A negative result does not preclude SARS-Cov-2 infection and should not be used as the sole basis for treatment or other patient management decisions. A negative result may occur with  improper specimen collection/handling, submission of specimen other than nasopharyngeal swab, presence of viral mutation(s) within the areas targeted by this assay, and inadequate number of viral copies(<138 copies/mL). A negative result must be combined with clinical observations, patient history, and epidemiological information. The expected result is Negative.  Fact Sheet for Patients:  EntrepreneurPulse.com.au  Fact Sheet for Healthcare Providers:  IncredibleEmployment.be  This test is no t yet approved or cleared by the [place] FDA and  has been authorized for detection and/or diagnosis of SARS-CoV-2 by FDA under an Emergency Use Authorization (EUA). This EUA will remain  in effect (meaning this test can be used) for  the duration of the COVID-19 declaration under Section 564(b)(1) of the Act, 21 U.S.C.section 360bbb-3(b)(1), unless the authorization is terminated  or revoked sooner.       Influenza A by PCR NEGATIVE NEGATIVE Final   Influenza B by PCR NEGATIVE NEGATIVE Final    Comment: (NOTE) The Xpert Xpress SARS-CoV-2/FLU/RSV plus assay is intended as an aid in the diagnosis of influenza from Nasopharyngeal swab specimens and should not be used as a sole basis for treatment. Nasal washings and aspirates are unacceptable for Xpert Xpress SARS-CoV-2/FLU/RSV testing.  Fact Sheet for Patients: EntrepreneurPulse.com.au  Fact Sheet for Healthcare Providers: IncredibleEmployment.be  This test is not yet approved or cleared by the [place] FDA and has been authorized for detection and/or diagnosis of SARS-CoV-2 by FDA under an Emergency Use Authorization (EUA). This EUA will remain in effect (meaning this test can be used) for the duration of the COVID-19 declaration under Section 564(b)(1) of the Act, 21 U.S.C. section 360bbb-3(b)(1), unless the authorization is  terminated or revoked.  Performed at [facility], [address] [place],  [address]          Radiology Studies: No results found.      Scheduled Meds:  amiodarone  200 mg Oral Q1500   apixaban  5 mg Oral BID   furosemide  40 mg Intravenous BID   hydrocortisone cream   Topical TID   hydrOXYzine  25 mg Oral TID   levothyroxine  125 mcg Oral Q0600   losartan  100 mg Oral Daily   metoprolol succinate  25 mg Oral Daily   spironolactone  25 mg Oral Daily   zinc sulfate  220 mg Oral Daily   Continuous Infusions:   ceFAZolin (ANCEF) IV 1 g (07/06/21 0213)     LOS: 3 days    Time spent: 25 mins     Wyvonnia Dusky, MD [facility] Pager [phone]  If 7PM-7AM, please contact night-coverage 07/06/2021, 8:22 AM

## 2021-07-06 NOTE — Progress Notes (Signed)
ID Pt is complaining of worsening rash, itching No fever at all Seen by wound consultant today  O/e Pt sleepy with benadryl Patient Vitals for the past 24 hrs:  BP Temp Pulse Resp SpO2  07/06/21 1605 (!) 151/49 97.8 F (36.6 C) (!) 55 18 90 %  07/06/21 1125 (!) 136/42 (!) 97 F (36.1 C) (!) 50 18 100 %  07/06/21 0738 (!) 141/48 97.7 F (36.5 C) (!) 50 18 97 %  07/06/21 0521 (!) 156/43 98.6 F (37 C) (!) 52 18 98 %  07/05/21 1949 (!) 146/36 97.6 F (36.4 C) (!) 48 20 97 %    But wakes up and is alert and coherent Chest b/l air entry Hss1s2 Abd soft CNS non focal Skin- erythematous macular rash over chest , arms, thighs- worse than before 07/06/21       07/04/21   B/l lower extremity erythema and blistering    Labs CBC Latest Ref Rng & Units 07/06/2021 07/03/2021 07/01/2021  WBC 4.0 - 10.5 K/uL 7.5 6.8 6.3  Hemoglobin 12.0 - 15.0 g/dL 11.1(L) 11.4(L) 11.7(L)  Hematocrit 36.0 - 46.0 % 34.6(L) 36.3 37.4  Platelets 150 - 400 K/uL 237 245 263    CMP Latest Ref Rng & Units 07/06/2021 07/03/2021 07/01/2021  Glucose 70 - 99 mg/dL 104(H) 115(H) 107(H)  BUN 8 - 23 mg/dL 37(H) 37(H) 40(H)  Creatinine 0.44 - 1.00 mg/dL 2.19(H) 2.38(H) 2.35(H)  Sodium 135 - 145 mmol/L 131(L) 130(L) 135  Potassium 3.5 - 5.1 mmol/L 4.2 4.9 4.6  Chloride 98 - 111 mmol/L 96(L) 96(L) 97(L)  CO2 22 - 32 mmol/L 26 24 26   Calcium 8.9 - 10.3 mg/dL 8.7(L) 9.1 9.7  Total Protein 6.5 - 8.1 g/dL - - 6.9  Total Bilirubin 0.3 - 1.2 mg/dL - - 0.7  Alkaline Phos 38 - 126 U/L - - 67  AST 15 - 41 U/L - - 31  ALT 0 - 44 U/L - - 27     Impression/recommendation  B/l leg erythema and edema and blisters Cellulitis VS drug allergy on baseline  edema legs B/l cellulitis is uncommon so I would think this goes in favor of edema and drug allergy She had received bactrim as OP and developed a drug rash but no change in erythema She got 2 days of clinda with no difference She then was started on cefazolin on 2/21  but itching and rash worse this morning So concern for cefazolin allergy Will stop antibiotics as they have not really made a difference Observe off antibiotics as clinically stable Wound consultant seen patient and leg has been wrapped-  Drug rash- bactrim and now could be cefazolin Calamine lotion topically  Heart failure on presentation- on lasix, spirinolactone, toprol, losartan  Obesity  Moderate mitral and aortic stenosis -   CKD  Afib paroxysmal- on amiodarone, metoprolol and apixiban Hypothyroidism on synthroid  Discussed the management with the patient, her nurse and hospitalist

## 2021-07-06 NOTE — Consult Note (Signed)
[facility] Nurse Consult Note: Reason for Consult: Consult requested for bilat legs.  Pt developed generalized edema and erythemia to bilat legs last week and the cellulitis has evolved into patchy areas of full thickness skin loss.  Wound type: Left leg as described above, with red moist full thickness wounds separated by narrow bridges of intact skin and surrounded by loose peeling skin and mod amt yellow drainage. Affected area is approx 11X6X.2cm to anterior calf. Right leg with same appearance, except both anterior and posterior legs are affected, approx 40X40X.2cm.  Dressing procedure/placement/frequency: Topical treatment orders provided for bedside nurses to perform as follows to promote drying and healing and reduce edema: Bedside nurse:  apply Xeroform gauze to open weeping areas on bilat legs Q day, then cover with ABD pads and kerlex and ace wraps. Pt could benefit from follow-up by home health nursing for dressing change assistance after discharge; please order if desired. Please re-consult if further assistance is needed.  Thank-you,  Julien Girt MSN, [place], [place], [place], [place]

## 2021-07-07 DIAGNOSIS — I480 Paroxysmal atrial fibrillation: Secondary | ICD-10-CM

## 2021-07-07 LAB — BASIC METABOLIC PANEL
Anion gap: 9 (ref 5–15)
BUN: 41 mg/dL — ABNORMAL HIGH (ref 8–23)
CO2: 25 mmol/L (ref 22–32)
Calcium: 8.8 mg/dL — ABNORMAL LOW (ref 8.9–10.3)
Chloride: 97 mmol/L — ABNORMAL LOW (ref 98–111)
Creatinine, Ser: 2.15 mg/dL — ABNORMAL HIGH (ref 0.44–1.00)
GFR, Estimated: 23 mL/min — ABNORMAL LOW (ref >60)
Glucose, Bld: 102 mg/dL — ABNORMAL HIGH (ref 70–99)
Potassium: 3.6 mmol/L (ref 3.5–5.1)
Sodium: 131 mmol/L — ABNORMAL LOW (ref 135–145)

## 2021-07-07 LAB — CBC
HCT: 33.1 % — ABNORMAL LOW (ref 36.0–46.0)
Hemoglobin: 10.8 g/dL — ABNORMAL LOW (ref 12.0–15.0)
MCH: 30.9 pg (ref 26.0–34.0)
MCHC: 32.6 g/dL (ref 30.0–36.0)
MCV: 94.8 fL (ref 80.0–100.0)
Platelets: 239 10*3/uL (ref 150–400)
RBC: 3.49 MIL/uL — ABNORMAL LOW (ref 3.87–5.11)
RDW: 14.2 % (ref 11.5–15.5)
WBC: 7.1 10*3/uL (ref 4.0–10.5)
nRBC: 0 % (ref 0.0–0.2)

## 2021-07-07 MED ORDER — DIPHENHYDRAMINE HCL 50 MG/ML IJ SOLN
12.5000 mg | Freq: Once | INTRAMUSCULAR | Status: DC
  Filled 2021-07-07: qty 1

## 2021-07-07 NOTE — Care Management Important Message (Signed)
Important Message  Patient Details  Name: ARABELA BASALDUA MRN: [id number] Date of Birth: 1941/03/12   Medicare Important Message Given:  Yes     Juliann Pulse A [place] Huberty 07/07/2021, 11:53 AM

## 2021-07-07 NOTE — Progress Notes (Signed)
PROGRESS NOTE    Mary Kennedy  FGH:[id number] DOB: [birth year] DOA: 07/03/2021 PCP: Virginia Crews, MD    Assessment & Plan:   Principal Problem:   Cellulitis Active Problems:   Adult hypothyroidism   Obesity, Class III, BMI 40-49.9 (morbid obesity) (HCC)   Atrial fibrillation (HCC)   Chronic combined systolic and diastolic heart failure (HCC)   CKD (chronic kidney disease) stage 4, GFR 15-29 ml/min (HCC)   Aortic stenosis   Drug rash  Unlikely cellulitis of b/l LE: w/ blisters as per ID. Hx of multiple drug allergies. D/c ancef  & monitor rash as per ID.   Drug rash: on abd, arms. D/c bactrim & ancef. Rash is unchanged from day prior. Hydroxyzine, benadryl prn for itching.     Chronic combined CHF: continue on metoprolol, losartan, aldactone & lasix. Monitor I/Os   Morbid Obesity: BMI 49.0. Complicates overall care & prognosis    Aortic stenosis: mod-severe. Last seen 2/8 with discussion of referral for TAVR. Will f/u w/ cardio outpatient    CKDIV: Cr is trending down slightly today. Avoid nephrotoxic meds   Hyponatremia: stable from day prior     PAF: continue on eliquis, amiodarone, & metoprolol    Hypothyroidism: continue on home dose of levothyroxine    DVT prophylaxis: eliquis  Code Status: full  Family Communication:  Disposition Plan: likely d/c back home w/ HH  Level of care: Med-Surg  Status is: Inpatient Remains inpatient appropriate because: d/c abxs and monitor rash. Can likely d/c home tomorrow    Consultants:  ID  Procedures:   Antimicrobials:    Subjective: Pt still c/o itching all over  Objective: Vitals:   07/06/21 1125 07/06/21 1605 07/06/21 2020 07/07/21 0356  BP: (!) 136/42 (!) 151/49 (!) 106/43 (!) 130/51  Pulse: (!) 50 (!) 55 (!) 51 (!) 55  Resp: 18 18 16 18   Temp: (!) 97 F (36.1 C) 97.8 F (36.6 C) 97.7 F (36.5 C) 97.7 F (36.5 C)  TempSrc:      SpO2: 100% 90% 98% 98%  Weight:      Height:         Intake/Output Summary (Last 24 hours) at 07/07/2021 0547 Last data filed at 07/06/2021 2200 Gross per 24 hour  Intake 480 ml  Output 1301 ml  Net -821 ml   Filed Weights   07/03/21 1503  Weight: 125.6 kg    Examination:  General exam: Appears comfortable. Morbid obesity  Respiratory system: decreased breath sounds b/l otherwise clear  Cardiovascular system: S1 & S2+. No rubs or gallops  Gastrointestinal system: Abd is soft, NT, obese & hypoactive bowel sounds  Central nervous system: alert and oriented. Moves all extremities Extremities:b/l LE are dressed and dressing is C/D/I Psychiatry: judgement and insight appear normal. Appropriate mood and affect     Data Reviewed: I have personally reviewed following labs and imaging studies  CBC: Recent Labs  Lab 07/01/21 1400 07/03/21 1511 07/06/21 0259  WBC 6.3 6.8 7.5  NEUTROABS 3.3 4.6  --   HGB 11.7* 11.4* 11.1*  HCT 37.4 36.3 34.6*  MCV 98.9 97.3 96.4  PLT 263 245 678   Basic Metabolic Panel: Recent Labs  Lab 07/01/21 1400 07/03/21 1511 07/06/21 0259  NA 135 130* 131*  K 4.6 4.9 4.2  CL 97* 96* 96*  CO2 26 24 26   GLUCOSE 107* 115* 104*  BUN 40* 37* 37*  CREATININE 2.35* 2.38* 2.19*  CALCIUM 9.7 9.1 8.7*   GFR: Estimated  Creatinine Clearance: 26.9 mL/min (A) (by C-G formula based on SCr of 2.19 mg/dL (H)). Liver Function Tests: Recent Labs  Lab 07/01/21 1400  AST 31  ALT 27  ALKPHOS 67  BILITOT 0.7  PROT 6.9  ALBUMIN 4.1   No results for input(s): LIPASE, AMYLASE in the last 168 hours. No results for input(s): AMMONIA in the last 168 hours. Coagulation Profile: No results for input(s): INR, PROTIME in the last 168 hours. Cardiac Enzymes: No results for input(s): CKTOTAL, CKMB, CKMBINDEX, TROPONINI in the last 168 hours. BNP (last 3 results) No results for input(s): PROBNP in the last 8760 hours. HbA1C: No results for input(s): HGBA1C in the last 72 hours. CBG: No results for input(s):  GLUCAP in the last 168 hours. Lipid Profile: No results for input(s): CHOL, HDL, LDLCALC, TRIG, CHOLHDL, LDLDIRECT in the last 72 hours. Thyroid Function Tests: No results for input(s): TSH, T4TOTAL, FREET4, T3FREE, THYROIDAB in the last 72 hours. Anemia Panel: No results for input(s): VITAMINB12, FOLATE, FERRITIN, TIBC, IRON, RETICCTPCT in the last 72 hours. Sepsis Labs: Recent Labs  Lab 07/01/21 1400 07/03/21 1511  LATICACIDVEN 1.9 1.4    Recent Results (from the past 240 hour(s))  Resp Panel by RT-PCR (Flu A&B, Covid) Nasopharyngeal Swab     Status: None   Collection Time: 07/03/21 10:17 PM   Specimen: Nasopharyngeal Swab; Nasopharyngeal(NP) swabs in vial transport medium  Result Value Ref Range Status   SARS Coronavirus 2 by RT PCR NEGATIVE NEGATIVE Final    Comment: (NOTE) SARS-CoV-2 target nucleic acids are NOT DETECTED.  The SARS-CoV-2 RNA is generally detectable in upper respiratory specimens during the acute phase of infection. The lowest concentration of SARS-CoV-2 viral copies this assay can detect is 138 copies/mL. A negative result does not preclude SARS-Cov-2 infection and should not be used as the sole basis for treatment or other patient management decisions. A negative result may occur with  improper specimen collection/handling, submission of specimen other than nasopharyngeal swab, presence of viral mutation(s) within the areas targeted by this assay, and inadequate number of viral copies(<138 copies/mL). A negative result must be combined with clinical observations, patient history, and epidemiological information. The expected result is Negative.  Fact Sheet for Patients:  EntrepreneurPulse.com.au  Fact Sheet for Healthcare Providers:  IncredibleEmployment.be  This test is no t yet approved or cleared by the [place] FDA and  has been authorized for detection and/or diagnosis of SARS-CoV-2 by FDA under an  Emergency Use Authorization (EUA). This EUA will remain  in effect (meaning this test can be used) for the duration of the COVID-19 declaration under Section 564(b)(1) of the Act, 21 U.S.C.section 360bbb-3(b)(1), unless the authorization is terminated  or revoked sooner.       Influenza A by PCR NEGATIVE NEGATIVE Final   Influenza B by PCR NEGATIVE NEGATIVE Final    Comment: (NOTE) The Xpert Xpress SARS-CoV-2/FLU/RSV plus assay is intended as an aid in the diagnosis of influenza from Nasopharyngeal swab specimens and should not be used as a sole basis for treatment. Nasal washings and aspirates are unacceptable for Xpert Xpress SARS-CoV-2/FLU/RSV testing.  Fact Sheet for Patients: EntrepreneurPulse.com.au  Fact Sheet for Healthcare Providers: IncredibleEmployment.be  This test is not yet approved or cleared by the [place] FDA and has been authorized for detection and/or diagnosis of SARS-CoV-2 by FDA under an Emergency Use Authorization (EUA). This EUA will remain in effect (meaning this test can be used) for the duration of the COVID-19 declaration under Section  564(b)(1) of the Act, 21 U.S.C. section 360bbb-3(b)(1), unless the authorization is terminated or revoked.  Performed at [facility], [address] [place], [place] [address]          Radiology Studies: No results found.      Scheduled Meds:  amiodarone  200 mg Oral Q1500   apixaban  5 mg Oral BID   calamine  1 application Topical BID   diphenhydrAMINE  12.5 mg Intravenous Once   furosemide  40 mg Intravenous BID   hydrocortisone cream   Topical TID   hydrOXYzine  25 mg Oral TID   levothyroxine  125 mcg Oral Q0600   losartan  100 mg Oral Daily   metoprolol succinate  25 mg Oral Daily   spironolactone  25 mg Oral Daily   zinc sulfate  220 mg Oral Daily   Continuous Infusions:     LOS: 4 days    Time spent: 15 mins     Wyvonnia Dusky, MD [facility] Pager [phone]  If 7PM-7AM, please contact night-coverage 07/07/2021, 5:47 AM

## 2021-07-07 NOTE — Plan of Care (Signed)

## 2021-07-07 NOTE — Progress Notes (Signed)
ID Patient doing much better No itching like before Eczematous rash coming down  O/e Awake and alert Looks better Patient Vitals for the past 24 hrs:  BP Temp Pulse Resp SpO2  07/07/21 0814 (!) 140/37 (!) 97.4 F (36.3 C) (!) 50 16 100 %  07/07/21 0356 (!) 130/51 97.7 F (36.5 C) (!) 55 18 98 %  07/06/21 2020 (!) 106/43 97.7 F (36.5 C) (!) 51 16 98 %  07/06/21 1605 (!) 151/49 97.8 F (36.6 C) (!) 55 18 90 %    Chest b/l air entry Hss1s2 Abd soft CNS non focal Skin- erythematous macular rash over chest , arms, thighs-fading  07/07/21 Bilateral leg erythema and blisters much improved with compression.  No evidence of infection     07/06/21       07/04/21   B/l lower extremity erythema and blistering    Labs CBC Latest Ref Rng & Units 07/07/2021 07/06/2021 07/03/2021  WBC 4.0 - 10.5 K/uL 7.1 7.5 6.8  Hemoglobin 12.0 - 15.0 g/dL 10.8(L) 11.1(L) 11.4(L)  Hematocrit 36.0 - 46.0 % 33.1(L) 34.6(L) 36.3  Platelets 150 - 400 K/uL 239 237 245    CMP Latest Ref Rng & Units 07/07/2021 07/06/2021 07/03/2021  Glucose 70 - 99 mg/dL 102(H) 104(H) 115(H)  BUN 8 - 23 mg/dL 41(H) 37(H) 37(H)  Creatinine 0.44 - 1.00 mg/dL 2.15(H) 2.19(H) 2.38(H)  Sodium 135 - 145 mmol/L 131(L) 131(L) 130(L)  Potassium 3.5 - 5.1 mmol/L 3.6 4.2 4.9  Chloride 98 - 111 mmol/L 97(L) 96(L) 96(L)  CO2 22 - 32 mmol/L 25 26 24   Calcium 8.9 - 10.3 mg/dL 8.8(L) 8.7(L) 9.1  Total Protein 6.5 - 8.1 g/dL - - -  Total Bilirubin 0.3 - 1.2 mg/dL - - -  Alkaline Phos 38 - 126 U/L - - -  AST 15 - 41 U/L - - -  ALT 0 - 44 U/L - - -     Impression/recommendation  B/l leg erythema and edema and blisters Very likely due to the underlying edema. No evidence of cellulitis as there was no response to antibiotic Drug allergy may have aggravated it B/l cellulitis is uncommon so I would think this goes in favor of edema and drug allergy She had received bactrim as OP and developed a drug rash but no change in  erythema She got 2 days of clinda with no difference She then was started on cefazolin on 2/21 but itching and rash worse and hence was stopped on 07/06/2021. So concern for cefazolin allergy Antibiotics were stopped on 07/06/2021 and she is doing much better without that. No further antibiotic is needed. Wound consultant seen patient and appreciate the recommendation..  Drug rash- bactrim and now could be cefazolin Calamine lotion topically  Heart failure on presentation- on lasix, spirinolactone, toprol, losartan  Obesity  Moderate mitral and aortic stenosis -   CKD  Afib paroxysmal- on amiodarone, metoprolol and apixiban Hypothyroidism on synthroid  Discussed the management with the patient and her nurse. ID will sign off.  Call if needed

## 2021-07-07 NOTE — TOC Progression Note (Signed)
Transition of Care [facility]) - Progression Note    Patient Details  Name: Mary Kennedy MRN: [id number] Date of Birth: 1941-04-05  Transition of Care [facility]) CM/SW [place], RN Phone Number: 07/07/2021, 3:10 PM  Clinical Narrative:    The patient was accepted by [facility] for Home health Nursing wound care  and PT   Expected Discharge Plan: Home/Self Care Barriers to Discharge: Continued Medical Work up  Expected Discharge Plan and Services Expected Discharge Plan: Home/Self Care                                               Social Determinants of Health (SDOH) Interventions    Readmission Risk Interventions No flowsheet data found.

## 2021-07-07 NOTE — Evaluation (Signed)
Physical Therapy Evaluation Patient Details Name: Mary Kennedy MRN: [id number] DOB: 03-18-1941 Today's Date: 07/07/2021  History of Present Illness  Pt is a 81 y.o. female presenting to hospital 2/20 for evaluation of cellulitis.  Pt admitted with  B LE cellulitis.  Pt developed an allergic reaction causing a wide spread rash.  PMH includes HLD, hypothyroidism, a-fib, CKD stage III, B CTR, B foot sx.  Clinical Impression  Prior to hospital admission, pt was independent with functional mobility; lives alone in 1 level home with 5 STE with railing.  Currently pt is modified independent with bed mobility and transfers, and CGA progressing to SBA ambulating 260 feet (no AD) use.  No loss of balance noted during ambulation but pt did appear SOB end of ambulation (SOB resolved within a few minutes of sitting rest); O2 sats 94% or greater on room air and HR 54-69 bpm during sessions activities.  Pt would benefit from skilled PT to address noted impairments and functional limitations (see below for any additional details).  Upon hospital discharge, no further PT needs anticipated.    Recommendations for follow up therapy are one component of a multi-disciplinary discharge planning process, led by the attending physician.  Recommendations may be updated based on patient status, additional functional criteria and insurance authorization.  Follow Up Recommendations No PT follow up    Assistance Recommended at Discharge None  Patient can return home with the following  Help with stairs or ramp for entrance    Equipment Recommendations None recommended by PT  Recommendations for Other Services       Functional Status Assessment Patient has had a recent decline in their functional status and demonstrates the ability to make significant improvements in function in a reasonable and predictable amount of time.     Precautions / Restrictions Precautions Precautions: None Restrictions Weight Bearing  Restrictions: No      Mobility  Bed Mobility Overal bed mobility: Modified Independent             General bed mobility comments: HOB elevated; mild increased effort to perform d/t B LE pain with movement    Transfers Overall transfer level: Modified independent Equipment used: None               General transfer comment: pt with single UE support on bed rail standing with wider BOS    Ambulation/Gait Ambulation/Gait assistance: Min guard, Supervision Gait Distance (Feet): 260 Feet Assistive device: None Gait Pattern/deviations: Step-through pattern Gait velocity: mildly decreased     General Gait Details: wider BOS with mild increased B lateral sway (pt overall steady though)  Stairs            Wheelchair Mobility    Modified Rankin (Stroke Patients Only)       Balance Overall balance assessment: Needs assistance Sitting-balance support: No upper extremity supported, Feet supported Sitting balance-Leahy Scale: Normal Sitting balance - Comments: steady sitting reaching outside BOS   Standing balance support: No upper extremity supported, During functional activity Standing balance-Leahy Scale: Good Standing balance comment: no loss of balance noted with ambulation                             Pertinent Vitals/Pain Pain Assessment Pain Assessment: 0-10 Pain Score: 2  Pain Location: B LE pain Pain Descriptors / Indicators: Sore Pain Intervention(s): Limited activity within patient's tolerance, Monitored during session, Repositioned    Home Living Family/patient expects to be  discharged to:: Private residence Living Arrangements: Alone   Type of Home: House Home Access: Stairs to enter Entrance Stairs-Rails: Right Entrance Stairs-Number of Steps: 5   Home Layout: One level Home Equipment: None      Prior Function Prior Level of Function : Independent/Modified Independent             Mobility Comments: Pt reports no  recent falls.  Pt also reports increased effort required to navigate stairs (at baseline) d/t B knee issues causing pain (pt reports knees "bone on bone").       Hand Dominance        Extremity/Trunk Assessment   Upper Extremity Assessment Upper Extremity Assessment: Overall WFL for tasks assessed    Lower Extremity Assessment Lower Extremity Assessment: Generalized weakness    Cervical / Trunk Assessment Cervical / Trunk Assessment: Other exceptions Cervical / Trunk Exceptions: forward head/shoulders  Communication   Communication: No difficulties  Cognition Arousal/Alertness: Awake/alert Behavior During Therapy: WFL for tasks assessed/performed Overall Cognitive Status: Within Functional Limits for tasks assessed                                          General Comments  Nursing cleared pt for participation in physical therapy.  Pt agreeable to PT session.    Exercises  Encouraged pt to ambulate with staff to improve strength and endurance: pt verbalizing understanding.   Assessment/Plan    PT Assessment Patient needs continued PT services  PT Problem List Decreased strength;Decreased activity tolerance;Decreased mobility       PT Treatment Interventions DME instruction;Gait training;Stair training;Functional mobility training;Therapeutic activities;Therapeutic exercise;Balance training;Patient/family education    PT Goals (Current goals can be found in the Care Plan section)  Acute Rehab PT Goals Patient Stated Goal: to improve strength and mobility PT Goal Formulation: With patient Time For Goal Achievement: 07/21/21 Potential to Achieve Goals: Good    Frequency Min 2X/week     Co-evaluation               AM-PAC PT "6 Clicks" Mobility  Outcome Measure Help needed turning from your back to your side while in a flat bed without using bedrails?: None Help needed moving from lying on your back to sitting on the side of a flat bed  without using bedrails?: None Help needed moving to and from a bed to a chair (including a wheelchair)?: None Help needed standing up from a chair using your arms (e.g., wheelchair or bedside chair)?: None Help needed to walk in hospital room?: None Help needed climbing 3-5 steps with a railing? : A Little 6 Click Score: 23    End of Session Equipment Utilized During Treatment: Gait belt Activity Tolerance: Patient tolerated treatment well Patient left: in bed;with call bell/phone within reach Nurse Communication: Mobility status;Precautions PT Visit Diagnosis: Muscle weakness (generalized) (M62.81)    Time: 6160-7371 PT Time Calculation (min) (ACUTE ONLY): 29 min   Charges:   PT Evaluation $PT Eval Low Complexity: 1 Low PT Treatments $Therapeutic Exercise: 8-22 mins       Leitha Bleak, PT 07/07/21, 4:35 PM

## 2021-07-08 DIAGNOSIS — R600 Localized edema: Secondary | ICD-10-CM

## 2021-07-08 LAB — BASIC METABOLIC PANEL
Anion gap: 13 (ref 5–15)
BUN: 44 mg/dL — ABNORMAL HIGH (ref 8–23)
CO2: 23 mmol/L (ref 22–32)
Calcium: 9.2 mg/dL (ref 8.9–10.3)
Chloride: 92 mmol/L — ABNORMAL LOW (ref 98–111)
Creatinine, Ser: 2.34 mg/dL — ABNORMAL HIGH (ref 0.44–1.00)
GFR, Estimated: 21 mL/min — ABNORMAL LOW (ref >60)
Glucose, Bld: 108 mg/dL — ABNORMAL HIGH (ref 70–99)
Potassium: 4.3 mmol/L (ref 3.5–5.1)
Sodium: 128 mmol/L — ABNORMAL LOW (ref 135–145)

## 2021-07-08 LAB — CBC
HCT: 34.8 % — ABNORMAL LOW (ref 36.0–46.0)
Hemoglobin: 11.3 g/dL — ABNORMAL LOW (ref 12.0–15.0)
MCH: 30.9 pg (ref 26.0–34.0)
MCHC: 32.5 g/dL (ref 30.0–36.0)
MCV: 95.1 fL (ref 80.0–100.0)
Platelets: 284 10*3/uL (ref 150–400)
RBC: 3.66 MIL/uL — ABNORMAL LOW (ref 3.87–5.11)
RDW: 14 % (ref 11.5–15.5)
WBC: 8.7 10*3/uL (ref 4.0–10.5)
nRBC: 0 % (ref 0.0–0.2)

## 2021-07-08 NOTE — Discharge Summary (Signed)
Physician Discharge Summary  Mary Kennedy:[id number] DOB: [birth year] DOA: 07/03/2021  PCP: Virginia Crews, MD  Admit date: 07/03/2021 Discharge date: 07/08/2021  Admitted From: home  Disposition:  home w/ home health  Recommendations for Outpatient Follow-up:  Follow up with PCP in 1-2 weeks   Home Health: yes Equipment/Devices:  Discharge Condition: stable CODE STATUS: Full  Diet recommendation: Heart Healthy   Brief/Interim Summary: HPI was taken from Dr. Damita Dunnings: Mary Kennedy is a 81 y.o. female with medical history significant of Atrial fibrillation on Eliquis, moderate to severe aortic stenosis, CKD 4, HTN, hypothyroidism, class III obesity, COPD, chronic combined CHF with chronic lower extremity edema with chronic lower extremity wounds, currently on metolazone and torsemide to control the swelling, who presents to the ED for the second time in a few days with a complaint of persistent swelling of the legs and worsening weeping of her wounds.  During her first visit, she had a negative lower extremity Doppler and was discharged with oral clindamycin after an initial IV dose.  She was previously treated with Bactrim.  She has history of swelling reaction to cephalosporins and penicillins.  She denies fever or chills.   ED course: On arrival BP 91/60 with pulse 59 and otherwise normal vitals Blood work significant for sodium of 130 and creatinine 2.38 which is her baseline WBC 6.8 and lactic acid 1.4 COVID and flu negative   Patient started on IV clindamycin.  Hospitalist consulted for admission.  As per Dr. Jimmye Norman 2/22-2/25/23: Pt presented w/ what initially thought to be b/l LE cellulitis w/ blisters & likely drug rash. Pt was taking po bactrim prior to admission and then pt was started on IV ancef as per ID. The rash got worse w/ IV ancef use so it was d/c by ID. The b/l LE swelling w/ blisters was likely secondary to significant underlying edema, as pt has hx  of CHF. Pt did receive diuretics while inpatient & wound care of b/l LE. Pt was d/c home w/ home health for wound care & PT.    Discharge Diagnoses:  Principal Problem:   Cellulitis Active Problems:   Adult hypothyroidism   Obesity, Class III, BMI 40-49.9 (morbid obesity) (HCC)   Atrial fibrillation (HCC)   Chronic combined systolic and diastolic heart failure (HCC)   CKD (chronic kidney disease) stage 4, GFR 15-29 ml/min (HCC)   Aortic stenosis   Drug rash  Unlikely cellulitis of b/l LE: w/ blisters as per ID. Hx of multiple drug allergies. D/c ancef  & monitor rash as per ID.    Drug rash: on abd, arms. D/c bactrim & ancef. Rash is unchanged from day prior. Hydroxyzine, benadryl prn for itching.     Chronic combined CHF: continue on metoprolol, losartan, aldactone & lasix. Monitor I/Os   Morbid Obesity: BMI 49.0. Complicates overall care & prognosis    Aortic stenosis: mod-severe. Last seen 2/8 with discussion of referral for TAVR. Will f/u w/ cardio outpatient    CKDIV: Cr is trending down slightly today. Avoid nephrotoxic meds    Hyponatremia: stable from day prior     PAF: continue on eliquis, amiodarone, & metoprolol    Hypothyroidism: continue on home dose of levothyroxine  Discharge Instructions  Discharge Instructions     Diet - low sodium heart healthy   Complete by: As directed    Discharge instructions   Complete by: As directed    F/u w/ PCP in 1-2 weeks. F/u w/ cardio in  1-2 weeks.   Discharge wound care:   Complete by: As directed    apply Xeroform gauze to open weeping areas on bilat legs daily, then cover with ABD pads and kerlex and ace wraps   Increase activity slowly   Complete by: As directed       Allergies as of 07/08/2021       Reactions   Bactrim [sulfamethoxazole-trimethoprim] Rash   Rash with blisters on legs   Lisinopril Anaphylaxis, Swelling, Cough   Close airway   Other Swelling   Persimmon causes mouth and throat swelling    Tegaderm Ag Mesh 2"x2"  [wound Dressings]    when removing the patch her skin came off.   Wound Dressing Adhesive    Adhesive from covering for PICC line tore skin   Cefazolin In Sodium Chloride Rash   Ciprofloxacin Hives, Rash   Latex Rash   Adhesive from covering for PICC line tore skin   Penicillins Swelling, Rash        Medication List     STOP taking these medications    clindamycin 150 MG capsule Commonly known as: CLEOCIN   sulfamethoxazole-trimethoprim 800-160 MG tablet Commonly known as: BACTRIM DS       TAKE these medications    acetaminophen 650 MG CR tablet Commonly known as: TYLENOL Take 650 mg by mouth every 8 (eight) hours as needed for pain.   albuterol 108 (90 Base) MCG/ACT inhaler Commonly known as: VENTOLIN HFA Inhale 2 puffs into the lungs every 6 (six) hours as needed for wheezing or shortness of breath.   ALLERGY RELIEF PO Take by mouth.   amiodarone 400 MG tablet Commonly known as: PACERONE Take 200 mg by mouth daily in the afternoon.   apixaban 5 MG Tabs tablet Commonly known as: ELIQUIS Take 5 mg by mouth 2 (two) times daily.   atorvastatin 20 MG tablet Commonly known as: LIPITOR Take 1 tablet (20 mg total) by mouth daily. What changed: when to take this   BENADRYL PO Take by mouth.   BURN SPRAY EX Apply topically.   Calcium 600+D 600-20 MG-MCG Tabs Generic drug: Calcium Carb-Cholecalciferol Take 2 tablets by mouth every evening.   furosemide 20 MG tablet Commonly known as: LASIX Take 1 tablet (20 mg total) by mouth daily. What changed: how much to take   hydrochlorothiazide 12.5 MG tablet Commonly known as: HYDRODIURIL Take 1 tablet (12.5 mg total) by mouth daily.   levothyroxine 125 MCG tablet Commonly known as: SYNTHROID TAKE 1 TABLET BY MOUTH  DAILY BEFORE BREAKFAST   losartan 100 MG tablet Commonly known as: COZAAR TAKE 1 TABLET BY MOUTH  DAILY   metoprolol succinate 50 MG 24 hr tablet Commonly known as:  TOPROL-XL Take 0.5 tablets (25 mg total) by mouth daily.   metroNIDAZOLE 0.75 % gel Commonly known as: METROGEL Apply 1 application topically 2 (two) times daily.   MULTIPLE VITAMIN PO Take 1 tablet by mouth daily. Centrum 50 +   Ocuvite Adult 50+ Caps Take 1 capsule by mouth daily.   OVER THE COUNTER MEDICATION Take 1 Scoop by mouth daily. Super Beets in glass of water 8 oz   potassium chloride SA 20 MEQ tablet Commonly known as: KLOR-CON M TAKE 1 TABLET BY MOUTH  DAILY WITH LASIX   spironolactone 25 MG tablet Commonly known as: ALDACTONE Take 25 mg by mouth daily.   triamcinolone cream 0.1 % Commonly known as: KENALOG APPLY TOPICALLY TWO TIMES  DAILY   Vitamin D3  50 MCG (2000 UT) capsule Take 2,000 Units by mouth daily.   Zinc 50 MG Caps Take 50 mg by mouth daily.               Discharge Care Instructions  (From admission, onward)           Start     Ordered   07/08/21 0000  Discharge wound care:       Comments: apply Xeroform gauze to open weeping areas on bilat legs daily, then cover with ABD pads and kerlex and ace wraps   07/08/21 1031            Allergies  Allergen Reactions   Bactrim [Sulfamethoxazole-Trimethoprim] Rash    Rash with blisters on legs   Lisinopril Anaphylaxis, Swelling and Cough    Close airway   Other Swelling    Persimmon causes mouth and throat swelling   Tegaderm Ag Mesh 2"X2"  [Wound Dressings]     when removing the patch her skin came off.   Wound Dressing Adhesive     Adhesive from covering for PICC line tore skin   Cefazolin In Sodium Chloride Rash   Ciprofloxacin Hives and Rash   Latex Rash    Adhesive from covering for PICC line tore skin   Penicillins Swelling and Rash    Consultations: ID   Procedures/Studies: US Venous Img Lower Bilateral  Result Date: 07/01/2021 CLINICAL DATA:  Leg swelling and redness. EXAM: BILATERAL LOWER EXTREMITY VENOUS DOPPLER ULTRASOUND TECHNIQUE: Gray-scale sonography  with compression, as well as color and duplex ultrasound, were performed to evaluate the deep venous system(s) from the level of the common femoral vein through the popliteal and proximal calf veins. COMPARISON:  None. FINDINGS: VENOUS Normal compressibility of the common femoral, superficial femoral, and popliteal veins, as well as the visualized calf veins. Visualized portions of profunda femoral vein and great saphenous vein unremarkable. No filling defects to suggest DVT on grayscale or color Doppler imaging. Doppler waveforms show normal direction of venous flow, normal respiratory plasticity and response to augmentation. OTHER Right calf soft tissue edema. Limitations: Patient body habitus IMPRESSION: No evidence of bilateral lower extremity DVT. Electronically Signed   By: Keith Rake M.D.   On: 07/01/2021 18:36   (Echo, Carotid, EGD, Colonoscopy, ERCP)    Subjective: Pt c/o intermittent itching of her rash    Discharge Exam: Vitals:   07/08/21 0654 07/08/21 0818  BP: (!) 140/42 (!) 152/40  Pulse: (!) 54 (!) 57  Resp: 16 18  Temp: 97.8 F (36.6 C) (!) 97.5 F (36.4 C)  SpO2: 95% 98%   Vitals:   07/07/21 1546 07/07/21 2316 07/08/21 0654 07/08/21 0818  BP: 106/65 (!) 126/31 (!) 140/42 (!) 152/40  Pulse: (!) 51 (!) 54 (!) 54 (!) 57  Resp: 16 18 16 18   Temp: 97.8 F (36.6 C) 98.3 F (36.8 C) 97.8 F (36.6 C) (!) 97.5 F (36.4 C)  TempSrc: Oral     SpO2: 98% 96% 95% 98%  Weight:      Height:        General: Pt is alert, awake, not in acute distress Cardiovascular:  S1/S2 +, no rubs, no gallops Respiratory: CTA bilaterally, no wheezing, no rhonchi Abdominal: Soft, NT, obese, bowel sounds + Extremities: b/l LE edema, no cyanosis    The results of significant diagnostics from this hospitalization (including imaging, microbiology, ancillary and laboratory) are listed below for reference.     Microbiology: Recent Results (from the past 240  hour(s))  Resp Panel by  RT-PCR (Flu A&B, Covid) Nasopharyngeal Swab     Status: None   Collection Time: 07/03/21 10:17 PM   Specimen: Nasopharyngeal Swab; Nasopharyngeal(NP) swabs in vial transport medium  Result Value Ref Range Status   SARS Coronavirus 2 by RT PCR NEGATIVE NEGATIVE Final    Comment: (NOTE) SARS-CoV-2 target nucleic acids are NOT DETECTED.  The SARS-CoV-2 RNA is generally detectable in upper respiratory specimens during the acute phase of infection. The lowest concentration of SARS-CoV-2 viral copies this assay can detect is 138 copies/mL. A negative result does not preclude SARS-Cov-2 infection and should not be used as the sole basis for treatment or other patient management decisions. A negative result may occur with  improper specimen collection/handling, submission of specimen other than nasopharyngeal swab, presence of viral mutation(s) within the areas targeted by this assay, and inadequate number of viral copies(<138 copies/mL). A negative result must be combined with clinical observations, patient history, and epidemiological information. The expected result is Negative.  Fact Sheet for Patients:  EntrepreneurPulse.com.au  Fact Sheet for Healthcare Providers:  IncredibleEmployment.be  This test is no t yet approved or cleared by the [place] FDA and  has been authorized for detection and/or diagnosis of SARS-CoV-2 by FDA under an Emergency Use Authorization (EUA). This EUA will remain  in effect (meaning this test can be used) for the duration of the COVID-19 declaration under Section 564(b)(1) of the Act, 21 U.S.C.section 360bbb-3(b)(1), unless the authorization is terminated  or revoked sooner.       Influenza A by PCR NEGATIVE NEGATIVE Final   Influenza B by PCR NEGATIVE NEGATIVE Final    Comment: (NOTE) The Xpert Xpress SARS-CoV-2/FLU/RSV plus assay is intended as an aid in the diagnosis of influenza from Nasopharyngeal swab  specimens and should not be used as a sole basis for treatment. Nasal washings and aspirates are unacceptable for Xpert Xpress SARS-CoV-2/FLU/RSV testing.  Fact Sheet for Patients: EntrepreneurPulse.com.au  Fact Sheet for Healthcare Providers: IncredibleEmployment.be  This test is not yet approved or cleared by the [place] FDA and has been authorized for detection and/or diagnosis of SARS-CoV-2 by FDA under an Emergency Use Authorization (EUA). This EUA will remain in effect (meaning this test can be used) for the duration of the COVID-19 declaration under Section 564(b)(1) of the Act, 21 U.S.C. section 360bbb-3(b)(1), unless the authorization is terminated or revoked.  Performed at [facility], [address] [place], [place] [address]      Labs: BNP (last 3 results) Recent Labs    03/16/21 1043  BNP 831.5*   Basic Metabolic Panel: Recent Labs  Lab 07/01/21 1400 07/03/21 1511 07/06/21 0259 07/07/21 0624 07/08/21 0548  NA 135 130* 131* 131* 128*  K 4.6 4.9 4.2 3.6 4.3  CL 97* 96* 96* 97* 92*  CO2 26 24 26 25 23   GLUCOSE 107* 115* 104* 102* 108*  BUN 40* 37* 37* 41* 44*  CREATININE 2.35* 2.38* 2.19* 2.15* 2.34*  CALCIUM 9.7 9.1 8.7* 8.8* 9.2   Liver Function Tests: Recent Labs  Lab 07/01/21 1400  AST 31  ALT 27  ALKPHOS 67  BILITOT 0.7  PROT 6.9  ALBUMIN 4.1   No results for input(s): LIPASE, AMYLASE in the last 168 hours. No results for input(s): AMMONIA in the last 168 hours. CBC: Recent Labs  Lab 07/01/21 1400 07/03/21 1511 07/06/21 0259 07/07/21 0624 07/08/21 0548  WBC 6.3 6.8 7.5 7.1 8.7  NEUTROABS 3.3 4.6  --   --   --  HGB 11.7* 11.4* 11.1* 10.8* 11.3*  HCT 37.4 36.3 34.6* 33.1* 34.8*  MCV 98.9 97.3 96.4 94.8 95.1  PLT 263 245 237 239 284   Cardiac Enzymes: No results for input(s): CKTOTAL, CKMB, CKMBINDEX, TROPONINI in the last 168 hours. BNP: Invalid input(s): POCBNP CBG: No  results for input(s): GLUCAP in the last 168 hours. D-Dimer No results for input(s): DDIMER in the last 72 hours. Hgb A1c No results for input(s): HGBA1C in the last 72 hours. Lipid Profile No results for input(s): CHOL, HDL, LDLCALC, TRIG, CHOLHDL, LDLDIRECT in the last 72 hours. Thyroid function studies No results for input(s): TSH, T4TOTAL, T3FREE, THYROIDAB in the last 72 hours.  Invalid input(s): FREET3 Anemia work up No results for input(s): VITAMINB12, FOLATE, FERRITIN, TIBC, IRON, RETICCTPCT in the last 72 hours. Urinalysis    Component Value Date/Time   COLORURINE YELLOW (A) 07/01/2021 1400   APPEARANCEUR HAZY (A) 07/01/2021 1400   APPEARANCEUR Clear 06/04/2014 1037   LABSPEC 1.018 07/01/2021 1400   LABSPEC 1.010 06/04/2014 1037   PHURINE 5.0 07/01/2021 1400   GLUCOSEU NEGATIVE 07/01/2021 1400   GLUCOSEU Negative 06/04/2014 1037   HGBUR SMALL (A) 07/01/2021 1400   BILIRUBINUR NEGATIVE 07/01/2021 1400   BILIRUBINUR Negative 06/12/2019 0746   BILIRUBINUR Negative 06/04/2014 1037   KETONESUR NEGATIVE 07/01/2021 1400   PROTEINUR NEGATIVE 07/01/2021 1400   UROBILINOGEN 0.2 06/12/2019 0746   NITRITE NEGATIVE 07/01/2021 1400   LEUKOCYTESUR SMALL (A) 07/01/2021 1400   LEUKOCYTESUR Negative 06/04/2014 1037   Sepsis Labs Invalid input(s): PROCALCITONIN,  WBC,  LACTICIDVEN Microbiology Recent Results (from the past 240 hour(s))  Resp Panel by RT-PCR (Flu A&B, Covid) Nasopharyngeal Swab     Status: None   Collection Time: 07/03/21 10:17 PM   Specimen: Nasopharyngeal Swab; Nasopharyngeal(NP) swabs in vial transport medium  Result Value Ref Range Status   SARS Coronavirus 2 by RT PCR NEGATIVE NEGATIVE Final    Comment: (NOTE) SARS-CoV-2 target nucleic acids are NOT DETECTED.  The SARS-CoV-2 RNA is generally detectable in upper respiratory specimens during the acute phase of infection. The lowest concentration of SARS-CoV-2 viral copies this assay can detect is 138  copies/mL. A negative result does not preclude SARS-Cov-2 infection and should not be used as the sole basis for treatment or other patient management decisions. A negative result may occur with  improper specimen collection/handling, submission of specimen other than nasopharyngeal swab, presence of viral mutation(s) within the areas targeted by this assay, and inadequate number of viral copies(<138 copies/mL). A negative result must be combined with clinical observations, patient history, and epidemiological information. The expected result is Negative.  Fact Sheet for Patients:  EntrepreneurPulse.com.au  Fact Sheet for Healthcare Providers:  IncredibleEmployment.be  This test is no t yet approved or cleared by the [place] FDA and  has been authorized for detection and/or diagnosis of SARS-CoV-2 by FDA under an Emergency Use Authorization (EUA). This EUA will remain  in effect (meaning this test can be used) for the duration of the COVID-19 declaration under Section 564(b)(1) of the Act, 21 U.S.C.section 360bbb-3(b)(1), unless the authorization is terminated  or revoked sooner.       Influenza A by PCR NEGATIVE NEGATIVE Final   Influenza B by PCR NEGATIVE NEGATIVE Final    Comment: (NOTE) The Xpert Xpress SARS-CoV-2/FLU/RSV plus assay is intended as an aid in the diagnosis of influenza from Nasopharyngeal swab specimens and should not be used as a sole basis for treatment. Nasal washings and aspirates are unacceptable  for Xpert Xpress SARS-CoV-2/FLU/RSV testing.  Fact Sheet for Patients: EntrepreneurPulse.com.au  Fact Sheet for Healthcare Providers: IncredibleEmployment.be  This test is not yet approved or cleared by the [place] FDA and has been authorized for detection and/or diagnosis of SARS-CoV-2 by FDA under an Emergency Use Authorization (EUA). This EUA will remain in effect (meaning  this test can be used) for the duration of the COVID-19 declaration under Section 564(b)(1) of the Act, 21 U.S.C. section 360bbb-3(b)(1), unless the authorization is terminated or revoked.  Performed at [facility], [address] [place], [place] [address]      Time coordinating discharge: Over 30 minutes  SIGNED:   Wyvonnia Dusky, MD  [facility] 07/08/2021, 10:32 AM Pager   If 7PM-7AM, please contact night-coverage

## 2021-07-08 NOTE — Plan of Care (Signed)
Patient discharged per MD orders at this time.All discharge instructions,education and medications reviewed with the patient.Pt expressed understanding and will comply with dc instructions.follow up appointments was also communicated to the patient.no verbal c/o or any ssx of distress.Pt was discharged home with HH/nursing wound care per order. Patient transported self home in her privately owned vehicle.

## 2021-07-08 NOTE — TOC Transition Note (Addendum)
Transition of Care [facility] - CM/SW Discharge Note   Patient Details  Name: Mary Kennedy MRN: [id number] Date of Birth: 1940/09/19  Transition of Care [facility]) CM/SW Contact:  Magnus Ivan, LCSW Phone Number: 07/08/2021, 10:34 AM   Clinical Narrative:   Clabe Seal with [place] that patient has orders to discharge home today.  Per Corene Cornea, start of care is 3/2.    Final next level of care: [place] Barriers to Discharge: Barriers Resolved   Patient Goals and CMS Choice        Discharge Placement                       Discharge Plan and Services                          HH Arranged: RN, PT [facility] Agency: [place] ([facility]) Date [facility] Agency Contacted: 07/08/21   Representative spoke with at [place]: [place] ([place]) Interventions     Readmission Risk Interventions No flowsheet data found.

## 2021-07-10 ENCOUNTER — Telehealth: Payer: Self-pay

## 2021-07-10 ENCOUNTER — Telehealth: Payer: Self-pay | Admitting: Family Medicine

## 2021-07-10 ENCOUNTER — Telehealth: Payer: Self-pay | Admitting: *Deleted

## 2021-07-10 DIAGNOSIS — L03119 Cellulitis of unspecified part of limb: Secondary | ICD-10-CM

## 2021-07-10 DIAGNOSIS — L98499 Non-pressure chronic ulcer of skin of other sites with unspecified severity: Secondary | ICD-10-CM

## 2021-07-10 NOTE — Telephone Encounter (Signed)
Home Health Verbal Orders - Caller/Agency:patient Mary Kennedy Callback TRVUYE:[phone] RequestingSkilled Nursing/ wound care Frequency: ?

## 2021-07-10 NOTE — Telephone Encounter (Addendum)
Transition Care Management Unsuccessful Follow-up Telephone Call  Date of discharge and from where:  TCM DC [facility] 07-08-21 Dx: cellulitis  Attempts:  1st Attempt  Reason for unsuccessful TCM follow-up call:  Left voice message  Transition Care Management Follow-up Telephone Call Date of discharge and from where: TCM DC [facility] 07-08-21 Dx: cellulitis How have you been since you were released from the hospital? Doing better  Any questions or concerns? No  Items Reviewed: Did the pt receive and understand the discharge instructions provided? Yes  Medications obtained and verified? Yes  Other? No  Any new allergies since your discharge? No  Dietary orders reviewed? Yes Do you have support at home? Yes   Home Care and Equipment/Supplies: Were home health services ordered? Yes- RN If so, what is the name of the agency? [place]   Has the agency set up a time to come to the patient's home? yes Were any new equipment or medical supplies ordered?  No What is the name of the medical supply agency? [facility]  Were you able to get the supplies/equipment? not applicable Do you have any questions related to the use of the equipment or supplies? No  Functional Questionnaire: (I = Independent and D = Dependent) ADLs: I  Bathing/Dressing- I  Meal Prep- I  Eating- I  Maintaining continence- I  Transferring/Ambulation- I  Managing Meds- I  Follow up appointments reviewed:  PCP Hospital f/u appt confirmed? Yes  Scheduled to see Dr Cheryll Cockayne on 07-11-21 @ 240pm. [facility] f/u appt confirmed? No . Are transportation arrangements needed? No  If their condition worsens, is the pt aware to call PCP or go to the Emergency Dept.? Yes Was the patient provided with contact information for the PCP's office or ED? Yes Was to pt encouraged to call back with questions or concerns? Yes

## 2021-07-10 NOTE — Chronic Care Management (AMB) (Signed)
Chronic Care Management   Note  07/10/2021 Name: Mary Kennedy MRN: [id number] DOB: 1940-09-22  Mary Kennedy is a 81 y.o. year old female who is a primary care patient of Bacigalupo, Dionne Bucy, MD. I reached out to [employer] by phone today in response to a referral sent by Mary Kennedy's PCP.  Mary Kennedy was given information about Chronic Care Management services today including:  CCM service includes personalized support from designated clinical staff supervised by her physician, including individualized plan of care and coordination with other care providers 24/7 contact phone numbers for assistance for urgent and routine care needs. Service will only be billed when office clinical staff spend 20 minutes or more in a month to coordinate care. Only one practitioner may furnish and bill the service in a calendar month. The patient may stop CCM services at any time (effective at the end of the month) by phone call to the office staff. The patient is responsible for co-pay (up to 20% after annual deductible is met) if co-pay is required by the individual health plan.   Patient agreed to services and verbal consent obtained.   Follow up plan: Telephone appointment with care management team member scheduled for: 07/14/2021  Julian Hy, [place] Management  Direct Dial: [phone]

## 2021-07-10 NOTE — Telephone Encounter (Signed)
-----   Message from Virginia Crews, MD sent at 07/10/2021  2:32 PM EST ----- Regarding: FW: Please call Please place the referral as requested for CCM ----- Message ----- From: Massie Maroon, CMA Sent: 07/10/2021  10:42 AM EST To: Virginia Crews, MD Subject: FW: Please call                                Your patient was recently discharged from the hospital and is eligible for CCM services. To refer, please order via: BPA (Best Practice Advisory) in your patient's EMR OR  [id number] referral order    Thank you,  Julian Hy, [place] Management  Direct Dial: [phone]   ----- Message ----- From: [place] [facility] Sent: 07/10/2021  10:26 AM EST To: Ccm Care Guide Subject: Please call                                    Hi  Ms. Harcum called asking about someone coming to change her dressings.  She stated that the hospital said there would be someone to come by to do that but she hasn't heard from anyone yet.   She discharged from [facility] on 07/08/21. She has Afib, and HF.    Her PCP is [facility], structural at [facility].

## 2021-07-11 ENCOUNTER — Ambulatory Visit (INDEPENDENT_AMBULATORY_CARE_PROVIDER_SITE_OTHER): Payer: Medicare Other | Admitting: Physician Assistant

## 2021-07-11 ENCOUNTER — Other Ambulatory Visit: Payer: Self-pay

## 2021-07-11 ENCOUNTER — Encounter: Payer: Self-pay | Admitting: Physician Assistant

## 2021-07-11 VITALS — BP 143/67 | HR 56 | Resp 18 | Wt 282.2 lb

## 2021-07-11 DIAGNOSIS — L98499 Non-pressure chronic ulcer of skin of other sites with unspecified severity: Secondary | ICD-10-CM | POA: Diagnosis not present

## 2021-07-11 DIAGNOSIS — L270 Generalized skin eruption due to drugs and medicaments taken internally: Secondary | ICD-10-CM

## 2021-07-11 NOTE — Progress Notes (Signed)
Established patient visit   [name]   DOB: 09/07/1940   81 y.o. Female  MRN: [id number] Visit Date: 07/11/2021  Today's healthcare provider: Mardene Speak, PA-C   Chief Complaint  Patient presents with   Hospitalization Follow-up   Subjective    HPI  Follow up Hospitalization   Mary Kennedy is a 81 y.o. female with medical history significant of Atrial fibrillation on Eliquis, moderate to severe aortic stenosis, CKD 4, HTN, hypothyroidism, class III obesity, COPD, chronic combined CHF with chronic lower extremity edema with chronic lower extremity wounds, currently on metolazone and torsemide to control the swelling.   Patient was admitted to persistent swelling of legs and weeping of her wounds on 07/03/21 and discharged on 07/08/21. She was treated for Edema. Treatment for this included: This is a second hospital visit within a few days. Pt was taking po bactrim prior to admission and then pt was started on IV ancef as per ID. The rash got worse w/ IV ancef use so it was d/c by ID. The b/l LE swelling w/ blisters was likely secondary to significant underlying edema, as pt has hx of CHF. Pt did receive diuretics while inpatient & wound care of b/l LE. Pt was d/c home w/ home health for wound care & PT. . Telephone follow up was done on 07/10/21 was unsuccessful  She reports poor compliance with treatment.Patient states that she was placed on various antibiotics within a 4 day period and states that she developed a rash all over her body. Patient states that all antibiotics were d/c by ID. She reports this condition is improved. Patient reports today chronic shortness of breath and states that her hands and feet have remained itchy and hot, she has been applying otc Hydrocortisone for relief.   Time since discharge: 3 days Hospital/facility: [facility] ED Diagnosis: Unlikely cellulitis of b/l LE w/blisters as per ID. She did not improve  on abx treatment and developed rash.  During her first visit to ED, she had a negative lower extremity Doppler and was discharged with oral clindamycin after an initial IV dose.  She has history of swelling reaction to cephalosporins and penicillins.  New medications: Benadryl and topical steroid Discharge instructions:  low sodium diet, f/u w/cardio in 1-2 weeks. Wound care: xeroform gauze to oen weeping areas on b/l legs daily, then cover with BD pads and kerlex and ace wraps. Recommended to increase activity slowly. Status: better  ----------------------------------------------------------------------------------------- -   Medications: Outpatient Medications Prior to Visit  Medication Sig   acetaminophen (TYLENOL) 650 MG CR tablet Take 650 mg by mouth every 8 (eight) hours as needed for pain.   albuterol (VENTOLIN HFA) 108 (90 Base) MCG/ACT inhaler Inhale 2 puffs into the lungs every 6 (six) hours as needed for wheezing or shortness of breath.   amiodarone (PACERONE) 400 MG tablet Take 200 mg by mouth daily in the afternoon.   apixaban (ELIQUIS) 5 MG TABS tablet Take 5 mg by mouth 2 (two) times daily.   atorvastatin (LIPITOR) 20 MG tablet Take 1 tablet (20 mg total) by mouth daily. (Patient taking differently: Take 20 mg by mouth at bedtime.)   Calcium Carb-Cholecalciferol (CALCIUM 600+D) 600-800 MG-UNIT TABS Take 2 tablets by mouth every evening.   Chlorpheniramine Maleate (ALLERGY RELIEF PO) Take by mouth.   Cholecalciferol (VITAMIN D3) 50 MCG (2000 UT) capsule Take 2,000 Units by mouth daily.   Dermatological Products, Misc. (BURN SPRAY EX)  Apply topically.   diphenhydrAMINE HCl (BENADRYL PO) Take by mouth.   furosemide (LASIX) 20 MG tablet Take 1 tablet (20 mg total) by mouth daily. (Patient taking differently: Take 40 mg by mouth daily.)   levothyroxine (SYNTHROID) 125 MCG tablet TAKE 1 TABLET BY MOUTH  DAILY BEFORE BREAKFAST   losartan (COZAAR) 100 MG tablet TAKE 1 TABLET BY MOUTH   DAILY   metoprolol succinate (TOPROL-XL) 50 MG 24 hr tablet Take 0.5 tablets (25 mg total) by mouth daily.   metroNIDAZOLE (METROGEL) 0.75 % gel Apply 1 application topically 2 (two) times daily.   MULTIPLE VITAMIN PO Take 1 tablet by mouth daily. Centrum 50 +   Multiple Vitamins-Minerals (OCUVITE ADULT 50+) CAPS Take 1 capsule by mouth daily.   OVER THE COUNTER MEDICATION Take 1 Scoop by mouth daily. Super Beets in glass of water 8 oz   potassium chloride SA (KLOR-CON) 20 MEQ tablet TAKE 1 TABLET BY MOUTH  DAILY WITH LASIX   triamcinolone (KENALOG) 0.1 % APPLY TOPICALLY TWO TIMES  DAILY   Zinc 50 MG CAPS Take 50 mg by mouth daily.   No facility-administered medications prior to visit.    Review of Systems  Constitutional:  Positive for activity change.  All other systems reviewed and are negative.    Objective    BP (!) 143/67    Pulse (!) 56    Resp 18    Wt 282 lb 3.2 oz (128 kg)    SpO2 97%    BMI 49.99 kg/m    Physical Exam Vitals and nursing note reviewed.  Constitutional:      General: She is not in acute distress.    Appearance: She is obese.  HENT:     Head: Normocephalic and atraumatic.  Neck:     Vascular: No carotid bruit.  Cardiovascular:     Rate and Rhythm: Normal rate and regular rhythm.     Pulses: Normal pulses.     Heart sounds: Normal heart sounds.  Pulmonary:     Effort: Pulmonary effort is normal.     Breath sounds: Normal breath sounds.  Abdominal:     General: Abdomen is flat.     Palpations: Abdomen is soft.  Musculoskeletal:        General: Swelling and tenderness (over the wound, R>L) present.     Right lower leg: Edema present.     Left lower leg: Edema present.  Neurological:     Mental Status: She is alert.      Assessment & Plan    Skin ulcer /Drug induced rash B/L LE  As per Dr. Jimmye Norman 2/22-2/25/23: Pt presented w/ what initially thought to be b/l LE cellulitis w/ blisters & likely drug rash. Pt was taking po bactrim prior to  admission and then pt was started on IV ancef as per ID. The rash got worse w/ IV ancef use so it was d/c by ID. The b/l LE swelling w/ blisters was likely secondary to significant underlying edema, as pt has hx of CHF. Pt did receive diuretics while inpatient & wound care of b/l LE. Pt was d/c home w/ home health for wound care & PT.  - Ambulatory referral to home health care due to patient's restricted mobility, open wounds /ulcers, complicated medical history (CHF, Afib...)  - FU with Dr.B - FU with cardiology per ED provider  I discussed the assessment and treatment plan with the patient The patient was provided an opportunity to ask questions and  all were answered. The patient agreed with the plan and demonstrated an understanding of the instructions.   The patient was advised to call back or seek an in-person evaluation if the symptoms worsen or if the condition fails to improve as anticipated  [employer] as a Education administrator for [employer], PA-C.,have documented all relevant documentation on the behalf of Mardene Speak, PA-C,as directed by  [employer], PA-C while in the presence of [employer], PA-C.   Mardene Speak, PA-C  [facility] [phone] (phone) [phone] (fax)  [place]

## 2021-07-11 NOTE — Addendum Note (Signed)
Addended by: Mardene Speak on: 07/11/2021 04:40 PM   Modules accepted: Orders

## 2021-07-11 NOTE — Addendum Note (Signed)
Addended by: Minette Headland on: 07/11/2021 02:15 PM   Modules accepted: Orders

## 2021-07-11 NOTE — Telephone Encounter (Signed)
OK for verbals 

## 2021-07-11 NOTE — Telephone Encounter (Signed)
FYI were seeing patient today for hospital follow up visit. KW

## 2021-07-13 DIAGNOSIS — I5042 Chronic combined systolic (congestive) and diastolic (congestive) heart failure: Secondary | ICD-10-CM | POA: Diagnosis not present

## 2021-07-13 DIAGNOSIS — R011 Cardiac murmur, unspecified: Secondary | ICD-10-CM | POA: Diagnosis not present

## 2021-07-13 DIAGNOSIS — Z4800 Encounter for change or removal of nonsurgical wound dressing: Secondary | ICD-10-CM | POA: Diagnosis not present

## 2021-07-13 DIAGNOSIS — Z87891 Personal history of nicotine dependence: Secondary | ICD-10-CM | POA: Diagnosis not present

## 2021-07-13 DIAGNOSIS — I350 Nonrheumatic aortic (valve) stenosis: Secondary | ICD-10-CM | POA: Diagnosis not present

## 2021-07-13 DIAGNOSIS — R238 Other skin changes: Secondary | ICD-10-CM | POA: Diagnosis not present

## 2021-07-13 DIAGNOSIS — Z7901 Long term (current) use of anticoagulants: Secondary | ICD-10-CM | POA: Diagnosis not present

## 2021-07-13 DIAGNOSIS — I480 Paroxysmal atrial fibrillation: Secondary | ICD-10-CM | POA: Diagnosis not present

## 2021-07-13 DIAGNOSIS — J449 Chronic obstructive pulmonary disease, unspecified: Secondary | ICD-10-CM | POA: Diagnosis not present

## 2021-07-13 DIAGNOSIS — E785 Hyperlipidemia, unspecified: Secondary | ICD-10-CM | POA: Diagnosis not present

## 2021-07-13 DIAGNOSIS — Z9181 History of falling: Secondary | ICD-10-CM | POA: Diagnosis not present

## 2021-07-13 DIAGNOSIS — N184 Chronic kidney disease, stage 4 (severe): Secondary | ICD-10-CM | POA: Diagnosis not present

## 2021-07-13 DIAGNOSIS — I130 Hypertensive heart and chronic kidney disease with heart failure and stage 1 through stage 4 chronic kidney disease, or unspecified chronic kidney disease: Secondary | ICD-10-CM | POA: Diagnosis not present

## 2021-07-13 DIAGNOSIS — E039 Hypothyroidism, unspecified: Secondary | ICD-10-CM | POA: Diagnosis not present

## 2021-07-14 ENCOUNTER — Ambulatory Visit (INDEPENDENT_AMBULATORY_CARE_PROVIDER_SITE_OTHER): Payer: Medicare Other

## 2021-07-14 DIAGNOSIS — I5042 Chronic combined systolic (congestive) and diastolic (congestive) heart failure: Secondary | ICD-10-CM

## 2021-07-14 DIAGNOSIS — I10 Essential (primary) hypertension: Secondary | ICD-10-CM

## 2021-07-14 DIAGNOSIS — E7800 Pure hypercholesterolemia, unspecified: Secondary | ICD-10-CM

## 2021-07-14 DIAGNOSIS — I4891 Unspecified atrial fibrillation: Secondary | ICD-10-CM

## 2021-07-14 NOTE — Chronic Care Management (AMB) (Incomplete)
Chronic Care Management   CCM RN Visit Note  07/14/2021 Name: Mary Kennedy MRN: [id number] DOB: 02-27-1941  Subjective: Mary Kennedy is a 81 y.o. year old female who is a primary care patient of Bacigalupo, Dionne Bucy, MD. The care management team was consulted for assistance with disease management and care coordination needs.    Engaged with patient by telephone for initial visit in response to provider referral for case management and care coordination services.   Consent to Services:  The patient was given the following information about Chronic Care Management services 1. CCM service includes personalized support from designated clinical staff supervised by the primary care provider, including individualized plan of care and coordination with other care providers 2. 24/7 contact phone numbers for assistance for urgent and routine care needs. 3. Service will only be billed when office clinical staff spend 20 minutes or more in a month to coordinate care. 4. Only one practitioner may furnish and bill the service in a calendar month. 5.The patient may stop CCM services at any time (effective at the end of the month) by phone call to the office staff. 6. The patient will be responsible for cost sharing (co-pay) of up to 20% of the service fee (after annual deductible is met). Patient agreed to services and consent obtained.  Assessment: Review of patient past medical history, allergies, medications, health status, including review of consultants reports, laboratory and other test data, was performed as part of comprehensive evaluation and provision of chronic care management services.   SDOH (Social Determinants of Health) assessments and interventions performed:  SDOH Interventions    Flowsheet Row Most Recent Value  SDOH Interventions   Food Insecurity Interventions Intervention Not Indicated  Transportation Interventions Intervention Not Indicated        CCM Care  Plan  Allergies  Allergen Reactions   Bactrim [Sulfamethoxazole-Trimethoprim] Rash    Rash with blisters on legs   Lisinopril Anaphylaxis, Swelling and Cough    Close airway   Other Swelling    Persimmon causes mouth and throat swelling   Tegaderm Ag Mesh 2"X2"  [Wound Dressings]     when removing the patch her skin came off.   Wound Dressing Adhesive     Adhesive from covering for PICC line tore skin   Cefazolin In Sodium Chloride Rash   Ciprofloxacin Hives and Rash   Latex Rash    Adhesive from covering for PICC line tore skin   Penicillins Swelling and Rash    Outpatient Encounter Medications as of 07/14/2021  Medication Sig Note   acetaminophen (TYLENOL) 650 MG CR tablet Take 650 mg by mouth every 8 (eight) hours as needed for pain.    albuterol (VENTOLIN HFA) 108 (90 Base) MCG/ACT inhaler Inhale 2 puffs into the lungs every 6 (six) hours as needed for wheezing or shortness of breath. 07/03/2021: Last filled 06/05/21    amiodarone (PACERONE) 400 MG tablet Take 200 mg by mouth daily in the afternoon. 07/03/2021: 200 mg tabs filled 06/16/21 90 day supply   apixaban (ELIQUIS) 5 MG TABS tablet Take 5 mg by mouth 2 (two) times daily. 07/03/2021: Last filled 06/16/21 90 day supply   atorvastatin (LIPITOR) 20 MG tablet Take 1 tablet (20 mg total) by mouth daily. (Patient taking differently: Take 20 mg by mouth at bedtime.)    Calcium Carb-Cholecalciferol (CALCIUM 600+D) 600-800 MG-UNIT TABS Take 2 tablets by mouth every evening.    Chlorpheniramine Maleate (ALLERGY RELIEF PO) Take by mouth.  Cholecalciferol (VITAMIN D3) 50 MCG (2000 UT) capsule Take 2,000 Units by mouth daily.    Dermatological Products, Misc. (BURN SPRAY EX) Apply topically.    diphenhydrAMINE HCl (BENADRYL PO) Take by mouth.    furosemide (LASIX) 20 MG tablet Take 1 tablet (20 mg total) by mouth daily. (Patient taking differently: Take 40 mg by mouth daily.) 07/11/2021: Pt taking 30m daily     levothyroxine (SYNTHROID) 125  MCG tablet TAKE 1 TABLET BY MOUTH  DAILY BEFORE BREAKFAST 07/03/2021: Last filled 04/25/21 90 days supply   losartan (COZAAR) 100 MG tablet TAKE 1 TABLET BY MOUTH  DAILY 07/03/2021: Last filled 03/16/21 90 day supply   metoprolol succinate (TOPROL-XL) 50 MG 24 hr tablet Take 0.5 tablets (25 mg total) by mouth daily. 07/03/2021: Last filled 05/04/21 90 day supply   metroNIDAZOLE (METROGEL) 0.75 % gel Apply 1 application topically 2 (two) times daily. 07/03/2021: Filled 06/02/21 90 day supply   MULTIPLE VITAMIN PO Take 1 tablet by mouth daily. Centrum 50 +    Multiple Vitamins-Minerals (OCUVITE ADULT 50+) CAPS Take 1 capsule by mouth daily.    OVER THE COUNTER MEDICATION Take 1 Scoop by mouth daily. Super Beets in glass of water 8 oz    potassium chloride SA (KLOR-CON) 20 MEQ tablet TAKE 1 TABLET BY MOUTH  DAILY WITH LASIX 07/03/2021: Last filled 02/15/21 90 day supply   triamcinolone (KENALOG) 0.1 % APPLY TOPICALLY TWO TIMES  DAILY    Zinc 50 MG CAPS Take 50 mg by mouth daily.    No facility-administered encounter medications on file as of 07/14/2021.    Patient Active Problem List   Diagnosis Date Noted   CKD (chronic kidney disease) stage 4, GFR 15-29 ml/min (HCC) 07/04/2021   Drug rash 07/04/2021   Aortic stenosis    Cellulitis of lower extremity 07/03/2021   Cellulitis 07/03/2021   Stage 3b chronic kidney disease ([facility] 09/19/2020   Atrial fibrillation ([facility] 10/16/2019   Chronic combined systolic and diastolic heart failure ([facility] 10/16/2019   Swelling of limb 07/21/2019   Benign neoplasm of cecum    Benign neoplasm of ascending colon    Bradycardia 04/25/2015   H/O cardiovascular disorder 12/01/2014   LBP (low back pain) 12/01/2014   Adenopathy 12/01/2014   Adenitis, salivary, recurring 12/01/2014   Absence of bladder continence 12/01/2014   Arthritis of knee, degenerative 09/30/2013   Carotid artery obstruction 04/20/2009   Obesity, Class III, BMI 40-49.9 (morbid obesity) ([facility] 10/19/2008    Family history of cardiovascular disease 07/06/2007   Mitral regurgitation 02/23/2007   History of colonic polyps 02/23/2007   Rosacea 02/23/2007   Menopausal symptom 02/23/2007   Hyperlipidemia 02/19/2007   Adult hypothyroidism 02/19/2007    PLAN

## 2021-07-14 NOTE — Patient Instructions (Signed)
Visit Information   Thank you for taking time to visit with me today. Please don't hesitate to contact me if I can be of assistance to you before our next scheduled telephone appointment.  Following are the goals we discussed today:  (Copy and paste patient goals from clinical care plan here)  Our next appointment is    Following is a copy of your full care plan:     Consent to CCM Services: Ms. Jarnagin was given information about Chronic Care Management services including:  CCM service includes personalized support from designated clinical staff supervised by her physician, including individualized plan of care and coordination with other care providers 24/7 contact phone numbers for assistance for urgent and routine care needs. Service will only be billed when office clinical staff spend 20 minutes or more in a month to coordinate care. Only one practitioner may furnish and bill the service in a calendar month. The patient may stop CCM services at any time (effective at the end of the month) by phone call to the office staff. The patient will be responsible for cost sharing (co-pay) of up to 20% of the service fee (after annual deductible is met).  Patient agreed to services and verbal consent obtained.   The patient verbalized understanding of instructions, educational materials, and care plan provided today and declined offer to receive copy of patient instructions, educational materials, and care plan.    Cristy [facility] Care Management [facility] [phone]

## 2021-07-17 ENCOUNTER — Telehealth: Payer: Self-pay

## 2021-07-17 DIAGNOSIS — I5042 Chronic combined systolic (congestive) and diastolic (congestive) heart failure: Secondary | ICD-10-CM | POA: Diagnosis not present

## 2021-07-17 DIAGNOSIS — Z9181 History of falling: Secondary | ICD-10-CM | POA: Diagnosis not present

## 2021-07-17 DIAGNOSIS — E039 Hypothyroidism, unspecified: Secondary | ICD-10-CM | POA: Diagnosis not present

## 2021-07-17 DIAGNOSIS — J449 Chronic obstructive pulmonary disease, unspecified: Secondary | ICD-10-CM | POA: Diagnosis not present

## 2021-07-17 DIAGNOSIS — Z87891 Personal history of nicotine dependence: Secondary | ICD-10-CM | POA: Diagnosis not present

## 2021-07-17 DIAGNOSIS — E785 Hyperlipidemia, unspecified: Secondary | ICD-10-CM | POA: Diagnosis not present

## 2021-07-17 DIAGNOSIS — I350 Nonrheumatic aortic (valve) stenosis: Secondary | ICD-10-CM | POA: Diagnosis not present

## 2021-07-17 DIAGNOSIS — R011 Cardiac murmur, unspecified: Secondary | ICD-10-CM | POA: Diagnosis not present

## 2021-07-17 DIAGNOSIS — N184 Chronic kidney disease, stage 4 (severe): Secondary | ICD-10-CM | POA: Diagnosis not present

## 2021-07-17 DIAGNOSIS — R238 Other skin changes: Secondary | ICD-10-CM | POA: Diagnosis not present

## 2021-07-17 DIAGNOSIS — I480 Paroxysmal atrial fibrillation: Secondary | ICD-10-CM | POA: Diagnosis not present

## 2021-07-17 DIAGNOSIS — Z7901 Long term (current) use of anticoagulants: Secondary | ICD-10-CM | POA: Diagnosis not present

## 2021-07-17 DIAGNOSIS — I130 Hypertensive heart and chronic kidney disease with heart failure and stage 1 through stage 4 chronic kidney disease, or unspecified chronic kidney disease: Secondary | ICD-10-CM | POA: Diagnosis not present

## 2021-07-17 DIAGNOSIS — Z4800 Encounter for change or removal of nonsurgical wound dressing: Secondary | ICD-10-CM | POA: Diagnosis not present

## 2021-07-17 NOTE — Telephone Encounter (Signed)
OK for verbals 

## 2021-07-17 NOTE — Telephone Encounter (Signed)
[facility] advised.  ?

## 2021-07-17 NOTE — Telephone Encounter (Signed)
Copied from [place] [id number]. Topic: Quick Communication - Home Health Verbal Orders ?>> Jul 17, 2021  3:26 PM McGill, Nelva Bush wrote: ?Caller/Agency: [facility] ?Callback Number: [phone] ?Requesting OT/PT/Skilled Nursing/Social Work/Speech Therapy: Nursing ?Frequency: 1w9,2PRN ?

## 2021-07-19 ENCOUNTER — Telehealth: Payer: Self-pay

## 2021-07-19 DIAGNOSIS — Z9181 History of falling: Secondary | ICD-10-CM | POA: Diagnosis not present

## 2021-07-19 DIAGNOSIS — I480 Paroxysmal atrial fibrillation: Secondary | ICD-10-CM | POA: Diagnosis not present

## 2021-07-19 DIAGNOSIS — I130 Hypertensive heart and chronic kidney disease with heart failure and stage 1 through stage 4 chronic kidney disease, or unspecified chronic kidney disease: Secondary | ICD-10-CM | POA: Diagnosis not present

## 2021-07-19 DIAGNOSIS — I5042 Chronic combined systolic (congestive) and diastolic (congestive) heart failure: Secondary | ICD-10-CM | POA: Diagnosis not present

## 2021-07-19 DIAGNOSIS — I350 Nonrheumatic aortic (valve) stenosis: Secondary | ICD-10-CM | POA: Diagnosis not present

## 2021-07-19 DIAGNOSIS — R238 Other skin changes: Secondary | ICD-10-CM | POA: Diagnosis not present

## 2021-07-19 DIAGNOSIS — R011 Cardiac murmur, unspecified: Secondary | ICD-10-CM | POA: Diagnosis not present

## 2021-07-19 DIAGNOSIS — N184 Chronic kidney disease, stage 4 (severe): Secondary | ICD-10-CM | POA: Diagnosis not present

## 2021-07-19 DIAGNOSIS — Z87891 Personal history of nicotine dependence: Secondary | ICD-10-CM | POA: Diagnosis not present

## 2021-07-19 DIAGNOSIS — Z7901 Long term (current) use of anticoagulants: Secondary | ICD-10-CM | POA: Diagnosis not present

## 2021-07-19 DIAGNOSIS — J449 Chronic obstructive pulmonary disease, unspecified: Secondary | ICD-10-CM | POA: Diagnosis not present

## 2021-07-19 DIAGNOSIS — E785 Hyperlipidemia, unspecified: Secondary | ICD-10-CM | POA: Diagnosis not present

## 2021-07-19 DIAGNOSIS — E039 Hypothyroidism, unspecified: Secondary | ICD-10-CM | POA: Diagnosis not present

## 2021-07-19 DIAGNOSIS — Z4800 Encounter for change or removal of nonsurgical wound dressing: Secondary | ICD-10-CM | POA: Diagnosis not present

## 2021-07-19 NOTE — Telephone Encounter (Signed)
Copied from [place] [id number]. Topic: Quick Communication - Home Health Verbal Orders ?>> Jul 19, 2021  3:24 PM Pawlus, Brayton Layman A wrote: ?Caller/Agency: [facility]  ?Callback Number: [phone] ?Requesting: PT ?Frequency: 2x2, 1x3 ?

## 2021-07-21 ENCOUNTER — Ambulatory Visit: Payer: Self-pay | Admitting: *Deleted

## 2021-07-21 NOTE — Telephone Encounter (Signed)
OK for verbals 

## 2021-07-21 NOTE — Telephone Encounter (Signed)
?  Chief Complaint: hives on arms and spreading rash. Requesting medication ?Symptoms: reports hives, rash red raised, skin peeling , itching, rash looks like whelps on arms and "all over" ?Frequency: on and off since leaving hospital and seen by PCP  ?Pertinent Negatives: Patient denies chest pain , difficulty breathing no fever no drainage from rash.  ?Disposition: '[]'$ ED /'[]'$ Urgent Care (no appt availability in office) / '[]'$ Appointment(In office/virtual)/ '[]'$  [facility]/ '[]'$ Home Care/ '[x]'$ Refused Recommended Disposition /'[]'$ [facility]/ '[x]'$  Follow-up with PCP ?Additional Notes:  ? ?Recommended appt and patient reports she has seen PCP for same issue and want medication . Has tried calamine lotion, hydrocortisone cream with short term relief only. Please advise. Recommended UC /ED if rash worsens over weekend. ? ? Reason for Disposition ? Hives persist > 1 week ? ?Answer Assessment - Initial Assessment Questions ?1. APPEARANCE: "What does the rash look like?"  ?    Red whelps, skin  peeling  ?2. LOCATION: "Where is the rash located?"  ?    "All over" reports arms  ?3. NUMBER: "How many hives are there?"  ?    Na  ?4. SIZE: "How big are the hives?" (inches, cm, compare to coins) "Do they all look the same or is there lots of variation in shape and size?"  ?    na ?5. ONSET: "When did the hives begin?" (Hours or days ago)  ?    On going since last seen by PCP.  ?6. ITCHING: "Does it itch?" If Yes, ask: "How bad is the itch?"  ?  - MILD: doesn't interfere with normal activities ?  - MODERATE-SEVERE: interferes with work, school, sleep, or other activities  ?    Yes  ?7. RECURRENT PROBLEM: "Have you had hives before?" If Yes, ask: "When was the last time?" and "What happened that time?"  ?    Yes  ?8. TRIGGERS: "Were you exposed to any new food, plant, cosmetic product or animal just before the hives began?" ?    Getting hot  ?9. OTHER SYMPTOMS: "Do you have any other symptoms?" (e.g., fever, tongue  swelling, difficulty breathing, abdominal pain) ?    Denies  ?10. PREGNANCY: "Is there any chance you are pregnant?" "When was your last menstrual period?" ?      na ? ?Protocols used: Hives-A-AH ? ?

## 2021-07-21 NOTE — Telephone Encounter (Signed)
Left detailed voice message advising of approved orders.  ?

## 2021-07-24 NOTE — Telephone Encounter (Signed)
For hives, can take Zyrtec once daily and pepcid twice daily. If not improving, schedule appt to be re-evaluated with [place] or Derm. ?

## 2021-07-24 NOTE — Telephone Encounter (Signed)
Patient advised.

## 2021-07-26 DIAGNOSIS — R011 Cardiac murmur, unspecified: Secondary | ICD-10-CM | POA: Diagnosis not present

## 2021-07-26 DIAGNOSIS — Z4800 Encounter for change or removal of nonsurgical wound dressing: Secondary | ICD-10-CM | POA: Diagnosis not present

## 2021-07-26 DIAGNOSIS — Z87891 Personal history of nicotine dependence: Secondary | ICD-10-CM | POA: Diagnosis not present

## 2021-07-26 DIAGNOSIS — E785 Hyperlipidemia, unspecified: Secondary | ICD-10-CM | POA: Diagnosis not present

## 2021-07-26 DIAGNOSIS — N184 Chronic kidney disease, stage 4 (severe): Secondary | ICD-10-CM | POA: Diagnosis not present

## 2021-07-26 DIAGNOSIS — I480 Paroxysmal atrial fibrillation: Secondary | ICD-10-CM | POA: Diagnosis not present

## 2021-07-26 DIAGNOSIS — R238 Other skin changes: Secondary | ICD-10-CM | POA: Diagnosis not present

## 2021-07-26 DIAGNOSIS — I130 Hypertensive heart and chronic kidney disease with heart failure and stage 1 through stage 4 chronic kidney disease, or unspecified chronic kidney disease: Secondary | ICD-10-CM | POA: Diagnosis not present

## 2021-07-26 DIAGNOSIS — Z9181 History of falling: Secondary | ICD-10-CM | POA: Diagnosis not present

## 2021-07-26 DIAGNOSIS — I5042 Chronic combined systolic (congestive) and diastolic (congestive) heart failure: Secondary | ICD-10-CM | POA: Diagnosis not present

## 2021-07-26 DIAGNOSIS — Z7901 Long term (current) use of anticoagulants: Secondary | ICD-10-CM | POA: Diagnosis not present

## 2021-07-26 DIAGNOSIS — I350 Nonrheumatic aortic (valve) stenosis: Secondary | ICD-10-CM | POA: Diagnosis not present

## 2021-07-26 DIAGNOSIS — J449 Chronic obstructive pulmonary disease, unspecified: Secondary | ICD-10-CM | POA: Diagnosis not present

## 2021-07-26 DIAGNOSIS — E039 Hypothyroidism, unspecified: Secondary | ICD-10-CM | POA: Diagnosis not present

## 2021-07-27 ENCOUNTER — Telehealth: Payer: Self-pay | Admitting: Family Medicine

## 2021-07-27 ENCOUNTER — Encounter: Payer: Self-pay | Admitting: Family Medicine

## 2021-07-27 ENCOUNTER — Other Ambulatory Visit (INDEPENDENT_AMBULATORY_CARE_PROVIDER_SITE_OTHER): Payer: Medicare Other | Admitting: Family Medicine

## 2021-07-27 DIAGNOSIS — Z87891 Personal history of nicotine dependence: Secondary | ICD-10-CM | POA: Diagnosis not present

## 2021-07-27 DIAGNOSIS — Z7901 Long term (current) use of anticoagulants: Secondary | ICD-10-CM | POA: Diagnosis not present

## 2021-07-27 DIAGNOSIS — I130 Hypertensive heart and chronic kidney disease with heart failure and stage 1 through stage 4 chronic kidney disease, or unspecified chronic kidney disease: Secondary | ICD-10-CM | POA: Diagnosis not present

## 2021-07-27 DIAGNOSIS — Z4800 Encounter for change or removal of nonsurgical wound dressing: Secondary | ICD-10-CM | POA: Diagnosis not present

## 2021-07-27 DIAGNOSIS — N184 Chronic kidney disease, stage 4 (severe): Secondary | ICD-10-CM | POA: Diagnosis not present

## 2021-07-27 DIAGNOSIS — E039 Hypothyroidism, unspecified: Secondary | ICD-10-CM | POA: Diagnosis not present

## 2021-07-27 DIAGNOSIS — R011 Cardiac murmur, unspecified: Secondary | ICD-10-CM | POA: Diagnosis not present

## 2021-07-27 DIAGNOSIS — J449 Chronic obstructive pulmonary disease, unspecified: Secondary | ICD-10-CM | POA: Diagnosis not present

## 2021-07-27 DIAGNOSIS — I5042 Chronic combined systolic (congestive) and diastolic (congestive) heart failure: Secondary | ICD-10-CM | POA: Diagnosis not present

## 2021-07-27 DIAGNOSIS — R238 Other skin changes: Secondary | ICD-10-CM | POA: Diagnosis not present

## 2021-07-27 DIAGNOSIS — I480 Paroxysmal atrial fibrillation: Secondary | ICD-10-CM | POA: Diagnosis not present

## 2021-07-27 DIAGNOSIS — Z9181 History of falling: Secondary | ICD-10-CM | POA: Diagnosis not present

## 2021-07-27 DIAGNOSIS — E785 Hyperlipidemia, unspecified: Secondary | ICD-10-CM | POA: Diagnosis not present

## 2021-07-27 DIAGNOSIS — I350 Nonrheumatic aortic (valve) stenosis: Secondary | ICD-10-CM | POA: Diagnosis not present

## 2021-07-27 NOTE — Progress Notes (Signed)
Received home health orders orders from [facility]. ?Start of care 07/13/2021.   Certification and orders from 07/13/2021 through 09/10/2021 are reviewed, signed and faxed back to home health company. ? ?Patient is receiving home health services for the following diagnoses: ? ?Problem List Items Addressed This Visit   ? ?  ? Cardiovascular and Mediastinum  ? Atrial fibrillation ([place])  ? Chronic combined systolic and diastolic heart failure ([place])  ? Aortic stenosis  ?  ? Other  ? Hyperlipidemia  ? ?Other Visit Diagnoses   ? ? Hypertensive heart and renal disease with congestive heart failure ([place])    -  Primary  ? Chronic kidney disease, stage IV (severe) ([place])      ? Vesicular eruption of skin      ? Primary hypothyroidism      ? Cardiac murmur      ? Obstructive chronic bronchitis without exacerbation ([place])      ? Morbid obesity ([place])      ? Change of dressing      ? Personal history of fall      ? Long term (current) use of anticoagulants      ? Personal history of tobacco use, presenting hazards to health      ? Body mass index 45.0-49.9, adult ([place])      ? ?  ?  ? ?Virginia Crews, MD, MPH ?[place] ?[facility]  ?

## 2021-07-27 NOTE — Telephone Encounter (Signed)
Cheryl with [facility] called saying pat has cellulitis on both legs and wants an order for an antibiotic sent in.  Pt has several allergies to antibiotics.  Consider a topical cream as triamcinolone.  Referral to dermatology. ? ?Cheryls @ [phone] ? ?  ?

## 2021-07-27 NOTE — Telephone Encounter (Signed)
Patient needs to be seen. Can be virtual if she prefers ?

## 2021-07-28 ENCOUNTER — Encounter: Payer: Self-pay | Admitting: Physician Assistant

## 2021-07-28 ENCOUNTER — Other Ambulatory Visit: Payer: Self-pay

## 2021-07-28 ENCOUNTER — Ambulatory Visit (INDEPENDENT_AMBULATORY_CARE_PROVIDER_SITE_OTHER): Payer: Medicare Other | Admitting: Physician Assistant

## 2021-07-28 VITALS — BP 131/75 | HR 57 | Temp 97.7°F | Resp 16 | Wt 272.0 lb

## 2021-07-28 DIAGNOSIS — Z9181 History of falling: Secondary | ICD-10-CM | POA: Diagnosis not present

## 2021-07-28 DIAGNOSIS — Z87891 Personal history of nicotine dependence: Secondary | ICD-10-CM | POA: Diagnosis not present

## 2021-07-28 DIAGNOSIS — I350 Nonrheumatic aortic (valve) stenosis: Secondary | ICD-10-CM | POA: Diagnosis not present

## 2021-07-28 DIAGNOSIS — E785 Hyperlipidemia, unspecified: Secondary | ICD-10-CM | POA: Diagnosis not present

## 2021-07-28 DIAGNOSIS — E039 Hypothyroidism, unspecified: Secondary | ICD-10-CM | POA: Diagnosis not present

## 2021-07-28 DIAGNOSIS — I480 Paroxysmal atrial fibrillation: Secondary | ICD-10-CM | POA: Diagnosis not present

## 2021-07-28 DIAGNOSIS — I130 Hypertensive heart and chronic kidney disease with heart failure and stage 1 through stage 4 chronic kidney disease, or unspecified chronic kidney disease: Secondary | ICD-10-CM | POA: Diagnosis not present

## 2021-07-28 DIAGNOSIS — L03119 Cellulitis of unspecified part of limb: Secondary | ICD-10-CM | POA: Diagnosis not present

## 2021-07-28 DIAGNOSIS — R238 Other skin changes: Secondary | ICD-10-CM | POA: Diagnosis not present

## 2021-07-28 DIAGNOSIS — Z4800 Encounter for change or removal of nonsurgical wound dressing: Secondary | ICD-10-CM | POA: Diagnosis not present

## 2021-07-28 DIAGNOSIS — N184 Chronic kidney disease, stage 4 (severe): Secondary | ICD-10-CM | POA: Diagnosis not present

## 2021-07-28 DIAGNOSIS — Z7901 Long term (current) use of anticoagulants: Secondary | ICD-10-CM | POA: Diagnosis not present

## 2021-07-28 DIAGNOSIS — I5042 Chronic combined systolic (congestive) and diastolic (congestive) heart failure: Secondary | ICD-10-CM | POA: Diagnosis not present

## 2021-07-28 DIAGNOSIS — J449 Chronic obstructive pulmonary disease, unspecified: Secondary | ICD-10-CM | POA: Diagnosis not present

## 2021-07-28 DIAGNOSIS — R011 Cardiac murmur, unspecified: Secondary | ICD-10-CM | POA: Diagnosis not present

## 2021-07-28 MED ORDER — DOXYCYCLINE HYCLATE 100 MG PO TABS: 100.0000 mg | ORAL_TABLET | Freq: Two times a day (BID) | ORAL | 0 refills | Status: DC

## 2021-07-28 NOTE — Progress Notes (Signed)
?  ? ?I,Joseline E Rosas,acting as a scribe for [employer], PA-C.,have documented all relevant documentation on the behalf of [place], PA-C,as directed by  [employer], PA-C while in the presence of Sadira Standard E Jaryn Hocutt, PA-C.  ? ?Established patient visit ? ? ?[name]   DOB: 10-12-1940   81 y.o. Female  MRN: [id number] ?Visit Date: 07/28/2021 ? ?Today's healthcare provider: Dani Gobble Mandi Mattioli, PA-C  ?Introduced myself to the patient as a Journalist, newspaper and provided education on APPs in clinical practice.  ? ? ?CC: Rash and concern for cellulitis  ? ?Subjective  ?  ?HPI  ?Patient here today with concerns of cellulitis, lower extremity. Reports that she has been seen by two providers here in the office and was sent to ED for evaluation several times. ? ?States she came in in Feb for concerns for cellulitis and was treated with Bactrim  ?She went to ED and was given another ABX - clindamycin  ? ?Reports she has been itching all over ?States her skin is very dry - despite using creams and increasing water intake ?She states she has not been given any topical medications to apply  ?She denies pain in the area or drainage since leaving hospital  ? ?Medications: ?Outpatient Medications Prior to Visit  ?Medication Sig  ? acetaminophen (TYLENOL) 650 MG CR tablet Take 650 mg by mouth every 8 (eight) hours as needed for pain.  ? albuterol (VENTOLIN HFA) 108 (90 Base) MCG/ACT inhaler Inhale 2 puffs into the lungs every 6 (six) hours as needed for wheezing or shortness of breath.  ? amiodarone (PACERONE) 400 MG tablet Take 200 mg by mouth daily in the afternoon.  ? apixaban (ELIQUIS) 5 MG TABS tablet Take 5 mg by mouth 2 (two) times daily.  ? atorvastatin (LIPITOR) 20 MG tablet Take 1 tablet (20 mg total) by mouth daily. (Patient taking differently: Take 20 mg by mouth at bedtime.)  ? Calcium Carb-Cholecalciferol (CALCIUM 600+D) 600-800 MG-UNIT TABS Take 2 tablets by mouth every evening.  ? Chlorpheniramine Maleate (ALLERGY  RELIEF PO) Take by mouth.  ? Cholecalciferol (VITAMIN D3) 50 MCG (2000 UT) capsule Take 2,000 Units by mouth daily.  ? diphenhydrAMINE HCl (BENADRYL PO) Take by mouth.  ? furosemide (LASIX) 20 MG tablet Take 1 tablet (20 mg total) by mouth daily. (Patient taking differently: Take 40 mg by mouth daily.)  ? levothyroxine (SYNTHROID) 125 MCG tablet TAKE 1 TABLET BY MOUTH  DAILY BEFORE BREAKFAST  ? losartan (COZAAR) 100 MG tablet TAKE 1 TABLET BY MOUTH  DAILY  ? metoprolol succinate (TOPROL-XL) 50 MG 24 hr tablet Take 0.5 tablets (25 mg total) by mouth daily.  ? metroNIDAZOLE (METROGEL) 0.75 % gel Apply 1 application topically 2 (two) times daily.  ? MULTIPLE VITAMIN PO Take 1 tablet by mouth daily. Centrum 50 +  ? Multiple Vitamins-Minerals (OCUVITE ADULT 50+) CAPS Take 1 capsule by mouth daily.  ? OVER THE COUNTER MEDICATION Take 1 Scoop by mouth daily. Super Beets in glass of water 8 oz  ? potassium chloride SA (KLOR-CON) 20 MEQ tablet TAKE 1 TABLET BY MOUTH  DAILY WITH LASIX  ? triamcinolone (KENALOG) 0.1 % APPLY TOPICALLY TWO TIMES  DAILY  ? Zinc 50 MG CAPS Take 50 mg by mouth daily.  ? ?No facility-administered medications prior to visit.  ? ? ?Review of Systems  ?Constitutional:  Negative for fatigue and fever.  ?Cardiovascular:  Positive for leg swelling. Negative for chest pain.  ?  Musculoskeletal:  Negative for gait problem.  ?Skin:  Positive for color change and rash.  ?Neurological:  Negative for dizziness, light-headedness and headaches.  ? ? ?  Objective  ?  ?BP 131/75 (BP Location: Left Arm, Patient Position: Sitting, Cuff Size: Large)   Pulse (!) 57   Temp 97.7 ?F (36.5 ?C) (Temporal)   Resp 16   Wt 272 lb (123.4 kg)   BMI 48.18 kg/m?  ? ? ?Physical Exam ?Vitals reviewed.  ?Constitutional:   ?   General: She is awake.  ?   Appearance: Normal appearance. She is well-developed and well-groomed. She is morbidly obese.  ?HENT:  ?   Head: Normocephalic and atraumatic.  ?Cardiovascular:  ?   Rate and  Rhythm: Normal rate and regular rhythm.  ?   Pulses:     ?     Dorsalis pedis pulses are 0 on the right side and 0 on the left side.  ?     Posterior tibial pulses are 0 on the right side and 0 on the left side.  ?   Heart sounds: Murmur heard.  ?Systolic murmur is present with a grade of 3/6.  ?   Comments: Holosystolic blowing 3/6 murmur appreciated.  ?Pulmonary:  ?   Effort: Pulmonary effort is normal.  ?   Breath sounds: Normal breath sounds. No decreased air movement. No decreased breath sounds, wheezing, rhonchi or rales.  ?Musculoskeletal:  ?   Right lower leg: 1+ Edema present.  ?Skin: ?   General: Skin is warm.  ?   Capillary Refill: Capillary refill takes 2 to 3 seconds.  ?   Findings: Erythema and rash present.  ? ?    ?Neurological:  ?   Mental Status: She is alert.  ?Psychiatric:     ?   Attention and Perception: Attention normal.     ?   Mood and Affect: Mood normal.     ?   Speech: Speech normal.     ?   Behavior: Behavior normal. Behavior is cooperative.     ?   Thought Content: Thought content normal.  ?  ? ? ?No results found for any visits on 07/28/21. ? Assessment & Plan  ?  ? ?Problem List Items Addressed This Visit   ? ?  ? Other  ? Cellulitis of lower extremity - Primary ? ?Ongoing concern ?Patient reports she had an issue with fluid retention earlier and noticed signs of cellulitis  ?She is concerned given her history of abx reactions and hospitalization from this the last time it occurred ?Physical exam demonstrates signs c/w cellulitis predominantly on the left shin  ?Reviewed previous medications and patient has had Doxycycline without issue ?Will begin Doxycycline 100 mg PO BID x 7 days  ?Reviewed ED precautions and skin barrier maintenance.  ? ?  ? Relevant Medications  ? doxycycline (VIBRA-TABS) 100 MG tablet  ? ? ? ?No follow-ups on file. ? ? ?I, Ivylynn Hoppes E Verlie Liotta, PA-C, have reviewed all documentation for this visit. The documentation on 07/28/21 for the exam, diagnosis, procedures, and  orders are all accurate and complete. ? ? ?[place] Nishi, [place] MPH ?[place] ?[facility] ? ? ? ? ?No follow-ups on file.  ?   ? ? ? ? ?Anouk Critzer E Ary Rudnick, PA-C  ?[place] ?[phone] (phone) ?[phone] (fax) ? ?[facility]  ?

## 2021-07-28 NOTE — Patient Instructions (Signed)
To help with skin barrier protection I recommend that you use Aquaphor and Vaseline  ?Keep the area clean with a gentle cleanser and warm water, gently pat dry and apply ointments above ?Continue to use your compression stockings and elevate your legs as much as possible  ?I am sending in a script for Doxycycline '100mg'$  to be taken every 12 hours for 7 days ?Finish the entire course to help with adequate resolution of cellulitis ?Be sure to take with food and use a probiotic to assist with GI discomfort while taking the antibiotic ? ?

## 2021-07-31 DIAGNOSIS — N184 Chronic kidney disease, stage 4 (severe): Secondary | ICD-10-CM | POA: Diagnosis not present

## 2021-07-31 DIAGNOSIS — G4733 Obstructive sleep apnea (adult) (pediatric): Secondary | ICD-10-CM | POA: Diagnosis not present

## 2021-07-31 DIAGNOSIS — I480 Paroxysmal atrial fibrillation: Secondary | ICD-10-CM | POA: Diagnosis not present

## 2021-07-31 DIAGNOSIS — Z4800 Encounter for change or removal of nonsurgical wound dressing: Secondary | ICD-10-CM | POA: Diagnosis not present

## 2021-07-31 DIAGNOSIS — E785 Hyperlipidemia, unspecified: Secondary | ICD-10-CM | POA: Diagnosis not present

## 2021-07-31 DIAGNOSIS — Z9181 History of falling: Secondary | ICD-10-CM | POA: Diagnosis not present

## 2021-07-31 DIAGNOSIS — R011 Cardiac murmur, unspecified: Secondary | ICD-10-CM | POA: Diagnosis not present

## 2021-07-31 DIAGNOSIS — E039 Hypothyroidism, unspecified: Secondary | ICD-10-CM | POA: Diagnosis not present

## 2021-07-31 DIAGNOSIS — I5042 Chronic combined systolic (congestive) and diastolic (congestive) heart failure: Secondary | ICD-10-CM | POA: Diagnosis not present

## 2021-07-31 DIAGNOSIS — J449 Chronic obstructive pulmonary disease, unspecified: Secondary | ICD-10-CM | POA: Diagnosis not present

## 2021-07-31 DIAGNOSIS — R0609 Other forms of dyspnea: Secondary | ICD-10-CM | POA: Diagnosis not present

## 2021-07-31 DIAGNOSIS — I130 Hypertensive heart and chronic kidney disease with heart failure and stage 1 through stage 4 chronic kidney disease, or unspecified chronic kidney disease: Secondary | ICD-10-CM | POA: Diagnosis not present

## 2021-07-31 DIAGNOSIS — Z87891 Personal history of nicotine dependence: Secondary | ICD-10-CM | POA: Diagnosis not present

## 2021-07-31 DIAGNOSIS — R238 Other skin changes: Secondary | ICD-10-CM | POA: Diagnosis not present

## 2021-07-31 DIAGNOSIS — Z7901 Long term (current) use of anticoagulants: Secondary | ICD-10-CM | POA: Diagnosis not present

## 2021-07-31 DIAGNOSIS — I350 Nonrheumatic aortic (valve) stenosis: Secondary | ICD-10-CM | POA: Diagnosis not present

## 2021-08-02 DIAGNOSIS — Z87891 Personal history of nicotine dependence: Secondary | ICD-10-CM | POA: Diagnosis not present

## 2021-08-02 DIAGNOSIS — E785 Hyperlipidemia, unspecified: Secondary | ICD-10-CM | POA: Diagnosis not present

## 2021-08-02 DIAGNOSIS — Z4800 Encounter for change or removal of nonsurgical wound dressing: Secondary | ICD-10-CM | POA: Diagnosis not present

## 2021-08-02 DIAGNOSIS — I480 Paroxysmal atrial fibrillation: Secondary | ICD-10-CM | POA: Diagnosis not present

## 2021-08-02 DIAGNOSIS — R238 Other skin changes: Secondary | ICD-10-CM | POA: Diagnosis not present

## 2021-08-02 DIAGNOSIS — Z9181 History of falling: Secondary | ICD-10-CM | POA: Diagnosis not present

## 2021-08-02 DIAGNOSIS — E039 Hypothyroidism, unspecified: Secondary | ICD-10-CM | POA: Diagnosis not present

## 2021-08-02 DIAGNOSIS — R011 Cardiac murmur, unspecified: Secondary | ICD-10-CM | POA: Diagnosis not present

## 2021-08-02 DIAGNOSIS — I350 Nonrheumatic aortic (valve) stenosis: Secondary | ICD-10-CM | POA: Diagnosis not present

## 2021-08-02 DIAGNOSIS — I130 Hypertensive heart and chronic kidney disease with heart failure and stage 1 through stage 4 chronic kidney disease, or unspecified chronic kidney disease: Secondary | ICD-10-CM | POA: Diagnosis not present

## 2021-08-02 DIAGNOSIS — J449 Chronic obstructive pulmonary disease, unspecified: Secondary | ICD-10-CM | POA: Diagnosis not present

## 2021-08-02 DIAGNOSIS — I5042 Chronic combined systolic (congestive) and diastolic (congestive) heart failure: Secondary | ICD-10-CM | POA: Diagnosis not present

## 2021-08-02 DIAGNOSIS — N184 Chronic kidney disease, stage 4 (severe): Secondary | ICD-10-CM | POA: Diagnosis not present

## 2021-08-02 DIAGNOSIS — Z7901 Long term (current) use of anticoagulants: Secondary | ICD-10-CM | POA: Diagnosis not present

## 2021-08-07 DIAGNOSIS — E039 Hypothyroidism, unspecified: Secondary | ICD-10-CM | POA: Diagnosis not present

## 2021-08-07 DIAGNOSIS — N184 Chronic kidney disease, stage 4 (severe): Secondary | ICD-10-CM | POA: Diagnosis not present

## 2021-08-07 DIAGNOSIS — E785 Hyperlipidemia, unspecified: Secondary | ICD-10-CM | POA: Diagnosis not present

## 2021-08-07 DIAGNOSIS — Z9181 History of falling: Secondary | ICD-10-CM | POA: Diagnosis not present

## 2021-08-07 DIAGNOSIS — I350 Nonrheumatic aortic (valve) stenosis: Secondary | ICD-10-CM | POA: Diagnosis not present

## 2021-08-07 DIAGNOSIS — R011 Cardiac murmur, unspecified: Secondary | ICD-10-CM | POA: Diagnosis not present

## 2021-08-07 DIAGNOSIS — I480 Paroxysmal atrial fibrillation: Secondary | ICD-10-CM | POA: Diagnosis not present

## 2021-08-07 DIAGNOSIS — Z4800 Encounter for change or removal of nonsurgical wound dressing: Secondary | ICD-10-CM | POA: Diagnosis not present

## 2021-08-07 DIAGNOSIS — R238 Other skin changes: Secondary | ICD-10-CM | POA: Diagnosis not present

## 2021-08-07 DIAGNOSIS — I5042 Chronic combined systolic (congestive) and diastolic (congestive) heart failure: Secondary | ICD-10-CM | POA: Diagnosis not present

## 2021-08-07 DIAGNOSIS — J449 Chronic obstructive pulmonary disease, unspecified: Secondary | ICD-10-CM | POA: Diagnosis not present

## 2021-08-07 DIAGNOSIS — I130 Hypertensive heart and chronic kidney disease with heart failure and stage 1 through stage 4 chronic kidney disease, or unspecified chronic kidney disease: Secondary | ICD-10-CM | POA: Diagnosis not present

## 2021-08-07 DIAGNOSIS — Z7901 Long term (current) use of anticoagulants: Secondary | ICD-10-CM | POA: Diagnosis not present

## 2021-08-07 DIAGNOSIS — Z87891 Personal history of nicotine dependence: Secondary | ICD-10-CM | POA: Diagnosis not present

## 2021-08-09 DIAGNOSIS — Z4800 Encounter for change or removal of nonsurgical wound dressing: Secondary | ICD-10-CM | POA: Diagnosis not present

## 2021-08-09 DIAGNOSIS — I480 Paroxysmal atrial fibrillation: Secondary | ICD-10-CM | POA: Diagnosis not present

## 2021-08-09 DIAGNOSIS — N184 Chronic kidney disease, stage 4 (severe): Secondary | ICD-10-CM | POA: Diagnosis not present

## 2021-08-09 DIAGNOSIS — I130 Hypertensive heart and chronic kidney disease with heart failure and stage 1 through stage 4 chronic kidney disease, or unspecified chronic kidney disease: Secondary | ICD-10-CM | POA: Diagnosis not present

## 2021-08-09 DIAGNOSIS — I5042 Chronic combined systolic (congestive) and diastolic (congestive) heart failure: Secondary | ICD-10-CM | POA: Diagnosis not present

## 2021-08-09 DIAGNOSIS — Z9181 History of falling: Secondary | ICD-10-CM | POA: Diagnosis not present

## 2021-08-09 DIAGNOSIS — Z87891 Personal history of nicotine dependence: Secondary | ICD-10-CM | POA: Diagnosis not present

## 2021-08-09 DIAGNOSIS — E039 Hypothyroidism, unspecified: Secondary | ICD-10-CM | POA: Diagnosis not present

## 2021-08-09 DIAGNOSIS — Z7901 Long term (current) use of anticoagulants: Secondary | ICD-10-CM | POA: Diagnosis not present

## 2021-08-09 DIAGNOSIS — E785 Hyperlipidemia, unspecified: Secondary | ICD-10-CM | POA: Diagnosis not present

## 2021-08-09 DIAGNOSIS — R238 Other skin changes: Secondary | ICD-10-CM | POA: Diagnosis not present

## 2021-08-09 DIAGNOSIS — J449 Chronic obstructive pulmonary disease, unspecified: Secondary | ICD-10-CM | POA: Diagnosis not present

## 2021-08-09 DIAGNOSIS — R011 Cardiac murmur, unspecified: Secondary | ICD-10-CM | POA: Diagnosis not present

## 2021-08-09 DIAGNOSIS — I350 Nonrheumatic aortic (valve) stenosis: Secondary | ICD-10-CM | POA: Diagnosis not present

## 2021-08-11 DIAGNOSIS — I509 Heart failure, unspecified: Secondary | ICD-10-CM | POA: Diagnosis not present

## 2021-08-11 DIAGNOSIS — I4891 Unspecified atrial fibrillation: Secondary | ICD-10-CM | POA: Diagnosis not present

## 2021-08-11 DIAGNOSIS — E785 Hyperlipidemia, unspecified: Secondary | ICD-10-CM

## 2021-08-11 DIAGNOSIS — G4733 Obstructive sleep apnea (adult) (pediatric): Secondary | ICD-10-CM | POA: Diagnosis not present

## 2021-08-11 DIAGNOSIS — I110 Hypertensive heart disease with heart failure: Secondary | ICD-10-CM | POA: Diagnosis not present

## 2021-08-14 DIAGNOSIS — Z7689 Persons encountering health services in other specified circumstances: Secondary | ICD-10-CM | POA: Diagnosis not present

## 2021-08-15 DIAGNOSIS — R011 Cardiac murmur, unspecified: Secondary | ICD-10-CM | POA: Diagnosis not present

## 2021-08-15 DIAGNOSIS — Z87891 Personal history of nicotine dependence: Secondary | ICD-10-CM | POA: Diagnosis not present

## 2021-08-15 DIAGNOSIS — I350 Nonrheumatic aortic (valve) stenosis: Secondary | ICD-10-CM | POA: Diagnosis not present

## 2021-08-15 DIAGNOSIS — E785 Hyperlipidemia, unspecified: Secondary | ICD-10-CM | POA: Diagnosis not present

## 2021-08-15 DIAGNOSIS — Z7901 Long term (current) use of anticoagulants: Secondary | ICD-10-CM | POA: Diagnosis not present

## 2021-08-15 DIAGNOSIS — Z9181 History of falling: Secondary | ICD-10-CM | POA: Diagnosis not present

## 2021-08-15 DIAGNOSIS — Z4800 Encounter for change or removal of nonsurgical wound dressing: Secondary | ICD-10-CM | POA: Diagnosis not present

## 2021-08-15 DIAGNOSIS — R238 Other skin changes: Secondary | ICD-10-CM | POA: Diagnosis not present

## 2021-08-15 DIAGNOSIS — E039 Hypothyroidism, unspecified: Secondary | ICD-10-CM | POA: Diagnosis not present

## 2021-08-15 DIAGNOSIS — I130 Hypertensive heart and chronic kidney disease with heart failure and stage 1 through stage 4 chronic kidney disease, or unspecified chronic kidney disease: Secondary | ICD-10-CM | POA: Diagnosis not present

## 2021-08-15 DIAGNOSIS — N184 Chronic kidney disease, stage 4 (severe): Secondary | ICD-10-CM | POA: Diagnosis not present

## 2021-08-15 DIAGNOSIS — I480 Paroxysmal atrial fibrillation: Secondary | ICD-10-CM | POA: Diagnosis not present

## 2021-08-15 DIAGNOSIS — I5042 Chronic combined systolic (congestive) and diastolic (congestive) heart failure: Secondary | ICD-10-CM | POA: Diagnosis not present

## 2021-08-15 DIAGNOSIS — J449 Chronic obstructive pulmonary disease, unspecified: Secondary | ICD-10-CM | POA: Diagnosis not present

## 2021-08-16 DIAGNOSIS — Z9181 History of falling: Secondary | ICD-10-CM | POA: Diagnosis not present

## 2021-08-16 DIAGNOSIS — N184 Chronic kidney disease, stage 4 (severe): Secondary | ICD-10-CM | POA: Diagnosis not present

## 2021-08-16 DIAGNOSIS — Z87891 Personal history of nicotine dependence: Secondary | ICD-10-CM | POA: Diagnosis not present

## 2021-08-16 DIAGNOSIS — E785 Hyperlipidemia, unspecified: Secondary | ICD-10-CM | POA: Diagnosis not present

## 2021-08-16 DIAGNOSIS — Z4800 Encounter for change or removal of nonsurgical wound dressing: Secondary | ICD-10-CM | POA: Diagnosis not present

## 2021-08-16 DIAGNOSIS — R238 Other skin changes: Secondary | ICD-10-CM | POA: Diagnosis not present

## 2021-08-16 DIAGNOSIS — I350 Nonrheumatic aortic (valve) stenosis: Secondary | ICD-10-CM | POA: Diagnosis not present

## 2021-08-16 DIAGNOSIS — R011 Cardiac murmur, unspecified: Secondary | ICD-10-CM | POA: Diagnosis not present

## 2021-08-16 DIAGNOSIS — E039 Hypothyroidism, unspecified: Secondary | ICD-10-CM | POA: Diagnosis not present

## 2021-08-16 DIAGNOSIS — I5042 Chronic combined systolic (congestive) and diastolic (congestive) heart failure: Secondary | ICD-10-CM | POA: Diagnosis not present

## 2021-08-16 DIAGNOSIS — Z7901 Long term (current) use of anticoagulants: Secondary | ICD-10-CM | POA: Diagnosis not present

## 2021-08-16 DIAGNOSIS — I130 Hypertensive heart and chronic kidney disease with heart failure and stage 1 through stage 4 chronic kidney disease, or unspecified chronic kidney disease: Secondary | ICD-10-CM | POA: Diagnosis not present

## 2021-08-16 DIAGNOSIS — I480 Paroxysmal atrial fibrillation: Secondary | ICD-10-CM | POA: Diagnosis not present

## 2021-08-16 DIAGNOSIS — J449 Chronic obstructive pulmonary disease, unspecified: Secondary | ICD-10-CM | POA: Diagnosis not present

## 2021-08-17 DIAGNOSIS — G4733 Obstructive sleep apnea (adult) (pediatric): Secondary | ICD-10-CM | POA: Diagnosis not present

## 2021-08-21 DIAGNOSIS — Z7901 Long term (current) use of anticoagulants: Secondary | ICD-10-CM | POA: Diagnosis not present

## 2021-08-21 DIAGNOSIS — E785 Hyperlipidemia, unspecified: Secondary | ICD-10-CM | POA: Diagnosis not present

## 2021-08-21 DIAGNOSIS — R011 Cardiac murmur, unspecified: Secondary | ICD-10-CM | POA: Diagnosis not present

## 2021-08-21 DIAGNOSIS — I480 Paroxysmal atrial fibrillation: Secondary | ICD-10-CM | POA: Diagnosis not present

## 2021-08-21 DIAGNOSIS — N184 Chronic kidney disease, stage 4 (severe): Secondary | ICD-10-CM | POA: Diagnosis not present

## 2021-08-21 DIAGNOSIS — J449 Chronic obstructive pulmonary disease, unspecified: Secondary | ICD-10-CM | POA: Diagnosis not present

## 2021-08-21 DIAGNOSIS — I130 Hypertensive heart and chronic kidney disease with heart failure and stage 1 through stage 4 chronic kidney disease, or unspecified chronic kidney disease: Secondary | ICD-10-CM | POA: Diagnosis not present

## 2021-08-21 DIAGNOSIS — Z4800 Encounter for change or removal of nonsurgical wound dressing: Secondary | ICD-10-CM | POA: Diagnosis not present

## 2021-08-21 DIAGNOSIS — R238 Other skin changes: Secondary | ICD-10-CM | POA: Diagnosis not present

## 2021-08-21 DIAGNOSIS — Z87891 Personal history of nicotine dependence: Secondary | ICD-10-CM | POA: Diagnosis not present

## 2021-08-21 DIAGNOSIS — I350 Nonrheumatic aortic (valve) stenosis: Secondary | ICD-10-CM | POA: Diagnosis not present

## 2021-08-21 DIAGNOSIS — Z9181 History of falling: Secondary | ICD-10-CM | POA: Diagnosis not present

## 2021-08-21 DIAGNOSIS — I5042 Chronic combined systolic (congestive) and diastolic (congestive) heart failure: Secondary | ICD-10-CM | POA: Diagnosis not present

## 2021-08-21 DIAGNOSIS — E039 Hypothyroidism, unspecified: Secondary | ICD-10-CM | POA: Diagnosis not present

## 2021-08-23 DIAGNOSIS — J449 Chronic obstructive pulmonary disease, unspecified: Secondary | ICD-10-CM | POA: Diagnosis not present

## 2021-08-23 DIAGNOSIS — I480 Paroxysmal atrial fibrillation: Secondary | ICD-10-CM | POA: Diagnosis not present

## 2021-08-23 DIAGNOSIS — R011 Cardiac murmur, unspecified: Secondary | ICD-10-CM | POA: Diagnosis not present

## 2021-08-23 DIAGNOSIS — R238 Other skin changes: Secondary | ICD-10-CM | POA: Diagnosis not present

## 2021-08-23 DIAGNOSIS — I350 Nonrheumatic aortic (valve) stenosis: Secondary | ICD-10-CM | POA: Diagnosis not present

## 2021-08-23 DIAGNOSIS — N184 Chronic kidney disease, stage 4 (severe): Secondary | ICD-10-CM | POA: Diagnosis not present

## 2021-08-23 DIAGNOSIS — E785 Hyperlipidemia, unspecified: Secondary | ICD-10-CM | POA: Diagnosis not present

## 2021-08-23 DIAGNOSIS — Z7901 Long term (current) use of anticoagulants: Secondary | ICD-10-CM | POA: Diagnosis not present

## 2021-08-23 DIAGNOSIS — I130 Hypertensive heart and chronic kidney disease with heart failure and stage 1 through stage 4 chronic kidney disease, or unspecified chronic kidney disease: Secondary | ICD-10-CM | POA: Diagnosis not present

## 2021-08-23 DIAGNOSIS — E039 Hypothyroidism, unspecified: Secondary | ICD-10-CM | POA: Diagnosis not present

## 2021-08-23 DIAGNOSIS — Z9181 History of falling: Secondary | ICD-10-CM | POA: Diagnosis not present

## 2021-08-23 DIAGNOSIS — Z87891 Personal history of nicotine dependence: Secondary | ICD-10-CM | POA: Diagnosis not present

## 2021-08-23 DIAGNOSIS — I5042 Chronic combined systolic (congestive) and diastolic (congestive) heart failure: Secondary | ICD-10-CM | POA: Diagnosis not present

## 2021-08-23 DIAGNOSIS — Z4800 Encounter for change or removal of nonsurgical wound dressing: Secondary | ICD-10-CM | POA: Diagnosis not present

## 2021-08-25 ENCOUNTER — Ambulatory Visit (INDEPENDENT_AMBULATORY_CARE_PROVIDER_SITE_OTHER): Payer: Medicare Other

## 2021-08-25 DIAGNOSIS — I5042 Chronic combined systolic (congestive) and diastolic (congestive) heart failure: Secondary | ICD-10-CM

## 2021-08-25 DIAGNOSIS — L03119 Cellulitis of unspecified part of limb: Secondary | ICD-10-CM

## 2021-08-25 DIAGNOSIS — E785 Hyperlipidemia, unspecified: Secondary | ICD-10-CM

## 2021-08-25 NOTE — Chronic Care Management (AMB) (Signed)
Chronic Care Management   CCM RN Visit Note  08/25/2021 Name: Mary Kennedy MRN: [id number] DOB: 1941/03/30  Subjective: Mary Kennedy is a 81 y.o. year old female who is a primary care patient of Bacigalupo, Marzella Schlein, MD. The care management team was consulted for assistance with disease management and care coordination needs.    Engaged with patient by telephone for follow up visit in response to provider referral for case management and/or care coordination services.   Consent to Services:  The patient was given information about Chronic Care Management services, agreed to services, and gave verbal consent prior to initiation of services.  Please see initial visit note for detailed documentation.   Patient agreed to services and verbal consent obtained.   Assessment: Review of patient past medical history, allergies, medications, health status, including review of consultants reports, laboratory and other test data, was performed as part of comprehensive evaluation and provision of chronic care management services.   SDOH (Social Determinants of Health) assessments and interventions performed:    CCM Care Plan  Allergies  Allergen Reactions   Bactrim [Sulfamethoxazole-Trimethoprim] Rash    Rash with blisters on legs   Lisinopril Anaphylaxis, Swelling and Cough    Close airway   Other Swelling    Persimmon causes mouth and throat swelling   Tegaderm Ag Mesh 2"X2"  [Wound Dressings]     when removing the patch her skin came off.   Wound Dressing Adhesive     Adhesive from covering for PICC line tore skin   Cefazolin In Sodium Chloride Rash   Ciprofloxacin Hives and Rash   Latex Rash    Adhesive from covering for PICC line tore skin   Penicillins Swelling and Rash    Outpatient Encounter Medications as of 08/25/2021  Medication Sig Note   acetaminophen (TYLENOL) 650 MG CR tablet Take 650 mg by mouth every 8 (eight) hours as needed for pain.    albuterol (VENTOLIN  HFA) 108 (90 Base) MCG/ACT inhaler Inhale 2 puffs into the lungs every 6 (six) hours as needed for wheezing or shortness of breath. 07/03/2021: Last filled 06/05/21    amiodarone (PACERONE) 400 MG tablet Take 200 mg by mouth daily in the afternoon. 07/03/2021: 200 mg tabs filled 06/16/21 90 day supply   apixaban (ELIQUIS) 5 MG TABS tablet Take 5 mg by mouth 2 (two) times daily. 07/03/2021: Last filled 06/16/21 90 day supply   atorvastatin (LIPITOR) 20 MG tablet Take 1 tablet (20 mg total) by mouth daily. (Patient taking differently: Take 20 mg by mouth at bedtime.)    Calcium Carb-Cholecalciferol (CALCIUM 600+D) 600-800 MG-UNIT TABS Take 2 tablets by mouth every evening.    Chlorpheniramine Maleate (ALLERGY RELIEF PO) Take by mouth.    Cholecalciferol (VITAMIN D3) 50 MCG (2000 UT) capsule Take 2,000 Units by mouth daily.    diphenhydrAMINE HCl (BENADRYL PO) Take by mouth.    doxycycline (VIBRA-TABS) 100 MG tablet Take 1 tablet (100 mg total) by mouth 2 (two) times daily.    furosemide (LASIX) 20 MG tablet Take 1 tablet (20 mg total) by mouth daily. (Patient taking differently: Take 40 mg by mouth daily.) 07/11/2021: Pt taking 40mg  daily     levothyroxine (SYNTHROID) 125 MCG tablet TAKE 1 TABLET BY MOUTH  DAILY BEFORE BREAKFAST 07/03/2021: Last filled 04/25/21 90 days supply   losartan (COZAAR) 100 MG tablet TAKE 1 TABLET BY MOUTH  DAILY 07/03/2021: Last filled 03/16/21 90 day supply   metoprolol succinate (TOPROL-XL) 50 MG  24 hr tablet Take 0.5 tablets (25 mg total) by mouth daily. 07/03/2021: Last filled 05/04/21 90 day supply   metroNIDAZOLE (METROGEL) 0.75 % gel Apply 1 application topically 2 (two) times daily. 07/03/2021: Filled 06/02/21 90 day supply   MULTIPLE VITAMIN PO Take 1 tablet by mouth daily. Centrum 50 +    Multiple Vitamins-Minerals (OCUVITE ADULT 50+) CAPS Take 1 capsule by mouth daily.    OVER THE COUNTER MEDICATION Take 1 Scoop by mouth daily. Super Beets in glass of water 8 oz    potassium  chloride SA (KLOR-CON) 20 MEQ tablet TAKE 1 TABLET BY MOUTH  DAILY WITH LASIX 07/03/2021: Last filled 02/15/21 90 day supply   triamcinolone (KENALOG) 0.1 % APPLY TOPICALLY TWO TIMES  DAILY    Zinc 50 MG CAPS Take 50 mg by mouth daily.    No facility-administered encounter medications on file as of 08/25/2021.    Patient Active Problem List   Diagnosis Date Noted   CKD (chronic kidney disease) stage 4, GFR 15-29 ml/min (HCC) 07/04/2021   Drug rash 07/04/2021   Aortic stenosis    Cellulitis of lower extremity 07/03/2021   Cellulitis 07/03/2021   Stage 3b chronic kidney disease (HCC) 09/19/2020   Atrial fibrillation (HCC) 10/16/2019   Chronic combined systolic and diastolic heart failure (HCC) 10/16/2019   Swelling of limb 07/21/2019   Benign neoplasm of cecum    Benign neoplasm of ascending colon    Bradycardia 04/25/2015   H/O cardiovascular disorder 12/01/2014   LBP (low back pain) 12/01/2014   Adenopathy 12/01/2014   Adenitis, salivary, recurring 12/01/2014   Absence of bladder continence 12/01/2014   Arthritis of knee, degenerative 09/30/2013   Carotid artery obstruction 04/20/2009   Obesity, Class III, BMI 40-49.9 (morbid obesity) (HCC) 10/19/2008   Family history of cardiovascular disease 07/06/2007   Mitral regurgitation 02/23/2007   History of colonic polyps 02/23/2007   Rosacea 02/23/2007   Menopausal symptom 02/23/2007   Hyperlipidemia 02/19/2007   Adult hypothyroidism 02/19/2007   Patient Care Plan: RN Care Management Plan of Care     Problem Identified: CHF, CKD, A-Fib, HTN, HLD      Long-Range Goal: Disease Progression Prevented or Minimized   Start Date: 07/14/2021  Expected End Date: 10/12/2021  Priority: High  Note:   Current Barriers:  Chronic Disease Management support and education needs related to Atrial Fibrillation, CHF, HTN, HLD, and CKD.  RNCM Clinical Goal(s):  Patient will demonstrate ongoing adherence to prescribed treatment plan for Atrial  Fibrillation, CHF, HTN, HLD, and CKD through collaboration with the provider, RN Care manager and the care team.  Interventions: 1:1 collaboration with primary care provider regarding development and update of comprehensive plan of care as evidenced by provider attestation and co-signature Inter-disciplinary care team collaboration (see longitudinal plan of care) Evaluation of current treatment plan related to  self management and patient's adherence to plan as established by provider   AFIB Interventions: A-Fib action plan reviewed Reviewed importance of adhering to anticoagulants as prescribed Reviewed importance of self-monitoring and associated bleeding risk Advised to complete regular laboratory monitoring as ordered   Heart Failure Interventions:   Wt Readings from Last 3 Encounters:  07/11/21 282 lb 3.2 oz (128 kg)  07/03/21 276 lb 14.4 oz (125.6 kg)  07/03/21 277 lb (125.6 kg)    Discussed current plan for CHF management.  Reviewed information regarding recommended weight parameters. Encouraged to weigh daily and record readings. Encouraged to notify provider for weight gain greater than 3  lbs overnight or weight gain greater than 5 lbs within a week.  Discussed s/sx of fluid overload and indications for notifying a provider. Reports bilateral lower extremity edema. Denies episodes of chest pain or palpitations. Encouraged to assess symptoms daily and contact clinic with concerns as needed. Discussed nutritional intake. Advised to closely monitor sodium consumption and avoid highly processed foods when possible. Reviewed worsening s/sx related to CHF exacerbation and indications for seeking immediate medical attention.  Hyperlipidemia Interventions:   Lab Results  Component Value Date   CHOL 155 06/05/2021   HDL 45 06/05/2021   LDLCALC 96 06/05/2021   TRIG 69 06/05/2021   CHOLHDL 3.4 09/21/2020  Medications reviewed Reviewed provider established cholesterol goals Discussed  importance of completing regular laboratory monitoring as prescribed Reviewed role and benefits of statin for ASCVD risk reduction Discussed strategies to manage statin-induced myalgias. Reports currently tolerating statin well. Reviewed importance of limiting foods high in cholesterol Reviewed exercise goals. Reports activity is currently limited due to bilateral lower extremity leg wraps r/t possible cellulitis. Advised to engage in low impact activities as tolerated.   Hypertension Interventions:  Last practice recorded BP readings:  BP Readings from Last 3 Encounters:  07/11/21 (!) 143/67  07/08/21 (!) 132/53  07/03/21 101/62  Most recent eGFR/CrCl:  Lab Results  Component Value Date   EGFR 44 (L) 09/21/2020    No components found for: CRCL  Reviewed medications and plan for hypertension management. Provided information regarding established blood pressure parameters along with indications for notifying a provider. Reports readings have been in the 140's/50's. Advised to continue monitoring and record readings.  Discussed compliance with recommended cardiac prudent diet. Encouraged to read nutrition labels and avoid highly processed foods when possible. Discussed complications of uncontrolled blood pressure.  Reviewed s/sx of heart attack, stroke and worsening symptoms that require immediate medical attention.  Patient Goals/Self-Care Activities: Take all medications as prescribed Attend all scheduled provider appointments Call pharmacy for medication refills 3-7 days in advance of running out of medications Call provider office for new concerns or questions        PLAN A member of the care management team will follow up within the next two weeks.   [facility] Care Management [facility] [phone]

## 2021-08-30 DIAGNOSIS — Z7901 Long term (current) use of anticoagulants: Secondary | ICD-10-CM | POA: Diagnosis not present

## 2021-08-30 DIAGNOSIS — E039 Hypothyroidism, unspecified: Secondary | ICD-10-CM | POA: Diagnosis not present

## 2021-08-30 DIAGNOSIS — E785 Hyperlipidemia, unspecified: Secondary | ICD-10-CM | POA: Diagnosis not present

## 2021-08-30 DIAGNOSIS — N184 Chronic kidney disease, stage 4 (severe): Secondary | ICD-10-CM | POA: Diagnosis not present

## 2021-08-30 DIAGNOSIS — J449 Chronic obstructive pulmonary disease, unspecified: Secondary | ICD-10-CM | POA: Diagnosis not present

## 2021-08-30 DIAGNOSIS — I130 Hypertensive heart and chronic kidney disease with heart failure and stage 1 through stage 4 chronic kidney disease, or unspecified chronic kidney disease: Secondary | ICD-10-CM | POA: Diagnosis not present

## 2021-08-30 DIAGNOSIS — Z9181 History of falling: Secondary | ICD-10-CM | POA: Diagnosis not present

## 2021-08-30 DIAGNOSIS — Z87891 Personal history of nicotine dependence: Secondary | ICD-10-CM | POA: Diagnosis not present

## 2021-08-30 DIAGNOSIS — Z4800 Encounter for change or removal of nonsurgical wound dressing: Secondary | ICD-10-CM | POA: Diagnosis not present

## 2021-08-30 DIAGNOSIS — R238 Other skin changes: Secondary | ICD-10-CM | POA: Diagnosis not present

## 2021-08-30 DIAGNOSIS — I350 Nonrheumatic aortic (valve) stenosis: Secondary | ICD-10-CM | POA: Diagnosis not present

## 2021-08-30 DIAGNOSIS — R011 Cardiac murmur, unspecified: Secondary | ICD-10-CM | POA: Diagnosis not present

## 2021-08-30 DIAGNOSIS — I5042 Chronic combined systolic (congestive) and diastolic (congestive) heart failure: Secondary | ICD-10-CM | POA: Diagnosis not present

## 2021-08-30 DIAGNOSIS — I480 Paroxysmal atrial fibrillation: Secondary | ICD-10-CM | POA: Diagnosis not present

## 2021-09-06 DIAGNOSIS — J449 Chronic obstructive pulmonary disease, unspecified: Secondary | ICD-10-CM | POA: Diagnosis not present

## 2021-09-06 DIAGNOSIS — Z87891 Personal history of nicotine dependence: Secondary | ICD-10-CM | POA: Diagnosis not present

## 2021-09-06 DIAGNOSIS — Z4800 Encounter for change or removal of nonsurgical wound dressing: Secondary | ICD-10-CM | POA: Diagnosis not present

## 2021-09-06 DIAGNOSIS — Z7901 Long term (current) use of anticoagulants: Secondary | ICD-10-CM | POA: Diagnosis not present

## 2021-09-06 DIAGNOSIS — I130 Hypertensive heart and chronic kidney disease with heart failure and stage 1 through stage 4 chronic kidney disease, or unspecified chronic kidney disease: Secondary | ICD-10-CM | POA: Diagnosis not present

## 2021-09-06 DIAGNOSIS — I350 Nonrheumatic aortic (valve) stenosis: Secondary | ICD-10-CM | POA: Diagnosis not present

## 2021-09-06 DIAGNOSIS — E785 Hyperlipidemia, unspecified: Secondary | ICD-10-CM | POA: Diagnosis not present

## 2021-09-06 DIAGNOSIS — I480 Paroxysmal atrial fibrillation: Secondary | ICD-10-CM | POA: Diagnosis not present

## 2021-09-06 DIAGNOSIS — R238 Other skin changes: Secondary | ICD-10-CM | POA: Diagnosis not present

## 2021-09-06 DIAGNOSIS — Z9181 History of falling: Secondary | ICD-10-CM | POA: Diagnosis not present

## 2021-09-06 DIAGNOSIS — N184 Chronic kidney disease, stage 4 (severe): Secondary | ICD-10-CM | POA: Diagnosis not present

## 2021-09-06 DIAGNOSIS — I5042 Chronic combined systolic (congestive) and diastolic (congestive) heart failure: Secondary | ICD-10-CM | POA: Diagnosis not present

## 2021-09-06 DIAGNOSIS — R011 Cardiac murmur, unspecified: Secondary | ICD-10-CM | POA: Diagnosis not present

## 2021-09-06 DIAGNOSIS — E039 Hypothyroidism, unspecified: Secondary | ICD-10-CM | POA: Diagnosis not present

## 2021-09-10 DIAGNOSIS — G4733 Obstructive sleep apnea (adult) (pediatric): Secondary | ICD-10-CM | POA: Diagnosis not present

## 2021-09-12 DIAGNOSIS — G4733 Obstructive sleep apnea (adult) (pediatric): Secondary | ICD-10-CM | POA: Diagnosis not present

## 2021-09-14 DIAGNOSIS — D508 Other iron deficiency anemias: Secondary | ICD-10-CM | POA: Diagnosis not present

## 2021-09-14 DIAGNOSIS — D631 Anemia in chronic kidney disease: Secondary | ICD-10-CM | POA: Diagnosis not present

## 2021-09-14 DIAGNOSIS — N183 Chronic kidney disease, stage 3 unspecified: Secondary | ICD-10-CM | POA: Diagnosis not present

## 2021-09-14 DIAGNOSIS — I5042 Chronic combined systolic (congestive) and diastolic (congestive) heart failure: Secondary | ICD-10-CM | POA: Diagnosis not present

## 2021-09-14 DIAGNOSIS — I129 Hypertensive chronic kidney disease with stage 1 through stage 4 chronic kidney disease, or unspecified chronic kidney disease: Secondary | ICD-10-CM | POA: Diagnosis not present

## 2021-09-14 DIAGNOSIS — N184 Chronic kidney disease, stage 4 (severe): Secondary | ICD-10-CM | POA: Diagnosis not present

## 2021-09-14 DIAGNOSIS — I4811 Longstanding persistent atrial fibrillation: Secondary | ICD-10-CM | POA: Diagnosis not present

## 2021-09-14 DIAGNOSIS — I10 Essential (primary) hypertension: Secondary | ICD-10-CM | POA: Diagnosis not present

## 2021-09-14 DIAGNOSIS — I340 Nonrheumatic mitral (valve) insufficiency: Secondary | ICD-10-CM | POA: Diagnosis not present

## 2021-09-14 DIAGNOSIS — I3481 Nonrheumatic mitral (valve) annulus calcification: Secondary | ICD-10-CM | POA: Insufficient documentation

## 2021-09-14 DIAGNOSIS — R600 Localized edema: Secondary | ICD-10-CM | POA: Diagnosis not present

## 2021-09-14 DIAGNOSIS — I350 Nonrheumatic aortic (valve) stenosis: Secondary | ICD-10-CM | POA: Diagnosis not present

## 2021-09-14 DIAGNOSIS — M1711 Unilateral primary osteoarthritis, right knee: Secondary | ICD-10-CM | POA: Diagnosis not present

## 2021-09-19 DIAGNOSIS — Z888 Allergy status to other drugs, medicaments and biological substances status: Secondary | ICD-10-CM | POA: Diagnosis not present

## 2021-09-19 DIAGNOSIS — Z882 Allergy status to sulfonamides status: Secondary | ICD-10-CM | POA: Diagnosis not present

## 2021-09-19 DIAGNOSIS — I3481 Nonrheumatic mitral (valve) annulus calcification: Secondary | ICD-10-CM | POA: Diagnosis not present

## 2021-09-19 DIAGNOSIS — I700 Atherosclerosis of aorta: Secondary | ICD-10-CM | POA: Diagnosis not present

## 2021-09-19 DIAGNOSIS — J449 Chronic obstructive pulmonary disease, unspecified: Secondary | ICD-10-CM | POA: Diagnosis not present

## 2021-09-19 DIAGNOSIS — I480 Paroxysmal atrial fibrillation: Secondary | ICD-10-CM | POA: Diagnosis not present

## 2021-09-19 DIAGNOSIS — Z87891 Personal history of nicotine dependence: Secondary | ICD-10-CM | POA: Diagnosis not present

## 2021-09-19 DIAGNOSIS — Z01810 Encounter for preprocedural cardiovascular examination: Secondary | ICD-10-CM | POA: Diagnosis not present

## 2021-09-19 DIAGNOSIS — I10 Essential (primary) hypertension: Secondary | ICD-10-CM | POA: Diagnosis not present

## 2021-09-19 DIAGNOSIS — Z952 Presence of prosthetic heart valve: Secondary | ICD-10-CM | POA: Diagnosis not present

## 2021-09-19 DIAGNOSIS — I2510 Atherosclerotic heart disease of native coronary artery without angina pectoris: Secondary | ICD-10-CM | POA: Diagnosis not present

## 2021-09-19 DIAGNOSIS — N183 Chronic kidney disease, stage 3 unspecified: Secondary | ICD-10-CM | POA: Diagnosis not present

## 2021-09-19 DIAGNOSIS — Z79899 Other long term (current) drug therapy: Secondary | ICD-10-CM | POA: Diagnosis not present

## 2021-09-19 DIAGNOSIS — I350 Nonrheumatic aortic (valve) stenosis: Secondary | ICD-10-CM | POA: Diagnosis not present

## 2021-09-19 DIAGNOSIS — N179 Acute kidney failure, unspecified: Secondary | ICD-10-CM | POA: Diagnosis not present

## 2021-09-19 DIAGNOSIS — I080 Rheumatic disorders of both mitral and aortic valves: Secondary | ICD-10-CM | POA: Diagnosis not present

## 2021-09-19 DIAGNOSIS — E1122 Type 2 diabetes mellitus with diabetic chronic kidney disease: Secondary | ICD-10-CM | POA: Diagnosis not present

## 2021-09-19 DIAGNOSIS — Z881 Allergy status to other antibiotic agents status: Secondary | ICD-10-CM | POA: Diagnosis not present

## 2021-09-19 DIAGNOSIS — I130 Hypertensive heart and chronic kidney disease with heart failure and stage 1 through stage 4 chronic kidney disease, or unspecified chronic kidney disease: Secondary | ICD-10-CM | POA: Diagnosis not present

## 2021-09-19 DIAGNOSIS — Z87892 Personal history of anaphylaxis: Secondary | ICD-10-CM | POA: Diagnosis not present

## 2021-09-19 DIAGNOSIS — I5042 Chronic combined systolic (congestive) and diastolic (congestive) heart failure: Secondary | ICD-10-CM | POA: Diagnosis not present

## 2021-09-19 DIAGNOSIS — Z7901 Long term (current) use of anticoagulants: Secondary | ICD-10-CM | POA: Diagnosis not present

## 2021-09-19 DIAGNOSIS — I4811 Longstanding persistent atrial fibrillation: Secondary | ICD-10-CM | POA: Diagnosis not present

## 2021-09-19 DIAGNOSIS — E039 Hypothyroidism, unspecified: Secondary | ICD-10-CM | POA: Diagnosis not present

## 2021-09-19 DIAGNOSIS — Z880 Allergy status to penicillin: Secondary | ICD-10-CM | POA: Diagnosis not present

## 2021-09-19 DIAGNOSIS — J811 Chronic pulmonary edema: Secondary | ICD-10-CM | POA: Diagnosis not present

## 2021-09-19 DIAGNOSIS — E785 Hyperlipidemia, unspecified: Secondary | ICD-10-CM | POA: Diagnosis not present

## 2021-09-19 DIAGNOSIS — R918 Other nonspecific abnormal finding of lung field: Secondary | ICD-10-CM | POA: Diagnosis not present

## 2021-09-19 DIAGNOSIS — I160 Hypertensive urgency: Secondary | ICD-10-CM | POA: Diagnosis not present

## 2021-09-19 DIAGNOSIS — G4733 Obstructive sleep apnea (adult) (pediatric): Secondary | ICD-10-CM | POA: Diagnosis not present

## 2021-09-19 DIAGNOSIS — D509 Iron deficiency anemia, unspecified: Secondary | ICD-10-CM | POA: Diagnosis not present

## 2021-09-25 ENCOUNTER — Ambulatory Visit (INDEPENDENT_AMBULATORY_CARE_PROVIDER_SITE_OTHER): Payer: Medicare Other | Admitting: Family Medicine

## 2021-09-25 ENCOUNTER — Telehealth: Payer: Self-pay | Admitting: Family Medicine

## 2021-09-25 ENCOUNTER — Encounter: Payer: Self-pay | Admitting: Family Medicine

## 2021-09-25 VITALS — BP 167/73 | HR 64 | Temp 98.7°F | Resp 15 | Wt 271.8 lb

## 2021-09-25 DIAGNOSIS — L03114 Cellulitis of left upper limb: Secondary | ICD-10-CM

## 2021-09-25 DIAGNOSIS — R7989 Other specified abnormal findings of blood chemistry: Secondary | ICD-10-CM

## 2021-09-25 MED ORDER — DOXYCYCLINE HYCLATE 100 MG PO TABS: 100.0000 mg | ORAL_TABLET | Freq: Two times a day (BID) | ORAL | 0 refills | Status: DC

## 2021-09-25 NOTE — Telephone Encounter (Signed)
OK for verbals 

## 2021-09-25 NOTE — Assessment & Plan Note (Signed)
Acute, stable ?D/t venipuncture inpatient ?Will treat with abx given warm to touch and firm ?Previous tolerated doxy well without complications- last took in 2/23  ?

## 2021-09-25 NOTE — Telephone Encounter (Signed)
Randall Hiss advised as below.  ?

## 2021-09-25 NOTE — Assessment & Plan Note (Signed)
Chronic, repeat CMP today ?Has been holding ARB and Lasix ?BP elevated ?+3 pitting LE edema with TED to knees ?+murmur ?Plan for TAVR once stabilized  ?

## 2021-09-25 NOTE — Telephone Encounter (Signed)
Home Health Verbal Orders - Caller/Agency: [facility] ? ?Callback Number: [phone] ? ?Requesting OT/PT/Skilled Nursing/Social Work/Speech  ?Therapy: PT, OT ? ?Frequency: Evaluation  ?

## 2021-09-25 NOTE — Progress Notes (Signed)
?  ? ? ?Established patient visit ? ? ?[name]   DOB: 1940/06/22   81 y.o. Female  MRN: [id number] ?Visit Date: 09/25/2021 ? ?Today's healthcare provider: Gwyneth Sprout, FNP  ?Patient presents for new patient visit to establish care.  Introduced to Designer, jewellery role and practice setting.  All questions answered.  Discussed provider/patient relationship and expectations. ? ? ?[employer] as a Education administrator for Gwyneth Sprout, FNP.,have documented all relevant documentation on the behalf of Gwyneth Sprout, FNP,as directed by  Gwyneth Sprout, FNP while in the presence of Gwyneth Sprout, FNP.  ?Chief Complaint  ?Patient presents with  ? Hospitalization Follow-up  ? ?Subjective  ?  ?HPI  ?Follow up Hospitalization ? ?Patient was admitted to [facility] on 09/19/2021 and discharged on 09/23/2021. ?She was treated for Heart failure in the setting of worsening aortic stenosis. ?Treatment for this included stop Losartan '100mg'$  , start Lasix '40mg'$  and Apixaban 2.'5mg'$   . ?Telephone follow up was done on N/A ?She reports good compliance with treatment. ?She reports this condition is improved. Patient does report swelling in her lowe extremities but states that she has been wearing compression hoses.  ? ?----------------------------------------------------------------------------------------- -  ? ?Medications: ?Outpatient Medications Prior to Visit  ?Medication Sig  ? acetaminophen (TYLENOL) 650 MG CR tablet Take 650 mg by mouth every 8 (eight) hours as needed for pain.  ? albuterol (VENTOLIN HFA) 108 (90 Base) MCG/ACT inhaler Inhale 2 puffs into the lungs every 6 (six) hours as needed for wheezing or shortness of breath.  ? amiodarone (PACERONE) 400 MG tablet Take 200 mg by mouth daily in the afternoon.  ? apixaban (ELIQUIS) 5 MG TABS tablet Take 5 mg by mouth 2 (two) times daily.  ? atorvastatin (LIPITOR) 20 MG tablet Take 1 tablet (20 mg total) by mouth daily. (Patient taking differently: Take 20 mg by mouth  at bedtime.)  ? Calcium Carb-Cholecalciferol (CALCIUM 600+D) 600-800 MG-UNIT TABS Take 2 tablets by mouth every evening.  ? carvedilol (COREG) 3.125 MG tablet Take 3.125 mg by mouth 2 (two) times daily.  ? Chlorpheniramine Maleate (ALLERGY RELIEF PO) Take by mouth.  ? Cholecalciferol (VITAMIN D3) 50 MCG (2000 UT) capsule Take 2,000 Units by mouth daily.  ? diphenhydrAMINE HCl (BENADRYL PO) Take by mouth.  ? furosemide (LASIX) 40 MG tablet Take 40 mg by mouth daily.  ? levothyroxine (SYNTHROID) 125 MCG tablet TAKE 1 TABLET BY MOUTH  DAILY BEFORE BREAKFAST  ? losartan (COZAAR) 100 MG tablet TAKE 1 TABLET BY MOUTH  DAILY  ? metoprolol succinate (TOPROL-XL) 50 MG 24 hr tablet Take 0.5 tablets (25 mg total) by mouth daily.  ? metroNIDAZOLE (METROGEL) 0.75 % gel Apply 1 application topically 2 (two) times daily.  ? MULTIPLE VITAMIN PO Take 1 tablet by mouth daily. Centrum 50 +  ? Multiple Vitamins-Minerals (OCUVITE ADULT 50+) CAPS Take 1 capsule by mouth daily.  ? OVER THE COUNTER MEDICATION Take 1 Scoop by mouth daily. Super Beets in glass of water 8 oz  ? potassium chloride SA (KLOR-CON) 20 MEQ tablet TAKE 1 TABLET BY MOUTH  DAILY WITH LASIX  ? triamcinolone (KENALOG) 0.1 % APPLY TOPICALLY TWO TIMES  DAILY  ? Zinc 50 MG CAPS Take 50 mg by mouth daily.  ? [DISCONTINUED] doxycycline (VIBRA-TABS) 100 MG tablet Take 1 tablet (100 mg total) by mouth 2 (two) times daily.  ? [DISCONTINUED] furosemide (LASIX) 20 MG tablet Take 1 tablet (20 mg total) by mouth daily. (  Patient taking differently: Take 40 mg by mouth daily.)  ? ?No facility-administered medications prior to visit.  ? ? ?Review of Systems ? ? ?  Objective  ?  ?BP (!) 167/73   Pulse 64   Temp 98.7 ?F (37.1 ?C) (Oral)   Resp 15   Wt 271 lb 12.8 oz (123.3 kg)   SpO2 92%   BMI 48.15 kg/m?  ? ? ?Physical Exam ?Vitals and nursing note reviewed.  ?Constitutional:   ?   General: She is not in acute distress. ?   Appearance: Normal appearance. She is obese. She is not  ill-appearing, toxic-appearing or diaphoretic.  ?HENT:  ?   Head: Normocephalic and atraumatic.  ?Cardiovascular:  ?   Rate and Rhythm: Normal rate and regular rhythm.  ?   Pulses: Normal pulses.  ?   Heart sounds: Murmur heard.  ?Systolic murmur is present with a grade of 4/6.  ?  No friction rub. No gallop.  ?Pulmonary:  ?   Effort: Pulmonary effort is normal. No respiratory distress.  ?   Breath sounds: Normal breath sounds. No stridor. No wheezing, rhonchi or rales.  ?Chest:  ?   Chest wall: No tenderness.  ?Abdominal:  ?   General: Bowel sounds are normal.  ?   Palpations: Abdomen is soft.  ?Musculoskeletal:     ?   General: Swelling present. No tenderness, deformity or signs of injury. Normal range of motion.  ?   Right lower leg: 3+ Pitting Edema present.  ?   Left lower leg: 3+ Pitting Edema present.  ?Skin: ?   General: Skin is warm and dry.  ?   Capillary Refill: Capillary refill takes less than 2 seconds.  ?   Coloration: Skin is not jaundiced or pale.  ?   Findings: No bruising, erythema, lesion or rash.  ? ?    ?   Comments: Firm cellulitis s/p venipuncture from hospitalization   ?Neurological:  ?   General: No focal deficit present.  ?   Mental Status: She is alert and oriented to person, place, and time. Mental status is at baseline.  ?   Cranial Nerves: No cranial nerve deficit.  ?   Sensory: No sensory deficit.  ?   Motor: No weakness.  ?   Coordination: Coordination normal.  ?Psychiatric:     ?   Mood and Affect: Mood normal.     ?   Behavior: Behavior normal.     ?   Thought Content: Thought content normal.     ?   Judgment: Judgment normal.  ?  ? ?No results found for any visits on 09/25/21. ? Assessment & Plan  ?  ? ?Problem List Items Addressed This Visit   ? ?  ? Other  ? Cellulitis  ?  Acute, stable ?D/t venipuncture inpatient ?Will treat with abx given warm to touch and firm ?Previous tolerated doxy well without complications- last took in 2/23  ? ?  ?  ? Relevant Medications  ? doxycycline  (VIBRA-TABS) 100 MG tablet  ? Elevated serum creatinine - Primary  ?  Chronic, repeat CMP today ?Has been holding ARB and Lasix ?BP elevated ?+3 pitting LE edema with TED to knees ?+murmur ?Plan for TAVR once stabilized  ? ?  ?  ? Relevant Orders  ? Comprehensive Metabolic Panel (CMET)  ? ? ? ?No follow-ups on file.  ?   ? ?I, Gwyneth Sprout, FNP, have reviewed all documentation for this visit.  The documentation on 09/25/21 for the exam, diagnosis, procedures, and orders are all accurate and complete. ? ? ? ?Gwyneth Sprout, FNP  ?[place] ?[phone] (phone) ?[phone] (fax) ? ?[facility] ?

## 2021-09-26 DIAGNOSIS — I350 Nonrheumatic aortic (valve) stenosis: Secondary | ICD-10-CM | POA: Diagnosis not present

## 2021-09-26 DIAGNOSIS — I5042 Chronic combined systolic (congestive) and diastolic (congestive) heart failure: Secondary | ICD-10-CM | POA: Diagnosis not present

## 2021-09-26 LAB — COMPREHENSIVE METABOLIC PANEL
ALT: 21 IU/L (ref 0–32)
AST: 19 IU/L (ref 0–40)
Albumin/Globulin Ratio: 1.5 (ref 1.2–2.2)
Albumin: 3.9 g/dL (ref 3.7–4.7)
Alkaline Phosphatase: 75 IU/L (ref 44–121)
BUN/Creatinine Ratio: 12 (ref 12–28)
BUN: 15 mg/dL (ref 8–27)
Bilirubin Total: 0.8 mg/dL (ref 0.0–1.2)
CO2: 28 mmol/L (ref 20–29)
Calcium: 9.7 mg/dL (ref 8.7–10.3)
Chloride: 99 mmol/L (ref 96–106)
Creatinine, Ser: 1.22 mg/dL — ABNORMAL HIGH (ref 0.57–1.00)
Globulin, Total: 2.6 g/dL (ref 1.5–4.5)
Glucose: 101 mg/dL — ABNORMAL HIGH (ref 70–99)
Potassium: 4.9 mmol/L (ref 3.5–5.2)
Sodium: 138 mmol/L (ref 134–144)
Total Protein: 6.5 g/dL (ref 6.0–8.5)
eGFR: 45 mL/min/{1.73_m2} — ABNORMAL LOW (ref >59)

## 2021-09-27 ENCOUNTER — Other Ambulatory Visit: Payer: Self-pay | Admitting: Family Medicine

## 2021-09-27 DIAGNOSIS — I130 Hypertensive heart and chronic kidney disease with heart failure and stage 1 through stage 4 chronic kidney disease, or unspecified chronic kidney disease: Secondary | ICD-10-CM | POA: Diagnosis not present

## 2021-09-27 DIAGNOSIS — E785 Hyperlipidemia, unspecified: Secondary | ICD-10-CM | POA: Diagnosis not present

## 2021-09-27 DIAGNOSIS — I480 Paroxysmal atrial fibrillation: Secondary | ICD-10-CM | POA: Diagnosis not present

## 2021-09-27 DIAGNOSIS — E039 Hypothyroidism, unspecified: Secondary | ICD-10-CM | POA: Diagnosis not present

## 2021-09-27 DIAGNOSIS — I5042 Chronic combined systolic (congestive) and diastolic (congestive) heart failure: Secondary | ICD-10-CM

## 2021-09-27 DIAGNOSIS — Z7901 Long term (current) use of anticoagulants: Secondary | ICD-10-CM | POA: Diagnosis not present

## 2021-09-27 DIAGNOSIS — Z9981 Dependence on supplemental oxygen: Secondary | ICD-10-CM | POA: Diagnosis not present

## 2021-09-27 DIAGNOSIS — I10 Essential (primary) hypertension: Secondary | ICD-10-CM | POA: Diagnosis not present

## 2021-09-27 DIAGNOSIS — R918 Other nonspecific abnormal finding of lung field: Secondary | ICD-10-CM | POA: Diagnosis not present

## 2021-09-27 DIAGNOSIS — N183 Chronic kidney disease, stage 3 unspecified: Secondary | ICD-10-CM | POA: Diagnosis not present

## 2021-09-27 DIAGNOSIS — M1990 Unspecified osteoarthritis, unspecified site: Secondary | ICD-10-CM | POA: Diagnosis not present

## 2021-09-27 DIAGNOSIS — J449 Chronic obstructive pulmonary disease, unspecified: Secondary | ICD-10-CM | POA: Diagnosis not present

## 2021-09-27 DIAGNOSIS — I350 Nonrheumatic aortic (valve) stenosis: Secondary | ICD-10-CM | POA: Diagnosis not present

## 2021-09-27 DIAGNOSIS — Z87891 Personal history of nicotine dependence: Secondary | ICD-10-CM | POA: Diagnosis not present

## 2021-09-28 DIAGNOSIS — I350 Nonrheumatic aortic (valve) stenosis: Secondary | ICD-10-CM | POA: Diagnosis not present

## 2021-09-28 DIAGNOSIS — J449 Chronic obstructive pulmonary disease, unspecified: Secondary | ICD-10-CM | POA: Diagnosis not present

## 2021-09-28 DIAGNOSIS — Z87891 Personal history of nicotine dependence: Secondary | ICD-10-CM | POA: Diagnosis not present

## 2021-09-28 DIAGNOSIS — Z7901 Long term (current) use of anticoagulants: Secondary | ICD-10-CM | POA: Diagnosis not present

## 2021-09-28 DIAGNOSIS — E785 Hyperlipidemia, unspecified: Secondary | ICD-10-CM | POA: Diagnosis not present

## 2021-09-28 DIAGNOSIS — I130 Hypertensive heart and chronic kidney disease with heart failure and stage 1 through stage 4 chronic kidney disease, or unspecified chronic kidney disease: Secondary | ICD-10-CM | POA: Diagnosis not present

## 2021-09-28 DIAGNOSIS — Z9981 Dependence on supplemental oxygen: Secondary | ICD-10-CM | POA: Diagnosis not present

## 2021-09-28 DIAGNOSIS — I5042 Chronic combined systolic (congestive) and diastolic (congestive) heart failure: Secondary | ICD-10-CM | POA: Diagnosis not present

## 2021-09-28 DIAGNOSIS — I480 Paroxysmal atrial fibrillation: Secondary | ICD-10-CM | POA: Diagnosis not present

## 2021-09-28 DIAGNOSIS — R918 Other nonspecific abnormal finding of lung field: Secondary | ICD-10-CM | POA: Diagnosis not present

## 2021-09-28 DIAGNOSIS — E039 Hypothyroidism, unspecified: Secondary | ICD-10-CM | POA: Diagnosis not present

## 2021-09-28 DIAGNOSIS — M1990 Unspecified osteoarthritis, unspecified site: Secondary | ICD-10-CM | POA: Diagnosis not present

## 2021-09-28 DIAGNOSIS — N183 Chronic kidney disease, stage 3 unspecified: Secondary | ICD-10-CM | POA: Diagnosis not present

## 2021-10-02 ENCOUNTER — Telehealth: Payer: Self-pay | Admitting: Family Medicine

## 2021-10-02 DIAGNOSIS — I350 Nonrheumatic aortic (valve) stenosis: Secondary | ICD-10-CM | POA: Diagnosis not present

## 2021-10-02 DIAGNOSIS — E785 Hyperlipidemia, unspecified: Secondary | ICD-10-CM | POA: Diagnosis not present

## 2021-10-02 DIAGNOSIS — D509 Iron deficiency anemia, unspecified: Secondary | ICD-10-CM | POA: Diagnosis not present

## 2021-10-02 DIAGNOSIS — Z87891 Personal history of nicotine dependence: Secondary | ICD-10-CM | POA: Diagnosis not present

## 2021-10-02 DIAGNOSIS — I4891 Unspecified atrial fibrillation: Secondary | ICD-10-CM | POA: Diagnosis not present

## 2021-10-02 DIAGNOSIS — N189 Chronic kidney disease, unspecified: Secondary | ICD-10-CM | POA: Diagnosis not present

## 2021-10-02 DIAGNOSIS — E039 Hypothyroidism, unspecified: Secondary | ICD-10-CM | POA: Diagnosis not present

## 2021-10-02 DIAGNOSIS — I129 Hypertensive chronic kidney disease with stage 1 through stage 4 chronic kidney disease, or unspecified chronic kidney disease: Secondary | ICD-10-CM | POA: Diagnosis not present

## 2021-10-02 DIAGNOSIS — J449 Chronic obstructive pulmonary disease, unspecified: Secondary | ICD-10-CM | POA: Diagnosis not present

## 2021-10-02 NOTE — Telephone Encounter (Signed)
Shanon Brow advised as below.

## 2021-10-02 NOTE — Telephone Encounter (Signed)
OK for verbals 

## 2021-10-02 NOTE — Telephone Encounter (Signed)
Shanon Brow PT with [facility] is calling in to request verbal orders to work with pt.    Frequency:   1 week 1 and 2 week 4   Phone: [phone]

## 2021-10-04 DIAGNOSIS — N183 Chronic kidney disease, stage 3 unspecified: Secondary | ICD-10-CM | POA: Diagnosis not present

## 2021-10-04 DIAGNOSIS — Z7901 Long term (current) use of anticoagulants: Secondary | ICD-10-CM | POA: Diagnosis not present

## 2021-10-04 DIAGNOSIS — E039 Hypothyroidism, unspecified: Secondary | ICD-10-CM | POA: Diagnosis not present

## 2021-10-04 DIAGNOSIS — E785 Hyperlipidemia, unspecified: Secondary | ICD-10-CM | POA: Diagnosis not present

## 2021-10-04 DIAGNOSIS — I5042 Chronic combined systolic (congestive) and diastolic (congestive) heart failure: Secondary | ICD-10-CM | POA: Diagnosis not present

## 2021-10-04 DIAGNOSIS — Z87891 Personal history of nicotine dependence: Secondary | ICD-10-CM | POA: Diagnosis not present

## 2021-10-04 DIAGNOSIS — M1990 Unspecified osteoarthritis, unspecified site: Secondary | ICD-10-CM | POA: Diagnosis not present

## 2021-10-04 DIAGNOSIS — I480 Paroxysmal atrial fibrillation: Secondary | ICD-10-CM | POA: Diagnosis not present

## 2021-10-04 DIAGNOSIS — Z9981 Dependence on supplemental oxygen: Secondary | ICD-10-CM | POA: Diagnosis not present

## 2021-10-04 DIAGNOSIS — I350 Nonrheumatic aortic (valve) stenosis: Secondary | ICD-10-CM | POA: Diagnosis not present

## 2021-10-04 DIAGNOSIS — I130 Hypertensive heart and chronic kidney disease with heart failure and stage 1 through stage 4 chronic kidney disease, or unspecified chronic kidney disease: Secondary | ICD-10-CM | POA: Diagnosis not present

## 2021-10-04 DIAGNOSIS — J449 Chronic obstructive pulmonary disease, unspecified: Secondary | ICD-10-CM | POA: Diagnosis not present

## 2021-10-04 DIAGNOSIS — R918 Other nonspecific abnormal finding of lung field: Secondary | ICD-10-CM | POA: Diagnosis not present

## 2021-10-05 DIAGNOSIS — I5042 Chronic combined systolic (congestive) and diastolic (congestive) heart failure: Secondary | ICD-10-CM | POA: Diagnosis not present

## 2021-10-05 DIAGNOSIS — Z9981 Dependence on supplemental oxygen: Secondary | ICD-10-CM | POA: Diagnosis not present

## 2021-10-05 DIAGNOSIS — E785 Hyperlipidemia, unspecified: Secondary | ICD-10-CM | POA: Diagnosis not present

## 2021-10-05 DIAGNOSIS — Z87891 Personal history of nicotine dependence: Secondary | ICD-10-CM | POA: Diagnosis not present

## 2021-10-05 DIAGNOSIS — N183 Chronic kidney disease, stage 3 unspecified: Secondary | ICD-10-CM | POA: Diagnosis not present

## 2021-10-05 DIAGNOSIS — M1990 Unspecified osteoarthritis, unspecified site: Secondary | ICD-10-CM | POA: Diagnosis not present

## 2021-10-05 DIAGNOSIS — I130 Hypertensive heart and chronic kidney disease with heart failure and stage 1 through stage 4 chronic kidney disease, or unspecified chronic kidney disease: Secondary | ICD-10-CM | POA: Diagnosis not present

## 2021-10-05 DIAGNOSIS — I350 Nonrheumatic aortic (valve) stenosis: Secondary | ICD-10-CM | POA: Diagnosis not present

## 2021-10-05 DIAGNOSIS — Z7901 Long term (current) use of anticoagulants: Secondary | ICD-10-CM | POA: Diagnosis not present

## 2021-10-05 DIAGNOSIS — J449 Chronic obstructive pulmonary disease, unspecified: Secondary | ICD-10-CM | POA: Diagnosis not present

## 2021-10-05 DIAGNOSIS — R918 Other nonspecific abnormal finding of lung field: Secondary | ICD-10-CM | POA: Diagnosis not present

## 2021-10-05 DIAGNOSIS — E039 Hypothyroidism, unspecified: Secondary | ICD-10-CM | POA: Diagnosis not present

## 2021-10-05 DIAGNOSIS — I480 Paroxysmal atrial fibrillation: Secondary | ICD-10-CM | POA: Diagnosis not present

## 2021-10-06 ENCOUNTER — Telehealth: Payer: Self-pay

## 2021-10-06 NOTE — Telephone Encounter (Signed)
Copied from [place] [id number]. Topic: General - Other >> Oct 06, 2021  3:26 PM McGill, Nelva Bush wrote: Reason for MYT:RZNB Rankins from [place] in to report a missed PT home health visit was today.

## 2021-10-09 DIAGNOSIS — E039 Hypothyroidism, unspecified: Secondary | ICD-10-CM | POA: Diagnosis not present

## 2021-10-09 DIAGNOSIS — I130 Hypertensive heart and chronic kidney disease with heart failure and stage 1 through stage 4 chronic kidney disease, or unspecified chronic kidney disease: Secondary | ICD-10-CM | POA: Diagnosis not present

## 2021-10-09 DIAGNOSIS — R918 Other nonspecific abnormal finding of lung field: Secondary | ICD-10-CM | POA: Diagnosis not present

## 2021-10-09 DIAGNOSIS — Z7901 Long term (current) use of anticoagulants: Secondary | ICD-10-CM | POA: Diagnosis not present

## 2021-10-09 DIAGNOSIS — I5042 Chronic combined systolic (congestive) and diastolic (congestive) heart failure: Secondary | ICD-10-CM | POA: Diagnosis not present

## 2021-10-09 DIAGNOSIS — J449 Chronic obstructive pulmonary disease, unspecified: Secondary | ICD-10-CM | POA: Diagnosis not present

## 2021-10-09 DIAGNOSIS — N183 Chronic kidney disease, stage 3 unspecified: Secondary | ICD-10-CM | POA: Diagnosis not present

## 2021-10-09 DIAGNOSIS — E785 Hyperlipidemia, unspecified: Secondary | ICD-10-CM | POA: Diagnosis not present

## 2021-10-09 DIAGNOSIS — M1990 Unspecified osteoarthritis, unspecified site: Secondary | ICD-10-CM | POA: Diagnosis not present

## 2021-10-09 DIAGNOSIS — I350 Nonrheumatic aortic (valve) stenosis: Secondary | ICD-10-CM | POA: Diagnosis not present

## 2021-10-09 DIAGNOSIS — I480 Paroxysmal atrial fibrillation: Secondary | ICD-10-CM | POA: Diagnosis not present

## 2021-10-09 DIAGNOSIS — Z9981 Dependence on supplemental oxygen: Secondary | ICD-10-CM | POA: Diagnosis not present

## 2021-10-09 DIAGNOSIS — Z87891 Personal history of nicotine dependence: Secondary | ICD-10-CM | POA: Diagnosis not present

## 2021-10-10 NOTE — Telephone Encounter (Signed)
Noted  

## 2021-10-11 DIAGNOSIS — E785 Hyperlipidemia, unspecified: Secondary | ICD-10-CM | POA: Diagnosis not present

## 2021-10-11 DIAGNOSIS — R918 Other nonspecific abnormal finding of lung field: Secondary | ICD-10-CM | POA: Diagnosis not present

## 2021-10-11 DIAGNOSIS — Z87891 Personal history of nicotine dependence: Secondary | ICD-10-CM | POA: Diagnosis not present

## 2021-10-11 DIAGNOSIS — G4733 Obstructive sleep apnea (adult) (pediatric): Secondary | ICD-10-CM | POA: Diagnosis not present

## 2021-10-11 DIAGNOSIS — E039 Hypothyroidism, unspecified: Secondary | ICD-10-CM | POA: Diagnosis not present

## 2021-10-11 DIAGNOSIS — J449 Chronic obstructive pulmonary disease, unspecified: Secondary | ICD-10-CM | POA: Diagnosis not present

## 2021-10-11 DIAGNOSIS — I350 Nonrheumatic aortic (valve) stenosis: Secondary | ICD-10-CM | POA: Diagnosis not present

## 2021-10-11 DIAGNOSIS — Z7901 Long term (current) use of anticoagulants: Secondary | ICD-10-CM | POA: Diagnosis not present

## 2021-10-11 DIAGNOSIS — M1990 Unspecified osteoarthritis, unspecified site: Secondary | ICD-10-CM | POA: Diagnosis not present

## 2021-10-11 DIAGNOSIS — I130 Hypertensive heart and chronic kidney disease with heart failure and stage 1 through stage 4 chronic kidney disease, or unspecified chronic kidney disease: Secondary | ICD-10-CM | POA: Diagnosis not present

## 2021-10-11 DIAGNOSIS — Z9981 Dependence on supplemental oxygen: Secondary | ICD-10-CM | POA: Diagnosis not present

## 2021-10-11 DIAGNOSIS — I5042 Chronic combined systolic (congestive) and diastolic (congestive) heart failure: Secondary | ICD-10-CM | POA: Diagnosis not present

## 2021-10-11 DIAGNOSIS — I480 Paroxysmal atrial fibrillation: Secondary | ICD-10-CM | POA: Diagnosis not present

## 2021-10-11 DIAGNOSIS — N183 Chronic kidney disease, stage 3 unspecified: Secondary | ICD-10-CM | POA: Diagnosis not present

## 2021-10-13 ENCOUNTER — Other Ambulatory Visit: Payer: Self-pay | Admitting: Family Medicine

## 2021-10-13 DIAGNOSIS — E039 Hypothyroidism, unspecified: Secondary | ICD-10-CM

## 2021-11-23 ENCOUNTER — Telehealth: Payer: Self-pay

## 2021-11-23 NOTE — Telephone Encounter (Signed)
  Care Management   Follow Up Note   11/23/2021 Name: SAPPHIRA HARJO MRN: [id number] DOB: 1940/12/16   Primary Care Provider: Virginia Crews, MD Reason for referral : Chronic Care Management   An unsuccessful telephone outreach was attempted today. The patient was referred to the case management team for assistance with care management and care coordination.    Follow Up Plan:  A HIPAA compliant voice message was left today requesting a return call.   [facility] [phone]

## 2021-11-30 ENCOUNTER — Ambulatory Visit: Payer: Medicare Other | Admitting: Physician Assistant

## 2021-11-30 ENCOUNTER — Ambulatory Visit: Payer: Self-pay

## 2021-11-30 DIAGNOSIS — I5042 Chronic combined systolic (congestive) and diastolic (congestive) heart failure: Secondary | ICD-10-CM

## 2021-11-30 DIAGNOSIS — L03114 Cellulitis of left upper limb: Secondary | ICD-10-CM

## 2021-11-30 NOTE — Chronic Care Management (AMB) (Signed)
   Care Management    RN Visit Note  11/30/2021 Name: [name] MRN: [id number] DOB: 1941/02/28  Subjective: RADA ZEGERS is a 81 y.o. year old female.   Documentation encounter created to complete case transition. All care management goals addressed. The care management team will continue to follow for care coordination needs.   [place] Management [phone]

## 2021-11-30 NOTE — Patient Instructions (Signed)
Thank you for allowing the Care Management team to participate in your care. It was great speaking with you today! We will touch base again on 12/08/21 at 4pm.  Please don't hesitate to call if you require assistance prior to our telephone outreach.  I hope your surgery goes well!!   [facility] Care Management [phone]

## 2021-12-04 DIAGNOSIS — J449 Chronic obstructive pulmonary disease, unspecified: Secondary | ICD-10-CM | POA: Insufficient documentation

## 2021-12-05 ENCOUNTER — Other Ambulatory Visit: Payer: Self-pay | Admitting: Family Medicine

## 2021-12-05 DIAGNOSIS — I5042 Chronic combined systolic (congestive) and diastolic (congestive) heart failure: Secondary | ICD-10-CM

## 2021-12-06 DIAGNOSIS — Z952 Presence of prosthetic heart valve: Secondary | ICD-10-CM | POA: Insufficient documentation

## 2021-12-07 ENCOUNTER — Telehealth: Payer: Self-pay

## 2021-12-07 ENCOUNTER — Other Ambulatory Visit: Payer: Self-pay | Admitting: *Deleted

## 2021-12-07 NOTE — Patient Outreach (Signed)
  Care Coordination TOC Note Transition Care Management Follow-up Telephone Call Date of discharge and from where:  How have you been since you were released from the hospital? Patient states she is sore but otherwise doing well Any questions or concerns? Yes  Items Reviewed: Did the pt receive and understand the discharge instructions provided? Yes  Medications obtained and verified? Yes  Other? No  Any new allergies since your discharge? No  Dietary orders reviewed? Yes Do you have support at home? Yes   Home Care and Equipment/Supplies: Were home health services ordered? no If so, what is the name of the agency? N/A  Has the agency set up a time to come to the patient's home? not applicable Were any new equipment or medical supplies ordered?  No What is the name of the medical supply agency? N/A Were you able to get the supplies/equipment? not applicable Do you have any questions related to the use of the equipment or supplies? No  Functional Questionnaire: (I = Independent and D = Dependent) ADLs: I  Bathing/Dressing- I  Meal Prep- I  Eating- I  Maintaining continence- I  Transferring/Ambulation- I  Managing Meds- I  Follow up appointments reviewed:  PCP Hospital f/u appt confirmed? Yes  Scheduled to see PCP on 12/13/21 @ 1115. [facility] f/u appt confirmed? Yes  Scheduled to see Dr. Ysidro Evert surgeon on 12/26/21 @ 1500. Are transportation arrangements needed? No  If their condition worsens, is the pt aware to call PCP or go to the Emergency Dept.? Yes Was the patient provided with contact information for the PCP's office or ED? Yes Was to pt encouraged to call back with questions or concerns? Yes  SDOH assessments and interventions completed:   Yes  Care Coordination Interventions Activated:  No Care Coordination Interventions:   N/A  Encounter Outcome:  Pt. Visit Completed  Emelia Loron RN, BSN [place] [phone] [email or web address]

## 2021-12-07 NOTE — Telephone Encounter (Signed)
Transition Care Management Unsuccessful Follow-up Telephone Call  Date of discharge and from where:  [facility] 7/26  Attempts:  2nd Attempt  Reason for unsuccessful TCM follow-up call:  No answer/busy

## 2021-12-08 ENCOUNTER — Ambulatory Visit: Payer: Self-pay

## 2021-12-08 NOTE — Patient Instructions (Signed)
Visit Information Thank you for allowing the Care Management team to participate in your care. It was great speaking with you today!   Following are the goals we discussed today:     Our next appointment is by telephone on January 22, 2022 at 4 pm. Please call the care guide team at [phone] if you need to cancel or reschedule your appointment.    The patient verbalized understanding of the information discussed today. Declined need for instructions or additional resources.   A member of the care team will follow up in September.  [place] Management [phone]

## 2021-12-08 NOTE — Patient Outreach (Signed)
  Care Coordination   Initial Visit Note   12/08/2021 Name: Mary Kennedy MRN: [id number] DOB: Jul 21, 1940  Mary Kennedy is a 81 y.o. year old female who sees Leonel Ramsay, MD for primary care. I spoke with  Olam Idler by phone today  What matters to the patients health and wellness today?  Review Medications with the Cardiology team.     SDOH assessments and interventions completed:   Yes SDOH Interventions Today    Flowsheet Row Most Recent Value  SDOH Interventions   Food Insecurity Interventions Intervention Not Indicated  Transportation Interventions Intervention Not Indicated       Care Coordination Interventions Activated:  Yes Care Coordination Interventions:  Yes, provided  Follow up plan: Follow up call scheduled for January 22, 2022.  Encounter Outcome:  Pt. Visit Completed  [place] Management [phone]

## 2021-12-09 ENCOUNTER — Inpatient Hospital Stay
Admission: EM | Admit: 2021-12-09 | Discharge: 2021-12-10 | DRG: 260 | Disposition: A | Discharge status: Other Acute Inpatient Hospital | Payer: Medicare Other | Attending: Pulmonary Disease | Admitting: Pulmonary Disease

## 2021-12-09 ENCOUNTER — Emergency Department: Payer: Medicare Other

## 2021-12-09 ENCOUNTER — Encounter
Admission: EM | Disposition: A | Discharge status: Other Acute Inpatient Hospital | Payer: Self-pay | Source: Home / Self Care | Attending: Pulmonary Disease

## 2021-12-09 DIAGNOSIS — I129 Hypertensive chronic kidney disease with stage 1 through stage 4 chronic kidney disease, or unspecified chronic kidney disease: Secondary | ICD-10-CM | POA: Diagnosis present

## 2021-12-09 DIAGNOSIS — Z961 Presence of intraocular lens: Secondary | ICD-10-CM | POA: Diagnosis present

## 2021-12-09 DIAGNOSIS — Z90710 Acquired absence of both cervix and uterus: Secondary | ICD-10-CM | POA: Diagnosis not present

## 2021-12-09 DIAGNOSIS — Z9079 Acquired absence of other genital organ(s): Secondary | ICD-10-CM

## 2021-12-09 DIAGNOSIS — I959 Hypotension, unspecified: Secondary | ICD-10-CM | POA: Diagnosis present

## 2021-12-09 DIAGNOSIS — I4891 Unspecified atrial fibrillation: Secondary | ICD-10-CM | POA: Diagnosis present

## 2021-12-09 DIAGNOSIS — J449 Chronic obstructive pulmonary disease, unspecified: Secondary | ICD-10-CM | POA: Diagnosis present

## 2021-12-09 DIAGNOSIS — R42 Dizziness and giddiness: Secondary | ICD-10-CM | POA: Diagnosis present

## 2021-12-09 DIAGNOSIS — Z79899 Other long term (current) drug therapy: Secondary | ICD-10-CM

## 2021-12-09 DIAGNOSIS — E669 Obesity, unspecified: Secondary | ICD-10-CM | POA: Diagnosis present

## 2021-12-09 DIAGNOSIS — I469 Cardiac arrest, cause unspecified: Secondary | ICD-10-CM | POA: Diagnosis not present

## 2021-12-09 DIAGNOSIS — Z87891 Personal history of nicotine dependence: Secondary | ICD-10-CM

## 2021-12-09 DIAGNOSIS — Z79890 Hormone replacement therapy (postmenopausal): Secondary | ICD-10-CM

## 2021-12-09 DIAGNOSIS — Z978 Presence of other specified devices: Secondary | ICD-10-CM

## 2021-12-09 DIAGNOSIS — I442 Atrioventricular block, complete: Principal | ICD-10-CM | POA: Diagnosis present

## 2021-12-09 DIAGNOSIS — J9601 Acute respiratory failure with hypoxia: Secondary | ICD-10-CM | POA: Diagnosis present

## 2021-12-09 DIAGNOSIS — I340 Nonrheumatic mitral (valve) insufficiency: Secondary | ICD-10-CM | POA: Diagnosis present

## 2021-12-09 DIAGNOSIS — R778 Other specified abnormalities of plasma proteins: Secondary | ICD-10-CM | POA: Diagnosis present

## 2021-12-09 DIAGNOSIS — R001 Bradycardia, unspecified: Secondary | ICD-10-CM | POA: Diagnosis present

## 2021-12-09 DIAGNOSIS — I890 Lymphedema, not elsewhere classified: Secondary | ICD-10-CM | POA: Diagnosis present

## 2021-12-09 DIAGNOSIS — Z8249 Family history of ischemic heart disease and other diseases of the circulatory system: Secondary | ICD-10-CM

## 2021-12-09 DIAGNOSIS — I452 Bifascicular block: Secondary | ICD-10-CM | POA: Diagnosis present

## 2021-12-09 DIAGNOSIS — Z20822 Contact with and (suspected) exposure to covid-19: Secondary | ICD-10-CM | POA: Diagnosis present

## 2021-12-09 DIAGNOSIS — I480 Paroxysmal atrial fibrillation: Secondary | ICD-10-CM | POA: Diagnosis present

## 2021-12-09 DIAGNOSIS — W19XXXA Unspecified fall, initial encounter: Secondary | ICD-10-CM

## 2021-12-09 DIAGNOSIS — E039 Hypothyroidism, unspecified: Secondary | ICD-10-CM | POA: Diagnosis present

## 2021-12-09 DIAGNOSIS — Z952 Presence of prosthetic heart valve: Secondary | ICD-10-CM | POA: Diagnosis not present

## 2021-12-09 DIAGNOSIS — E785 Hyperlipidemia, unspecified: Secondary | ICD-10-CM | POA: Diagnosis present

## 2021-12-09 DIAGNOSIS — N1832 Chronic kidney disease, stage 3b: Secondary | ICD-10-CM | POA: Diagnosis present

## 2021-12-09 DIAGNOSIS — R7401 Elevation of levels of liver transaminase levels: Secondary | ICD-10-CM | POA: Diagnosis present

## 2021-12-09 DIAGNOSIS — Z7901 Long term (current) use of anticoagulants: Secondary | ICD-10-CM

## 2021-12-09 DIAGNOSIS — Z90722 Acquired absence of ovaries, bilateral: Secondary | ICD-10-CM

## 2021-12-09 DIAGNOSIS — R55 Syncope and collapse: Secondary | ICD-10-CM | POA: Diagnosis present

## 2021-12-09 DIAGNOSIS — W1830XA Fall on same level, unspecified, initial encounter: Secondary | ICD-10-CM | POA: Diagnosis present

## 2021-12-09 DIAGNOSIS — D509 Iron deficiency anemia, unspecified: Secondary | ICD-10-CM | POA: Diagnosis present

## 2021-12-09 HISTORY — PX: TEMPORARY PACEMAKER: CATH118268

## 2021-12-09 LAB — CBG MONITORING, ED: Glucose-Capillary: 120 mg/dL — ABNORMAL HIGH (ref 70–99)

## 2021-12-09 LAB — CBC WITH DIFFERENTIAL/PLATELET
Abs Immature Granulocytes: 0.94 10*3/uL — ABNORMAL HIGH (ref 0.00–0.07)
Basophils Absolute: 0.1 10*3/uL (ref 0.0–0.1)
Basophils Relative: 1 %
Eosinophils Absolute: 0.3 10*3/uL (ref 0.0–0.5)
Eosinophils Relative: 3 %
HCT: 33.2 % — ABNORMAL LOW (ref 36.0–46.0)
Hemoglobin: 10.2 g/dL — ABNORMAL LOW (ref 12.0–15.0)
Immature Granulocytes: 11 %
Lymphocytes Relative: 35 %
Lymphs Abs: 3 10*3/uL (ref 0.7–4.0)
MCH: 30.4 pg (ref 26.0–34.0)
MCHC: 30.7 g/dL (ref 30.0–36.0)
MCV: 98.8 fL (ref 80.0–100.0)
Monocytes Absolute: 1.2 10*3/uL — ABNORMAL HIGH (ref 0.1–1.0)
Monocytes Relative: 14 %
Neutro Abs: 3.1 10*3/uL (ref 1.7–7.7)
Neutrophils Relative %: 36 %
Platelets: 158 10*3/uL (ref 150–400)
RBC: 3.36 MIL/uL — ABNORMAL LOW (ref 3.87–5.11)
RDW: 14.5 % (ref 11.5–15.5)
Smear Review: NORMAL
WBC: 8.6 10*3/uL (ref 4.0–10.5)
nRBC: 0 % (ref 0.0–0.2)

## 2021-12-09 LAB — BLOOD GAS, VENOUS
Acid-Base Excess: 4.7 mmol/L — ABNORMAL HIGH (ref 0.0–2.0)
Bicarbonate: 30.9 mmol/L — ABNORMAL HIGH (ref 20.0–28.0)
FIO2: 35 %
MECHVT: 450 mL
Mechanical Rate: 16
O2 Saturation: 90.1 %
PEEP: 5 cmH2O
Patient temperature: 37
pCO2, Ven: 51 mmHg (ref 44–60)
pH, Ven: 7.39 (ref 7.25–7.43)
pO2, Ven: 60 mmHg — ABNORMAL HIGH (ref 32–45)

## 2021-12-09 LAB — TYPE AND SCREEN
ABO/RH(D): A NEG
Antibody Screen: NEGATIVE

## 2021-12-09 LAB — CBC
HCT: 31.9 % — ABNORMAL LOW (ref 36.0–46.0)
Hemoglobin: 10 g/dL — ABNORMAL LOW (ref 12.0–15.0)
MCH: 31 pg (ref 26.0–34.0)
MCHC: 31.3 g/dL (ref 30.0–36.0)
MCV: 98.8 fL (ref 80.0–100.0)
Platelets: 123 10*3/uL — ABNORMAL LOW (ref 150–400)
RBC: 3.23 MIL/uL — ABNORMAL LOW (ref 3.87–5.11)
RDW: 14.4 % (ref 11.5–15.5)
WBC: 15.6 10*3/uL — ABNORMAL HIGH (ref 4.0–10.5)
nRBC: 0 % (ref 0.0–0.2)

## 2021-12-09 LAB — MAGNESIUM
Magnesium: 2.5 mg/dL — ABNORMAL HIGH (ref 1.7–2.4)
Magnesium: 2.6 mg/dL — ABNORMAL HIGH (ref 1.7–2.4)

## 2021-12-09 LAB — COMPREHENSIVE METABOLIC PANEL
ALT: 33 U/L (ref 0–44)
AST: 47 U/L — ABNORMAL HIGH (ref 15–41)
Albumin: 3.9 g/dL (ref 3.5–5.0)
Alkaline Phosphatase: 68 U/L (ref 38–126)
Anion gap: 9 (ref 5–15)
BUN: 21 mg/dL (ref 8–23)
CO2: 26 mmol/L (ref 22–32)
Calcium: 9.2 mg/dL (ref 8.9–10.3)
Chloride: 105 mmol/L (ref 98–111)
Creatinine, Ser: 1.34 mg/dL — ABNORMAL HIGH (ref 0.44–1.00)
GFR, Estimated: 40 mL/min — ABNORMAL LOW (ref >60)
Glucose, Bld: 154 mg/dL — ABNORMAL HIGH (ref 70–99)
Potassium: 3.8 mmol/L (ref 3.5–5.1)
Sodium: 140 mmol/L (ref 135–145)
Total Bilirubin: 1.4 mg/dL — ABNORMAL HIGH (ref 0.3–1.2)
Total Protein: 7 g/dL (ref 6.5–8.1)

## 2021-12-09 LAB — BASIC METABOLIC PANEL
Anion gap: 7 (ref 5–15)
BUN: 24 mg/dL — ABNORMAL HIGH (ref 8–23)
CO2: 27 mmol/L (ref 22–32)
Calcium: 8.6 mg/dL — ABNORMAL LOW (ref 8.9–10.3)
Chloride: 105 mmol/L (ref 98–111)
Creatinine, Ser: 1.54 mg/dL — ABNORMAL HIGH (ref 0.44–1.00)
GFR, Estimated: 34 mL/min — ABNORMAL LOW (ref >60)
Glucose, Bld: 191 mg/dL — ABNORMAL HIGH (ref 70–99)
Potassium: 3.5 mmol/L (ref 3.5–5.1)
Sodium: 139 mmol/L (ref 135–145)

## 2021-12-09 LAB — BRAIN NATRIURETIC PEPTIDE: B Natriuretic Peptide: 355 pg/mL — ABNORMAL HIGH (ref 0.0–100.0)

## 2021-12-09 LAB — PHOSPHORUS: Phosphorus: 4.1 mg/dL (ref 2.5–4.6)

## 2021-12-09 LAB — GLUCOSE, CAPILLARY: Glucose-Capillary: 182 mg/dL — ABNORMAL HIGH (ref 70–99)

## 2021-12-09 LAB — T4, FREE: Free T4: 1.45 ng/dL — ABNORMAL HIGH (ref 0.61–1.12)

## 2021-12-09 LAB — TROPONIN I (HIGH SENSITIVITY)
Troponin I (High Sensitivity): 46 ng/L — ABNORMAL HIGH (ref <18)
Troponin I (High Sensitivity): 44 ng/L — ABNORMAL HIGH (ref <18)

## 2021-12-09 LAB — APTT: aPTT: 36 seconds (ref 24–36)

## 2021-12-09 LAB — TSH: TSH: 6.937 u[IU]/mL — ABNORMAL HIGH (ref 0.350–4.500)

## 2021-12-09 LAB — PROTIME-INR
INR: 1.3 — ABNORMAL HIGH (ref 0.8–1.2)
Prothrombin Time: 16.4 seconds — ABNORMAL HIGH (ref 11.4–15.2)

## 2021-12-09 LAB — MRSA NEXT GEN BY PCR, NASAL: MRSA by PCR Next Gen: NOT DETECTED

## 2021-12-09 LAB — SARS CORONAVIRUS 2 BY RT PCR: SARS Coronavirus 2 by RT PCR: NEGATIVE

## 2021-12-09 SURGERY — TEMPORARY PACEMAKER

## 2021-12-09 MED ORDER — MIDAZOLAM HCL 2 MG/2ML IJ SOLN: 2.0000 mg | Freq: Once | INTRAMUSCULAR | Status: DC

## 2021-12-09 MED ORDER — POLYETHYLENE GLYCOL 3350 17 G PO PACK: 17.0000 g | PACK | Freq: Every day | ORAL | Status: DC | PRN

## 2021-12-09 MED ORDER — HEPARIN (PORCINE) IN NACL 1000-0.9 UT/500ML-% IV SOLN
INTRAVENOUS | Status: DC | PRN
  Administered 2021-12-09: 500 mL

## 2021-12-09 MED ORDER — SODIUM CHLORIDE 0.9 % IV SOLN
INTRAVENOUS | Status: DC | PRN
  Administered 2021-12-09: 10 mL via INTRAVENOUS

## 2021-12-09 MED ORDER — DOPAMINE-DEXTROSE 3.2-5 MG/ML-% IV SOLN
INTRAVENOUS | Status: AC
  Administered 2021-12-09: 5 ug/kg/min via INTRAVENOUS
  Filled 2021-12-09: qty 250

## 2021-12-09 MED ORDER — DOPAMINE-DEXTROSE 3.2-5 MG/ML-% IV SOLN: 0.0000 ug/kg/min | INTRAVENOUS | Status: DC

## 2021-12-09 MED ORDER — SODIUM CHLORIDE 0.9 % IV SOLN
INTRAVENOUS | Status: AC | PRN
  Administered 2021-12-09: 300 mL via INTRAVENOUS

## 2021-12-09 MED ORDER — PANTOPRAZOLE 2 MG/ML SUSPENSION
40.0000 mg | Freq: Every day | ORAL | Status: DC
  Administered 2021-12-09: 40 mg
  Filled 2021-12-09: qty 20

## 2021-12-09 MED ORDER — DOCUSATE SODIUM 50 MG/5ML PO LIQD
100.0000 mg | Freq: Two times a day (BID) | ORAL | Status: DC
  Administered 2021-12-09: 100 mg
  Filled 2021-12-09: qty 10

## 2021-12-09 MED ORDER — DOBUTAMINE IN D5W 4-5 MG/ML-% IV SOLN
2.5000 ug/kg/min | INTRAVENOUS | Status: DC
  Administered 2021-12-09: 2.5 ug/kg/min via INTRAVENOUS
  Filled 2021-12-09: qty 250

## 2021-12-09 MED ORDER — LIDOCAINE HCL (PF) 1 % IJ SOLN
INTRAMUSCULAR | Status: DC | PRN
  Administered 2021-12-09: 5 mL

## 2021-12-09 MED ORDER — POLYETHYLENE GLYCOL 3350 17 G PO PACK: 17.0000 g | PACK | Freq: Every day | ORAL | Status: DC

## 2021-12-09 MED ORDER — HEPARIN SODIUM (PORCINE) 5000 UNIT/ML IJ SOLN
5000.0000 [IU] | Freq: Three times a day (TID) | INTRAMUSCULAR | Status: DC
  Administered 2021-12-09: 5000 [IU] via SUBCUTANEOUS
  Filled 2021-12-09: qty 1

## 2021-12-09 MED ORDER — FENTANYL CITRATE PF 50 MCG/ML IJ SOSY: 25.0000 ug | PREFILLED_SYRINGE | INTRAMUSCULAR | Status: DC | PRN

## 2021-12-09 MED ORDER — MIDAZOLAM HCL 2 MG/2ML IJ SOLN
INTRAMUSCULAR | Status: DC | PRN
  Administered 2021-12-09: 1 mg via INTRAVENOUS

## 2021-12-09 MED ORDER — MIDAZOLAM HCL 2 MG/2ML IJ SOLN: 1.0000 mg | INTRAMUSCULAR | Status: DC | PRN

## 2021-12-09 MED ORDER — MIDAZOLAM HCL 2 MG/2ML IJ SOLN
INTRAMUSCULAR | Status: AC
  Filled 2021-12-09: qty 2

## 2021-12-09 MED ORDER — GLUCAGON HCL RDNA (DIAGNOSTIC) 1 MG IJ SOLR
1.0000 mg | Freq: Once | INTRAMUSCULAR | Status: AC
  Administered 2021-12-09: 1 mg via INTRAVENOUS
  Filled 2021-12-09: qty 1

## 2021-12-09 MED ORDER — EPINEPHRINE 1 MG/10ML IJ SOSY
PREFILLED_SYRINGE | INTRAMUSCULAR | Status: AC | PRN
  Administered 2021-12-09: 1 mg via INTRAVENOUS

## 2021-12-09 MED ORDER — DOCUSATE SODIUM 50 MG/5ML PO LIQD: 100.0000 mg | Freq: Two times a day (BID) | ORAL | Status: DC | PRN

## 2021-12-09 MED ORDER — EPINEPHRINE 1 MG/10ML IJ SOSY
PREFILLED_SYRINGE | INTRAMUSCULAR | Status: AC | PRN
  Administered 2021-12-09 (×5): 1 mg via INTRAVENOUS

## 2021-12-09 MED ORDER — FENTANYL CITRATE PF 50 MCG/ML IJ SOSY
25.0000 ug | PREFILLED_SYRINGE | INTRAMUSCULAR | Status: DC | PRN
  Administered 2021-12-09: 50 ug via INTRAVENOUS
  Administered 2021-12-09 – 2021-12-10 (×2): 100 ug via INTRAVENOUS
  Filled 2021-12-09 (×2): qty 2
  Filled 2021-12-09: qty 1

## 2021-12-09 MED ORDER — PROPOFOL 1000 MG/100ML IV EMUL
INTRAVENOUS | Status: AC
  Administered 2021-12-09: 10 ug/kg/min
  Filled 2021-12-09: qty 100

## 2021-12-09 MED ORDER — MIDAZOLAM HCL 5 MG/5ML IJ SOLN
INTRAMUSCULAR | Status: AC | PRN
  Administered 2021-12-09: 2 mg via INTRAVENOUS

## 2021-12-09 MED ORDER — LIDOCAINE HCL 1 % IJ SOLN
INTRAMUSCULAR | Status: AC
  Filled 2021-12-09: qty 20

## 2021-12-09 MED ORDER — PROPOFOL 1000 MG/100ML IV EMUL
0.0000 ug/kg/min | INTRAVENOUS | Status: DC
  Administered 2021-12-09: 40 ug/kg/min via INTRAVENOUS
  Filled 2021-12-09 (×2): qty 100

## 2021-12-09 MED ORDER — IOHEXOL 300 MG/ML  SOLN
80.0000 mL | Freq: Once | INTRAMUSCULAR | Status: AC | PRN
  Administered 2021-12-09: 80 mL via INTRAVENOUS

## 2021-12-09 MED ORDER — LEVOTHYROXINE SODIUM 50 MCG PO TABS: 125.0000 ug | ORAL_TABLET | Freq: Every day | ORAL | Status: DC

## 2021-12-09 SURGICAL SUPPLY — 12 items
CABLE ADAPT PACING TEMP 12FT (ADAPTER) ×1 IMPLANT
CATH S G BIP PACING (CATHETERS) ×1 IMPLANT
COVER EZ STRL 42X30 (DRAPES) ×2 IMPLANT
DRAPE BRACHIAL (DRAPES) ×1 IMPLANT
NDL PERC 18GX7CM (NEEDLE) IMPLANT
NEEDLE PERC 18GX7CM (NEEDLE) ×2 IMPLANT
PACK CARDIAC CATH (CUSTOM PROCEDURE TRAY) ×2 IMPLANT
SHEATH AVANTI 6FR X 11CM (SHEATH) ×1 IMPLANT
SLEEVE REPOSITIONING LENGTH 30 (MISCELLANEOUS) ×1 IMPLANT
SUT SILK 0 FSL (SUTURE) ×1 IMPLANT
WIRE GUIDERIGHT .035X150 (WIRE) ×1 IMPLANT
WIRE PACING TEMP ST TIP 5 (CATHETERS) ×1 IMPLANT

## 2021-12-09 NOTE — ED Triage Notes (Signed)
Per EMS report, patient had several syncopal episodes today. Patient had valve replacement this past week. Patient is alert and oriented. Patient had pulse of 43 upon arrival.

## 2021-12-09 NOTE — ED Notes (Signed)
Atropine given in left AC 20g by [facility] RN

## 2021-12-09 NOTE — ED Provider Notes (Addendum)
[facility] Provider Note    Event Date/Time   First MD Initiated Contact with Patient 12/09/21 1221     (approximate)   History   Bradycardia   HPI  Mary Kennedy is a 81 y.o. female with past medical history of HTN, HL, hypothyroid, PAF (on amio/Eliquis), CKD stage 3, obesity, COPD and severe aortic stenosis s/p TAVR 7/25 at [facility] who presents via EMS from home for evaluation after she had a syncopal episode with feeling lightheaded and dizzy.  She reportedly was bradycardic with EMS and syncopized several times in route and transcutaneous pacing was initiated.  On arrival patient states he started feeling lightheaded and dizzy sometime early this morning last night.  He states members waking up on the floor has some pain in her back.  She is intermittently syncopized and halfway through conversation and then provide any additional history.  No other history is made available on presentation.  No medications given by EMS.    Physical Exam  Triage Vital Signs: ED Triage Vitals  Enc Vitals Group     BP 12/09/21 1211 (!) 116/111     Pulse Rate 12/09/21 1211 70     Resp 12/09/21 1215 (!) 51     Temp 12/09/21 1211 97.6 F (36.4 C)     Temp Source 12/09/21 1211 Oral     SpO2 12/09/21 1211 100 %     Weight --      Height --      Head Circumference --      Peak Flow --      Pain Score --      Pain Loc --      Pain Edu? --      Excl. in [place]? --     Most recent vital signs: Vitals:   12/09/21 1335 12/09/21 1340  BP: (!) 206/45 (!) 220/52  Pulse: 65 78  Resp: (!) 21 19  Temp: 97.8 F (36.6 C) 97.7 F (36.5 C)  SpO2: 96% 95%    General: Awake, ill-appearing.  Twice during initial exam patient did syncopized for approximately 30 seconds.  She seemed to become quite rigid during this time. CV:  Good peripheral perfusion.  2+ precautions.  Bradycardic on arrival. Resp:  Normal effort.  Clear bilaterally.  Diminished. Abd:  No distention.   Soft. Other:  Does have some lower extremity edema.  No obvious trauma to extremities or face scalp neck.  There is a tongue laceration noted.   ED Results / Procedures / Treatments  Labs (all labs ordered are listed, but only abnormal results are displayed) Labs Reviewed  CBC WITH DIFFERENTIAL/PLATELET - Abnormal; Notable for the following components:      Result Value   RBC 3.36 (*)    Hemoglobin 10.2 (*)    HCT 33.2 (*)    Monocytes Absolute 1.2 (*)    Abs Immature Granulocytes 0.94 (*)    All other components within normal limits  COMPREHENSIVE METABOLIC PANEL - Abnormal; Notable for the following components:   Glucose, Bld 154 (*)    Creatinine, Ser 1.34 (*)    AST 47 (*)    Total Bilirubin 1.4 (*)    GFR, Estimated 40 (*)    All other components within normal limits  TSH - Abnormal; Notable for the following components:   TSH 6.937 (*)    All other components within normal limits  BRAIN NATRIURETIC PEPTIDE - Abnormal; Notable for the following components:   B  Natriuretic Peptide 355.0 (*)    All other components within normal limits  BLOOD GAS, VENOUS - Abnormal; Notable for the following components:   pO2, Ven 60 (*)    Bicarbonate 30.9 (*)    Acid-Base Excess 4.7 (*)    All other components within normal limits  PROTIME-INR - Abnormal; Notable for the following components:   Prothrombin Time 16.4 (*)    INR 1.3 (*)    All other components within normal limits  TROPONIN I (HIGH SENSITIVITY) - Abnormal; Notable for the following components:   Troponin I (High Sensitivity) 46 (*)    All other components within normal limits  SARS CORONAVIRUS 2 BY RT PCR  APTT  T4, FREE  TYPE AND SCREEN  TYPE AND SCREEN  TYPE AND SCREEN  TROPONIN I (HIGH SENSITIVITY)     EKG  EKG after patient weaned off transcutaneous pacing remarkable for sinus rhythm with a ventricular rate of 76 with evidence of first-degree block and a PR interval of 218, QTc interval of 517 right bundle  branch block and left anterior fascicle block.  Nonspecific ST changes in V2 and aVL without other clear evidence of acute ischemia.  RADIOLOGY  Chest x-ray my interpretation shows ET tube in appropriate position with evidence of cardiomegaly.  No focal consolidation, overt edema, or effusion or other clear acute process.  I reviewed radiology interpretation and agree to findings of NG tube above the GE junction with a tube in appropriate position but no other acute process.  CT head and C-spine my interpretation without evidence of a skull fracture, intracranial hemorrhage or acute C-spine injury.  I reviewed radiology interpretation and agree with their findings of some soft tissue swelling over the right parietal scalp and chronic microvascular ischemic changes and cerebral volume loss as well as evidence of cervical spondylosis and some encroachment with neural foraminal narrowing of C2 on T1.  CT chest abdomen pelvis my interpretation without evidence of rib fracture, focal consolidation, pneumothorax, large pericardial effusion, large PE, or significant visceral hematoma.  I reviewed radiology's interpretation and agree to findings of no clear acute process in the chest abdomen pelvis with evidence of some small scattered linear densities in both lungs suggesting scarring or atelectasis and some edema and subcu air in the right groin region from recent intervention as well as scattered diverticuli but no other acute process.  There is a left renal cyst.   PROCEDURES:  Critical Care performed: No  .Critical Care  Performed by: Lucrezia Starch, MD Authorized by: Lucrezia Starch, MD   Critical care provider statement:    Critical care time (minutes):  90   Critical care was necessary to treat or prevent imminent or life-threatening deterioration of the following conditions:  Cardiac failure   Critical care was time spent personally by me on the following activities:  Development of  treatment plan with patient or surrogate, discussions with consultants, evaluation of patient's response to treatment, examination of patient, ordering and review of laboratory studies, ordering and review of radiographic studies, ordering and performing treatments and interventions, pulse oximetry, re-evaluation of patient's condition and review of old charts Procedure Name: Intubation Date/Time: 12/09/2021 12:35 PM  Performed by: Lucrezia Starch, MDPre-anesthesia Checklist: Emergency Drugs available, Timeout performed, Patient being monitored, Suction available and Patient identified Oxygen Delivery Method: Ambu bag Preoxygenation: Pre-oxygenation with 100% oxygen Induction Type: Rapid sequence Tube size: 7.5 mm Number of attempts: 2 Airway Equipment and Method: Video-laryngoscopy Secured at: 23 cm Dental  Injury: Injury to lip       MEDICATIONS ORDERED IN ED: Medications  propofol (DIPRIVAN) 1000 MG/100ML infusion (15 mcg/kg/min  123.1 kg Intravenous Rate/Dose Change 12/09/21 1307)  DOBUTamine (DOBUTREX) infusion 4000 mcg/mL (2.5 mcg/kg/min  123.1 kg Intravenous New Bag/Given 12/09/21 1300)  0.9 %  sodium chloride infusion (10 mLs Intravenous New Bag/Given 12/09/21 1333)  glucagon (human recombinant) (GLUCAGEN) injection 1 mg (1 mg Intravenous Given 12/09/21 1303)  iohexol (OMNIPAQUE) 300 MG/ML solution 80 mL (80 mLs Intravenous Contrast Given 12/09/21 1351)     IMPRESSION / MDM / ASSESSMENT AND PLAN / ED COURSE  I reviewed the triage vital signs and the nursing notes. Patient's presentation is most consistent with acute presentation with potential threat to life or bodily function.                               Differential diagnosis includes, but is not limited to syncope and fall I suspect related to her bradycardia possibly secondary to beta-blocker toxicity as she is on metoprolol, ischemia, metabolic derangements, hypothyroidism.  On arrival she was immediately moved from EMS  stretcher to ED bed and in EMS transcutaneous pacing was turned off.  Patient was immediately placed on ED pacer pads and attempted to palpate a pulse was able to do so.  Patient went unconscious.  1 mg of atropine immediately given with slight improvement in heart rate to the 50s and patient woke up.  However she again syncopized twice more each time lasting less than 15 to 20 seconds.  Subsequently transcutaneous pacing initiated at a rate of 70. She was subsequently intubated for airway protection.  Given she did reportedly fall before this happened will obtain full trauma scans  EKG after patient weaned off transcutaneous pacing remarkable for sinus rhythm with a ventricular rate of 76 with evidence of first-degree block and a PR interval of 218, QTc interval of 517 right bundle branch block and left anterior fascicle block.  Nonspecific ST changes in V2 and aVL without other clear evidence of acute ischemia.  CMP remarkable for glucose of 154, creatinine of 1.34 compared to 1.225 months ago, AST of 47 and a T. bili of 1.4 without any other significant electrolyte or metabolic derangements.  CBC without leukocytosis and hemoglobin of 10.2 compared to 11.35 months ago.  Normal platelets.  TSH elevated 6.9.  Free T4 sent.  Initial troponin slightly elevated at 46.  BNP 355.  VBG with a pH of 7.39 with PCO2 of 81 and bicarb of 30.9.  INR 1.3.  PTT 36.  Chest x-ray my interpretation shows ET tube in appropriate position with evidence of cardiomegaly.  No focal consolidation, overt edema, or effusion or other clear acute process.  I reviewed radiology interpretation and agree to findings of NG tube above the GE junction with a tube in appropriate position but no other acute process.  CT head and C-spine my interpretation without evidence of a skull fracture, intracranial hemorrhage or acute C-spine injury.  I reviewed radiology interpretation and agree with their findings of some soft tissue swelling over  the right parietal scalp and chronic microvascular ischemic changes and cerebral volume loss as well as evidence of cervical spondylosis and some encroachment with neural foraminal narrowing of C2 on T1.  CT chest abdomen pelvis my interpretation without evidence of rib fracture, focal consolidation, pneumothorax, large pericardial effusion, large PE, or significant visceral hematoma.  I reviewed radiology's interpretation  and agree to findings of no clear acute process in the chest abdomen pelvis with evidence of some small scattered linear densities in both lungs suggesting scarring or atelectasis and some edema and subcu air in the right groin region from recent intervention as well as scattered diverticuli but no other acute process.  There is a left renal cyst.   I did consult with on-call cardiologist Dr. Saralyn Pilar who came and evaluated the patient.  I was able to successfully wean patient off transcutaneous pacer after starting dobutamine drip.  This is a fairly low dose and we will see if we can continue to wean this down.    Per Dr. Saralyn Pilar no indication for transvenous picker at this time.  I discussed possible admission to ICU.  [place] with on-call intensivist Dr. Patsey Berthold recommends reaching out to [facility] for possible transfer as this could be a complication of patient's recent TAVR.   I did speak with Dr. Arbie Cookey who accepts patient for transfer on behalf of Dr Gilles Chiquito.   Care patient signed over to assuming provider at approximately 1600 with patient pending transfer at time of signout.   FINAL CLINICAL IMPRESSION(S) / ED DIAGNOSES   Final diagnoses:  Syncope, unspecified syncope type  Bradycardia  Fall, initial encounter  Troponin I above reference range     Rx / DC Orders   ED Discharge Orders     None        Note:  This document was prepared using Dragon voice recognition software and may include unintentional dictation errors.   Lucrezia Starch, MD 12/09/21 1454     Lucrezia Starch, MD 12/09/21 [phone]

## 2021-12-09 NOTE — ED Notes (Signed)
$'100mg'n$  Rocuronium given via Left AC by Marolyn Hammock RN

## 2021-12-09 NOTE — ED Notes (Signed)
Dr. Tamala Julian at bedside, RT at bedside, Rand, this writer, Marolyn Hammock RN at bedside. Patient is being paced by code cart.

## 2021-12-09 NOTE — Consult Note (Signed)
[facility] Cardiology  CARDIOLOGY CONSULT NOTE  Patient ID: DELCIA SPITZLEY MRN: [id number] DOB/AGE: 80-May-1942 80 y.o.  Admit date: 12/09/2021 Referring Physician Tamala Julian Primary Physician [facility] Primary Cardiologist [facility] Reason for Consultation syncope  HPI: 81 year old female referred for evaluation of syncope.  Patient is status post TAVR 12/05/2021 at [facility].  Postprocedural echocardiogram 12/06/2021 revealed normal left ventricular function, with LVEF grade 55%.  Earlier today, the patient experienced multiple syncopal episodes and was brought to [facility] ED via EMS.  Upon arrival the patient was noted to be bradycardic heart rate of 43 bpm.  No initial ECG or telemetry strips were available to review.  Patient had recurrent syncope in the emergency room, needed with atropine, [place] Zant transcutaneous pacing, and the patient was intubated to protect her airway.  Subsequently, the patient has clinically stabilized, has remained in sinus rhythm.  ECG reveals sinus rhythm at 76 bpm with right bundle branch block and left anterior fascicular block.  Review of systems complete and found to be negative unless listed above     Past Medical History:  Diagnosis Date   Allergy    Aortic stenosis    Atrial fibrillation (HCC)    Atypical squamous cells of undetermined significance on cytologic smear of vagina (ASC-US) 12/01/2014   s/p gynecology consultation 07/2010; s/p repeat pap smear with HPV.    Cellulitis    Heart murmur    Hyperlipidemia    Hypertension    Hypothyroidism    Thyroid disease    Wears dentures    partial lower, full upper    Past Surgical History:  Procedure Laterality Date   ABDOMINAL HYSTERECTOMY  1961   BIL Oophorectomy   APPENDECTOMY  at age 87   [place] N/A 01/18/2021   Procedure: CARDIOVERSION;  Surgeon: Yolonda Kida, MD;  Location: [place] [facility];  Service: Cardiovascular;  Laterality: N/A;    CARDIOVERSION N/A 03/23/2021   Procedure: CARDIOVERSION;  Surgeon: Corey Skains, MD;  Location: [facility];  Service: Cardiovascular;  Laterality: N/A;   CATARACT EXTRACTION W/PHACO Left 09/01/2019   Procedure: CATARACT EXTRACTION PHACO AND INTRAOCULAR LENS PLACEMENT ([place]) LEFT 8.84 00:49.4;  Surgeon: Birder Robson, MD;  Location: [place];  Service: Ophthalmology;  Laterality: Left;   CATARACT EXTRACTION W/PHACO Right 12/22/2019   Procedure: CATARACT EXTRACTION PHACO AND INTRAOCULAR LENS PLACEMENT (I[facility]) RIGHT 6.77 00:42.9;  Surgeon: Birder Robson, MD;  Location: [place];  Service: Ophthalmology;  Laterality: Right;   COLONOSCOPY WITH PROPOFOL N/A 08/27/2017   Procedure: COLONOSCOPY WITH PROPOFOL;  Surgeon: Lucilla Lame, MD;  Location[facility];  Service: Endoscopy;  Laterality: N/A;   FOOT SURGERY Bilateral    RIGHT/LEFT HEART CATH AND CORONARY ANGIOGRAPHY N/A 04/14/2021   Procedure: RIGHT/LEFT HEART CATH AND CORONARY ANGIOGRAPHY;  Surgeon: Yolonda Kida, MD;  Location: [place] [facility];  Service: Cardiovascular;  Laterality: N/A;   TEE WITHOUT CARDIOVERSION N/A 03/23/2021   Procedure: TRANSESOPHAGEAL ECHOCARDIOGRAM (TEE);  Surgeon: Corey Skains, MD;  Location: [facility];  Service: Cardiovascular;  Laterality: N/A;   TONSILLECTOMY  at age 70    (Not in a hospital admission)  Social History   Socioeconomic History   Marital status: Divorced    Spouse name: Not on file   Number of children: 2   Years of education: Not on file   Highest education level: Some college, no degree  Occupational History   Occupation: Retired  Comment: working part-time  Tobacco Use   Smoking status: Former    Types: Cigarettes    Quit date: 05/13/1996    Years since quitting: 25.5   Smokeless tobacco: Never  Vaping Use   Vaping Use: Never used  Substance and Sexual Activity   Alcohol use: Not Currently    Alcohol/week: 0.0 - 2.0 standard drinks of  alcohol   Drug use: No   Sexual activity: Not on file  Other Topics Concern   Not on file  Social History Narrative   Lives by herself.    Social Determinants of Health   Financial Resource Strain: Low Risk  (07/02/2019)   Overall Financial Resource Strain (CARDIA)    Difficulty of Paying Living Expenses: Not hard at all  Food Insecurity: No Food Insecurity (12/08/2021)   Hunger Vital Sign    Worried About Running Out of Food in the Last Year: Never true    Ran Out of Food in the Last Year: Never true  Transportation Needs: No Transportation Needs (12/08/2021)   PRAPARE - Hydrologist (Medical): No    Lack of Transportation (Non-Medical): No  Physical Activity: Inactive (07/02/2019)   Exercise Vital Sign    Days of Exercise per Week: 0 days    Minutes of Exercise per Session: 0 min  Stress: Stress Concern Present (07/02/2019)   [place]    Feeling of Stress : To some extent  Social Connections: Moderately Isolated (07/02/2019)   Social Connection and Isolation Panel [NHANES]    Frequency of Communication with Friends and Family: More than three times a week    Frequency of Social Gatherings with Friends and Family: Once a week    Attends Religious Services: More than 4 times per year    Active Member of [employer] or Organizations: No    Attends Archivist Meetings: Never    Marital Status: Divorced  Human resources officer Violence: Not At Risk (07/02/2019)   Humiliation, Afraid, Rape, and Kick questionnaire    Fear of Current or Ex-Partner: No    Emotionally Abused: No    Physically Abused: No    Sexually Abused: No    Family History  Problem Relation Age of Onset   Pancreatitis Mother    Drug abuse Brother        PTSD   Cancer Brother        pancreas lesion   Colon cancer Brother    Heart attack Father    Lung cancer Brother    Cancer Brother        lung cancer   Breast  cancer Cousin        2nd cousin      Review of systems complete and found to be negative unless listed above      PHYSICAL EXAM  General: Well developed, well nourished, in no acute distress HEENT:  Normocephalic and atramatic Neck:  No JVD.  Lungs: Clear bilaterally to auscultation and percussion. Heart: HRRR . Normal S1 and S2 without gallops or murmurs.  Abdomen: Bowel sounds are positive, abdomen soft and non-tender  Msk:  Back normal, normal gait. Normal strength and tone for age. Extremities: No clubbing, cyanosis or edema.   Neuro: Alert and oriented X 3. Psych:  Good affect, responds appropriately  Labs:   Lab Results  Component Value Date   WBC 8.6 12/09/2021   HGB 10.2 (L) 12/09/2021   HCT 33.2 (L) 12/09/2021  MCV 98.8 12/09/2021   PLT 158 12/09/2021    Recent Labs  Lab 12/09/21 1224  NA 140  K 3.8  CL 105  CO2 26  BUN 21  CREATININE 1.34*  CALCIUM 9.2  PROT 7.0  BILITOT 1.4*  ALKPHOS 68  ALT 33  AST 47*  GLUCOSE 154*   Lab Results  Component Value Date   TROPONINI <0.03 12/26/2017    Lab Results  Component Value Date   CHOL 155 06/05/2021   CHOL 157 09/21/2020   CHOL 163 10/19/2019   Lab Results  Component Value Date   HDL 45 06/05/2021   HDL 46 09/21/2020   HDL 41 10/19/2019   Lab Results  Component Value Date   LDLCALC 96 06/05/2021   LDLCALC 95 09/21/2020   LDLCALC 102 (H) 10/19/2019   Lab Results  Component Value Date   TRIG 69 06/05/2021   TRIG 85 09/21/2020   TRIG 108 10/19/2019   Lab Results  Component Value Date   CHOLHDL 3.4 09/21/2020   CHOLHDL 4.0 10/19/2019   CHOLHDL 3.9 06/23/2018   No results found for: "LDLDIRECT"    Radiology: CT CHEST ABDOMEN PELVIS W CONTRAST  Result Date: 12/09/2021 CLINICAL DATA:  Trauma EXAM: CT CHEST, ABDOMEN, AND PELVIS WITH CONTRAST TECHNIQUE: Multidetector CT imaging of the chest, abdomen and pelvis was performed following the standard protocol during bolus administration of  intravenous contrast. RADIATION DOSE REDUCTION: This exam was performed according to the departmental dose-optimization program which includes automated exposure control, adjustment of the mA and/or kV according to patient size and/or use of iterative reconstruction technique. CONTRAST:  20m OMNIPAQUE IOHEXOL 300 MG/ML  SOLN COMPARISON:  Chest radiographs including the study done earlier today FINDINGS: CT CHEST FINDINGS Cardiovascular: There are scattered arterial calcifications There is metallic stent/valve prosthesis in the root of thoracic aorta. Dense calcification is seen in the mitral annulus. There is homogeneous enhancement in thoracic aorta. There are no intraluminal filling defects in central pulmonary artery branches. Mediastinum/Nodes: There is no evidence of mediastinal hematoma or significant lymphadenopathy. Tip of endotracheal tube is at the level of aortic arch. Enteric tube is noted traversing the thoracic esophagus. Lungs/Pleura: There is no focal pulmonary consolidation. There are scattered linear densities in both lungs, more so in the lower lung fields. Faint ground-glass densities are seen in the parahilar regions. Breathing motion limits evaluation of lung fields. There is no pleural effusion or pneumothorax. Musculoskeletal: No displaced fractures are seen. CT ABDOMEN PELVIS FINDINGS Hepatobiliary: No focal abnormalities are seen in liver. There is no dilation of bile ducts. Gallbladder is unremarkable. Pancreas: No focal abnormalities are seen. Spleen: There are calcified granulomas in spleen. There is no demonstrable focal laceration. Adrenals/Urinary Tract: Adrenals are unremarkable. There is no hydronephrosis. There is no perinephric fluid collection. There is 4.1 cm smoothly marginated fluid density lesion in the mid to lower portion of left kidney, possibly a cyst. There are few other subcentimeter low-density foci in the lower pole of left kidney which are too small to characterize.  Urinary bladder is not distended. Foley catheter is seen in the bladder. Stomach/Bowel: Tip of NG tube is seen in the stomach. Small bowel loops are not dilated. Appendix is not seen. There is no significant wall thickening and colon. Few diverticula are seen in colon without signs of focal diverticulitis. Vascular/Lymphatic: Scattered arterial calcifications are seen. There is no evidence of retroperitoneal hematoma. There is edema in subcutaneous plane in the right inguinal region along with pockets of  air and surgical clips. These findings may be due to recent intervention or infectious process. Reproductive: Uterus is not seen. Other: There is no ascites or pneumoperitoneum. There is subcutaneous edema in suprapubic region, possibly contusion or cellulitis. Musculoskeletal: Degenerative changes are noted in lumbar spine. No recent fracture is seen. IMPRESSION: No significant acute findings are seen in chest, abdomen and pelvis. There is no evidence of mediastinal or retroperitoneal hematoma. Major vascular structures appear intact. There is no laceration in solid organs. There is no bowel wall thickening. There is no ascites or pneumoperitoneum. Small scattered linear densities in both lungs may suggest scarring or subsegmental atelectasis. There is no focal pulmonary consolidation. There is no pleural effusion. There is edema and pockets of air in subcutaneous plane in the right inguinal region, possibly due to recent intervention or suggest infectious process. Scattered diverticula are seen in colon without signs of focal diverticulitis. Left renal cyst. Other findings as described in the body of the report. Electronically Signed   By: Elmer Picker M.D.   On: 12/09/2021 14:42   CT Cervical Spine Wo Contrast  Result Date: 12/09/2021 CLINICAL DATA:  Trauma EXAM: CT CERVICAL SPINE WITHOUT CONTRAST TECHNIQUE: Multidetector CT imaging of the cervical spine was performed without intravenous contrast.  Multiplanar CT image reconstructions were also generated. RADIATION DOSE REDUCTION: This exam was performed according to the departmental dose-optimization program which includes automated exposure control, adjustment of the mA and/or kV according to patient size and/or use of iterative reconstruction technique. COMPARISON:  None Available. FINDINGS: Alignment: Alignment of the posterior margins of vertebral bodies is unremarkable. Skull base and vertebrae: No recent fracture is seen. Degenerative changes are noted. Soft tissues and spinal canal: There is no central spinal stenosis. Disc levels: There is encroachment of neural foramina from C2-T1 levels. Upper chest: Scarring is seen in both apices. Other: Diet arteries difficult to visualize suggesting atrophy. IMPRESSION: No recent fracture is seen. Cervical spondylosis with encroachment of neural foramina from C2-T1 levels. Electronically Signed   By: Elmer Picker M.D.   On: 12/09/2021 14:21   CT HEAD WO CONTRAST (5MM)  Result Date: 12/09/2021 CLINICAL DATA:  Head trauma, minor (Age >= 65y) EXAM: CT HEAD WITHOUT CONTRAST TECHNIQUE: Contiguous axial images were obtained from the base of the skull through the vertex without intravenous contrast. RADIATION DOSE REDUCTION: This exam was performed according to the departmental dose-optimization program which includes automated exposure control, adjustment of the mA and/or kV according to patient size and/or use of iterative reconstruction technique. COMPARISON:  None Available. FINDINGS: Brain: No evidence of acute infarction, hemorrhage, hydrocephalus, extra-axial collection or mass lesion/mass effect. Scattered low-density changes within the periventricular and subcortical white matter compatible with chronic microvascular ischemic change. Mild diffuse cerebral volume loss. Vascular: No hyperdense vessel or unexpected calcification. Skull: Normal. Negative for fracture or focal lesion. Sinuses/Orbits:  Layering fluid within the posterior nasopharynx. Paranasal sinuses and mastoid air cells are clear. Other: Soft tissue swelling overlies the right parietal scalp. IMPRESSION: 1. No acute intracranial abnormality. 2. Soft tissue swelling overlies the right parietal scalp. No underlying calvarial fracture. 3. Chronic microvascular ischemic change and cerebral volume loss. Electronically Signed   By: Davina Poke D.O.   On: 12/09/2021 14:21   DG Chest Portable 1 View  Result Date: 12/09/2021 CLINICAL DATA:  Intubation.  NG tube placement. EXAM: PORTABLE CHEST 1 VIEW COMPARISON:  December 26, 2017 FINDINGS: A transcutaneous pacer lead obscures part of the lower left chest. An ETT terminates in mid  trachea, in good position. The side port of the NG tube is 3.6 cm above the location of the GE junction. The distal tip is in the stomach. The heart, hila, mediastinum, lungs, and pleura are otherwise unremarkable given portable technique. IMPRESSION: 1. The side port of the NG tube terminates above the GE junction. Recommend advancing approximately 5 cm. 2. The ETT is in good position. 3. No other significant abnormalities identified on today's limited portable chest. Electronically Signed   By: Dorise Bullion III M.D.   On: 12/09/2021 13:09    EKG: Sinus rhythm at 76 bpm with right bundle branch block and left anterior fascicular block  ASSESSMENT AND PLAN:   1.  Syncope, likely secondary to bradycardia, possible AV block, in the setting of recent TAVR 12/05/2021, currently in sinus rhythm, hemodynamically stable, intubated to protect her airway. 2.  TAVR 12/05/2021, echocardiogram 12/06/2021 revealed normal left ventricular function, with LVEF greater than 55%. 3.  Normal coronary anatomy by cardiac catheterization 04/14/2021  Recommendations  1.  Agree with current therapy 2.  No current indication for transvenous temporary pacemaker.  If patient develops recurrent hemodynamically unstable bradycardia then  will proceed with temporary pacemaker. 3.  Leave transcutaneous temporary pacer pads on 4.  Strongly consider permanent pacemaker, Micra AV versus traditional dual-chamber pacemaker, prior to discharge.  Will discuss further with patient when awake.   Signed: Isaias Cowman MD,PhD, [facility] 12/09/2021, 2:46 PM

## 2021-12-09 NOTE — H&P (Signed)
NAME:  Mary Kennedy, MRN:  [id number], DOB:  05-24-1940, LOS: 0 ADMISSION DATE:  12/09/2021, CONSULTATION DATE: 12/09/2021 REFERRING MD: Dr. Tamala Julian, CHIEF COMPLAINT: Syncopal Episodes    History of Present Illness:  This is an 81 yo female with hx of severe aortic stenosis and underwent a TAVR at [facility] on 12/05/21 and discharged on 12/06/21 without evidence of complications.  She presented to [facility] ER on 07/29 via EMS with several syncopal episodes today 7/29. Pt also c/o lightheadedness/dizziness onset last night. She also reported waking up on the floor with some back pain.  EMS reported pt bradycardic upon their arrival and en route to the ER pt had several syncopal episodes.  Therefore, pt transcutaneous pacing initiated.   ED Course Upon arrival to the ER pt alert/oriented, however bradycardic hr 43 with intermittent syncopal episodes.  Pt required continued transcutaneous pacing and mechanical intubation. Dobutamine drip started and she was subsequently weaned off TC pacing with EKG showing NSR at 76 with evidence of 1st degree HB, LAFB, RBBB and prolonged Qtc. CT imaging results below. EDP spoke with on-call Cardiologist Dr. Saralyn Pilar & PCCM and the decision was made to transfer the patient to [place] where her TAVR procedure had been completed, now she was stable off TC pacing. Patient accepted for transfer to [place], upon [facility] flight transfer team arrival patient became asystolic losing pulses x 4 different events requiring multiple rounds of ACLS, each episode totaling 5-8 minutes of downtime per ED report. Due to instability, on-call cath team activated for emergency temporary venous pacer placement.  Lab results revealed glucse 154, creatinine 1.34 (which appears close to baseline), AST 47, T. Bili 1.4, troponin elevated but flat 46 > 44, no leukocytosis, hgb 10.2, BNP elevated at 355, TSH 6.9 & T4 1.45. Medications given: dobutamine, epinephrine & propofol drips started and weaned off,  dopamine drip started, glucagon IV, IV contrast, versed IV Initial Vitals: 97.6, 15 , 70 (with TC pacing), 100% with BVM Significant labs: (Labs/ Imaging personally reviewed) I, Domingo Pulse Rust-Chester, AGACNP-BC, personally viewed and interpreted this ECG. EKG Interpretation: Date: 12/09/21, EKG Time: 12:51, Rate: 76, Rhythm: NSR, QRS Axis:  LAD, Intervals: RBBB, LAFB & 1st degree HB, ST/T Wave abnormalities: non specific T wave inversions, Narrative Interpretation: NSR with complete heart block Chemistry: Na+: 140, K+: 3.8, BUN/Cr.: 21/1.34, Serum CO2/ AG: 26/9, AST Hematology: WBC: 8.6, Hgb: 10.2,  Troponin: 46 > 44, BNP: 355, COVID-19: negative ABG: 7.39/ 51/ 60/ 30.9  CXR 12/09/21: no acute cardiopulmonary process CT head wo contrast 12/09/21: no acute intracranial abnormality. Soft tissue swelling overlies the RIGHT parietal scalp. No underlying calvarial fracture. Chronic microvascular ischemic changes and cerebral volume loss CT cervical spine wo contrast 12/09/21: No significant acute findings are seen in chest, abdomen and pelvis. Small scattered linear densities in both lungs may suggest scarring or subsegmental atelectasis. There is edema and pockets of air in subcutaneous plane in the right inguinal region, possibly due to recent intervention or suggest infectious process. Scattered diverticula are seen in colon without signs of focal diverticulitis. Left renal cyst. CT chest/abdomen/pelvis w contrast 12/09/21: No recent fracture is seen. Cervical spondylosis with encroachment of neural foramina from C2-T1 levels  PCCM consulted for admission due to symptomatic bradycardia requiring emergent TV pacer placement, intubation with mechanical ventilatory support & multiple episodes of asystolic cardiac arrest. Patient transferred to ICU post TV pacer placement in stable condition with low dose Dopamine drip running.  Pertinent  Medical History  HLD HTN  Hypothyroidism Mitral  Regurgitation Aortic Stenosis  s/p TAVR (12/05/21) Obesity  Stage IIIb CKD  COPD Iron Deficiency Anemia Atrial Fibrillation (on amiodarone, coreg, and low-dose eliquis)  Former Smoker (38 pack year history) Osteoarthritis  Recurrent Bilateral Lower Extremity Cellulitis Chronic Lymphedema   Significant Hospital Events: Including procedures, antibiotic start and stop dates in addition to other pertinent events   12/09/21: Admit to ICU with symptomatic bradycardia requiring emergent TV pacer placement after multiple episodes of asystolic cardiac arrest & intubation with mechanical ventilatory support. After procedure patient Alert and able to follow commands bedside on mechanical ventilation.  Interim History / Subjective:  Patient alert and able to follow simple commands. FiO2 on ventilator 70% and patient requiring low dose Dopamine for hypotension. TV pacer settings: 70 bpm, output 3.0, sense 2.0 Will attempt to optimize for extubation in the AM  Objective   Blood pressure (!) 177/149, pulse (!) 50, temperature 97.6 F (36.4 C), temperature source Oral, resp. rate (!) 51, weight 123.1 kg, SpO2 100 %.    Vent Mode: AC FiO2 (%):  [35 %] 35 % Set Rate:  [16 bmp] 16 bmp Vt Set:  [450 mL] 450 mL PEEP:  [5 cmH20] 5 cmH20  No intake or output data in the 24 hours ending 12/09/21 1318 Filed Weights   12/09/21 1215  Weight: 123.1 kg    Examination: General: Adult female, critically ill, lying in bed intubated & sedated requiring mechanical ventilation, NAD HEENT: MM pink/moist, anicteric, atraumatic, neck supple Neuro: Alert and able to follow simple commands, PERRL +3, MAE CV: s1s2 intermittently irregular with TV pacer demand pacing at 70 bpm on monitor, no r/m/g Pulm: Regular, non labored on PRVC 70%, PEEP 5, breath sounds coarse-BUL & diminished-BLL GI: soft, rounded, non tender, bs x 4 GU: foley in place with clear yellow urine Skin: ecchymosis noted in suprapubic area and RIGHT  groin near site of recent TAVR procedure. Area soft, no signs of hematoma Extremities: warm/dry, pulses + 2 R/P, +1 edema noted BLE  Resolved Hospital Problem list     Assessment & Plan:  Symptomatic Bradycardia s/p emergent TV pacer placement Asystole Cardiac Arrest PAF PMHx: HLD, HTN BNP: 355, Troponin: 46 > 44 Patient completed [facility] at [facility] 12[facility]/21. [facility] accepted the patient for transfer, but patient became unstable and cardiac arrested. Might still transfer to [facility] for permanent pacemaker placement if needed - wean Dopamine drip PRN as hemodynamics allow - STAT BMP, Mg, Phos, CBC post arrest - echocardiogram ordered  - hold outpatient medications in the setting of bradycardia and recent intervention: Eliquis, metoprolol, amiodarone, amlodipine, lasix. Consider Heparin drip in AM for PAF. Diurese as hemodynamics allow - cardiology consulted, appreciate input  Acute Hypoxic Respiratory Failure secondary to symptomatic bradycardia in the setting of cardiac arrest - Ventilator settings: PRVC  8 mL/kg, 70% FiO2, 5 PEEP, continue ventilator support & lung protective strategies - Wean PEEP & FiO2 as tolerated, maintain SpO2 > 90% - Head of bed elevated 30 degrees, VAP protocol in place - Plateau pressures less than 30 cm H20  - Intermittent chest x-ray & ABG PRN - Daily WUA with SBT as tolerated  - Ensure adequate pulmonary hygiene  - bronchodilators PRN - PAD protocol in place: continue Fentanyl IVP & low dose Propofol drip  CKD stage 3b - Strict I/O's: alert provider if UOP < 0.5 mL/kg/hr - gentle IVF hydration  - Daily BMP, replace electrolytes PRN - Avoid nephrotoxic agents as able, ensure adequate renal perfusion  Hypothyroidism  TSH: 6.937, T4: 1.45 - continue outpatient synthroid 125 mcg  Very Mild Transaminitis - Trend hepatic function - Consider RUQ [place] PRN - avoid hepatotoxic agents - outpatient Lipitor on hold until LFT's normalize  Best Practice (right click and  "Reselect all SmartList Selections" daily)  Diet/type: NPO w/ meds via tube DVT prophylaxis: prophylactic heparin  GI prophylaxis: PPI Lines: N/A Foley:  Yes, and it is still needed Code Status:  full code Last date of multidisciplinary goals of care discussion [12/09/21]  Labs   CBC: Recent Labs  Lab 12/09/21 1224  WBC 8.6  NEUTROABS PENDING  HGB 10.2*  HCT 33.2*  MCV 98.8  PLT 161    Basic Metabolic Panel: No results for input(s): "NA", "K", "CL", "CO2", "GLUCOSE", "BUN", "CREATININE", "CALCIUM", "MG", "PHOS" in the last 168 hours. GFR: CrCl cannot be calculated (Patient's most recent lab result is older than the maximum 21 days allowed.). Recent Labs  Lab 12/09/21 1224  WBC 8.6    Liver Function Tests: No results for input(s): "AST", "ALT", "ALKPHOS", "BILITOT", "PROT", "ALBUMIN" in the last 168 hours. No results for input(s): "LIPASE", "AMYLASE" in the last 168 hours. No results for input(s): "AMMONIA" in the last 168 hours.  ABG No results found for: "PHART", "PCO2ART", "PO2ART", "HCO3", "TCO2", "ACIDBASEDEF", "O2SAT"   Coagulation Profile: No results for input(s): "INR", "PROTIME" in the last 168 hours.  Cardiac Enzymes: No results for input(s): "CKTOTAL", "CKMB", "CKMBINDEX", "TROPONINI" in the last 168 hours.  HbA1C: Hgb A1c MFr Bld  Date/Time Value Ref Range Status  06/05/2021 08:34 AM 5.4 4.8 - 5.6 % Final    Comment:             Prediabetes: 5.7 - 6.4          Diabetes: >6.4          Glycemic control for adults with diabetes: <7.0   10/19/2019 09:21 AM 5.5 4.8 - 5.6 % Final    Comment:             Prediabetes: 5.7 - 6.4          Diabetes: >6.4          Glycemic control for adults with diabetes: <7.0     CBG: No results for input(s): "GLUCAP" in the last 168 hours.  Review of Systems:   UTA- patient intubated & sedated, unable to complete interview at this time  Past Medical History:  She,  has a past medical history of Allergy, Aortic  stenosis, Atrial fibrillation ([place]), Atypical squamous cells of undetermined significance on cytologic smear of vagina (ASC-US) (12/01/2014), Cellulitis, Heart murmur, Hyperlipidemia, Hypertension, Hypothyroidism, Thyroid disease, and Wears dentures.   Surgical History:   Past Surgical History:  Procedure Laterality Date   ABDOMINAL HYSTERECTOMY  1961   BIL Oophorectomy   APPENDECTOMY  at age 81   [place] N/A 01/18/2021   Procedure: CARDIOVERSION;  Surgeon: Yolonda Kida, MD;  Location: [facility];  Service: Cardiovascular;  Laterality: N/A;   CARDIOVERSION N/A 03/23/2021   Procedure: CARDIOVERSION;  Surgeon: Corey Skains, MD;  Location: [facility];  Service: Cardiovascular;  Laterality: N/A;   CATARACT EXTRACTION W/PHACO Left 09/01/2019   Procedure: CATARACT EXTRACTION PHACO AND INTRAOCULAR LENS PLACEMENT ([place]) LEFT 8.84 00:49.4;  Surgeon: Birder Robson, MD;  Location: [place];  Service: Ophthalmology;  Laterality: Left;   CATARACT EXTRACTION W/PHACO Right 12/22/2019   Procedure: CATARACT EXTRACTION  PHACO AND INTRAOCULAR LENS PLACEMENT ([facility]) RIGHT 6.77 00:42.9;  Surgeon: Birder Robson, MD;  Location: [place];  Service: Ophthalmology;  Laterality: Right;   COLONOSCOPY WITH PROPOFOL N/A 08/27/2017   Procedure: COLONOSCOPY WITH PROPOFOL;  Surgeon: Lucilla Lame, MD;  Location: [facility]COPY;  Service: Endoscopy;  Laterality: N/A;   FOOT SURGERY Bilateral    RIGHT/LEFT HEART CATH AND CORONARY ANGIOGRAPHY N/A 04/14/2021   Procedure: RIGHT/LEFT HEART CATH AND CORONARY ANGIOGRAPHY;  Surgeon: Yolonda Kida, MD;  Location: [place] CV [facility];  Service: Cardiovascular;  Laterality: N/A;   TEE WITHOUT CARDIOVERSION N/A 03/23/2021   Procedure: TRANSESOPHAGEAL ECHOCARDIOGRAM (TEE);  Surgeon: Corey Skains, MD;  Location: [facility];  Service: Cardiovascular;  Laterality: N/A;    TONSILLECTOMY  at age 48     Social History:   reports that she quit smoking about 25 years ago. Her smoking use included cigarettes. She has never used smokeless tobacco. She reports that she does not currently use alcohol. She reports that she does not use drugs.   Family History:  Her family history includes Breast cancer in her cousin; Cancer in her brother and brother; Colon cancer in her brother; Drug abuse in her brother; Heart attack in her father; Lung cancer in her brother; Pancreatitis in her mother.   Allergies Allergies  Allergen Reactions   Bactrim [Sulfamethoxazole-Trimethoprim] Rash    Rash with blisters on legs   Lisinopril Anaphylaxis, Swelling and Cough    Close airway   Other Swelling    Persimmon causes mouth and throat swelling   Clindamycin/Lincomycin Other (See Comments)    Patient reports possible allergy to clindamycin   Tegaderm Ag Mesh 2"X2"  [Wound Dressings]     when removing the patch her skin came off.   Wound Dressing Adhesive     Adhesive from covering for PICC line tore skin   Cefazolin In Sodium Chloride Rash   Ciprofloxacin Hives and Rash   Latex Rash    Adhesive from covering for PICC line tore skin   Penicillins Swelling and Rash     Home Medications  Prior to Admission medications   Medication Sig Start Date End Date Taking? Authorizing Provider  acetaminophen (TYLENOL) 325 MG tablet Take 975 mg by mouth every 6 (six) hours as needed for mild pain or moderate pain.   Yes [provider]  acidophilus (RISAQUAD) CAPS capsule Take 1 capsule by mouth daily.   Yes [provider]  albuterol (VENTOLIN HFA) 108 (90 Base) MCG/ACT inhaler Inhale 2 puffs into the lungs every 6 (six) hours as needed for wheezing or shortness of breath.   Yes [provider]  amiodarone (PACERONE) 200 MG tablet Take 200 mg by mouth daily in the afternoon.   Yes [provider]  amLODipine (NORVASC) 5 MG tablet Take 5 mg by mouth  daily.   Yes [provider]  apixaban (ELIQUIS) 5 MG TABS tablet Take 5 mg by mouth 2 (two) times daily.   Yes [provider]  atorvastatin (LIPITOR) 20 MG tablet Take 1 tablet (20 mg total) by mouth daily. Patient taking differently: Take 20 mg by mouth at bedtime. 09/20/20  Yes Bacigalupo, Dionne Bucy, MD  Calcium-Magnesium-Zinc 500-250-12.5 MG TABS Take 2 tablets by mouth at bedtime.   Yes [provider]  cholecalciferol (VITAMIN D3) 25 MCG (1000 UNIT) tablet Take 1,000 Units by mouth daily.   Yes [provider]  furosemide (LASIX) 40 MG tablet Take 40 mg by mouth daily.  Yes [provider]  levothyroxine (SYNTHROID) 125 MCG tablet TAKE 1 TABLET BY MOUTH  DAILY BEFORE BREAKFAST 10/16/21  Yes Bacigalupo, Dionne Bucy, MD  Magnesium 500 MG TABS Take 1,000 mg by mouth at bedtime.   Yes [provider]  Multiple Vitamins-Minerals (CENTRUM SILVER 50+WOMEN) TABS Take 1 tablet by mouth daily.   Yes [provider]  oxyCODONE (OXY IR/ROXICODONE) 5 MG immediate release tablet Take 5 mg by mouth every 4 (four) hours as needed for severe pain.   Yes [provider]  polyethylene glycol (MIRALAX / GLYCOLAX) 17 g packet Take 17 g by mouth 2 (two) times daily as needed for moderate constipation or severe constipation.   Yes [provider]  Potassium Chloride ER 20 MEQ TBCR TAKE 1 TABLET BY MOUTH  DAILY WITH LASIX 09/27/21  Yes Bacigalupo, Dionne Bucy, MD  senna-docusate (SENOKOT-S) 8.6-50 MG tablet Take 2 tablets by mouth 2 (two) times daily as needed for mild constipation or moderate constipation.   Yes [provider]  Zinc 50 MG CAPS Take 50 mg by mouth daily.   Yes [provider]  metoprolol succinate (TOPROL-XL) 50 MG 24 hr tablet Take 0.5 tablets (25 mg total) by mouth daily. Patient not taking: Reported on 12/09/2021 03/23/21   Corey Skains, MD  metroNIDAZOLE (METROGEL) 0.75 % gel Apply 1 application  topically 2 (two) times daily. Patient not taking: Reported on 12/09/2021 06/02/21   Virginia Crews, MD  triamcinolone (KENALOG) 0.1 % APPLY TOPICALLY TWO TIMES  DAILY Patient not taking: Reported on 12/09/2021 04/21/20   Virginia Crews, MD     Critical care time: 65 minutes      Domingo Pulse Rust-[place], AGACNP-BC Acute Care Nurse Practitioner [facility]   [phone] / [phone] Please see Amion for pager details.

## 2021-12-09 NOTE — ED Notes (Signed)
Patient's temp has decreased to 96.8. Blankets changed to warm blankets and temperature in room adjusted to warmer temperature.

## 2021-12-09 NOTE — ED Notes (Signed)
$'40mg'Q$  of Etomidate given by [facility] RN

## 2021-12-09 NOTE — ED Notes (Signed)
Patient is intubated by Dr. Tamala Julian. 23 at the lip.

## 2021-12-09 NOTE — Progress Notes (Signed)
eLink Physician-Brief Progress Note Patient Name: Mary Kennedy DOB: Jun 05, 1940 MRN: [id number]   Date of Service  12/09/2021  HPI/Events of Note  81 yr old female s/p TAVR [facility] 12/05/21 presented with symptomatic bradycardia.  S/p cardiac arrest and TV pacemaker placement.    eICU Interventions  Chart reviewed     Intervention Category Evaluation Type: New Patient Evaluation  Mauri Brooklyn, P 12/09/2021, 11:33 PM

## 2021-12-09 NOTE — ED Notes (Signed)
Time out performed by Dr. Tamala Julian.

## 2021-12-09 NOTE — Progress Notes (Signed)
   12/09/21 1900  Clinical Encounter Type  Visited With Family  Visit Type Code  Referral From Nurse  Consult/Referral To Chaplain   Chaplain responded to call to accompany friend of patient who was coding. Chaplain provided support through meaningful presence.

## 2021-12-09 NOTE — Progress Notes (Signed)
Patient transported to cath lab after CPR event prior to [facility] transport. Remained on vent throughout cath lab procedure. Transported patient to ICU post cath procedure. Patient tolerated all respiratory interventions well.

## 2021-12-09 NOTE — ED Provider Notes (Signed)
At the time that I took over care of this patient she had already been arranged to be transferred to [facility].  Shortly after transfer team arrived and as they were getting patient transferred over to their stretcher she apparently went asystolic.  I was then called to the room.  CPR was in progress upon my arrival to the room.  After a couple of rounds of CPR we were able to get spontaneous circulation.  Please see nursing notes for exact times.  Shortly thereafter however the patient again lost pulses.  She would go into what appeared to be a ventricular rhythm.  Additionally the patient would have runs of bradycardia.  We did attempt to do transcutaneous pacing which was intermittently successful.  I do think was somewhat limited secondary to body habitus.  I did call Dr. Saralyn Pilar with cardiology given that I felt patient would benefit from a temp wire.  Additionally I discussed with Dr. Patsey Berthold with the ICU team.  Given change in patient's status I did not feel would be wise to try to transport at this time.  CRITICAL CARE Performed by: Nance Pear   Total critical care time: 45 minutes  Critical care time was exclusive of separately billable procedures and treating other patients.  Critical care was necessary to treat or prevent imminent or life-threatening deterioration.  Critical care was time spent personally by me on the following activities: development of treatment plan with patient and/or surrogate as well as nursing, discussions with consultants, evaluation of patient's response to treatment, examination of patient, obtaining history from patient or surrogate, ordering and performing treatments and interventions, ordering and review of laboratory studies, ordering and review of radiographic studies, pulse oximetry and re-evaluation of patient's condition.  Cardiopulmonary Resuscitation (CPR) Procedure Note Directed/Performed by: Nance Pear I personally directed ancillary  staff and/or performed CPR in an effort to regain return of spontaneous circulation and to maintain cardiac, neuro and systemic perfusion.      Nance Pear, MD 12/09/21 [phone]

## 2021-12-10 ENCOUNTER — Ambulatory Visit: Admission: RE | Admit: 2021-12-10 | Payer: Medicare Other | Source: Ambulatory Visit

## 2021-12-10 DIAGNOSIS — R001 Bradycardia, unspecified: Secondary | ICD-10-CM | POA: Diagnosis not present

## 2021-12-10 DIAGNOSIS — I469 Cardiac arrest, cause unspecified: Secondary | ICD-10-CM

## 2021-12-10 DIAGNOSIS — Z978 Presence of other specified devices: Secondary | ICD-10-CM

## 2021-12-10 NOTE — Discharge Summary (Signed)
Physician Discharge Summary  Patient ID: Mary Kennedy MRN: [id number] DOB/AGE: 80/01/1941 80 y.o.  Admit date: 12/09/2021 Discharge date: 12/10/2021  Admission Diagnoses:  Discharge Diagnoses:  Principal Problem:   Symptomatic bradycardia Active Problems:   Hyperlipidemia   Hypothyroidism   Atrial fibrillation (HCC)   Stage 3b chronic kidney disease ([place])   Cardiac arrest, cause unspecified ([place])   Endotracheally intubated   Discharged Condition: stable  Hospital Course: This is an 81 yo female with hx of severe aortic stenosis and underwent a TAVR at [facility] on 12/05/21 and discharged on 12/06/21 without evidence of complications.  She presented to [facility] ER on 07/29 via EMS with several syncopal episodes today 7/29. Pt also c/o lightheadedness/dizziness onset last night. She also reported waking up on the floor with some back pain.  EMS reported pt bradycardic upon their arrival and en route to the ER pt had several syncopal episodes.  Therefore, pt transcutaneous pacing initiated.   ED Course Upon arrival to the ER pt alert/oriented, however bradycardic hr 43 with intermittent syncopal episodes.  Pt required continued transcutaneous pacing and mechanical intubation. Dobutamine drip started and she was subsequently weaned off TC pacing with EKG showing NSR at 76 with evidence of 1st degree HB, LAFB, RBBB and prolonged Qtc. CT imaging results below. EDP spoke with on-call Cardiologist Dr. Saralyn Pilar & PCCM and the decision was made to transfer the patient to [place] where her TAVR procedure had been completed, now she was stable off TC pacing. Patient accepted for transfer to [place], upon [facility] flight transfer team arrival patient became asystolic losing pulses x 4 different events requiring multiple rounds of ACLS, each episode totaling 5-8 minutes of downtime per ED report. Due to instability, on-call cath team activated for emergency temporary venous pacer placement.  Lab results  revealed glucse 154, creatinine 1.34 (which appears close to baseline), AST 47, T. Bili 1.4, troponin elevated but flat 46 > 44, no leukocytosis, hgb 10.2, BNP elevated at 355, TSH 6.9 & T4 1.45. Medications given: dobutamine, epinephrine & propofol drips started and weaned off, dopamine drip started, glucagon IV, IV contrast, versed IV Initial Vitals: 97.6, 15 , 70 (with TC pacing), 100% with BVM Significant labs: (Labs/ Imaging personally reviewed) I, Domingo Pulse Rust-Chester, AGACNP-BC, personally viewed and interpreted this ECG. EKG Interpretation: Date: 12/09/21, EKG Time: 12:51, Rate: 76, Rhythm: NSR, QRS Axis:  LAD, Intervals: RBBB, LAFB & 1st degree HB, ST/T Wave abnormalities: non specific T wave inversions, Narrative Interpretation: NSR with complete heart block Chemistry: Na+: 140, K+: 3.8, BUN/Cr.: 21/1.34, Serum CO2/ AG: 26/9, AST Hematology: WBC: 8.6, Hgb: 10.2,  Troponin: 46 > 44, BNP: 355, COVID-19: negative ABG: 7.39/ 51/ 60/ 30.9   CXR 12/09/21: no acute cardiopulmonary process CT head wo contrast 12/09/21: no acute intracranial abnormality. Soft tissue swelling overlies the RIGHT parietal scalp. No underlying calvarial fracture. Chronic microvascular ischemic changes and cerebral volume loss CT cervical spine wo contrast 12/09/21: No significant acute findings are seen in chest, abdomen and pelvis. Small scattered linear densities in both lungs may suggest scarring or subsegmental atelectasis. There is edema and pockets of air in subcutaneous plane in the right inguinal region, possibly due to recent intervention or suggest infectious process. Scattered diverticula are seen in colon without signs of focal diverticulitis. Left renal cyst. CT chest/abdomen/pelvis w contrast 12/09/21: No recent fracture is seen. Cervical spondylosis with encroachment of neural foramina from C2-T1 levels   Patient transferred to ICU post TV pacer placement in stable  condition with low dose Dopamine drip  running. After procedure patient Alert and able to follow commands bedside on mechanical ventilation.  Consults: cardiology and pulmonary/intensive care  Significant Diagnostic Studies: see above  Treatments: analgesia: fentanyl IVP & versed IVP PRN, propofol drip  Discharge Exam: Blood pressure 128/64, pulse 71, temperature 99.5 F (37.5 C), resp. rate 16, weight 123.1 kg, SpO2 100 %. General: Adult female, critically ill, lying in bed intubated & sedated requiring mechanical ventilation, NAD HEENT: MM pink/moist, anicteric, atraumatic, neck supple Neuro: Alert and able to follow commands, PERRL +3, MAE CV: s1s2, intermittently irregular with demand V pacing on monitor, no r/m/g Pulm: Regular, non labored on PRVC 50% & PEEP 5, breath sounds coarse-BUL & diminished-BLL GI: soft, rounded, non tender, bs x 4 GU: foley in place with clear yellow urine Skin: ecchymosis noted in supra pubic and R groin- no hematoma noted, soft at old TAVR site Extremities: warm/dry, pulses + 2 R/P, +1 edema noted BLE  Disposition: Transfer to [place] for permanent pacemaker placement.   Allergies as of 12/10/2021       Reactions   Bactrim [sulfamethoxazole-trimethoprim] Rash   Rash with blisters on legs   Lisinopril Anaphylaxis, Swelling, Cough   Close airway   Other Swelling   Persimmon causes mouth and throat swelling   Clindamycin/lincomycin Other (See Comments)   Patient reports possible allergy to clindamycin   Tegaderm Ag Mesh 2"x2"  [wound Dressings]    when removing the patch her skin came off.   Wound Dressing Adhesive    Adhesive from covering for PICC line tore skin   Cefazolin In Sodium Chloride Rash   Ciprofloxacin Hives, Rash   Latex Rash   Adhesive from covering for PICC line tore skin   Penicillins Swelling, Rash        Medication List     STOP taking these medications    acetaminophen 325 MG tablet Commonly known as: TYLENOL   acidophilus Caps capsule    albuterol 108 (90 Base) MCG/ACT inhaler Commonly known as: VENTOLIN HFA   amiodarone 200 MG tablet Commonly known as: PACERONE   amLODipine 5 MG tablet Commonly known as: NORVASC   apixaban 5 MG Tabs tablet Commonly known as: ELIQUIS   atorvastatin 20 MG tablet Commonly known as: LIPITOR   Calcium-Magnesium-Zinc 500-250-12.5 MG Tabs   Centrum Silver 50+Women Tabs   cholecalciferol 25 MCG (1000 UNIT) tablet Commonly known as: VITAMIN D3   furosemide 40 MG tablet Commonly known as: LASIX   levothyroxine 125 MCG tablet Commonly known as: SYNTHROID   Magnesium 500 MG Tabs   metoprolol succinate 50 MG 24 hr tablet Commonly known as: TOPROL-XL   metroNIDAZOLE 0.75 % gel Commonly known as: METROGEL   oxyCODONE 5 MG immediate release tablet Commonly known as: Oxy IR/ROXICODONE   polyethylene glycol 17 g packet Commonly known as: MIRALAX / GLYCOLAX   Potassium Chloride ER 20 MEQ Tbcr   senna-docusate 8.6-50 MG tablet Commonly known as: Senokot-S   triamcinolone cream 0.1 % Commonly known as: KENALOG   Zinc 50 MG Caps         Signed: Boomer Winders L Rust-Chester 12/10/2021, 12:59 AM

## 2021-12-10 NOTE — Progress Notes (Addendum)
Spoke with Dr. Curlene Labrum CCU fellow from [facility], patient still accepted for transfer with a bed available tonight.  As the patient has remained stable post TV pacer placement without additional complication, will arrange transport through [place].  Dr. Rich Reining remains accepting physician at CICU at [facility].   Domingo Pulse Rust-[place]ster, AGACNP-BC Acute Care Nurse Practitioner [facility]   [phone] / [phone] Please see Amion for pager details.

## 2021-12-10 NOTE — Progress Notes (Signed)
Being transferred to [facility] for higher level of care and continuity of care report called to [facility] RN, awaiting response from transport. VSS, will continue to monitor

## 2021-12-10 NOTE — Progress Notes (Signed)
Transferred patient to  [place] via Critical care flight, no land critical care trucks available for 2 days. Vital signs stable, alert, and following commands at time of transfer.

## 2021-12-11 ENCOUNTER — Encounter: Payer: Self-pay | Admitting: Cardiology

## 2021-12-14 ENCOUNTER — Ambulatory Visit: Payer: Self-pay

## 2021-12-14 DIAGNOSIS — Z950 Presence of cardiac pacemaker: Secondary | ICD-10-CM | POA: Insufficient documentation

## 2021-12-14 NOTE — Patient Outreach (Signed)
  Care Coordination   Follow Up Visit Note   12/14/2021 Name: Mary Kennedy MRN: [id number] DOB: 1941-05-11  Mary Kennedy is a 81 y.o. year old female who sees Leonel Ramsay, MD for primary care. I spoke with  Olam Idler by phone today    Goals Addressed             This Visit's Progress    Review and confirm cardiac medications with Cardiologist       Care Coordination Interventions: Patient is S/P TAVR. Patient was admitted for scheduled surgery on 12/04/21. Expressed concerns regarding medication changes after surgery. Medications reviewed. Reports being on different medications for rate control in the past. Declined offer for RNCM to contact the Cardiology team today. Reports messaging Dr. Hughes/Cardiothoracic team to discuss medication changes. Discussed discharge instructions. Patient reports following discharge plan. Denies questions or urgent concerns today.  Reviewed in home needs and follow-up appointments. Reports a friend will be available to assist in the home and provide transportation. Declined current need for additional care management referrals. Reviewed worsening s/sx that require immediate medical follow up. Update 12/14/21: Patient reports she is currently hospitalized at [facility] d/t complications r/t TAVR. Discharge to be determined. Reports transitioning to the [facility] system. Reports having needed support to assist after discharge. Agreed to call for questions or assistance if needed.         SDOH assessments and interventions completed:  No   Care Coordination Interventions Activated:  Yes  Care Coordination Interventions:  Yes, provided   Follow up plan: Patient reports transitioning to a different health system. Will call for assistance if needed.  Encounter Outcome:  Pt. Visit Completed   [facility] [phone]

## 2021-12-18 ENCOUNTER — Other Ambulatory Visit: Payer: Self-pay | Admitting: *Deleted

## 2021-12-18 NOTE — Patient Outreach (Signed)
  Care Coordination [facility] Note Transition Care Management Unsuccessful Follow-up Telephone Call  Date of discharge and from where:  12/15/21 [facility]  Attempts:  1st Attempt  Reason for unsuccessful TCM follow-up call:  Left voice message.  Emelia Loron RN, BSN [place] [phone] [email or web address]

## 2021-12-19 ENCOUNTER — Other Ambulatory Visit: Payer: Self-pay | Admitting: *Deleted

## 2021-12-19 NOTE — Patient Outreach (Signed)
  Care Coordination [facility] Note Transition Care Management Follow-up Telephone Call Date of discharge and from where: 12/15/21 [facility] How have you been since you were released from the hospital? Patient states she is feeling a little better every day. Any questions or concerns? No  Items Reviewed: Did the pt receive and understand the discharge instructions provided? Yes  Medications obtained and verified? Yes  Other? No  Any new allergies since your discharge? No  Dietary orders reviewed? Yes Do you have support at home? Yes   Home Care and Equipment/Supplies: Were home health services ordered? yes If so, what is the name of the agency? [facility]  Has the agency set up a time to come to the patient's home? No, patient states she has their number to call Were any new equipment or medical supplies ordered?  Yes: rolling walker What is the name of the medical supply agency? Was delivered at the hospital Were you able to get the supplies/equipment? yes Do you have any questions related to the use of the equipment or supplies? No  Functional Questionnaire: (I = Independent and D = Dependent) ADLs: I  Bathing/Dressing- I  Meal Prep- I  Eating- I  Maintaining continence- I  Transferring/Ambulation- I  Managing Meds- I  Follow up appointments reviewed:  PCP Hospital f/u appt confirmed? No   Specialist Hospital f/u appt confirmed? Yes  Scheduled to see Dr. Ysidro Evert on 12/26/21 @ 1430. Are transportation arrangements needed? No  If their condition worsens, is the pt aware to call PCP or go to the Emergency Dept.? Yes Was the patient provided with contact information for the PCP's office or ED? Yes Was to pt encouraged to call back with questions or concerns? Yes  SDOH assessments and interventions completed:   Yes  Care Coordination Interventions Activated:  No   Care Coordination Interventions:   N/A     Encounter Outcome:  Pt. Visit Completed    Emelia Loron RN, BSN [place] [phone] [email or web address]

## 2022-01-02 ENCOUNTER — Ambulatory Visit (INDEPENDENT_AMBULATORY_CARE_PROVIDER_SITE_OTHER): Payer: Medicare Other | Admitting: Nurse Practitioner

## 2022-01-17 ENCOUNTER — Encounter (INDEPENDENT_AMBULATORY_CARE_PROVIDER_SITE_OTHER): Payer: Self-pay | Admitting: Nurse Practitioner

## 2022-01-17 ENCOUNTER — Ambulatory Visit (INDEPENDENT_AMBULATORY_CARE_PROVIDER_SITE_OTHER): Payer: Medicare Other | Admitting: Nurse Practitioner

## 2022-01-17 VITALS — BP 128/71 | HR 69 | Resp 16 | Wt 257.8 lb

## 2022-01-17 DIAGNOSIS — L03119 Cellulitis of unspecified part of limb: Secondary | ICD-10-CM

## 2022-01-17 DIAGNOSIS — I350 Nonrheumatic aortic (valve) stenosis: Secondary | ICD-10-CM | POA: Diagnosis not present

## 2022-01-17 DIAGNOSIS — I890 Lymphedema, not elsewhere classified: Secondary | ICD-10-CM

## 2022-02-09 ENCOUNTER — Encounter (INDEPENDENT_AMBULATORY_CARE_PROVIDER_SITE_OTHER): Payer: Self-pay | Admitting: Nurse Practitioner

## 2022-02-09 NOTE — Progress Notes (Signed)
Subjective:    Patient ID: Mary Kennedy, female    DOB: 10/14/1940, 81 y.o.   MRN: [id number] Chief Complaint  Patient presents with   New Patient (Initial Visit)    Ref [place] consult bilateral le edema,cellulitis of le    Mary Kennedy is an 81 y.o. female presents to our office with complaints of persistent lower extremity pain in addition to lower extremity swelling.  She is known to our practice and was last seen in 2021.  Since that time she has had a recent episode of cellulitis.  The patient notes that she continues to wear medical grade compression stockings and although it does help her lower extremity edema it has not been effective.  Her episodes of cellulitis.  She has previously had lymphatic massage which was also helpful for her.  She has a history of severe mitral regurgitation and utilization of diuretics for this.    Previous noninvasive study showed no evidence of DVT, superficial venous thrombosis, deep venous insufficiency or superficial venous reflux bilaterally.    Review of Systems  Cardiovascular:  Positive for leg swelling.  All other systems reviewed and are negative.      Objective:   Physical Exam Vitals reviewed.  HENT:     Head: Normocephalic.  Cardiovascular:     Rate and Rhythm: Normal rate.  Pulmonary:     Effort: Pulmonary effort is normal.  Musculoskeletal:     Right lower leg: Edema present.     Left lower leg: Edema present.  Skin:    General: Skin is warm and dry.     Comments: Stasis dermatitis bilaterally  Neurological:     Mental Status: She is alert and oriented to person, place, and time.  Psychiatric:        Mood and Affect: Mood normal.        Behavior: Behavior normal.        Thought Content: Thought content normal.        Judgment: Judgment normal.     BP 128/71 (BP Location: Left Arm)   Pulse 69   Resp 16   Wt 257 lb 12.8 oz (116.9 kg)   BMI 45.67 kg/m   Past Medical History:  Diagnosis Date    Allergy    Aortic stenosis    Atrial fibrillation (HCC)    Atypical squamous cells of undetermined significance on cytologic smear of vagina (ASC-US) 12/01/2014   s/p gynecology consultation 07/2010; s/p repeat pap smear with HPV.    Cellulitis    Heart murmur    Hyperlipidemia    Hypertension    Hypothyroidism    Thyroid disease    Wears dentures    partial lower, full upper    Social History   Socioeconomic History   Marital status: Divorced    Spouse name: Not on file   Number of children: 2   Years of education: Not on file   Highest education level: Some college, no degree  Occupational History   Occupation: Retired    Comment: working part-time  Tobacco Use   Smoking status: Former    Types: Cigarettes    Quit date: 05/13/1996    Years since quitting: 25.7   Smokeless tobacco: Never  Vaping Use   Vaping Use: Never used  Substance and Sexual Activity   Alcohol use: Not Currently    Alcohol/week: 0.0 - 2.0 standard drinks of alcohol   Drug use: No   Sexual activity: Not on file  Other Topics Concern   Not on file  Social History Narrative   Lives by herself.    Social Determinants of Health   Financial Resource Strain: Low Risk  (07/02/2019)   Overall Financial Resource Strain (CARDIA)    Difficulty of Paying Living Expenses: Not hard at all  Food Insecurity: No Food Insecurity (12/08/2021)   Hunger Vital Sign    Worried About Running Out of Food in the Last Year: Never true    Ran Out of Food in the Last Year: Never true  Transportation Needs: No Transportation Needs (12/08/2021)   PRAPARE - Hydrologist (Medical): No    Lack of Transportation (Non-Medical): No  Physical Activity: Inactive (07/02/2019)   Exercise Vital Sign    Days of Exercise per Week: 0 days    Minutes of Exercise per Session: 0 min  Stress: Stress Concern Present (07/02/2019)   [place]     Feeling of Stress : To some extent  Social Connections: Moderately Isolated (07/02/2019)   Social Connection and Isolation Panel [NHANES]    Frequency of Communication with Friends and Family: More than three times a week    Frequency of Social Gatherings with Friends and Family: Once a week    Attends Religious Services: More than 4 times per year    Active Member of [employer] or Organizations: No    Attends Archivist Meetings: Never    Marital Status: Divorced  Human resources officer Violence: Not At Risk (07/02/2019)   Humiliation, Afraid, Rape, and Kick questionnaire    Fear of Current or Ex-Partner: No    Emotionally Abused: No    Physically Abused: No    Sexually Abused: No    Past Surgical History:  Procedure Laterality Date   ABDOMINAL HYSTERECTOMY  1961   BIL Oophorectomy   APPENDECTOMY  at age 5   [place] 01/18/2021   Procedure: CARDIOVERSION;  Surgeon: Yolonda Kida, MD;  Location: [facility];  Service: Cardiovascular;  Laterality: N/A;   CARDIOVERSION N/A 03/23/2021   Procedure: CARDIOVERSION;  Surgeon: Corey Skains, MD;  Location: [facility];  Service: Cardiovascular;  Laterality: N/A;   CATARACT EXTRACTION W/PHACO Left 09/01/2019   Procedure: CATARACT EXTRACTION PHACO AND INTRAOCULAR LENS PLACEMENT ([place]) LEFT 8.84 00:49.4;  Surgeon: Birder Robson, MD;  Location: [place];  Service: Ophthalmology;  Laterality: Left;   CATARACT EXTRACTION W/PHACO Right 12/22/2019   Procedure: CATARACT EXTRACTION PHACO AND INTRAOCULAR LENS PLACEMENT ([facility]) RIGHT 6.77 00:42.9;  Surgeon: Birder Robson, MD;  Location: [place];  Service: Ophthalmology;  Laterality: Right;   COLONOSCOPY WITH PROPOFOL N/A 08/27/2017   Procedure: COLONOSCOPY WITH PROPOFOL;  Surgeon: Lucilla Lame, MD;  Location: [facility];  Service: Endoscopy;  Laterality: N/A;   FOOT SURGERY Bilateral    RIGHT/LEFT  HEART CATH AND CORONARY ANGIOGRAPHY N/A 04/14/2021   Procedure: RIGHT/LEFT HEART CATH AND CORONARY ANGIOGRAPHY;  Surgeon: Yolonda Kida, MD;  Location: [place] [facility];  Service: Cardiovascular;  Laterality: N/A;   TEE WITHOUT CARDIOVERSION N/A 03/23/2021   Procedure: TRANSESOPHAGEAL ECHOCARDIOGRAM (TEE);  Surgeon: Corey Skains, MD;  Location: [facility];  Service: Cardiovascular;  Laterality: N/A;   TEMPORARY PACEMAKER N/A 12/09/2021   Procedure: TEMPORARY PACEMAKER;  Surgeon: Isaias Cowman, MD;  Location: [place] [facility];  Service: Cardiovascular;  Laterality: N/A;   TONSILLECTOMY  at age 25    Family History  Problem Relation Age of Onset   Pancreatitis Mother    Drug abuse Brother        PTSD   Cancer Brother        pancreas lesion   Colon cancer Brother    Heart attack Father    Lung cancer Brother    Cancer Brother        lung cancer   Breast cancer Cousin        2nd cousin    Allergies  Allergen Reactions   Bactrim [Sulfamethoxazole-Trimethoprim] Rash    Rash with blisters on legs   Lisinopril Anaphylaxis, Swelling and Cough    Close airway   Other Swelling    Persimmon causes mouth and throat swelling   Silicone     Other reaction(s): Other (See Comments) Tegaderm Ag Mesh 2x2 wound dressing Pulled off skin with prolonged use   Clindamycin/Lincomycin Other (See Comments)    Patient reports possible allergy to clindamycin   Tegaderm Ag Mesh 2"X2"  [Wound Dressings]     when removing the patch her skin came off.   Wound Dressing Adhesive     Adhesive from covering for PICC line tore skin   Cefazolin In Sodium Chloride Rash   Ciprofloxacin Hives and Rash   Latex Rash    Adhesive from covering for PICC line tore skin   Penicillins Swelling and Rash       Latest Ref Rng & Units 12/09/2021    8:49 PM 12/09/2021   12:24 PM 07/08/2021    5:48 AM  CBC  WBC 4.0 - 10.5 K/uL 15.6  8.6  8.7   Hemoglobin 12.0 - 15.0 g/dL 10.0  10.2  11.3    Hematocrit 36.0 - 46.0 % 31.9  33.2  34.8   Platelets 150 - 400 K/uL 123  158  284       CMP     Component Value Date/Time   NA 139 12/09/2021 2049   NA 138 09/25/2021 1059   NA 144 06/04/2014 1037   K 3.5 12/09/2021 2049   K 4.0 06/04/2014 1037   CL 105 12/09/2021 2049   CL 109 (H) 06/04/2014 1037   CO2 27 12/09/2021 2049   CO2 28 06/04/2014 1037   GLUCOSE 191 (H) 12/09/2021 2049   GLUCOSE 106 (H) 06/04/2014 1037   BUN 24 (H) 12/09/2021 2049   BUN 15 09/25/2021 1059   BUN 16 06/04/2014 1037   CREATININE 1.54 (H) 12/09/2021 2049   CREATININE 1.02 (H) 04/10/2017 1625   CALCIUM 8.6 (L) 12/09/2021 2049   CALCIUM 9.2 06/04/2014 1037   PROT 7.0 12/09/2021 1224   PROT 6.5 09/25/2021 1059   PROT 6.8 06/04/2014 1037   ALBUMIN 3.9 12/09/2021 1224   ALBUMIN 3.9 09/25/2021 1059   ALBUMIN 3.5 06/04/2014 1037   AST 47 (H) 12/09/2021 1224   AST 36 06/04/2014 1037   ALT 33 12/09/2021 1224   ALT 38 06/04/2014 1037   ALKPHOS 68 12/09/2021 1224   ALKPHOS 67 06/04/2014 1037   BILITOT 1.4 (H) 12/09/2021 1224   BILITOT 0.8 09/25/2021 1059   BILITOT 0.5 06/04/2014 1037   GFRNONAA 34 (L) 12/09/2021 2049   GFRNONAA 54 (L) 04/10/2017 1625   GFRAA 46 (L) 12/03/2019 1008   GFRAA 62 04/10/2017 1625     No results found.     Assessment & Plan:   1. Lymphedema Recommend:  No surgery or intervention at this point in  time.    I have reviewed my discussion with the patient regarding lymphedema and why it  causes symptoms.  Patient will continue wearing graduated compression on a daily basis. The patient should put the compression on first thing in the morning and removing them in the evening. The patient should not sleep in the compression.   In addition, behavioral modification throughout the day will be continued.  This will include frequent elevation (such as in a recliner), use of over the counter pain medications as needed and exercise such as walking.  The systemic causes for  chronic edema such as liver, kidney and cardiac etiologies do not appear to have significant changed over the past year.    Despite conservative treatments at least 4 weeks including graduated compression therapy class 1 and behavioral modification including exercise and elevation the patient  has not obtained adequate control of the lymphedema.  The patient still has stage 3 lymphedema and therefore, I believe that a lymph pump should be added to improve the control of the patient's lymphedema.  Additionally, a lymph pump is warranted because it will reduce the risk of cellulitis and ulceration in the future.  We will have the patient return for repeat bilateral reflux studies to determine if there has been any worsening or change in venous insufficiency.  2. Aortic valve stenosis, etiology of cardiac valve disease unspecified This also likely contributes to the patient's lower extremity edema  3. Cellulitis of lower extremity, unspecified laterality Currently resolved   Current Outpatient Medications on File Prior to Visit  Medication Sig Dispense Refill   amiodarone (PACERONE) 200 MG tablet Take 100 mg by mouth daily.     amLODipine (NORVASC) 10 MG tablet Take by mouth.     apixaban (ELIQUIS) 2.5 MG TABS tablet Take 2.5 mg by mouth 2 (two) times daily.     Biotin 1 MG CAPS Take 1 capsule by mouth daily.     CALCIUM-MAGNESUIUM-ZINC 333-133-8.3 MG TABS Take 2 tablets by mouth at bedtime.     Cholecalciferol (D3-1000) 25 MCG (1000 UT) capsule Take 1,000 Units by mouth daily.     Chromium Picolinate 1000 MCG TABS Take 1 tablet by mouth daily.     Coenzyme Q10 (CO Q 10 PO) Take 1 tablet by mouth daily.     furosemide (LASIX) 40 MG tablet Take 1 tablet by mouth 2 (two) times daily.     LACTOBACILLUS PO Take 1 capsule by mouth daily.     levothyroxine (SYNTHROID) 125 MCG tablet Take 125 mcg by mouth daily before breakfast.     MAGNESIUM MALATE PO Take 1,000 mg by mouth at bedtime.      metoprolol succinate (TOPROL-XL) 50 MG 24 hr tablet Take 50 mg by mouth daily.     Multiple Vitamins-Minerals (MULTIVITAMIN MEN 50+ PO) Take 1 tablet by mouth daily.     potassium chloride (KLOR-CON) 20 MEQ packet Take 20 mEq by mouth daily.     tiotropium (SPIRIVA HANDIHALER) 18 MCG inhalation capsule Place into inhaler and inhale.     triamcinolone ointment (KENALOG) 0.1 % Apply 1 Application topically 2 (two) times daily.     zinc gluconate 50 MG tablet Take 50 mg by mouth daily.     No current facility-administered medications on file prior to visit.    There are no Patient Instructions on file for this visit. No follow-ups on file.   Kris Hartmann, NP

## 2022-02-12 ENCOUNTER — Other Ambulatory Visit (INDEPENDENT_AMBULATORY_CARE_PROVIDER_SITE_OTHER): Payer: Self-pay | Admitting: Nurse Practitioner

## 2022-02-12 DIAGNOSIS — M79606 Pain in leg, unspecified: Secondary | ICD-10-CM

## 2022-02-13 ENCOUNTER — Encounter: Payer: Medicare Other | Attending: Internal Medicine | Admitting: *Deleted

## 2022-02-13 DIAGNOSIS — Z952 Presence of prosthetic heart valve: Secondary | ICD-10-CM | POA: Insufficient documentation

## 2022-02-13 NOTE — Progress Notes (Signed)
Initial phone call completed. Diagnosis can be found in [facility] 7/24. EP Orientation scheduled for Wednesday 10/11 at 9am.

## 2022-02-14 ENCOUNTER — Encounter (INDEPENDENT_AMBULATORY_CARE_PROVIDER_SITE_OTHER): Payer: Self-pay | Admitting: Nurse Practitioner

## 2022-02-14 ENCOUNTER — Ambulatory Visit (INDEPENDENT_AMBULATORY_CARE_PROVIDER_SITE_OTHER): Payer: Medicare Other

## 2022-02-14 ENCOUNTER — Ambulatory Visit (INDEPENDENT_AMBULATORY_CARE_PROVIDER_SITE_OTHER): Payer: Medicare Other | Admitting: Nurse Practitioner

## 2022-02-14 VITALS — BP 131/65 | HR 79 | Resp 16 | Wt 253.8 lb

## 2022-02-14 DIAGNOSIS — M79606 Pain in leg, unspecified: Secondary | ICD-10-CM

## 2022-02-14 DIAGNOSIS — I890 Lymphedema, not elsewhere classified: Secondary | ICD-10-CM | POA: Diagnosis not present

## 2022-02-14 DIAGNOSIS — L03119 Cellulitis of unspecified part of limb: Secondary | ICD-10-CM | POA: Diagnosis not present

## 2022-02-14 DIAGNOSIS — M7989 Other specified soft tissue disorders: Secondary | ICD-10-CM

## 2022-02-18 ENCOUNTER — Encounter (INDEPENDENT_AMBULATORY_CARE_PROVIDER_SITE_OTHER): Payer: Self-pay | Admitting: Nurse Practitioner

## 2022-02-18 NOTE — Progress Notes (Signed)
Subjective:    Patient ID: CHEREESE Kennedy, female    DOB: [birth year], 81 y.o.   MRN: [id number] Chief Complaint  Patient presents with   Follow-up    Ultrasound follow up    Mary Kennedy is an 81 y.o. female presents to our office with complaints of persistent lower extremity pain in addition to lower extremity swelling.  She is known to our practice and was last seen in 2021.  Since that time she has had a recent episode of cellulitis.  The patient notes that she continues to wear medical grade compression stockings and although it does help her lower extremity edema it has not been effective.   She has previously had lymphatic massage which was also helpful for her.  She has a history of severe mitral regurgitation and utilization of diuretics for this.  Today noninvasive studies show no evidence of DVT or superficial thrombophlebitis bilaterally.  No evidence of deep venous insufficiency or superficial venous reflux bilaterally.    Review of Systems  Cardiovascular:  Positive for leg swelling.  All other systems reviewed and are negative.      Objective:   Physical Exam Vitals reviewed.  HENT:     Head: Normocephalic.  Cardiovascular:     Rate and Rhythm: Normal rate.     Pulses: Normal pulses.  Pulmonary:     Effort: Pulmonary effort is normal.  Musculoskeletal:     Right lower leg: Edema present.     Left lower leg: Edema present.  Skin:    General: Skin is warm and dry.     Comments: Dermal thickening and stasis dermatitis bilaterally  Neurological:     Mental Status: She is alert and oriented to person, place, and time.  Psychiatric:        Mood and Affect: Mood normal.        Behavior: Behavior normal.        Thought Content: Thought content normal.        Judgment: Judgment normal.     BP 131/65 (BP Location: Left Arm)   Pulse 79   Resp 16   Wt 253 lb 12.8 oz (115.1 kg)   BMI 44.96 kg/m   Past Medical History:  Diagnosis Date   Allergy     Aortic stenosis    Atrial fibrillation (HCC)    Atypical squamous cells of undetermined significance on cytologic smear of vagina (ASC-US) 12/01/2014   s/p gynecology consultation 07/2010; s/p repeat pap smear with HPV.    Cellulitis    Heart murmur    Hyperlipidemia    Hypertension    Hypothyroidism    Thyroid disease    Wears dentures    partial lower, full upper    Social History   Socioeconomic History   Marital status: Divorced    Spouse name: Not on file   Number of children: 2   Years of education: Not on file   Highest education level: Some college, no degree  Occupational History   Occupation: Retired    Comment: working part-time  Tobacco Use   Smoking status: Former    Types: Cigarettes    Quit date: 05/13/1996    Years since quitting: 25.7   Smokeless tobacco: Never  Vaping Use   Vaping Use: Never used  Substance and Sexual Activity   Alcohol use: Not Currently    Alcohol/week: 0.0 - 2.0 standard drinks of alcohol   Drug use: No   Sexual activity: Not on file  Other Topics Concern   Not on file  Social History Narrative   Lives by herself.    Social Determinants of Health   Financial Resource Strain: Low Risk  (07/02/2019)   Overall Financial Resource Strain (CARDIA)    Difficulty of Paying Living Expenses: Not hard at all  Food Insecurity: No Food Insecurity (12/08/2021)   Hunger Vital Sign    Worried About Running Out of Food in the Last Year: Never true    Ran Out of Food in the Last Year: Never true  Transportation Needs: No Transportation Needs (12/08/2021)   PRAPARE - Hydrologist (Medical): No    Lack of Transportation (Non-Medical): No  Physical Activity: Inactive (07/02/2019)   Exercise Vital Sign    Days of Exercise per Week: 0 days    Minutes of Exercise per Session: 0 min  Stress: Stress Concern Present (07/02/2019)   [place]    Feeling of  Stress : To some extent  Social Connections: Moderately Isolated (07/02/2019)   Social Connection and Isolation Panel [NHANES]    Frequency of Communication with Friends and Family: More than three times a week    Frequency of Social Gatherings with Friends and Family: Once a week    Attends Religious Services: More than 4 times per year    Active Member of [employer] or Organizations: No    Attends Archivist Meetings: Never    Marital Status: Divorced  Human resources officer Violence: Not At Risk (07/02/2019)   Humiliation, Afraid, Rape, and Kick questionnaire    Fear of Current or Ex-Partner: No    Emotionally Abused: No    Physically Abused: No    Sexually Abused: No    Past Surgical History:  Procedure Laterality Date   ABDOMINAL HYSTERECTOMY  1961   BIL Oophorectomy   APPENDECTOMY  at age 72   [place] 01/18/2021   Procedure: CARDIOVERSION;  Surgeon: Yolonda Kida, MD;  Location: [place] [facility];  Service: Cardiovascular;  Laterality: N/A;   CARDIOVERSION N/A 03/23/2021   Procedure: CARDIOVERSION;  Surgeon: Corey Skains, MD;  Location: [facility];  Service: Cardiovascular;  Laterality: N/A;   CATARACT EXTRACTION W/PHACO Left 09/01/2019   Procedure: CATARACT EXTRACTION PHACO AND INTRAOCULAR LENS PLACEMENT ([place]) LEFT 8.84 00:49.4;  Surgeon: Birder Robson, MD;  Location: [place];  Service: Ophthalmology;  Laterality: Left;   CATARACT EXTRACTION W/PHACO Right 12/22/2019   Procedure: CATARACT EXTRACTION PHACO AND INTRAOCULAR LENS PLACEMENT ([facility]) RIGHT 6.77 00:42.9;  Surgeon: Birder Robson, MD;  Location: [place];  Service: Ophthalmology;  Laterality: Right;   COLONOSCOPY WITH PROPOFOL N/A 08/27/2017   Procedure: COLONOSCOPY WITH PROPOFOL;  Surgeon: Lucilla Lame, MD;  Location: [facility];  Service: Endoscopy;  Laterality: N/A;   FOOT SURGERY Bilateral    RIGHT/LEFT HEART CATH  AND CORONARY ANGIOGRAPHY N/A 04/14/2021   Procedure: RIGHT/LEFT HEART CATH AND CORONARY ANGIOGRAPHY;  Surgeon: Yolonda Kida, MD;  Location: [place] [facility];  Service: Cardiovascular;  Laterality: N/A;   TEE WITHOUT CARDIOVERSION N/A 03/23/2021   Procedure: TRANSESOPHAGEAL ECHOCARDIOGRAM (TEE);  Surgeon: Corey Skains, MD;  Location: [facility];  Service: Cardiovascular;  Laterality: N/A;   TEMPORARY PACEMAKER N/A 12/09/2021   Procedure: TEMPORARY PACEMAKER;  Surgeon: Isaias Cowman, MD;  Location: [place] [facility];  Service: Cardiovascular;  Laterality: N/A;   TONSILLECTOMY  at age 25    Family History  Problem Relation Age of Onset   Pancreatitis Mother    Drug abuse Brother        PTSD   Cancer Brother        pancreas lesion   Colon cancer Brother    Heart attack Father    Lung cancer Brother    Cancer Brother        lung cancer   Breast cancer Cousin        2nd cousin    Allergies  Allergen Reactions   Bactrim [Sulfamethoxazole-Trimethoprim] Rash    Rash with blisters on legs   Ciprofloxacin Hives and Rash    Hives, rash   Lisinopril Anaphylaxis, Swelling and Cough    Close airway   Other Swelling    Persimmon causes mouth and throat swelling   Silicone     Other reaction(s): Other (See Comments) Tegaderm Ag Mesh 2x2 wound dressing Pulled off skin with prolonged use   Clindamycin/Lincomycin Other (See Comments)    Patient reports possible allergy to clindamycin   Tegaderm Ag Mesh 2"X2"  [Wound Dressings]     when removing the patch her skin came off.   Wound Dressing Adhesive     Adhesive from covering for PICC line tore skin   Cefazolin In Sodium Chloride Rash   Latex Rash    Adhesive from covering for PICC line tore skin   Penicillins Swelling and Rash       Latest Ref Rng & Units 12/09/2021    8:49 PM 12/09/2021   12:24 PM 07/08/2021    5:48 AM  CBC  WBC 4.0 - 10.5 K/uL 15.6  8.6  8.7   Hemoglobin 12.0 - 15.0 g/dL 10.0  10.2  11.3    Hematocrit 36.0 - 46.0 % 31.9  33.2  34.8   Platelets 150 - 400 K/uL 123  158  284       CMP     Component Value Date/Time   NA 139 12/09/2021 2049   NA 138 09/25/2021 1059   NA 144 06/04/2014 1037   K 3.5 12/09/2021 2049   K 4.0 06/04/2014 1037   CL 105 12/09/2021 2049   CL 109 (H) 06/04/2014 1037   CO2 27 12/09/2021 2049   CO2 28 06/04/2014 1037   GLUCOSE 191 (H) 12/09/2021 2049   GLUCOSE 106 (H) 06/04/2014 1037   BUN 24 (H) 12/09/2021 2049   BUN 15 09/25/2021 1059   BUN 16 06/04/2014 1037   CREATININE 1.54 (H) 12/09/2021 2049   CREATININE 1.02 (H) 04/10/2017 1625   CALCIUM 8.6 (L) 12/09/2021 2049   CALCIUM 9.2 06/04/2014 1037   PROT 7.0 12/09/2021 1224   PROT 6.5 09/25/2021 1059   PROT 6.8 06/04/2014 1037   ALBUMIN 3.9 12/09/2021 1224   ALBUMIN 3.9 09/25/2021 1059   ALBUMIN 3.5 06/04/2014 1037   AST 47 (H) 12/09/2021 1224   AST 36 06/04/2014 1037   ALT 33 12/09/2021 1224   ALT 38 06/04/2014 1037   ALKPHOS 68 12/09/2021 1224   ALKPHOS 67 06/04/2014 1037   BILITOT 1.4 (H) 12/09/2021 1224   BILITOT 0.8 09/25/2021 1059   BILITOT 0.5 06/04/2014 1037   GFRNONAA 34 (L) 12/09/2021 2049   GFRNONAA 54 (L) 04/10/2017 1625   GFRAA 46 (L) 12/03/2019 1008   GFRAA 62 04/10/2017 1625     No results found.     Assessment & Plan:   1. Lymphedema Recommend:   No surgery  or intervention at this point in time.     I have reviewed my discussion with the patient regarding lymphedema and why it  causes symptoms.  Patient will continue wearing graduated compression on a daily basis. The patient should put the compression on first thing in the morning and removing them in the evening. The patient should not sleep in the compression.    In addition, behavioral modification throughout the day will be continued.  This will include frequent elevation (such as in a recliner), use of over the counter pain medications as needed and exercise such as walking.   The systemic causes  for chronic edema such as liver, kidney and cardiac etiologies do not appear to have significant changed over the past year.     Despite conservative treatments at least 4 weeks including graduated compression therapy class 1 and behavioral modification including exercise and elevation the patient  has not obtained adequate control of the lymphedema.  The patient still has stage 3 lymphedema and therefore, I believe that a lymph pump should be added to improve the control of the patient's lymphedema.  Additionally, a lymph pump is warranted because it will reduce the risk of cellulitis and ulceration in the future.    The patient will follow up in 6 months.  2. Cellulitis of lower extremity, unspecified laterality Resolved   Current Outpatient Medications on File Prior to Visit  Medication Sig Dispense Refill   amiodarone (PACERONE) 200 MG tablet Take 100 mg by mouth daily.     amLODipine (NORVASC) 10 MG tablet Take by mouth.     apixaban (ELIQUIS) 2.5 MG TABS tablet Take 2.5 mg by mouth 2 (two) times daily.     atorvastatin (LIPITOR) 20 MG tablet Take 20 mg by mouth daily.     Biotin 1 MG CAPS Take 1 capsule by mouth daily.     CALCIUM-MAGNESUIUM-ZINC 333-133-8.3 MG TABS Take 2 tablets by mouth at bedtime.     Cholecalciferol (D3-1000) 25 MCG (1000 UT) capsule Take 1,000 Units by mouth daily.     Chromium Picolinate 1000 MCG TABS Take 1 tablet by mouth daily.     Coenzyme Q10 (CO Q 10 PO) Take 1 tablet by mouth daily.     furosemide (LASIX) 40 MG tablet Take 1 tablet by mouth 2 (two) times daily.     LACTOBACILLUS PO Take 1 capsule by mouth daily.     levothyroxine (SYNTHROID) 125 MCG tablet Take 125 mcg by mouth daily before breakfast.     MAGNESIUM MALATE PO Take 1,000 mg by mouth at bedtime.     metoprolol succinate (TOPROL-XL) 50 MG 24 hr tablet Take 50 mg by mouth daily.     Multiple Vitamins-Minerals (MULTIVITAMIN MEN 50+ PO) Take 1 tablet by mouth daily.     potassium chloride  (KLOR-CON) 20 MEQ packet Take 20 mEq by mouth daily.     tiotropium (SPIRIVA HANDIHALER) 18 MCG inhalation capsule Place into inhaler and inhale.     triamcinolone ointment (KENALOG) 0.1 % Apply 1 Application topically 2 (two) times daily.     zinc gluconate 50 MG tablet Take 50 mg by mouth daily.     No current facility-administered medications on file prior to visit.    There are no Patient Instructions on file for this visit. No follow-ups on file.   Kris Hartmann, NP

## 2022-02-21 ENCOUNTER — Encounter: Payer: Medicare Other | Admitting: *Deleted

## 2022-02-21 VITALS — Ht 65.5 in | Wt 257.1 lb

## 2022-02-21 DIAGNOSIS — Z952 Presence of prosthetic heart valve: Secondary | ICD-10-CM

## 2022-02-21 NOTE — Progress Notes (Signed)
Cardiac Individual Treatment Plan  Patient Details  Name: Mary Kennedy MRN: [id number] Date of Birth: Sep 06, 1940 Referring Provider:   Flowsheet Row Cardiac Rehab from 02/21/2022 in [facility] Cardiac and Pulmonary Rehab  Referring Provider Katrine Coho MD       Initial Encounter Date:  Flowsheet Row Cardiac Rehab from 02/21/2022 in [facility] Cardiac and Pulmonary Rehab  Date 02/21/22       Visit Diagnosis: S/P TAVR (transcatheter aortic valve replacement)  Patient's Home Medications on Admission:  Current Outpatient Medications:    amiodarone (PACERONE) 200 MG tablet, Take 100 mg by mouth daily., Disp: , Rfl:    amLODipine (NORVASC) 10 MG tablet, Take by mouth., Disp: , Rfl:    apixaban (ELIQUIS) 2.5 MG TABS tablet, Take 2.5 mg by mouth 2 (two) times daily., Disp: , Rfl:    atorvastatin (LIPITOR) 20 MG tablet, Take 20 mg by mouth daily., Disp: , Rfl:    Biotin 1 MG CAPS, Take 1 capsule by mouth daily., Disp: , Rfl:    CALCIUM-MAGNESUIUM-ZINC 333-133-8.3 MG TABS, Take 2 tablets by mouth at bedtime., Disp: , Rfl:    Cholecalciferol (D3-1000) 25 MCG (1000 UT) capsule, Take 1,000 Units by mouth daily., Disp: , Rfl:    Chromium Picolinate 1000 MCG TABS, Take 1 tablet by mouth daily., Disp: , Rfl:    Coenzyme Q10 (CO Q 10 PO), Take 1 tablet by mouth daily., Disp: , Rfl:    furosemide (LASIX) 40 MG tablet, Take 1 tablet by mouth 2 (two) times daily., Disp: , Rfl:    LACTOBACILLUS PO, Take 1 capsule by mouth daily., Disp: , Rfl:    levothyroxine (SYNTHROID) 125 MCG tablet, Take 125 mcg by mouth daily before breakfast., Disp: , Rfl:    MAGNESIUM MALATE PO, Take 1,000 mg by mouth at bedtime., Disp: , Rfl:    metoprolol succinate (TOPROL-XL) 50 MG 24 hr tablet, Take 50 mg by mouth daily., Disp: , Rfl:    Multiple Vitamins-Minerals (MULTIVITAMIN MEN 50+ PO), Take 1 tablet by mouth daily., Disp: , Rfl:    potassium chloride (KLOR-CON) 20 MEQ packet, Take 20 mEq by mouth daily., Disp: , Rfl:     tiotropium (SPIRIVA HANDIHALER) 18 MCG inhalation capsule, Place into inhaler and inhale., Disp: , Rfl:    triamcinolone ointment (KENALOG) 0.1 %, Apply 1 Application topically 2 (two) times daily., Disp: , Rfl:    zinc gluconate 50 MG tablet, Take 50 mg by mouth daily., Disp: , Rfl:   Past Medical History: Past Medical History:  Diagnosis Date   Allergy    Aortic stenosis    Atrial fibrillation ([place])    Atypical squamous cells of undetermined significance on cytologic smear of vagina (ASC-US) 12/01/2014   s/p gynecology consultation 07/2010; s/p repeat pap smear with HPV.    Cellulitis    Heart murmur    Hyperlipidemia    Hypertension    Hypothyroidism    Thyroid disease    Wears dentures    partial lower, full upper    Tobacco Use: Social History   Tobacco Use  Smoking Status Former   Types: Cigarettes   Quit date: 05/13/1996   Years since quitting: 25.7  Smokeless Tobacco Never    Labs: Review Flowsheet  More data exists      Latest Ref Rng & Units 06/23/2018 10/19/2019 09/21/2020 06/05/2021 12/09/2021  Labs for ITP Cardiac and Pulmonary Rehab  Cholestrol 100 - 199 mg/dL 173  163  157  155  -  LDL (  calc) 0 - 99 mg/dL 110  102  95  96  -  HDL-C >39 mg/dL 44  41  46  45  -  Trlycerides 0 - 149 mg/dL 97  108  85  69  -  Hemoglobin A1c 4.8 - 5.6 % 6.0  5.5  - 5.4  -  Bicarbonate 20.0 - 28.0 mmol/L - - - - 30.9   O2 Saturation % - - - - 90.1      Exercise Target Goals: Exercise Program Goal: Individual exercise prescription set using results from initial 6 min walk test and THRR while considering  patient's activity barriers and safety.   Exercise Prescription Goal: Initial exercise prescription builds to 30-45 minutes a day of aerobic activity, 2-3 days per week.  Home exercise guidelines will be given to patient during program as part of exercise prescription that the participant will acknowledge.   Education: Aerobic Exercise: - Group verbal and visual  presentation on the components of exercise prescription. Introduces F.I.T.T principle from ACSM for exercise prescriptions.  Reviews F.I.T.T. principles of aerobic exercise including progression. Written material given at graduation. Flowsheet Row Cardiac Rehab from 02/21/2022 in [facility] Cardiac and Pulmonary Rehab  Education need identified 02/21/22       Education: Resistance Exercise: - Group verbal and visual presentation on the components of exercise prescription. Introduces F.I.T.T principle from ACSM for exercise prescriptions  Reviews F.I.T.T. principles of resistance exercise including progression. Written material given at graduation.    Education: Exercise & Equipment Safety: - Individual verbal instruction and demonstration of equipment use and safety with use of the equipment. Flowsheet Row Cardiac Rehab from 02/21/2022 in [facility] Cardiac and Pulmonary Rehab  Date 02/21/22  Educator [facility]  Instruction Review Code 1- Verbalizes Understanding       Education: Exercise Physiology & General Exercise Guidelines: - Group verbal and written instruction with models to review the exercise physiology of the cardiovascular system and associated critical values. Provides general exercise guidelines with specific guidelines to those with heart or lung disease.    Education: Flexibility, Balance, Mind/Body Relaxation: - Group verbal and visual presentation with interactive activity on the components of exercise prescription. Introduces F.I.T.T principle from ACSM for exercise prescriptions. Reviews F.I.T.T. principles of flexibility and balance exercise training including progression. Also discusses the mind body connection.  Reviews various relaxation techniques to help reduce and manage stress (i.e. Deep breathing, progressive muscle relaxation, and visualization). Balance handout provided to take home. Written material given at graduation.   Activity Barriers & Risk Stratification:  Activity  Barriers & Cardiac Risk Stratification - 02/21/22 1230       Activity Barriers & Cardiac Risk Stratification   Activity Barriers Joint Problems;Arthritis;Deconditioning;Balance Concerns;Shortness of Breath;Decreased Ventricular Function    Cardiac Risk Stratification High             6 Minute Walk:  6 Minute Walk     Row Name 02/21/22 1229         6 Minute Walk   Phase Initial     Distance 790 feet     Walk Time 5.3 minutes     # of Rest Breaks 2  22 sec, 20 sec     MPH 1.69     METS 1.16     RPE 15     Perceived Dyspnea  4     VO2 Peak 4.05     Symptoms Yes (comment)     Comments SOB, bilateral knee pain 4/10  Resting HR 71 bpm     Resting BP 126/74     Resting Oxygen Saturation  97 %     Exercise Oxygen Saturation  during 6 min walk 97 %     Max Ex. HR 109 bpm     Max Ex. BP 186/84     2 Minute Post BP 164/80              Oxygen Initial Assessment:   Oxygen Re-Evaluation:   Oxygen Discharge (Final Oxygen Re-Evaluation):   Initial Exercise Prescription:  Initial Exercise Prescription - 02/21/22 1200       Date of Initial Exercise RX and Referring Provider   Date 02/21/22    Referring Provider Katrine Coho MD      Oxygen   Maintain Oxygen Saturation 88% or higher      Treadmill   MPH 1.3    Grade 0    Minutes 15    METs 2      Recumbant Elliptical   Level 1    RPM 50    Minutes 15    METs 2      REL-XR   Level 1    Speed 50    Minutes 15    METs 2      Track   Laps 20    Minutes 15    METs 2.09      Prescription Details   Frequency (times per week) 3    Duration Progress to 30 minutes of continuous aerobic without signs/symptoms of physical distress      Intensity   THRR 40-80% of Max Heartrate 99-126    Ratings of Perceived Exertion 11-13    Perceived Dyspnea 0-4      Progression   Progression Continue to progress workloads to maintain intensity without signs/symptoms of physical distress.      Resistance  Training   Training Prescription Yes    Weight 3 lb    Reps 10-15             Perform Capillary Blood Glucose checks as needed.  Exercise Prescription Changes:   Exercise Prescription Changes     Row Name 02/21/22 1200             Response to Exercise   Blood Pressure (Admit) 126/74       Blood Pressure (Exercise) 186/84       Blood Pressure (Exit) 164/80       Heart Rate (Admit) 71 bpm       Heart Rate (Exercise) 109 bpm       Heart Rate (Exit) 71 bpm       Oxygen Saturation (Admit) 97 %       Oxygen Saturation (Exercise) 97 %       Rating of Perceived Exertion (Exercise) 15       Perceived Dyspnea (Exercise) 4       Symptoms SOB, bilateral knee pain 4/10       Comments walk test results                Exercise Comments:   Exercise Goals and Review:   Exercise Goals     Row Name 02/21/22 1233             Exercise Goals   Increase Physical Activity Yes       Intervention Provide advice, education, support and counseling about physical activity/exercise needs.;Develop an individualized exercise prescription for aerobic and resistive training based on initial evaluation  findings, risk stratification, comorbidities and participant's personal goals.       Expected Outcomes Short Term: Attend rehab on a regular basis to increase amount of physical activity.;Long Term: Add in home exercise to make exercise part of routine and to increase amount of physical activity.;Long Term: Exercising regularly at least 3-5 days a week.       Increase Strength and Stamina Yes       Intervention Provide advice, education, support and counseling about physical activity/exercise needs.;Develop an individualized exercise prescription for aerobic and resistive training based on initial evaluation findings, risk stratification, comorbidities and participant's personal goals.       Expected Outcomes Short Term: Increase workloads from initial exercise prescription for resistance,  speed, and METs.;Short Term: Perform resistance training exercises routinely during rehab and add in resistance training at home;Long Term: Improve cardiorespiratory fitness, muscular endurance and strength as measured by increased METs and functional capacity (6MWT)       Able to understand and use rate of perceived exertion (RPE) scale Yes       Intervention Provide education and explanation on how to use RPE scale       Expected Outcomes Short Term: Able to use RPE daily in rehab to express subjective intensity level;Long Term:  Able to use RPE to guide intensity level when exercising independently       Able to understand and use Dyspnea scale Yes       Intervention Provide education and explanation on how to use Dyspnea scale       Expected Outcomes Short Term: Able to use Dyspnea scale daily in rehab to express subjective sense of shortness of breath during exertion;Long Term: Able to use Dyspnea scale to guide intensity level when exercising independently       Knowledge and understanding of Target Heart Rate Range (THRR) Yes       Intervention Provide education and explanation of THRR including how the numbers were predicted and where they are located for reference       Expected Outcomes Short Term: Able to state/look up THRR;Long Term: Able to use THRR to govern intensity when exercising independently;Short Term: Able to use daily as guideline for intensity in rehab       Able to check pulse independently Yes       Intervention Provide education and demonstration on how to check pulse in carotid and radial arteries.;Review the importance of being able to check your own pulse for safety during independent exercise       Expected Outcomes Short Term: Able to explain why pulse checking is important during independent exercise;Long Term: Able to check pulse independently and accurately       Understanding of Exercise Prescription Yes       Intervention Provide education, explanation, and written  materials on patient's individual exercise prescription       Expected Outcomes Short Term: Able to explain program exercise prescription;Long Term: Able to explain home exercise prescription to exercise independently                Exercise Goals Re-Evaluation :   Discharge Exercise Prescription (Final Exercise Prescription Changes):  Exercise Prescription Changes - 02/21/22 1200       Response to Exercise   Blood Pressure (Admit) 126/74    Blood Pressure (Exercise) 186/84    Blood Pressure (Exit) 164/80    Heart Rate (Admit) 71 bpm    Heart Rate (Exercise) 109 bpm    Heart Rate (Exit)  71 bpm    Oxygen Saturation (Admit) 97 %    Oxygen Saturation (Exercise) 97 %    Rating of Perceived Exertion (Exercise) 15    Perceived Dyspnea (Exercise) 4    Symptoms SOB, bilateral knee pain 4/10    Comments walk test results             Nutrition:  Target Goals: Understanding of nutrition guidelines, daily intake of sodium '1500mg'$ , cholesterol '200mg'$ , calories 30% from fat and 7% or less from saturated fats, daily to have 5 or more servings of fruits and vegetables.  Education: All About Nutrition: -Group instruction provided by verbal, written material, interactive activities, discussions, models, and posters to present general guidelines for heart healthy nutrition including fat, fiber, MyPlate, the role of sodium in heart healthy nutrition, utilization of the nutrition label, and utilization of this knowledge for meal planning. Follow up email sent as well. Written material given at graduation. Flowsheet Row Cardiac Rehab from 02/21/2022 in [facility] Cardiac and Pulmonary Rehab  Education need identified 02/21/22       Biometrics:  Pre Biometrics - 02/21/22 1233       Pre Biometrics   Height 5' 5.5" (1.664 m)    Weight 257 lb 1.6 oz (116.6 kg)    Waist Circumference 42 inches    Hip Circumference 52.5 inches    Waist to Hip Ratio 0.8 %    BMI (Calculated) 42.12    Single  Leg Stand 7.2 seconds              Nutrition Therapy Plan and Nutrition Goals:  Nutrition Therapy & Goals - 02/21/22 1104       Nutrition Therapy   Diet Heart healthy, low Na, CKD stg3-4 MNT, Pulmonary MNT    Drug/Food Interactions Statins/Certain Fruits    Protein (specify units) 85-90g    Fiber 28 grams    Whole Grain Foods 3 servings    Saturated Fats 16 max. grams    Fruits and Vegetables 8 servings/day    Sodium 1.5 grams      Personal Nutrition Goals   Nutrition Goal ST: practice adding in protein to eat meal. Include, but limit high potassium foods. Practice batch cooking and easy to prep meals. LT: meet protein/calorie needs, continue to limit sodium <1.5g/day, follow MyPlate guidelines    Comments 81 y.o. F admitted to cardiac rehab s/p TAVR. PMHx includes AFib, chronic combined systolic and diastolic HF, COPD, hypothyroidism, CKD st 3, HLD, anemia, HTN. Relevant medications includes lipitor, biotin, calcium-magnesium-zinc, vit D3, CoQ10, chromium, furosemide, probiotics, magnesium, MVI, K+, zinc, synthoid. Recent relevant labs: BMP: creatinine 1.5, GFR 33, potassium 4.5. TSH: 9.219. CBC: hemoglobin 11. Her food allergies includes persimmons. Juliann Pulse reports keeping a low salt diet and trying her best to choose heart healthy foods and has done so for a long time. She reports that recently her appetite has been lower which has also caused her to not be interested in cooking. She likes to make PB sandwiches on whole wheat bread, getting food from [employer] like soup and salads, cottage cheese, greek yogurt, as well as lots of fruits/vegetables. Juliann Pulse reports liking leafy greens, but reports she can't have them or needs to have a consistent amount with elliquis - discussed how that would be for coumadin/warfarin and not for elliquis; she reports she has been on warfarin previously. Reviewed general heart healthy eating and discussed CKD stg 3-4 MNT as well as pulmonary MNT. Discussed the  importance of meeting calorie and  protein needs to maintain lean muscle tissue - encouraged her to have at least one good source of protein at each meal: cottage cheese, nuts/seeds or nut/seed butter, greek yogurt, eggs, beans/lentils, fish, chicken, meat (less than 2-3x/week); if she bakes a potato she can top it with greek yogurt or smash white beans into it, she could add nuts to her cereal in the morning, or have an egg as part of a snack.  She reports that she will sometimes include ensure to help meet her nutrient needs - discussed how that can help, but to be cautious of the potassium in there and to adjust high potassium foods with what her blood work shows as she is already on potassium with her diuretic and it is encouagred to get in potassium rich foods like whole grains, beans and lentils in a heart healhty diet. Discussed ingredient prepping and batch meal prepping like chili or soup frozen in individual servings for an easy meal. Provided handouts.      Intervention Plan   Intervention Prescribe, educate and counsel regarding individualized specific dietary modifications aiming towards targeted core components such as weight, hypertension, lipid management, diabetes, heart failure and other comorbidities.;Nutrition handout(s) given to patient.    Expected Outcomes Short Term Goal: Understand basic principles of dietary content, such as calories, fat, sodium, cholesterol and nutrients.;Short Term Goal: A plan has been developed with personal nutrition goals set during dietitian appointment.;Long Term Goal: Adherence to prescribed nutrition plan.             Nutrition Assessments:  MEDIFICTS Score Key: ?70 Need to make dietary changes  40-70 Heart Healthy Diet ? 40 Therapeutic Level Cholesterol Diet  Flowsheet Row Cardiac Rehab from 02/21/2022 in [facility] Cardiac and Pulmonary Rehab  Picture Your Plate Total Score on Admission 87      Picture Your Plate Scores: <50 Unhealthy dietary  pattern with much room for improvement. 41-50 Dietary pattern unlikely to meet recommendations for good health and room for improvement. 51-60 More healthful dietary pattern, with some room for improvement.  >60 Healthy dietary pattern, although there may be some specific behaviors that could be improved.    Nutrition Goals Re-Evaluation:   Nutrition Goals Discharge (Final Nutrition Goals Re-Evaluation):   Psychosocial: Target Goals: Acknowledge presence or absence of significant depression and/or stress, maximize coping skills, provide positive support system. Participant is able to verbalize types and ability to use techniques and skills needed for reducing stress and depression.   Education: Stress, Anxiety, and Depression - Group verbal and visual presentation to define topics covered.  Reviews how body is impacted by stress, anxiety, and depression.  Also discusses healthy ways to reduce stress and to treat/manage anxiety and depression.  Written material given at graduation.   Education: Sleep Hygiene -Provides group verbal and written instruction about how sleep can affect your health.  Define sleep hygiene, discuss sleep cycles and impact of sleep habits. Review good sleep hygiene tips.    Initial Review & Psychosocial Screening:  Initial Psych Review & Screening - 02/13/22 1012       Initial Review   Current issues with Current Stress Concerns;Current Sleep Concerns    Source of Stress Concerns Chronic Illness;Unable to participate in former interests or hobbies      [place]? Yes   friends and church group     Barriers   Psychosocial barriers to participate in program There are no identifiable barriers or psychosocial needs.;The patient should benefit  from training in stress management and relaxation.      Screening Interventions   Interventions Encouraged to exercise;Provide feedback about the scores to participant;To provide support and  resources with identified psychosocial needs    Expected Outcomes Short Term goal: Utilizing psychosocial counselor, staff and physician to assist with identification of specific Stressors or current issues interfering with healing process. Setting desired goal for each stressor or current issue identified.;Long Term Goal: Stressors or current issues are controlled or eliminated.;Short Term goal: Identification and review with participant of any Quality of Life or Depression concerns found by scoring the questionnaire.;Long Term goal: The participant improves quality of Life and PHQ9 Scores as seen by post scores and/or verbalization of changes             Quality of Life Scores:   Quality of Life - 02/21/22 1234       Quality of Life   Select Quality of Life      Quality of Life Scores   Health/Function Pre 22.35 %    Socioeconomic Pre 24.43 %    Psych/Spiritual Pre 27.21 %    Family Pre --   no answers   GLOBAL Pre 24.15 %            Scores of 19 and below usually indicate a poorer quality of life in these areas.  A difference of  2-3 points is a clinically meaningful difference.  A difference of 2-3 points in the total score of the Quality of Life Index has been associated with significant improvement in overall quality of life, self-image, physical symptoms, and general health in studies assessing change in quality of life.  PHQ-9: Review Flowsheet  More data exists      02/21/2022 07/14/2021 06/02/2021 09/19/2020 10/16/2019  Depression screen PHQ 2/9  Decreased Interest 1 0 '1 1 1  '$ Down, Depressed, Hopeless 0 0 0 1 1  PHQ - 2 Score 1 0 '1 2 2  '$ Altered sleeping 2 - '3 1 2  '$ Tired, decreased energy 2 - '2 1 3  '$ Change in appetite 1 - '1 1 2  '$ Feeling bad or failure about yourself  0 - 0 0 0  Trouble concentrating 1 - 0 1 1  Moving slowly or fidgety/restless 0 - 0 0 0  Suicidal thoughts 0 - 0 0 0  PHQ-9 Score 7 - '7 6 10  '$ Difficult doing work/chores Somewhat difficult - Somewhat  difficult Not difficult at all Not difficult at all   Interpretation of Total Score  Total Score Depression Severity:  1-4 = Minimal depression, 5-9 = Mild depression, 10-14 = Moderate depression, 15-19 = Moderately severe depression, 20-27 = Severe depression   Psychosocial Evaluation and Intervention:  Psychosocial Evaluation - 02/13/22 1032       Psychosocial Evaluation & Interventions   Interventions Encouraged to exercise with the program and follow exercise prescription;Stress management education;Relaxation education    Comments 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 She quit her job last fall because of her declining health and trying to figure out what was going on. 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 She has been struggling with lymphedema with the complication of cellulitis this year as well. She prides herself on being independent and works hard to maintain  that. She has a great support system of friends and church family. She mentioned that her sleep is a struggle with taking lasix and a lot on her mind. She feels very sluggish and doesn't know if it is related to all that she has been through these last few months or a form of depression. She is very aware of her mental health and has a strong resolve to push through it. She is wanting to gain back her strength by coming here. She is excited to get started.    Expected Outcomes Short: attend cardiac rehab for education and exercise. Long: develop and maintain positive self care habits.    Continue Psychosocial Services  Follow up required by staff             Psychosocial Re-Evaluation:   Psychosocial Discharge (Final Psychosocial Re-Evaluation):   Vocational Rehabilitation: Provide vocational rehab assistance  to qualifying candidates.   Vocational Rehab Evaluation & Intervention:  Vocational Rehab - 02/13/22 1010       Initial Vocational Rehab Evaluation & Intervention   Assessment shows need for Vocational Rehabilitation No             Education: Education Goals: Education classes will be provided on a variety of topics geared toward better understanding of heart health and risk factor modification. Participant will state understanding/return demonstration of topics presented as noted by education test scores.  Learning Barriers/Preferences:  Learning Barriers/Preferences - 02/13/22 1007       Learning Barriers/Preferences   Learning Barriers Sight    Learning Preferences Individual Instruction             General Cardiac Education Topics:  AED/CPR: - Group verbal and written instruction with the use of models to demonstrate the basic use of the AED with the basic ABC's of resuscitation.   Anatomy and Cardiac Procedures: - Group verbal and visual presentation and models provide information about basic cardiac anatomy and function. Reviews the testing methods done to diagnose heart disease and the outcomes of the test results. Describes the treatment choices: Medical Management, Angioplasty, or Coronary Bypass Surgery for treating various heart conditions including Myocardial Infarction, Angina, Valve Disease, and Cardiac Arrhythmias.  Written material given at graduation. Flowsheet Row Cardiac Rehab from 02/21/2022 in [facility] Cardiac and Pulmonary Rehab  Education need identified 02/21/22       Medication Safety: - Group verbal and visual instruction to review commonly prescribed medications for heart and lung disease. Reviews the medication, class of the drug, and side effects. Includes the steps to properly store meds and maintain the prescription regimen.  Written material given at graduation.   Intimacy: - Group verbal instruction through game format to discuss how  heart and lung disease can affect sexual intimacy. Written material given at graduation..   Know Your Numbers and Heart Failure: - Group verbal and visual instruction to discuss disease risk factors for cardiac and pulmonary disease and treatment options.  Reviews associated critical values for Overweight/Obesity, Hypertension, Cholesterol, and Diabetes.  Discusses basics of heart failure: signs/symptoms and treatments.  Introduces Heart Failure Zone chart for action plan for heart failure.  Written material given at graduation.   Infection Prevention: - Provides verbal and written material to individual with discussion of infection control including proper hand washing and proper equipment cleaning during exercise session. Flowsheet Row Cardiac Rehab from 02/21/2022 in [facility] Cardiac and Pulmonary Rehab  Date 02/21/22  Educator [facility]  Instruction Review Code 1- Verbalizes Understanding  Falls Prevention: - Provides verbal and written material to individual with discussion of falls prevention and safety. Flowsheet Row Cardiac Rehab from 02/21/2022 in [facility] Cardiac and Pulmonary Rehab  Date 02/21/22  Educator [facility]  Instruction Review Code 1- Verbalizes Understanding       Other: -Provides group and verbal instruction on various topics (see comments)   Knowledge Questionnaire Score:  Knowledge Questionnaire Score - 02/21/22 1235       Knowledge Questionnaire Score   Pre Score 21/26             Core Components/Risk Factors/Patient Goals at Admission:  Personal Goals and Risk Factors at Admission - 02/21/22 1235       Core Components/Risk Factors/Patient Goals on Admission    Weight Management Yes;Weight Loss;Obesity    Intervention Weight Management: Develop a combined nutrition and exercise program designed to reach desired caloric intake, while maintaining appropriate intake of nutrient and fiber, sodium and fats, and appropriate energy expenditure required for the  weight goal.;Weight Management: Provide education and appropriate resources to help participant work on and attain dietary goals.;Weight Management/Obesity: Establish reasonable short term and long term weight goals.;Obesity: Provide education and appropriate resources to help participant work on and attain dietary goals.    Admit Weight 257 lb 1.6 oz (116.6 kg)    Goal Weight: Short Term 250 lb (113.4 kg)    Goal Weight: Long Term 240 lb (108.9 kg)    Expected Outcomes Short Term: Continue to assess and modify interventions until short term weight is achieved;Long Term: Adherence to nutrition and physical activity/exercise program aimed toward attainment of established weight goal;Weight Loss: Understanding of general recommendations for a balanced deficit meal plan, which promotes 1-2 lb weight loss per week and includes a negative energy balance of [phone] kcal/d;Understanding recommendations for meals to include 15-35% energy as protein, 25-35% energy from fat, 35-60% energy from carbohydrates, less than '200mg'$  of dietary cholesterol, 20-35 gm of total fiber daily;Understanding of distribution of calorie intake throughout the day with the consumption of 4-5 meals/snacks    Heart Failure Yes    Intervention Provide a combined exercise and nutrition program that is supplemented with education, support and counseling about heart failure. Directed toward relieving symptoms such as shortness of breath, decreased exercise tolerance, and extremity edema.    Expected Outcomes Improve functional capacity of life;Short term: Attendance in program 2-3 days a week with increased exercise capacity. Reported lower sodium intake. Reported increased fruit and vegetable intake. Reports medication compliance.;Short term: Daily weights obtained and reported for increase. Utilizing diuretic protocols set by physician.;Long term: Adoption of self-care skills and reduction of barriers for early signs and symptoms recognition  and intervention leading to self-care maintenance.    Hypertension Yes    Intervention Provide education on lifestyle modifcations including regular physical activity/exercise, weight management, moderate sodium restriction and increased consumption of fresh fruit, vegetables, and low fat dairy, alcohol moderation, and smoking cessation.;Monitor prescription use compliance.    Expected Outcomes Short Term: Continued assessment and intervention until BP is < 140/51m HG in hypertensive participants. < 130/840mHG in hypertensive participants with diabetes, heart failure or chronic kidney disease.;Long Term: Maintenance of blood pressure at goal levels.    Lipids Yes    Intervention Provide education and support for participant on nutrition & aerobic/resistive exercise along with prescribed medications to achieve LDL '70mg'$ , HDL >'40mg'$ .    Expected Outcomes Short Term: Participant states understanding of desired cholesterol values and is compliant with medications prescribed. Participant is  following exercise prescription and nutrition guidelines.;Long Term: Cholesterol controlled with medications as prescribed, with individualized exercise RX and with personalized nutrition plan. Value goals: LDL < '70mg'$ , HDL > 40 mg.             Education:Diabetes - Individual verbal and written instruction to review signs/symptoms of diabetes, desired ranges of glucose level fasting, after meals and with exercise. Acknowledge that pre and post exercise glucose checks will be done for 3 sessions at entry of program.   Core Components/Risk Factors/Patient Goals Review:    Core Components/Risk Factors/Patient Goals at Discharge (Final Review):    ITP Comments:  ITP Comments     Row Name 02/13/22 1003 02/21/22 1229         ITP Comments Initial phone call completed. Diagnosis can be found in [facility] 7/24. EP Orientation scheduled for Wednesday 10/11 at 9am. Completed 6MWT and gym orientation. Initial ITP created  and sent for review to Dr. Emily Filbert, Medical Director.               Comments: Initial ITP

## 2022-02-21 NOTE — Patient Instructions (Signed)
Patient Instructions  Patient Details  Name: Mary Kennedy MRN: [id number] Date of Birth: 10-11-1940 Referring Provider:  Yolonda Kida, MD  Below are your personal goals for exercise, nutrition, and risk factors. Our goal is to help you stay on track towards obtaining and maintaining these goals. We will be discussing your progress on these goals with you throughout the program.  Initial Exercise Prescription:  Initial Exercise Prescription - 02/21/22 1200       Date of Initial Exercise RX and Referring Provider   Date 02/21/22    Referring Provider Katrine Coho MD      Oxygen   Maintain Oxygen Saturation 88% or higher      Treadmill   MPH 1.3    Grade 0    Minutes 15    METs 2      Recumbant Elliptical   Level 1    RPM 50    Minutes 15    METs 2      REL-XR   Level 1    Speed 50    Minutes 15    METs 2      Track   Laps 20    Minutes 15    METs 2.09      Prescription Details   Frequency (times per week) 3    Duration Progress to 30 minutes of continuous aerobic without signs/symptoms of physical distress      Intensity   THRR 40-80% of Max Heartrate 99-126    Ratings of Perceived Exertion 11-13    Perceived Dyspnea 0-4      Progression   Progression Continue to progress workloads to maintain intensity without signs/symptoms of physical distress.      Resistance Training   Training Prescription Yes    Weight 3 lb    Reps 10-15             Exercise Goals: Frequency: Be able to perform aerobic exercise two to three times per week in program working toward 2-5 days per week of home exercise.  Intensity: Work with a perceived exertion of 11 (fairly light) - 15 (hard) while following your exercise prescription.  We will make changes to your prescription with you as you progress through the program.   Duration: Be able to do 30 to 45 minutes of continuous aerobic exercise in addition to a 5 minute warm-up and a 5 minute cool-down  routine.   Nutrition Goals: Your personal nutrition goals will be established when you do your nutrition analysis with the dietician.  The following are general nutrition guidelines to follow: Cholesterol < '200mg'$ /day Sodium < '1500mg'$ /day Fiber: Women over 50 yrs - 21 grams per day  Personal Goals:  Personal Goals and Risk Factors at Admission - 02/21/22 1235       Core Components/Risk Factors/Patient Goals on Admission    Weight Management Yes;Weight Loss;Obesity    Intervention Weight Management: Develop a combined nutrition and exercise program designed to reach desired caloric intake, while maintaining appropriate intake of nutrient and fiber, sodium and fats, and appropriate energy expenditure required for the weight goal.;Weight Management: Provide education and appropriate resources to help participant work on and attain dietary goals.;Weight Management/Obesity: Establish reasonable short term and long term weight goals.;Obesity: Provide education and appropriate resources to help participant work on and attain dietary goals.    Admit Weight 257 lb 1.6 oz (116.6 kg)    Goal Weight: Short Term 250 lb (113.4 kg)    Goal Weight: Long Term  240 lb (108.9 kg)    Expected Outcomes Short Term: Continue to assess and modify interventions until short term weight is achieved;Long Term: Adherence to nutrition and physical activity/exercise program aimed toward attainment of established weight goal;Weight Loss: Understanding of general recommendations for a balanced deficit meal plan, which promotes 1-2 lb weight loss per week and includes a negative energy balance of [phone] kcal/d;Understanding recommendations for meals to include 15-35% energy as protein, 25-35% energy from fat, 35-60% energy from carbohydrates, less than '200mg'$  of dietary cholesterol, 20-35 gm of total fiber daily;Understanding of distribution of calorie intake throughout the day with the consumption of 4-5 meals/snacks    Heart  Failure Yes    Intervention Provide a combined exercise and nutrition program that is supplemented with education, support and counseling about heart failure. Directed toward relieving symptoms such as shortness of breath, decreased exercise tolerance, and extremity edema.    Expected Outcomes Improve functional capacity of life;Short term: Attendance in program 2-3 days a week with increased exercise capacity. Reported lower sodium intake. Reported increased fruit and vegetable intake. Reports medication compliance.;Short term: Daily weights obtained and reported for increase. Utilizing diuretic protocols set by physician.;Long term: Adoption of self-care skills and reduction of barriers for early signs and symptoms recognition and intervention leading to self-care maintenance.    Hypertension Yes    Intervention Provide education on lifestyle modifcations including regular physical activity/exercise, weight management, moderate sodium restriction and increased consumption of fresh fruit, vegetables, and low fat dairy, alcohol moderation, and smoking cessation.;Monitor prescription use compliance.    Expected Outcomes Short Term: Continued assessment and intervention until BP is < 140/41m HG in hypertensive participants. < 130/881mHG in hypertensive participants with diabetes, heart failure or chronic kidney disease.;Long Term: Maintenance of blood pressure at goal levels.    Lipids Yes    Intervention Provide education and support for participant on nutrition & aerobic/resistive exercise along with prescribed medications to achieve LDL '70mg'$ , HDL >'40mg'$ .    Expected Outcomes Short Term: Participant states understanding of desired cholesterol values and is compliant with medications prescribed. Participant is following exercise prescription and nutrition guidelines.;Long Term: Cholesterol controlled with medications as prescribed, with individualized exercise RX and with personalized nutrition plan. Value  goals: LDL < '70mg'$ , HDL > 40 mg.             Tobacco Use Initial Evaluation: Social History   Tobacco Use  Smoking Status Former   Types: Cigarettes   Quit date: 05/13/1996   Years since quitting: 25.7  Smokeless Tobacco Never    Exercise Goals and Review:  Exercise Goals     Row Name 02/21/22 1233             Exercise Goals   Increase Physical Activity Yes       Intervention Provide advice, education, support and counseling about physical activity/exercise needs.;Develop an individualized exercise prescription for aerobic and resistive training based on initial evaluation findings, risk stratification, comorbidities and participant's personal goals.       Expected Outcomes Short Term: Attend rehab on a regular basis to increase amount of physical activity.;Long Term: Add in home exercise to make exercise part of routine and to increase amount of physical activity.;Long Term: Exercising regularly at least 3-5 days a week.       Increase Strength and Stamina Yes       Intervention Provide advice, education, support and counseling about physical activity/exercise needs.;Develop an individualized exercise prescription for aerobic and resistive training based on  initial evaluation findings, risk stratification, comorbidities and participant's personal goals.       Expected Outcomes Short Term: Increase workloads from initial exercise prescription for resistance, speed, and METs.;Short Term: Perform resistance training exercises routinely during rehab and add in resistance training at home;Long Term: Improve cardiorespiratory fitness, muscular endurance and strength as measured by increased METs and functional capacity (6MWT)       Able to understand and use rate of perceived exertion (RPE) scale Yes       Intervention Provide education and explanation on how to use RPE scale       Expected Outcomes Short Term: Able to use RPE daily in rehab to express subjective intensity level;Long  Term:  Able to use RPE to guide intensity level when exercising independently       Able to understand and use Dyspnea scale Yes       Intervention Provide education and explanation on how to use Dyspnea scale       Expected Outcomes Short Term: Able to use Dyspnea scale daily in rehab to express subjective sense of shortness of breath during exertion;Long Term: Able to use Dyspnea scale to guide intensity level when exercising independently       Knowledge and understanding of Target Heart Rate Range (THRR) Yes       Intervention Provide education and explanation of THRR including how the numbers were predicted and where they are located for reference       Expected Outcomes Short Term: Able to state/look up THRR;Long Term: Able to use THRR to govern intensity when exercising independently;Short Term: Able to use daily as guideline for intensity in rehab       Able to check pulse independently Yes       Intervention Provide education and demonstration on how to check pulse in carotid and radial arteries.;Review the importance of being able to check your own pulse for safety during independent exercise       Expected Outcomes Short Term: Able to explain why pulse checking is important during independent exercise;Long Term: Able to check pulse independently and accurately       Understanding of Exercise Prescription Yes       Intervention Provide education, explanation, and written materials on patient's individual exercise prescription       Expected Outcomes Short Term: Able to explain program exercise prescription;Long Term: Able to explain home exercise prescription to exercise independently                Copy of goals given to participant.

## 2022-02-23 ENCOUNTER — Encounter: Payer: Medicare Other | Admitting: *Deleted

## 2022-02-23 DIAGNOSIS — Z952 Presence of prosthetic heart valve: Secondary | ICD-10-CM

## 2022-02-23 NOTE — Progress Notes (Signed)
Daily Session Note  Patient Details  Name: Mary Kennedy MRN: [id number] Date of Birth: 1940/12/10 Referring Provider:   Flowsheet Row Cardiac Rehab from 02/21/2022 in [facility] Cardiac and Pulmonary Rehab  Referring Provider Katrine Coho MD       Encounter Date: 02/23/2022  Check In:  Session Check In - 02/23/22 1117       Check-In   Supervising physician immediately available to respond to emergencies See telemetry face sheet for immediately available ER MD    Location [facility]-Cardiac & Pulmonary Rehab    Staff Present Nyoka Cowden, RN, BSN, Fenton Foy, BS, Exercise Physiologist;Susanne Bice, RN, BSN, CCRP    Virtual Visit No    Medication changes reported     No    Fall or balance concerns reported    No    Tobacco Cessation No Change    Warm-up and Cool-down Performed on first and last piece of equipment    Resistance Training Performed Yes    VAD Patient? No    PAD/SET Patient? No      Pain Assessment   Currently in Pain? No/denies                Social History   Tobacco Use  Smoking Status Former   Types: Cigarettes   Quit date: 05/13/1996   Years since quitting: 25.8  Smokeless Tobacco Never    Goals Met:  Independence with exercise equipment Exercise tolerated well No report of concerns or symptoms today  Goals Unmet:  Not Applicable  Comments: Pt able to follow exercise prescription today without complaint.  Will continue to monitor for progression.    Dr. Emily Filbert is Medical Director for [place].  Dr. Ottie Glazier is Medical Director for [facility] Pulmonary Rehabilitation.

## 2022-02-27 ENCOUNTER — Encounter: Payer: Medicare Other | Admitting: *Deleted

## 2022-02-27 DIAGNOSIS — Z952 Presence of prosthetic heart valve: Secondary | ICD-10-CM

## 2022-02-27 NOTE — Progress Notes (Signed)
Daily Session Note  Patient Details  Name: Mary Kennedy MRN: [id number] Date of Birth: 22-Jun-1940 Referring Provider:   Flowsheet Row Cardiac Rehab from 02/21/2022 in [facility] Cardiac and Pulmonary Rehab  Referring Provider Katrine Coho MD       Encounter Date: 02/27/2022  Check In:  Session Check In - 02/27/22 1118       Check-In   Supervising physician immediately available to respond to emergencies See telemetry face sheet for immediately available ER MD    Location [facility]-Cardiac & Pulmonary Rehab    Staff Present Coralie Keens, MS, ASCM CEP, Exercise Physiologist;Melissa Barlow, RDN, LDN;Jessica Luan Pulling, MA, RCEP, CCRP, CCET;Mickelle Goupil Sherryll Burger, RN BSN    Virtual Visit No    Medication changes reported     No    Fall or balance concerns reported    No    Tobacco Cessation No Change    Warm-up and Cool-down Performed on first and last piece of equipment    Resistance Training Performed Yes    VAD Patient? No    PAD/SET Patient? No      Pain Assessment   Currently in Pain? No/denies                Social History   Tobacco Use  Smoking Status Former   Types: Cigarettes   Quit date: 05/13/1996   Years since quitting: 25.8  Smokeless Tobacco Never    Goals Met:  Independence with exercise equipment Exercise tolerated well No report of concerns or symptoms today Strength training completed today  Goals Unmet:  Not Applicable  Comments: Pt able to follow exercise prescription today without complaint.  Will continue to monitor for progression.    Dr. Emily Filbert is Medical Director for [place].  Dr. Ottie Glazier is Medical Director for [facility] Pulmonary Rehabilitation.

## 2022-02-28 ENCOUNTER — Encounter: Payer: Medicare Other | Admitting: *Deleted

## 2022-02-28 DIAGNOSIS — Z952 Presence of prosthetic heart valve: Secondary | ICD-10-CM

## 2022-02-28 NOTE — Progress Notes (Signed)
Daily Session Note  Patient Details  Name: Mary Kennedy MRN: [id number] Date of Birth: May 27, 1940 Referring Provider:   Flowsheet Row Cardiac Rehab from 02/21/2022 in [facility] Cardiac and Pulmonary Rehab  Referring Provider Katrine Coho MD       Encounter Date: 02/28/2022  Check In:  Session Check In - 02/28/22 1110       Check-In   Supervising physician immediately available to respond to emergencies See telemetry face sheet for immediately available ER MD    Location [facility]-Cardiac & Pulmonary Rehab    Staff Present Antionette Fairy, BS, Exercise Physiologist;Meredith Sherryll Burger, RN BSN;Jessica Luan Pulling, MA, RCEP, CCRP, Mindi Curling, RN, [place]    Virtual Visit No    Medication changes reported     No    Fall or balance concerns reported    No    Warm-up and Cool-down Performed on first and last piece of equipment    Resistance Training Performed Yes    VAD Patient? No    PAD/SET Patient? No      Pain Assessment   Currently in Pain? No/denies                Social History   Tobacco Use  Smoking Status Former   Types: Cigarettes   Quit date: 05/13/1996   Years since quitting: 25.8  Smokeless Tobacco Never    Goals Met:  Independence with exercise equipment Exercise tolerated well No report of concerns or symptoms today Strength training completed today  Goals Unmet:  Not Applicable  Comments: Pt able to follow exercise prescription today without complaint.  Will continue to monitor for progression.    Dr. Emily Filbert is Medical Director for [place].  Dr. Ottie Glazier is Medical Director for [facility] Pulmonary Rehabilitation.

## 2022-03-02 ENCOUNTER — Encounter: Payer: Medicare Other | Admitting: *Deleted

## 2022-03-02 DIAGNOSIS — Z952 Presence of prosthetic heart valve: Secondary | ICD-10-CM | POA: Diagnosis not present

## 2022-03-02 NOTE — Progress Notes (Signed)
Daily Session Note  Patient Details  Name: Mary Kennedy MRN: [id number] Date of Birth: 1940/06/03 Referring Provider:   Flowsheet Row Cardiac Rehab from 02/21/2022 in [facility] Cardiac and Pulmonary Rehab  Referring Provider Katrine Coho MD       Encounter Date: 03/02/2022  Check In:  Session Check In - 03/02/22 1139       Check-In   Supervising physician immediately available to respond to emergencies See telemetry face sheet for immediately available ER MD    Location [facility]-Cardiac & Pulmonary Rehab    Staff Present Heath Lark, RN, BSN, CCRP;Jessica Hamlin, MA, RCEP, CCRP, CCET;Joseph [place], [place]    Virtual Visit No    Medication changes reported     No    Fall or balance concerns reported    No    Warm-up and Cool-down Performed on first and last piece of equipment    Resistance Training Performed Yes    VAD Patient? No    PAD/SET Patient? No      Pain Assessment   Currently in Pain? No/denies                Social History   Tobacco Use  Smoking Status Former   Types: Cigarettes   Quit date: 05/13/1996   Years since quitting: 25.8  Smokeless Tobacco Never    Goals Met:  Independence with exercise equipment Exercise tolerated well No report of concerns or symptoms today  Goals Unmet:  Not Applicable  Comments: Pt able to follow exercise prescription today without complaint.  Will continue to monitor for progression.    Dr. Emily Filbert is Medical Director for [place].  Dr. Ottie Glazier is Medical Director for [facility] Pulmonary Rehabilitation.

## 2022-03-05 ENCOUNTER — Encounter: Payer: Medicare Other | Admitting: *Deleted

## 2022-03-05 DIAGNOSIS — Z952 Presence of prosthetic heart valve: Secondary | ICD-10-CM

## 2022-03-05 NOTE — Progress Notes (Signed)
Daily Session Note  Patient Details  Name: Mary Kennedy MRN: [id number] Date of Birth: 07-09-1940 Referring Provider:   Flowsheet Row Cardiac Rehab from 02/21/2022 in [facility] Cardiac and Pulmonary Rehab  Referring Provider Katrine Coho MD       Encounter Date: 03/05/2022  Check In:  Session Check In - 03/05/22 1121       Check-In   Supervising physician immediately available to respond to emergencies See telemetry face sheet for immediately available ER MD    Location [facility]-Cardiac & Pulmonary Rehab    Staff Present Antionette Fairy, BS, Exercise Physiologist;Kelly Amedeo Plenty, BS, ACSM CEP, Exercise Physiologist;Jessica [place], MA, RCEP, CCRP, Mindi Curling, RN, [place]    Virtual Visit No    Medication changes reported     No    Fall or balance concerns reported    No    Warm-up and Cool-down Performed on first and last piece of equipment    Resistance Training Performed Yes    VAD Patient? No    PAD/SET Patient? No      Pain Assessment   Currently in Pain? No/denies                Social History   Tobacco Use  Smoking Status Former   Types: Cigarettes   Quit date: 05/13/1996   Years since quitting: 25.8  Smokeless Tobacco Never    Goals Met:  Independence with exercise equipment Exercise tolerated well No report of concerns or symptoms today Strength training completed today  Goals Unmet:  Not Applicable  Comments: Pt able to follow exercise prescription today without complaint.  Will continue to monitor for progression.    Dr. Emily Filbert is Medical Director for [facility].  Dr. Ottie Glazier is Medical Director for [facility] Pulmonary Rehabilitation.

## 2022-03-07 ENCOUNTER — Encounter: Payer: Medicare Other | Admitting: *Deleted

## 2022-03-07 DIAGNOSIS — Z952 Presence of prosthetic heart valve: Secondary | ICD-10-CM | POA: Diagnosis not present

## 2022-03-07 NOTE — Progress Notes (Signed)
Daily Session Note  Patient Details  Name: Mary Kennedy MRN: [id number] Date of Birth: 1940/10/06 Referring Provider:   Flowsheet Row Cardiac Rehab from 02/21/2022 in [facility] Cardiac [facility] Pulmonary Rehab  Referring Provider Katrine Coho MD       Encounter Date: 03/07/2022  Check In:  Session Check In - 03/07/22 1132       Check-In   Supervising physician immediately available to respond to emergencies See telemetry face sheet for immediately available ER MD    Location [facility]-Cardiac & Pulmonary Rehab    Staff Present Renita Papa, RN BSN;Joseph Tessie Fass, RCP,RRT,BSRT;Terreon Ekholm Tamala Julian, RN, Dimple Nanas, BS, Exercise Physiologist    Virtual Visit No    Medication changes reported     No    Fall or balance concerns reported    No    Warm-up and Cool-down Performed on first and last piece of equipment    Resistance Training Performed Yes    VAD Patient? No    PAD/SET Patient? No      Pain Assessment   Currently in Pain? No/denies                Social History   Tobacco Use  Smoking Status Former   Types: Cigarettes   Quit date: 05/13/1996   Years since quitting: 25.8  Smokeless Tobacco Never    Goals Met:  Independence with exercise equipment Exercise tolerated well No report of concerns or symptoms today Strength training completed today  Goals Unmet:  Not Applicable  Comments: Pt able to follow exercise prescription today without complaint.  Will continue to monitor for progression.    Dr. Emily Filbert is Medical Director for [place].  Dr. Ottie Glazier is Medical Director for [facility] Pulmonary Rehabilitation.

## 2022-03-09 ENCOUNTER — Encounter: Payer: Medicare Other | Admitting: *Deleted

## 2022-03-09 DIAGNOSIS — Z952 Presence of prosthetic heart valve: Secondary | ICD-10-CM

## 2022-03-09 NOTE — Progress Notes (Signed)
Daily Session Note  Patient Details  Name: Mary Kennedy MRN: [id number] Date of Birth: 02/17/1941 Referring Provider:   Flowsheet Row Cardiac Rehab from 02/21/2022 in [facility] Cardiac and Pulmonary Rehab  Referring Provider Callwood, Dwyane MD       Encounter Date: 03/09/2022  Check In:  Session Check In - 03/09/22 1155       Check-In   Supervising physician immediately available to respond to emergencies See telemetry face sheet for immediately available ER MD    Location [facility]-Cardiac & Pulmonary Rehab    Staff Present Susanne Bice, RN, BSN, CCRP;Jessica Hawkins, MA, RCEP, CCRP, CCET;Joseph Hood, RCP,RRT,BSRT    Virtual Visit No    Medication changes reported     No    Fall or balance concerns reported    No    Warm-up and Cool-down Performed on first and last piece of equipment    Resistance Training Performed Yes    VAD Patient? No    PAD/SET Patient? No      Pain Assessment   Currently in Pain? No/denies                Social History   Tobacco Use  Smoking Status Former   Types: Cigarettes   Quit date: 05/13/1996   Years since quitting: 25.8  Smokeless Tobacco Never    Goals Met:  Independence with exercise equipment Exercise tolerated well No report of concerns or symptoms today  Goals Unmet:  Not Applicable  Comments: Pt able to follow exercise prescription today without complaint.  Will continue to monitor for progression.    Dr. Mark Miller is Medical Director for [facility].  Dr. Fuad Aleskerov is Medical Director for [facility] Pulmonary Rehabilitation. 

## 2022-03-12 ENCOUNTER — Encounter (INDEPENDENT_AMBULATORY_CARE_PROVIDER_SITE_OTHER): Payer: Self-pay

## 2022-03-13 ENCOUNTER — Encounter: Payer: Medicare Other | Admitting: *Deleted

## 2022-03-13 DIAGNOSIS — Z952 Presence of prosthetic heart valve: Secondary | ICD-10-CM

## 2022-03-13 NOTE — Progress Notes (Signed)
Daily Session Note  Patient Details  Name: Mary Kennedy MRN: [id number] Date of Birth: Mar 06, 1941 Referring Provider:   Flowsheet Row Cardiac Rehab from 02/21/2022 in [facility] Cardiac and Pulmonary Rehab  Referring Provider Katrine Coho MD       Encounter Date: 03/13/2022  Check In:  Session Check In - 03/13/22 1108       Check-In   Supervising physician immediately available to respond to emergencies See telemetry face sheet for immediately available ER MD    Location [facility]-Cardiac & Pulmonary Rehab    Staff Present Alberteen Sam, MA, RCEP, CCRP, Marylynn Pearson, MS, ASCM CEP, Exercise Physiologist;Noah Tickle, BS, Exercise Physiologist;Deloise Marchant Sherryll Burger, RN BSN    Virtual Visit No    Medication changes reported     No    Fall or balance concerns reported    No    Warm-up and Cool-down Performed on first and last piece of equipment    Resistance Training Performed Yes    VAD Patient? No    PAD/SET Patient? No      Pain Assessment   Currently in Pain? No/denies                Social History   Tobacco Use  Smoking Status Former   Types: Cigarettes   Quit date: 05/13/1996   Years since quitting: 25.8  Smokeless Tobacco Never    Goals Met:  Independence with exercise equipment Exercise tolerated well No report of concerns or symptoms today Strength training completed today  Goals Unmet:  Not Applicable  Comments: Pt able to follow exercise prescription today without complaint.  Will continue to monitor for progression.    Dr. Emily Filbert is Medical Director for [place].  Dr. Ottie Glazier is Medical Director for [facility] Pulmonary Rehabilitation.

## 2022-03-14 ENCOUNTER — Encounter: Payer: Self-pay | Admitting: *Deleted

## 2022-03-14 ENCOUNTER — Encounter: Payer: Medicare Other | Attending: Internal Medicine | Admitting: *Deleted

## 2022-03-14 DIAGNOSIS — Z952 Presence of prosthetic heart valve: Secondary | ICD-10-CM

## 2022-03-14 NOTE — Progress Notes (Signed)
Daily Session Note  Patient Details  Name: Mary Kennedy MRN: [id number] Date of Birth: 11/18/1940 Referring Provider:   Flowsheet Row Cardiac Rehab from 02/21/2022 in [facility] Cardiac and Pulmonary Rehab  Referring Provider Katrine Coho MD       Encounter Date: 03/14/2022  Check In:  Session Check In - 03/14/22 1116       Check-In   Supervising physician immediately available to respond to emergencies See telemetry face sheet for immediately available ER MD    Location [facility]-Cardiac & Pulmonary Rehab    Staff Present Antionette Fairy, BS, Exercise Physiologist;Meredith Sherryll Burger, RN BSN;Joseph Rosebud Poles, RN, [place]    Virtual Visit No    Medication changes reported     No    Fall or balance concerns reported    No    Warm-up and Cool-down Performed on first and last piece of equipment    Resistance Training Performed Yes    VAD Patient? No    PAD/SET Patient? No      Pain Assessment   Currently in Pain? No/denies                Social History   Tobacco Use  Smoking Status Former   Types: Cigarettes   Quit date: 05/13/1996   Years since quitting: 25.8  Smokeless Tobacco Never    Goals Met:  Independence with exercise equipment Exercise tolerated well No report of concerns or symptoms today Strength training completed today  Goals Unmet:  Not Applicable  Comments: Pt able to follow exercise prescription today without complaint.  Will continue to monitor for progression.    Dr. Emily Filbert is Medical Director for [place].  Dr. Ottie Glazier is Medical Director for [facility] Pulmonary Rehabilitation.

## 2022-03-14 NOTE — Progress Notes (Signed)
Cardiac Individual Treatment Plan  Patient Details  Name: Mary Kennedy MRN: [id number] Date of Birth: [birth year] Referring Provider:   Flowsheet Row Cardiac Rehab from 02/21/2022 in [facility] Cardiac and Pulmonary Rehab  Referring Provider Mary Coho MD       Initial Encounter Date:  Flowsheet Row Cardiac Rehab from 02/21/2022 in [facility] Cardiac and Pulmonary Rehab  Date 02/21/22       Visit Diagnosis: S/P TAVR (transcatheter aortic valve replacement)  Patient's Home Medications on Admission:  Current Outpatient Medications:    amiodarone (PACERONE) 200 MG tablet, Take 100 mg by mouth daily., Disp: , Rfl:    amLODipine (NORVASC) 10 MG tablet, Take by mouth., Disp: , Rfl:    apixaban (ELIQUIS) 2.5 MG TABS tablet, Take 2.5 mg by mouth 2 (two) times daily., Disp: , Rfl:    atorvastatin (LIPITOR) 20 MG tablet, Take 20 mg by mouth daily., Disp: , Rfl:    Biotin 1 MG CAPS, Take 1 capsule by mouth daily., Disp: , Rfl:    CALCIUM-MAGNESUIUM-ZINC 333-133-8.3 MG TABS, Take 2 tablets by mouth at bedtime., Disp: , Rfl:    Cholecalciferol (D3-1000) 25 MCG (1000 UT) capsule, Take 1,000 Units by mouth daily., Disp: , Rfl:    Chromium Picolinate 1000 MCG TABS, Take 1 tablet by mouth daily., Disp: , Rfl:    Coenzyme Q10 (CO Q 10 PO), Take 1 tablet by mouth daily., Disp: , Rfl:    furosemide (LASIX) 40 MG tablet, Take 1 tablet by mouth 2 (two) times daily., Disp: , Rfl:    LACTOBACILLUS PO, Take 1 capsule by mouth daily., Disp: , Rfl:    levothyroxine (SYNTHROID) 125 MCG tablet, Take 125 mcg by mouth daily before breakfast., Disp: , Rfl:    MAGNESIUM MALATE PO, Take 1,000 mg by mouth at bedtime., Disp: , Rfl:    metoprolol succinate (TOPROL-XL) 50 MG 24 hr tablet, Take 50 mg by mouth daily., Disp: , Rfl:    Multiple Vitamins-Minerals (MULTIVITAMIN MEN 50+ PO), Take 1 tablet by mouth daily., Disp: , Rfl:    potassium chloride (KLOR-CON) 20 MEQ packet, Take 20 mEq by mouth daily., Disp: , Rfl:     tiotropium (SPIRIVA HANDIHALER) 18 MCG inhalation capsule, Place into inhaler and inhale., Disp: , Rfl:    triamcinolone ointment (KENALOG) 0.1 %, Apply 1 Application topically 2 (two) times daily., Disp: , Rfl:    zinc gluconate 50 MG tablet, Take 50 mg by mouth daily., Disp: , Rfl:   Past Medical History: Past Medical History:  Diagnosis Date   Allergy    Aortic stenosis    Atrial fibrillation ([place])    Atypical squamous cells of undetermined significance on cytologic smear of vagina (ASC-US) 12/01/2014   s/p gynecology consultation 07/2010; s/p repeat pap smear with HPV.    Cellulitis    Heart murmur    Hyperlipidemia    Hypertension    Hypothyroidism    Thyroid disease    Wears dentures    partial lower, full upper    Tobacco Use: Social History   Tobacco Use  Smoking Status Former   Types: Cigarettes   Quit date: 05/13/1996   Years since quitting: 25.8  Smokeless Tobacco Never    Labs: Review Flowsheet  More data exists      Latest Ref Rng & Units 06/23/2018 10/19/2019 09/21/2020 06/05/2021 12/09/2021  Labs for ITP Cardiac and Pulmonary Rehab  Cholestrol 100 - 199 mg/dL 173  163  157  155  -  LDL (  calc) 0 - 99 mg/dL 110  102  95  96  -  HDL-C >39 mg/dL 44  41  46  45  -  Trlycerides 0 - 149 mg/dL 97  108  85  69  -  Hemoglobin A1c 4.8 - 5.6 % 6.0  5.5  - 5.4  -  Bicarbonate 20.0 - 28.0 mmol/L - - - - 30.9   O2 Saturation % - - - - 90.1      Exercise Target Goals: Exercise Program Goal: Individual exercise prescription set using results from initial 6 min walk test and THRR while considering  patient's activity barriers and safety.   Exercise Prescription Goal: Initial exercise prescription builds to 30-45 minutes a day of aerobic activity, 2-3 days per week.  Home exercise guidelines will be given to patient during program as part of exercise prescription that the participant will acknowledge.   Education: Aerobic Exercise: - Group verbal and visual  presentation on the components of exercise prescription. Introduces F.I.T.T principle from ACSM for exercise prescriptions.  Reviews F.I.T.T. principles of aerobic exercise including progression. Written material given at graduation. Flowsheet Row Cardiac Rehab from 03/07/2022 in [facility] Cardiac and Pulmonary Rehab  Education need identified 02/21/22       Education: Resistance Exercise: - Group verbal and visual presentation on the components of exercise prescription. Introduces F.I.T.T principle from ACSM for exercise prescriptions  Reviews F.I.T.T. principles of resistance exercise including progression. Written material given at graduation.    Education: Exercise & Equipment Safety: - Individual verbal instruction and demonstration of equipment use and safety with use of the equipment. Flowsheet Row Cardiac Rehab from 03/07/2022 in [facility] Cardiac and Pulmonary Rehab  Date 02/21/22  Educator [facility]  Instruction Review Code 1- Verbalizes Understanding       Education: Exercise Physiology & General Exercise Guidelines: - Group verbal and written instruction with models to review the exercise physiology of the cardiovascular system and associated critical values. Provides general exercise guidelines with specific guidelines to those with heart or lung disease.    Education: Flexibility, Balance, Mind/Body Relaxation: - Group verbal and visual presentation with interactive activity on the components of exercise prescription. Introduces F.I.T.T principle from ACSM for exercise prescriptions. Reviews F.I.T.T. principles of flexibility and balance exercise training including progression. Also discusses the mind body connection.  Reviews various relaxation techniques to help reduce and manage stress (i.e. Deep breathing, progressive muscle relaxation, and visualization). Balance handout provided to take home. Written material given at graduation.   Activity Barriers & Risk Stratification:  Activity  Barriers & Cardiac Risk Stratification - 02/21/22 1230       Activity Barriers & Cardiac Risk Stratification   Activity Barriers Joint Problems;Arthritis;Deconditioning;Balance Concerns;Shortness of Breath;Decreased Ventricular Function    Cardiac Risk Stratification High             6 Minute Walk:  6 Minute Walk     Row Name 02/21/22 1229         6 Minute Walk   Phase Initial     Distance 790 feet     Walk Time 5.3 minutes     # of Rest Breaks 2  22 sec, 20 sec     MPH 1.69     METS 1.16     RPE 15     Perceived Dyspnea  4     VO2 Peak 4.05     Symptoms Yes (comment)     Comments SOB, bilateral knee pain 4/10  Resting HR 71 bpm     Resting BP 126/74     Resting Oxygen Saturation  97 %     Exercise Oxygen Saturation  during 6 min walk 97 %     Max Ex. HR 109 bpm     Max Ex. BP 186/84     2 Minute Post BP 164/80              Oxygen Initial Assessment:   Oxygen Re-Evaluation:   Oxygen Discharge (Final Oxygen Re-Evaluation):   Initial Exercise Prescription:  Initial Exercise Prescription - 02/21/22 1200       Date of Initial Exercise RX and Referring Provider   Date 02/21/22    Referring Provider Mary Coho MD      Oxygen   Maintain Oxygen Saturation 88% or higher      Treadmill   MPH 1.3    Grade 0    Minutes 15    METs 2      Recumbant Elliptical   Level 1    RPM 50    Minutes 15    METs 2      REL-XR   Level 1    Speed 50    Minutes 15    METs 2      Track   Laps 20    Minutes 15    METs 2.09      Prescription Details   Frequency (times per week) 3    Duration Progress to 30 minutes of continuous aerobic without signs/symptoms of physical distress      Intensity   THRR 40-80% of Max Heartrate 99-126    Ratings of Perceived Exertion 11-13    Perceived Dyspnea 0-4      Progression   Progression Continue to progress workloads to maintain intensity without signs/symptoms of physical distress.      Resistance  Training   Training Prescription Yes    Weight 3 lb    Reps 10-15             Perform Capillary Blood Glucose checks as needed.  Exercise Prescription Changes:   Exercise Prescription Changes     Row Name 02/21/22 1200 03/13/22 1500           Response to Exercise   Blood Pressure (Admit) 126/74 122/66      Blood Pressure (Exercise) 186/84 136/72      Blood Pressure (Exit) 164/80 122/60      Heart Rate (Admit) 71 bpm 70 bpm      Heart Rate (Exercise) 109 bpm 120 bpm      Heart Rate (Exit) 71 bpm 81 bpm      Oxygen Saturation (Admit) 97 % --      Oxygen Saturation (Exercise) 97 % --      Rating of Perceived Exertion (Exercise) 15 14      Perceived Dyspnea (Exercise) 4 --      Symptoms SOB, bilateral knee pain 4/10 none      Comments walk test results 2nd full week of exercise      Duration -- Progress to 30 minutes of  aerobic without signs/symptoms of physical distress      Intensity -- THRR unchanged        Progression   Progression -- Continue to progress workloads to maintain intensity without signs/symptoms of physical distress.      Average METs -- 2.18        Resistance Training   Training Prescription -- Yes  Weight -- 3 lb      Reps -- 10-15        Interval Training   Interval Training -- No        Treadmill   MPH -- 1.7      Grade -- 0      Minutes -- 15      METs -- 2.3        Recumbant Bike   Level -- 3      Watts -- 20      Minutes -- 15      METs -- 2.55        Recumbant Elliptical   Level -- 1      Minutes -- 15      METs -- 1.6        REL-XR   Level -- 1      Minutes -- 15      METs -- 1.5        T5 Nustep   Level -- 2      Minutes -- 15      METs -- 1.8        Track   Laps -- 16      Minutes -- 15      METs -- 1.87        Oxygen   Maintain Oxygen Saturation -- 88% or higher               Exercise Comments:   Exercise Goals and Review:   Exercise Goals     Row Name 02/21/22 1233              Exercise Goals   Increase Physical Activity Yes       Intervention Provide advice, education, support and counseling about physical activity/exercise needs.;Develop an individualized exercise prescription for aerobic and resistive training based on initial evaluation findings, risk stratification, comorbidities and participant's personal goals.       Expected Outcomes Short Term: Attend rehab on a regular basis to increase amount of physical activity.;Long Term: Add in home exercise to make exercise part of routine and to increase amount of physical activity.;Long Term: Exercising regularly at least 3-5 days a week.       Increase Strength and Stamina Yes       Intervention Provide advice, education, support and counseling about physical activity/exercise needs.;Develop an individualized exercise prescription for aerobic and resistive training based on initial evaluation findings, risk stratification, comorbidities and participant's personal goals.       Expected Outcomes Short Term: Increase workloads from initial exercise prescription for resistance, speed, and METs.;Short Term: Perform resistance training exercises routinely during rehab and add in resistance training at home;Long Term: Improve cardiorespiratory fitness, muscular endurance and strength as measured by increased METs and functional capacity (6MWT)       Able to understand and use rate of perceived exertion (RPE) scale Yes       Intervention Provide education and explanation on how to use RPE scale       Expected Outcomes Short Term: Able to use RPE daily in rehab to express subjective intensity level;Long Term:  Able to use RPE to guide intensity level when exercising independently       Able to understand and use Dyspnea scale Yes       Intervention Provide education and explanation on how to use Dyspnea scale       Expected Outcomes Short Term: Able to use Dyspnea scale daily in rehab  to express subjective sense of shortness of breath  during exertion;Long Term: Able to use Dyspnea scale to guide intensity level when exercising independently       Knowledge and understanding of Target Heart Rate Range (THRR) Yes       Intervention Provide education and explanation of THRR including how the numbers were predicted and where they are located for reference       Expected Outcomes Short Term: Able to state/look up THRR;Long Term: Able to use THRR to govern intensity when exercising independently;Short Term: Able to use daily as guideline for intensity in rehab       Able to check pulse independently Yes       Intervention Provide education and demonstration on how to check pulse in carotid and radial arteries.;Review the importance of being able to check your own pulse for safety during independent exercise       Expected Outcomes Short Term: Able to explain why pulse checking is important during independent exercise;Long Term: Able to check pulse independently and accurately       Understanding of Exercise Prescription Yes       Intervention Provide education, explanation, and written materials on patient's individual exercise prescription       Expected Outcomes Short Term: Able to explain program exercise prescription;Long Term: Able to explain home exercise prescription to exercise independently                Exercise Goals Re-Evaluation :  Exercise Goals Re-Evaluation     [place] Name 03/13/22 1531 03/13/22 1533           Exercise Goal Re-Evaluation   Exercise Goals Review Increase Physical Activity;Increase Strength and Stamina;Understanding of Exercise Prescription;Able to understand and use Dyspnea scale;Able to check pulse independently;Able to understand and use rate of perceived exertion (RPE) scale;Knowledge and understanding of Target Heart Rate Range (THRR) Increase Physical Activity;Increase Strength and Stamina;Understanding of Exercise Prescription      Comments Reviewed RPE scale, THR and program prescription  with pt today.  Pt voiced understanding and was given a copy of goals to take home. Juliann Pulse is doing well for the first couple of weeks she has been here. She has been able to work at her initial exercise prescription and was even able to work at level 3 on the recumbent bike. She was able to walk 16 laps on the track. She has been hitting her THR each session. Will continue to monitor as she progresses.      Expected Outcomes Short: Use RPE daily to regulate intensity.  Long: Follow program prescription in THR. Short: Continue to follow exercise prescription and strive for 20 laps on track Long: Continue to build up overall strength and stamina               Discharge Exercise Prescription (Final Exercise Prescription Changes):  Exercise Prescription Changes - 03/13/22 1500       Response to Exercise   Blood Pressure (Admit) 122/66    Blood Pressure (Exercise) 136/72    Blood Pressure (Exit) 122/60    Heart Rate (Admit) 70 bpm    Heart Rate (Exercise) 120 bpm    Heart Rate (Exit) 81 bpm    Rating of Perceived Exertion (Exercise) 14    Symptoms none    Comments 2nd full week of exercise    Duration Progress to 30 minutes of  aerobic without signs/symptoms of physical distress    Intensity THRR unchanged  Progression   Progression Continue to progress workloads to maintain intensity without signs/symptoms of physical distress.    Average METs 2.18      Resistance Training   Training Prescription Yes    Weight 3 lb    Reps 10-15      Interval Training   Interval Training No      Treadmill   MPH 1.7    Grade 0    Minutes 15    METs 2.3      Recumbant Bike   Level 3    Watts 20    Minutes 15    METs 2.55      Recumbant Elliptical   Level 1    Minutes 15    METs 1.6      REL-XR   Level 1    Minutes 15    METs 1.5      T5 Nustep   Level 2    Minutes 15    METs 1.8      Track   Laps 16    Minutes 15    METs 1.87      Oxygen   Maintain Oxygen  Saturation 88% or higher             Nutrition:  Target Goals: Understanding of nutrition guidelines, daily intake of sodium '1500mg'$ , cholesterol '200mg'$ , calories 30% from fat and 7% or less from saturated fats, daily to have 5 or more servings of fruits and vegetables.  Education: All About Nutrition: -Group instruction provided by verbal, written material, interactive activities, discussions, models, and posters to present general guidelines for heart healthy nutrition including fat, fiber, MyPlate, the role of sodium in heart healthy nutrition, utilization of the nutrition label, and utilization of this knowledge for meal planning. Follow up email sent as well. Written material given at graduation. Flowsheet Row Cardiac Rehab from 03/07/2022 in [facility] Cardiac and Pulmonary Rehab  Education need identified 02/21/22       Biometrics:  Pre Biometrics - 02/21/22 1233       Pre Biometrics   Height 5' 5.5" (1.664 m)    Weight 257 lb 1.6 oz (116.6 kg)    Waist Circumference 42 inches    Hip Circumference 52.5 inches    Waist to Hip Ratio 0.8 %    BMI (Calculated) 42.12    Single Leg Stand 7.2 seconds              Nutrition Therapy Plan and Nutrition Goals:  Nutrition Therapy & Goals - 02/21/22 1104       Nutrition Therapy   Diet Heart healthy, low Na, CKD stg3-4 MNT, Pulmonary MNT    Drug/Food Interactions Statins/Certain Fruits    Protein (specify units) 85-90g    Fiber 28 grams    Whole Grain Foods 3 servings    Saturated Fats 16 max. grams    Fruits and Vegetables 8 servings/day    Sodium 1.5 grams      Personal Nutrition Goals   Nutrition Goal ST: practice adding in protein to eat meal. Include, but limit high potassium foods. Practice batch cooking and easy to prep meals. LT: meet protein/calorie needs, continue to limit sodium <1.5g/day, follow MyPlate guidelines    Comments 81 y.o. F admitted to cardiac rehab s/p TAVR. PMHx includes AFib, chronic combined  systolic and diastolic HF, COPD, hypothyroidism, CKD st 3, HLD, anemia, HTN. Relevant medications includes lipitor, biotin, calcium-magnesium-zinc, vit D3, CoQ10, chromium, furosemide, probiotics, magnesium, MVI, K+, zinc, synthoid. Recent  relevant labs: BMP: creatinine 1.5, GFR 33, potassium 4.5. TSH: 9.219. CBC: hemoglobin 11. Her food allergies includes persimmons. Juliann Pulse reports keeping a low salt diet and trying her best to choose heart healthy foods and has done so for a long time. She reports that recently her appetite has been lower which has also caused her to not be interested in cooking. She likes to make PB sandwiches on whole wheat bread, getting food from [employer] like soup and salads, cottage cheese, greek yogurt, as well as lots of fruits/vegetables. Juliann Pulse reports liking leafy greens, but reports she can't have them or needs to have a consistent amount with elliquis - discussed how that would be for coumadin/warfarin and not for elliquis; she reports she has been on warfarin previously. Reviewed general heart healthy eating and discussed CKD stg 3-4 MNT as well as pulmonary MNT. Discussed the importance of meeting calorie and protein needs to maintain lean muscle tissue - encouraged her to have at least one good source of protein at each meal: cottage cheese, nuts/seeds or nut/seed butter, greek yogurt, eggs, beans/lentils, fish, chicken, meat (less than 2-3x/week); if she bakes a potato she can top it with greek yogurt or smash white beans into it, she could add nuts to her cereal in the morning, or have an egg as part of a snack.  She reports that she will sometimes include ensure to help meet her nutrient needs - discussed how that can help, but to be cautious of the potassium in there and to adjust high potassium foods with what her blood work shows as she is already on potassium with her diuretic and it is encouagred to get in potassium rich foods like whole grains, beans and lentils in a heart  healhty diet. Discussed ingredient prepping and batch meal prepping like chili or soup frozen in individual servings for an easy meal. Provided handouts.      Intervention Plan   Intervention Prescribe, educate and counsel regarding individualized specific dietary modifications aiming towards targeted core components such as weight, hypertension, lipid management, diabetes, heart failure and other comorbidities.;Nutrition handout(s) given to patient.    Expected Outcomes Short Term Goal: Understand basic principles of dietary content, such as calories, fat, sodium, cholesterol and nutrients.;Short Term Goal: A plan has been developed with personal nutrition goals set during dietitian appointment.;Long Term Goal: Adherence to prescribed nutrition plan.             Nutrition Assessments:  MEDIFICTS Score Key: ?70 Need to make dietary changes  40-70 Heart Healthy Diet ? 40 Therapeutic Level Cholesterol Diet  Flowsheet Row Cardiac Rehab from 02/21/2022 in [facility] Cardiac and Pulmonary Rehab  Picture Your Plate Total Score on Admission 87      Picture Your Plate Scores: <35 Unhealthy dietary pattern with much room for improvement. 41-50 Dietary pattern unlikely to meet recommendations for good health and room for improvement. 51-60 More healthful dietary pattern, with some room for improvement.  >60 Healthy dietary pattern, although there may be some specific behaviors that could be improved.    Nutrition Goals Re-Evaluation:   Nutrition Goals Discharge (Final Nutrition Goals Re-Evaluation):   Psychosocial: Target Goals: Acknowledge presence or absence of significant depression and/or stress, maximize coping skills, provide positive support system. Participant is able to verbalize types and ability to use techniques and skills needed for reducing stress and depression.   Education: Stress, Anxiety, and Depression - Group verbal and visual presentation to define topics covered.   Reviews how body is  impacted by stress, anxiety, and depression.  Also discusses healthy ways to reduce stress and to treat/manage anxiety and depression.  Written material given at graduation. Flowsheet Row Cardiac Rehab from 03/07/2022 in [facility] Cardiac and Pulmonary Rehab  Date 03/07/22  Educator [facility]  Instruction Review Code 1- [employer] Understanding       Education: Sleep Hygiene -Provides group verbal and written instruction about how sleep can affect your health.  Define sleep hygiene, discuss sleep cycles and impact of sleep habits. Review good sleep hygiene tips.    Initial Review & Psychosocial Screening:  Initial Psych Review & Screening - 02/13/22 1012       Initial Review   Current issues with Current Stress Concerns;Current Sleep Concerns    Source of Stress Concerns Chronic Illness;Unable to participate in former interests or hobbies      [place]? Yes   friends and church group     Barriers   Psychosocial barriers to participate in program There are no identifiable barriers or psychosocial needs.;The patient should benefit from training in stress management and relaxation.      Screening Interventions   Interventions Encouraged to exercise;Provide feedback about the scores to participant;To provide support and resources with identified psychosocial needs    Expected Outcomes Short Term goal: Utilizing psychosocial counselor, staff and physician to assist with identification of specific Stressors or current issues interfering with healing process. Setting desired goal for each stressor or current issue identified.;Long Term Goal: Stressors or current issues are controlled or eliminated.;Short Term goal: Identification and review with participant of any Quality of Life or Depression concerns found by scoring the questionnaire.;Long Term goal: The participant improves quality of Life and PHQ9 Scores as seen by post scores and/or verbalization of  changes             Quality of Life Scores:   Quality of Life - 02/21/22 1234       Quality of Life   Select Quality of Life      Quality of Life Scores   Health/Function Pre 22.35 %    Socioeconomic Pre 24.43 %    Psych/Spiritual Pre 27.21 %    Family Pre --   no answers   GLOBAL Pre 24.15 %            Scores of 19 and below usually indicate a poorer quality of life in these areas.  A difference of  2-3 points is a clinically meaningful difference.  A difference of 2-3 points in the total score of the Quality of Life Index has been associated with significant improvement in overall quality of life, self-image, physical symptoms, and general health in studies assessing change in quality of life.  PHQ-9: Review Flowsheet  More data exists      02/21/2022 07/14/2021 06/02/2021 09/19/2020 10/16/2019  Depression screen PHQ 2/9  Decreased Interest 1 0 '1 1 1  '$ Down, Depressed, Hopeless 0 0 0 1 1  PHQ - 2 Score 1 0 '1 2 2  '$ Altered sleeping 2 - '3 1 2  '$ Tired, decreased energy 2 - '2 1 3  '$ Change in appetite 1 - '1 1 2  '$ Feeling bad or failure about yourself  0 - 0 0 0  Trouble concentrating 1 - 0 1 1  Moving slowly or fidgety/restless 0 - 0 0 0  Suicidal thoughts 0 - 0 0 0  PHQ-9 Score 7 - '7 6 10  '$ Difficult doing work/chores Somewhat difficult -  Somewhat difficult Not difficult at all Not difficult at all   Interpretation of Total Score  Total Score Depression Severity:  1-4 = Minimal depression, 5-9 = Mild depression, 10-14 = Moderate depression, 15-19 = Moderately severe depression, 20-27 = Severe depression   Psychosocial Evaluation and Intervention:  Psychosocial Evaluation - 02/13/22 1032       Psychosocial Evaluation & Interventions   Interventions Encouraged to exercise with the program and follow exercise prescription;Stress management education;Relaxation education    Comments 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 She quit her job last fall because of her declining health and trying to figure out what was going on. 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 She has been struggling with lymphedema with the complication of cellulitis this year as well. She prides herself on being independent and works hard to maintain that. She has a great support system of friends and church family. She mentioned that her sleep is a struggle with taking lasix and a lot on her mind. She feels very sluggish and doesn't know if it is related to all that she has been through these last few months or a form of depression. She is very aware of her mental health and has a strong resolve to push through it. She is wanting to gain back her strength by coming here. She is excited to get started.    Expected Outcomes Short: attend cardiac rehab for education and exercise. Long: develop and maintain positive self care habits.    Continue Psychosocial Services  Follow up required by staff             Psychosocial Re-Evaluation:   Psychosocial Discharge (Final Psychosocial Re-Evaluation):   Vocational Rehabilitation: Provide vocational rehab assistance to qualifying candidates.   Vocational Rehab Evaluation & Intervention:  Vocational Rehab - 02/13/22 1010       Initial Vocational Rehab Evaluation & Intervention   Assessment shows need for Vocational Rehabilitation No             Education: Education Goals: Education classes will be provided on a variety of topics geared toward better understanding of heart health and risk factor modification. Participant will state understanding/return demonstration of topics presented as noted by education test scores.  Learning Barriers/Preferences:  Learning Barriers/Preferences - 02/13/22 1007       Learning  Barriers/Preferences   Learning Barriers Sight    Learning Preferences Individual Instruction             General Cardiac Education Topics:  AED/CPR: - Group verbal and written instruction with the use of models to demonstrate the basic use of the AED with the basic ABC's of resuscitation.   Anatomy and Cardiac Procedures: - Group verbal and visual presentation and models provide information about basic cardiac anatomy and function. Reviews the testing methods done to diagnose heart disease and the outcomes of the test results. Describes the treatment choices: Medical Management, Angioplasty, or Coronary Bypass Surgery for treating various heart conditions including Myocardial Infarction, Angina, Valve Disease, and Cardiac Arrhythmias.  Written material given at graduation. Flowsheet Row Cardiac Rehab from 03/07/2022 in [facility] Cardiac and Pulmonary Rehab  Education need identified 02/21/22       Medication Safety: - Group verbal and visual instruction to review commonly prescribed  medications for heart and lung disease. Reviews the medication, class of the drug, and side effects. Includes the steps to properly store meds and maintain the prescription regimen.  Written material given at graduation.   Intimacy: - Group verbal instruction through game format to discuss how heart and lung disease can affect sexual intimacy. Written material given at graduation..   Know Your Numbers and Heart Failure: - Group verbal and visual instruction to discuss disease risk factors for cardiac and pulmonary disease and treatment options.  Reviews associated critical values for Overweight/Obesity, Hypertension, Cholesterol, and Diabetes.  Discusses basics of heart failure: signs/symptoms and treatments.  Introduces Heart Failure Zone chart for action plan for heart failure.  Written material given at graduation.   Infection Prevention: - Provides verbal and written material to individual with  discussion of infection control including proper hand washing and proper equipment cleaning during exercise session. Flowsheet Row Cardiac Rehab from 03/07/2022 in [facility] Cardiac and Pulmonary Rehab  Date 02/21/22  Educator [facility]  Instruction Review Code 1- Verbalizes Understanding       Falls Prevention: - Provides verbal and written material to individual with discussion of falls prevention and safety. Flowsheet Row Cardiac Rehab from 03/07/2022 in [facility] Cardiac and Pulmonary Rehab  Date 02/21/22  Educator [facility]  Instruction Review Code 1- Verbalizes Understanding       Other: -Provides group and verbal instruction on various topics (see comments)   Knowledge Questionnaire Score:  Knowledge Questionnaire Score - 02/21/22 1235       Knowledge Questionnaire Score   Pre Score 21/26             Core Components/Risk Factors/Patient Goals at Admission:  Personal Goals and Risk Factors at Admission - 02/21/22 1235       Core Components/Risk Factors/Patient Goals on Admission    Weight Management Yes;Weight Loss;Obesity    Intervention Weight Management: Develop a combined nutrition and exercise program designed to reach desired caloric intake, while maintaining appropriate intake of nutrient and fiber, sodium and fats, and appropriate energy expenditure required for the weight goal.;Weight Management: Provide education and appropriate resources to help participant work on and attain dietary goals.;Weight Management/Obesity: Establish reasonable short term and long term weight goals.;Obesity: Provide education and appropriate resources to help participant work on and attain dietary goals.    Admit Weight 257 lb 1.6 oz (116.6 kg)    Goal Weight: Short Term 250 lb (113.4 kg)    Goal Weight: Long Term 240 lb (108.9 kg)    Expected Outcomes Short Term: Continue to assess and modify interventions until short term weight is achieved;Long Term: Adherence to nutrition and physical  activity/exercise program aimed toward attainment of established weight goal;Weight Loss: Understanding of general recommendations for a balanced deficit meal plan, which promotes 1-2 lb weight loss per week and includes a negative energy balance of [phone] kcal/d;Understanding recommendations for meals to include 15-35% energy as protein, 25-35% energy from fat, 35-60% energy from carbohydrates, less than '200mg'$  of dietary cholesterol, 20-35 gm of total fiber daily;Understanding of distribution of calorie intake throughout the day with the consumption of 4-5 meals/snacks    Heart Failure Yes    Intervention Provide a combined exercise and nutrition program that is supplemented with education, support and counseling about heart failure. Directed toward relieving symptoms such as shortness of breath, decreased exercise tolerance, and extremity edema.    Expected Outcomes Improve functional capacity of life;Short term: Attendance in program 2-3 days a week with increased exercise capacity.  Reported lower sodium intake. Reported increased fruit and vegetable intake. Reports medication compliance.;Short term: Daily weights obtained and reported for increase. Utilizing diuretic protocols set by physician.;Long term: Adoption of self-care skills and reduction of barriers for early signs and symptoms recognition and intervention leading to self-care maintenance.    Hypertension Yes    Intervention Provide education on lifestyle modifcations including regular physical activity/exercise, weight management, moderate sodium restriction and increased consumption of fresh fruit, vegetables, and low fat dairy, alcohol moderation, and smoking cessation.;Monitor prescription use compliance.    Expected Outcomes Short Term: Continued assessment and intervention until BP is < 140/87m HG in hypertensive participants. < 130/834mHG in hypertensive participants with diabetes, heart failure or chronic kidney disease.;Long Term:  Maintenance of blood pressure at goal levels.    Lipids Yes    Intervention Provide education and support for participant on nutrition & aerobic/resistive exercise along with prescribed medications to achieve LDL '70mg'$ , HDL >'40mg'$ .    Expected Outcomes Short Term: Participant states understanding of desired cholesterol values and is compliant with medications prescribed. Participant is following exercise prescription and nutrition guidelines.;Long Term: Cholesterol controlled with medications as prescribed, with individualized exercise RX and with personalized nutrition plan. Value goals: LDL < '70mg'$ , HDL > 40 mg.             Education:Diabetes - Individual verbal and written instruction to review signs/symptoms of diabetes, desired ranges of glucose level fasting, after meals and with exercise. Acknowledge that pre and post exercise glucose checks will be done for 3 sessions at entry of program.   Core Components/Risk Factors/Patient Goals Review:    Core Components/Risk Factors/Patient Goals at Discharge (Final Review):    ITP Comments:  ITP Comments     Row Name 02/13/22 1003 02/21/22 1229 03/14/22 0917       ITP Comments Initial phone call completed. [facility]. EP Orientation scheduled for Wednesday 10/11 at 9am. Completed 6MWT and gym orientation. Initial ITP created and sent for review to Dr. MaEmily FilbertMedical Director. 30 Day review completed. Medical Director ITP review done, changes made as directed, and signed approval by Medical Director.   NEW TO PROGRAM              Comments:

## 2022-03-16 ENCOUNTER — Encounter: Payer: Medicare Other | Admitting: *Deleted

## 2022-03-16 DIAGNOSIS — Z952 Presence of prosthetic heart valve: Secondary | ICD-10-CM | POA: Diagnosis not present

## 2022-03-16 NOTE — Progress Notes (Signed)
Daily Session Note  Patient Details  Name: Mary Kennedy MRN: [id number] Date of Birth: 1940/07/25 Referring Provider:   Flowsheet Row Cardiac Rehab from 02/21/2022 in [facility] Cardiac and Pulmonary Rehab  Referring Provider Katrine Coho MD       Encounter Date: 03/16/2022  Check In:  Session Check In - 03/16/22 1137       Check-In   Supervising physician immediately available to respond to emergencies See telemetry face sheet for immediately available ER MD    Location [facility]-Cardiac & Pulmonary Rehab    Staff Present Heath Lark, RN, BSN, CCRP;Joseph [place], RCP,RRT,BSRT;Jessica [place], [place], [place], CCRP, CCET    Virtual Visit No    Medication changes reported     No    Fall or balance concerns reported    No    Warm-up and Cool-down Performed on first and last piece of equipment    Resistance Training Performed Yes    VAD Patient? No    PAD/SET Patient? No      Pain Assessment   Currently in Pain? No/denies                Social History   Tobacco Use  Smoking Status Former   Types: Cigarettes   Quit date: 05/13/1996   Years since quitting: 25.8  Smokeless Tobacco Never    Goals Met:  Independence with exercise equipment Exercise tolerated well No report of concerns or symptoms today  Goals Unmet:  Not Applicable  Comments: Pt able to follow exercise prescription today without complaint.  Will continue to monitor for progression.    Dr. Emily Filbert is Medical Director for [place].  Dr. Ottie Glazier is Medical Director for [facility] Pulmonary Rehabilitation.

## 2022-03-20 ENCOUNTER — Encounter: Payer: Medicare Other | Admitting: *Deleted

## 2022-03-20 DIAGNOSIS — Z952 Presence of prosthetic heart valve: Secondary | ICD-10-CM

## 2022-03-20 NOTE — Progress Notes (Signed)
Daily Session Note  Patient Details  Name: Mary Kennedy MRN: [id number] Date of Birth: 05-Apr-1941 Referring Provider:   Flowsheet Row Cardiac Rehab from 02/21/2022 in [facility] Cardiac and Pulmonary Rehab  Referring Provider Katrine Coho MD       Encounter Date: 03/20/2022  Check In:  Session Check In - 03/20/22 1154       Check-In   Supervising physician immediately available to respond to emergencies See telemetry face sheet for immediately available ER MD    Location [facility]-Cardiac & Pulmonary Rehab    Staff Present Heath Lark, RN, BSN, Jacklynn Bue, MS, ASCM CEP, Exercise Physiologist;Noah Tickle, BS, Exercise Physiologist    Virtual Visit No    Medication changes reported     No    Fall or balance concerns reported    No    Warm-up and Cool-down Performed on first and last piece of equipment    Resistance Training Performed Yes    VAD Patient? No    PAD/SET Patient? No      Pain Assessment   Currently in Pain? No/denies               Exercise Prescription Changes - 03/20/22 1100       Home Exercise Plan   Plans to continue exercise at Home (comment)   walking, staff videos   Frequency Add 2 additional days to program exercise sessions.    Initial Home Exercises Provided 03/20/22             Social History   Tobacco Use  Smoking Status Former   Types: Cigarettes   Quit date: 05/13/1996   Years since quitting: 25.8  Smokeless Tobacco Never    Goals Met:  Independence with exercise equipment Exercise tolerated well No report of concerns or symptoms today  Goals Unmet:  Not Applicable  Comments: Pt able to follow exercise prescription today without complaint.  Will continue to monitor for progression.    Dr. Emily Filbert is Medical Director for [place].  Dr. Ottie Glazier is Medical Director for [facility] Pulmonary Rehabilitation.

## 2022-03-21 ENCOUNTER — Encounter: Payer: Medicare Other | Admitting: *Deleted

## 2022-03-21 DIAGNOSIS — Z952 Presence of prosthetic heart valve: Secondary | ICD-10-CM | POA: Diagnosis not present

## 2022-03-21 NOTE — Progress Notes (Signed)
Daily Session Note  Patient Details  Name: Mary Kennedy MRN: [id number] Date of Birth: Dec 06, 1940 Referring Provider:   Flowsheet Row Cardiac Rehab from 02/21/2022 in [facility] Cardiac and Pulmonary Rehab  Referring Provider Katrine Coho MD       Encounter Date: 03/21/2022  Check In:  Session Check In - 03/21/22 1103       Check-In   Supervising physician immediately available to respond to emergencies See telemetry face sheet for immediately available ER MD    Location [facility]-Cardiac & Pulmonary Rehab    Staff Present Antionette Fairy, BS, Exercise Physiologist;Joseph Lyons, RCP,RRT,BSRT;Marisol Giambra Anderson, RN Odelia Gage, RN, ADN    Virtual Visit No    Medication changes reported     No    Fall or balance concerns reported    No    Warm-up and Cool-down Performed on first and last piece of equipment    Resistance Training Performed Yes    VAD Patient? No    PAD/SET Patient? No      Pain Assessment   Currently in Pain? No/denies                Social History   Tobacco Use  Smoking Status Former   Types: Cigarettes   Quit date: 05/13/1996   Years since quitting: 25.8  Smokeless Tobacco Never    Goals Met:  Independence with exercise equipment Exercise tolerated well No report of concerns or symptoms today Strength training completed today  Goals Unmet:  Not Applicable  Comments: Pt able to follow exercise prescription today without complaint.  Will continue to monitor for progression.    Dr. Emily Filbert is Medical Director for [place].  Dr. Ottie Glazier is Medical Director for [facility] Pulmonary Rehabilitation.

## 2022-03-23 ENCOUNTER — Encounter: Payer: Medicare Other | Admitting: *Deleted

## 2022-03-23 DIAGNOSIS — Z952 Presence of prosthetic heart valve: Secondary | ICD-10-CM

## 2022-03-23 NOTE — Progress Notes (Signed)
Daily Session Note  Patient Details  Name: Mary Kennedy MRN: [id number] Date of Birth: 13-Sep-1940 Referring Provider:   Flowsheet Row Cardiac Rehab from 02/21/2022 in [facility] Cardiac and Pulmonary Rehab  Referring Provider Katrine Coho MD       Encounter Date: 03/23/2022  Check In:  Session Check In - 03/23/22 1147       Check-In   Supervising physician immediately available to respond to emergencies See telemetry face sheet for immediately available ER MD    Location [facility]-Cardiac & Pulmonary Rehab    Staff Present Heath Lark, RN, BSN, CCRP;Jessica [place], MA, RCEP, CCRP, CCET;Joseph [place], [place]    Virtual Visit No    Medication changes reported     No    Fall or balance concerns reported    No    Warm-up and Cool-down Performed on first and last piece of equipment    Resistance Training Performed Yes    VAD Patient? No    PAD/SET Patient? No      Pain Assessment   Currently in Pain? No/denies                Social History   Tobacco Use  Smoking Status Former   Types: Cigarettes   Quit date: 05/13/1996   Years since quitting: 25.8  Smokeless Tobacco Never    Goals Met:  Independence with exercise equipment Exercise tolerated well No report of concerns or symptoms today  Goals Unmet:  Not Applicable  Comments: Pt able to follow exercise prescription today without complaint.  Will continue to monitor for progression.    Dr. Emily Filbert is Medical Director for [place].  Dr. Ottie Glazier is Medical Director for [facility] Pulmonary Rehabilitation.

## 2022-03-26 ENCOUNTER — Encounter: Payer: Medicare Other | Admitting: *Deleted

## 2022-03-26 DIAGNOSIS — Z952 Presence of prosthetic heart valve: Secondary | ICD-10-CM

## 2022-03-26 NOTE — Progress Notes (Signed)
Daily Session Note  Patient Details  Name: Mary Kennedy MRN: [id number] Date of Birth: 08-25-1940 Referring Provider:   Flowsheet Row Cardiac Rehab from 02/21/2022 in [facility] Cardiac and Pulmonary Rehab  Referring Provider Katrine Coho MD       Encounter Date: 03/26/2022  Check In:  Session Check In - 03/26/22 1116       Check-In   Supervising physician immediately available to respond to emergencies See telemetry face sheet for immediately available ER MD    Location [facility]-Cardiac & Pulmonary Rehab    Staff Present Antionette Fairy, BS, Exercise Physiologist;Kelly Amedeo Plenty, BS, ACSM CEP, Exercise Physiologist;Chasen Mendell Tamala [place], RN, ADN    Virtual Visit No    Medication changes reported     No    Fall or balance concerns reported    No    Warm-up and Cool-down Performed on first and last piece of equipment    Resistance Training Performed Yes    VAD Patient? No    PAD/SET Patient? No      Pain Assessment   Currently in Pain? No/denies                Social History   Tobacco Use  Smoking Status Former   Types: Cigarettes   Quit date: 05/13/1996   Years since quitting: 25.8  Smokeless Tobacco Never    Goals Met:  Independence with exercise equipment Exercise tolerated well No report of concerns or symptoms today Strength training completed today  Goals Unmet:  Not Applicable  Comments: Pt able to follow exercise prescription today without complaint.  Will continue to monitor for progression.    Dr. Emily Filbert is Medical Director for [place].  Dr. Ottie Glazier is Medical Director for [facility] Pulmonary Rehabilitation.

## 2022-03-28 ENCOUNTER — Encounter: Payer: Medicare Other | Admitting: *Deleted

## 2022-03-28 DIAGNOSIS — Z952 Presence of prosthetic heart valve: Secondary | ICD-10-CM | POA: Diagnosis not present

## 2022-03-28 NOTE — Progress Notes (Signed)
Daily Session Note  Patient Details  Name: Mary Kennedy MRN: [id number] Date of Birth: May 16, 1940 Referring Provider:   Flowsheet Row Cardiac Rehab from 02/21/2022 in [facility] Cardiac and Pulmonary Rehab  Referring Provider Katrine Coho MD       Encounter Date: 03/28/2022  Check In:  Session Check In - 03/28/22 1132       Check-In   Supervising physician immediately available to respond to emergencies See telemetry face sheet for immediately available ER MD    Location [facility]-Cardiac & Pulmonary Rehab    Staff Present Antionette Fairy, BS, Exercise Physiologist;Meredith Sherryll Burger, RN BSN;Joseph Rosebud Poles, RN, [place]    Virtual Visit No    Medication changes reported     No    Fall or balance concerns reported    No    Warm-up and Cool-down Performed on first and last piece of equipment    Resistance Training Performed Yes    VAD Patient? No    PAD/SET Patient? No      Pain Assessment   Currently in Pain? No/denies                Social History   Tobacco Use  Smoking Status Former   Types: Cigarettes   Quit date: 05/13/1996   Years since quitting: 25.8  Smokeless Tobacco Never    Goals Met:  Proper associated with RPD/PD & O2 Sat Exercise tolerated well No report of concerns or symptoms today Strength training completed today  Goals Unmet:  Not Applicable  Comments: Pt able to follow exercise prescription today without complaint.  Will continue to monitor for progression.    Dr. Emily Filbert is Medical Director for [place].  Dr. Ottie Glazier is Medical Director for [facility] Pulmonary Rehabilitation.

## 2022-03-30 ENCOUNTER — Encounter: Payer: Medicare Other | Admitting: *Deleted

## 2022-03-30 DIAGNOSIS — Z952 Presence of prosthetic heart valve: Secondary | ICD-10-CM | POA: Diagnosis not present

## 2022-03-30 NOTE — Progress Notes (Signed)
Daily Session Note  Patient Details  Name: CHARITY TESSIER MRN: [id number] Date of Birth: 1940/10/04 Referring Provider:   Flowsheet Row Cardiac Rehab from 02/21/2022 in [facility] Cardiac and Pulmonary Rehab  Referring Provider Katrine Coho MD       Encounter Date: 03/30/2022  Check In:  Session Check In - 03/30/22 1059       Check-In   Supervising physician immediately available to respond to emergencies See telemetry face sheet for immediately available ER MD    Location [facility]-Cardiac & Pulmonary Rehab    Staff Present Alberteen Sam, MA, RCEP, CCRP, CCET;Joseph [place], Ernestina Patches, RN, [place]    Virtual Visit No    Medication changes reported     No    Fall or balance concerns reported    No    Warm-up and Cool-down Performed on first and last piece of equipment    Resistance Training Performed Yes    VAD Patient? No    PAD/SET Patient? No      Pain Assessment   Currently in Pain? No/denies                Social History   Tobacco Use  Smoking Status Former   Types: Cigarettes   Quit date: 05/13/1996   Years since quitting: 25.8  Smokeless Tobacco Never    Goals Met:  Independence with exercise equipment Exercise tolerated well No report of concerns or symptoms today Strength training completed today  Goals Unmet:  Not Applicable  Comments: Pt able to follow exercise prescription today without complaint.  Will continue to monitor for progression.    Dr. Emily Filbert is Medical Director for [place].  Dr. Ottie Glazier is Medical Director for [facility] Pulmonary Rehabilitation.

## 2022-04-10 ENCOUNTER — Encounter: Payer: Medicare Other | Admitting: *Deleted

## 2022-04-10 DIAGNOSIS — Z952 Presence of prosthetic heart valve: Secondary | ICD-10-CM

## 2022-04-10 NOTE — Progress Notes (Signed)
Daily Session Note  Patient Details  Name: Mary Kennedy MRN: [id number] Date of Birth: 10-Apr-1941 Referring Provider:   Flowsheet Row Cardiac Rehab from 02/21/2022 in [facility] Cardiac and Pulmonary Rehab  Referring Provider Katrine Coho MD       Encounter Date: 04/10/2022  Check In:  Session Check In - 04/10/22 1114       Check-In   Supervising physician immediately available to respond to emergencies See telemetry face sheet for immediately available ER MD    Location [facility]-Cardiac & Pulmonary Rehab    Staff Present Coralie Keens, MS, ASCM CEP, Exercise Physiologist;Jessica Luan Pulling, MA, RCEP, CCRP, CCET;Shameika Speelman Sherryll Burger, RN BSN    Virtual Visit No    Medication changes reported     No    Fall or balance concerns reported    No    Warm-up and Cool-down Performed on first and last piece of equipment    Resistance Training Performed Yes    VAD Patient? No    PAD/SET Patient? No      Pain Assessment   Currently in Pain? No/denies                Social History   Tobacco Use  Smoking Status Former   Types: Cigarettes   Quit date: 05/13/1996   Years since quitting: 25.9  Smokeless Tobacco Never    Goals Met:  Independence with exercise equipment Exercise tolerated well No report of concerns or symptoms today Strength training completed today  Goals Unmet:  Not Applicable  Comments: Pt able to follow exercise prescription today without complaint.  Will continue to monitor for progression.    Dr. Emily Filbert is Medical Director for [place].  Dr. Ottie Glazier is Medical Director for [facility] Pulmonary Rehabilitation.

## 2022-04-11 ENCOUNTER — Encounter: Payer: Self-pay | Admitting: *Deleted

## 2022-04-11 ENCOUNTER — Encounter: Payer: Medicare Other | Admitting: *Deleted

## 2022-04-11 DIAGNOSIS — Z952 Presence of prosthetic heart valve: Secondary | ICD-10-CM

## 2022-04-11 NOTE — Progress Notes (Signed)
Daily Session Note  Patient Details  Name: Mary Kennedy MRN: [id number] Date of Birth: 1940/06/16 Referring Provider:   Flowsheet Row Cardiac Rehab from 02/21/2022 in [facility]  Referring Provider Katrine Coho MD       Encounter Date: 04/11/2022  Check In:  Session Check In - 04/11/22 1118       Check-In   Supervising physician immediately available to respond to emergencies See telemetry face sheet for immediately available ER MD    Location [facility]-Cardiac & Pulmonary Rehab    Staff Present Renita Papa, RN BSN;Jessica Luan Pulling, MA, RCEP, CCRP, Mindi Curling, RN, Dimple Nanas, BS, Exercise Physiologist    Virtual Visit No    Medication changes reported     No    Fall or balance concerns reported    No    Warm-up and Cool-down Performed on first and last piece of equipment    Resistance Training Performed Yes    VAD Patient? No    PAD/SET Patient? No      Pain Assessment   Currently in Pain? No/denies                Social History   Tobacco Use  Smoking Status Former   Types: Cigarettes   Quit date: 05/13/1996   Years since quitting: 25.9  Smokeless Tobacco Never    Goals Met:  Independence with exercise equipment Exercise tolerated well No report of concerns or symptoms today Strength training completed today  Goals Unmet:  Not Applicable  Comments: Pt able to follow exercise prescription today without complaint.  Will continue to monitor for progression.    Dr. Emily Filbert is Medical Director for [place].  Dr. Ottie Glazier is Medical Director for [facility].

## 2022-04-11 NOTE — Progress Notes (Signed)
Cardiac Individual Treatment Plan  Patient Details  Name: Mary Kennedy MRN: [id number] Date of Birth: [birth year] Referring Provider:   Flowsheet Row Cardiac Rehab from 02/21/2022 in [facility] Cardiac and Pulmonary Rehab  Referring Provider Katrine Coho MD       Initial Encounter Date:  Flowsheet Row Cardiac Rehab from 02/21/2022 in [facility] Cardiac and Pulmonary Rehab  Date 02/21/22       Visit Diagnosis: S/P TAVR (transcatheter aortic valve replacement)  Patient's Home Medications on Admission:  Current Outpatient Medications:    amiodarone (PACERONE) 200 MG tablet, Take 100 mg by mouth daily., Disp: , Rfl:    amLODipine (NORVASC) 10 MG tablet, Take by mouth., Disp: , Rfl:    apixaban (ELIQUIS) 2.5 MG TABS tablet, Take 2.5 mg by mouth 2 (two) times daily., Disp: , Rfl:    atorvastatin (LIPITOR) 20 MG tablet, Take 20 mg by mouth daily., Disp: , Rfl:    Biotin 1 MG CAPS, Take 1 capsule by mouth daily., Disp: , Rfl:    CALCIUM-MAGNESUIUM-ZINC 333-133-8.3 MG TABS, Take 2 tablets by mouth at bedtime., Disp: , Rfl:    Cholecalciferol (D3-1000) 25 MCG (1000 UT) capsule, Take 1,000 Units by mouth daily., Disp: , Rfl:    Chromium Picolinate 1000 MCG TABS, Take 1 tablet by mouth daily., Disp: , Rfl:    Coenzyme Q10 (CO Q 10 PO), Take 1 tablet by mouth daily., Disp: , Rfl:    furosemide (LASIX) 40 MG tablet, Take 1 tablet by mouth 2 (two) times daily., Disp: , Rfl:    LACTOBACILLUS PO, Take 1 capsule by mouth daily., Disp: , Rfl:    levothyroxine (SYNTHROID) 125 MCG tablet, Take 125 mcg by mouth daily before breakfast., Disp: , Rfl:    MAGNESIUM MALATE PO, Take 1,000 mg by mouth at bedtime., Disp: , Rfl:    metoprolol succinate (TOPROL-XL) 50 MG 24 hr tablet, Take 50 mg by mouth daily., Disp: , Rfl:    Multiple Vitamins-Minerals (MULTIVITAMIN MEN 50+ PO), Take 1 tablet by mouth daily., Disp: , Rfl:    potassium chloride (KLOR-CON) 20 MEQ packet, Take 20 mEq by mouth daily., Disp: , Rfl:     tiotropium (SPIRIVA HANDIHALER) 18 MCG inhalation capsule, Place into inhaler and inhale., Disp: , Rfl:    triamcinolone ointment (KENALOG) 0.1 %, Apply 1 Application topically 2 (two) times daily., Disp: , Rfl:    zinc gluconate 50 MG tablet, Take 50 mg by mouth daily., Disp: , Rfl:   Past Medical History: Past Medical History:  Diagnosis Date   Allergy    Aortic stenosis    Atrial fibrillation ([place])    Atypical squamous cells of undetermined significance on cytologic smear of vagina (ASC-US) 12/01/2014   s/p gynecology consultation 07/2010; s/p repeat pap smear with HPV.    Cellulitis    Heart murmur    Hyperlipidemia    Hypertension    Hypothyroidism    Thyroid disease    Wears dentures    partial lower, full upper    Tobacco Use: Social History   Tobacco Use  Smoking Status Former   Types: Cigarettes   Quit date: 05/13/1996   Years since quitting: 25.9  Smokeless Tobacco Never    Labs: Review Flowsheet  More data exists      Latest Ref Rng & Units 06/23/2018 10/19/2019 09/21/2020 06/05/2021 12/09/2021  Labs for ITP Cardiac and Pulmonary Rehab  Cholestrol 100 - 199 mg/dL 173  163  157  155  -  LDL (  calc) 0 - 99 mg/dL 110  102  95  96  -  HDL-C >39 mg/dL 44  41  46  45  -  Trlycerides 0 - 149 mg/dL 97  108  85  69  -  Hemoglobin A1c 4.8 - 5.6 % 6.0  5.5  - 5.4  -  Bicarbonate 20.0 - 28.0 mmol/L - - - - 30.9   O2 Saturation % - - - - 90.1      Exercise Target Goals: Exercise Program Goal: Individual exercise prescription set using results from initial 6 min walk test and THRR while considering  patient's activity barriers and safety.   Exercise Prescription Goal: Initial exercise prescription builds to 30-45 minutes a day of aerobic activity, 2-3 days per week.  Home exercise guidelines will be given to patient during program as part of exercise prescription that the participant will acknowledge.   Education: Aerobic Exercise: - Group verbal and visual  presentation on the components of exercise prescription. Introduces F.I.T.T principle from ACSM for exercise prescriptions.  Reviews F.I.T.T. principles of aerobic exercise including progression. Written material given at graduation. Flowsheet Row Cardiac Rehab from 03/28/2022 in [facility] Cardiac and Pulmonary Rehab  Education need identified 02/21/22  Date 03/21/22  Educator [facility]  Instruction Review Code 1- Verbalizes Understanding       Education: Resistance Exercise: - Group verbal and visual presentation on the components of exercise prescription. Introduces F.I.T.T principle from ACSM for exercise prescriptions  Reviews F.I.T.T. principles of resistance exercise including progression. Written material given at graduation.    Education: Exercise & Equipment Safety: - Individual verbal instruction and demonstration of equipment use and safety with use of the equipment. Flowsheet Row Cardiac Rehab from 03/28/2022 in [facility] Cardiac and Pulmonary Rehab  Date 02/21/22  Educator [facility]  Instruction Review Code 1- Verbalizes Understanding       Education: Exercise Physiology & General Exercise Guidelines: - Group verbal and written instruction with models to review the exercise physiology of the cardiovascular system and associated critical values. Provides general exercise guidelines with specific guidelines to those with heart or lung disease.  Flowsheet Row Cardiac Rehab from 03/28/2022 in [facility] Cardiac and Pulmonary Rehab  Date 03/14/22  Educator [facility]  Instruction Review Code 1- Verbalizes Understanding       Education: Flexibility, Balance, Mind/Body Relaxation: - Group verbal and visual presentation with interactive activity on the components of exercise prescription. Introduces F.I.T.T principle from ACSM for exercise prescriptions. Reviews F.I.T.T. principles of flexibility and balance exercise training including progression. Also discusses the mind body connection.  Reviews various  relaxation techniques to help reduce and manage stress (i.e. Deep breathing, progressive muscle relaxation, and visualization). Balance handout provided to take home. Written material given at graduation.   Activity Barriers & Risk Stratification:  Activity Barriers & Cardiac Risk Stratification - 02/21/22 1230       Activity Barriers & Cardiac Risk Stratification   Activity Barriers Joint Problems;Arthritis;Deconditioning;Balance Concerns;Shortness of Breath;Decreased Ventricular Function    Cardiac Risk Stratification High             6 Minute Walk:  6 Minute Walk     Row Name 02/21/22 1229         6 Minute Walk   Phase Initial     Distance 790 feet     Walk Time 5.3 minutes     # of Rest Breaks 2  22 sec, 20 sec     MPH 1.69     METS  1.16     RPE 15     Perceived Dyspnea  4     VO2 Peak 4.05     Symptoms Yes (comment)     Comments SOB, bilateral knee pain 4/10     Resting HR 71 bpm     Resting BP 126/74     Resting Oxygen Saturation  97 %     Exercise Oxygen Saturation  during 6 min walk 97 %     Max Ex. HR 109 bpm     Max Ex. BP 186/84     2 Minute Post BP 164/80              Oxygen Initial Assessment:   Oxygen Re-Evaluation:   Oxygen Discharge (Final Oxygen Re-Evaluation):   Initial Exercise Prescription:  Initial Exercise Prescription - 02/21/22 1200       Date of Initial Exercise RX and Referring Provider   Date 02/21/22    Referring Provider Katrine Coho MD      Oxygen   Maintain Oxygen Saturation 88% or higher      Treadmill   MPH 1.3    Grade 0    Minutes 15    METs 2      Recumbant Elliptical   Level 1    RPM 50    Minutes 15    METs 2      REL-XR   Level 1    Speed 50    Minutes 15    METs 2      Track   Laps 20    Minutes 15    METs 2.09      Prescription Details   Frequency (times per week) 3    Duration Progress to 30 minutes of continuous aerobic without signs/symptoms of physical distress       Intensity   THRR 40-80% of Max Heartrate 99-126    Ratings of Perceived Exertion 11-13    Perceived Dyspnea 0-4      Progression   Progression Continue to progress workloads to maintain intensity without signs/symptoms of physical distress.      Resistance Training   Training Prescription Yes    Weight 3 lb    Reps 10-15             Perform Capillary Blood Glucose checks as needed.  Exercise Prescription Changes:   Exercise Prescription Changes     Row Name 02/21/22 1200 03/13/22 1500 03/20/22 1100 03/28/22 1300 04/09/22 1400     Response to Exercise   Blood Pressure (Admit) 126/74 122/66 -- 128/60 122/60   Blood Pressure (Exercise) 186/84 136/72 -- 142/70 --   Blood Pressure (Exit) 164/80 122/60 -- 110/66 116/60   Heart Rate (Admit) 71 bpm 70 bpm -- 72 bpm 73 bpm   Heart Rate (Exercise) 109 bpm 120 bpm -- 115 bpm 101 bpm   Heart Rate (Exit) 71 bpm 81 bpm -- 72 bpm 87 bpm   Oxygen Saturation (Admit) 97 % -- -- -- --   Oxygen Saturation (Exercise) 97 % -- -- -- --   Rating of Perceived Exertion (Exercise) 15 14 -- 15 15   Perceived Dyspnea (Exercise) 4 -- -- -- --   Symptoms SOB, bilateral knee pain 4/10 none -- none none   Comments walk test results 2nd full week of exercise -- -- --   Duration -- Progress to 30 minutes of  aerobic without signs/symptoms of physical distress -- Progress to 30 minutes of  aerobic without  signs/symptoms of physical distress Continue with 30 min of aerobic exercise without signs/symptoms of physical distress.   Intensity -- THRR unchanged -- THRR unchanged THRR unchanged     Progression   Progression -- Continue to progress workloads to maintain intensity without signs/symptoms of physical distress. -- Continue to progress workloads to maintain intensity without signs/symptoms of physical distress. Continue to progress workloads to maintain intensity without signs/symptoms of physical distress.   Average METs -- 2.18 -- 2.37 2.55      Resistance Training   Training Prescription -- Yes -- Yes Yes   Weight -- 3 lb -- 3 lb 3 lb   Reps -- 10-15 -- 10-15 10-15     Interval Training   Interval Training -- No -- No No     Treadmill   MPH -- 1.7 -- -- 1.6   Grade -- 0 -- -- 0   Minutes -- 15 -- -- 15   METs -- 2.3 -- -- 2.23     Recumbant Bike   Level -- 3 -- 5 4   Watts -- 20 -- 33 32   Minutes -- 15 -- 15 15   METs -- 2.55 -- 2.91 2.88     Recumbant Elliptical   Level -- 1 -- 1 --   Minutes -- 15 -- 15 --   METs -- 1.6 -- 1.7 --     REL-XR   Level -- 1 -- 4 --   Minutes -- 15 -- 15 --   METs -- 1.5 -- 3.2 --     T5 Nustep   Level -- 2 -- 3 --   Minutes -- 15 -- 15 --   METs -- 1.8 -- 2 --     Track   Laps -- 16 -- 23 --   Minutes -- 15 -- 15 --   METs -- 1.87 -- 2.25 --     Home Exercise Plan   Plans to continue exercise at -- -- Home (comment)  walking, staff videos Home (comment)  walking, staff videos Home (comment)  walking, staff videos   Frequency -- -- Add 2 additional days to program exercise sessions. Add 2 additional days to program exercise sessions. Add 2 additional days to program exercise sessions.   Initial Home Exercises Provided -- -- 03/20/22 03/20/22 03/20/22     Oxygen   Maintain Oxygen Saturation -- 88% or higher -- 88% or higher 88% or higher            Exercise Comments:   Exercise Goals and Review:   Exercise Goals     Row Name 02/21/22 1233             Exercise Goals   Increase Physical Activity Yes       Intervention Provide advice, education, support and counseling about physical activity/exercise needs.;Develop an individualized exercise prescription for aerobic and resistive training based on initial evaluation findings, risk stratification, comorbidities and participant's personal goals.       Expected Outcomes Short Term: Attend rehab on a regular basis to increase amount of physical activity.;Long Term: Add in home exercise to make exercise part of  routine and to increase amount of physical activity.;Long Term: Exercising regularly at least 3-5 days a week.       Increase Strength and Stamina Yes       Intervention Provide advice, education, support and counseling about physical activity/exercise needs.;Develop an individualized exercise prescription for aerobic and resistive training based on initial evaluation  findings, risk stratification, comorbidities and participant's personal goals.       Expected Outcomes Short Term: Increase workloads from initial exercise prescription for resistance, speed, and METs.;Short Term: Perform resistance training exercises routinely during rehab and add in resistance training at home;Long Term: Improve cardiorespiratory fitness, muscular endurance and strength as measured by increased METs and functional capacity (6MWT)       Able to understand and use rate of perceived exertion (RPE) scale Yes       Intervention Provide education and explanation on how to use RPE scale       Expected Outcomes Short Term: Able to use RPE daily in rehab to express subjective intensity level;Long Term:  Able to use RPE to guide intensity level when exercising independently       Able to understand and use Dyspnea scale Yes       Intervention Provide education and explanation on how to use Dyspnea scale       Expected Outcomes Short Term: Able to use Dyspnea scale daily in rehab to express subjective sense of shortness of breath during exertion;Long Term: Able to use Dyspnea scale to guide intensity level when exercising independently       Knowledge and understanding of Target Heart Rate Range (THRR) Yes       Intervention Provide education and explanation of THRR including how the numbers were predicted and where they are located for reference       Expected Outcomes Short Term: Able to state/look up THRR;Long Term: Able to use THRR to govern intensity when exercising independently;Short Term: Able to use daily as guideline for  intensity in rehab       Able to check pulse independently Yes       Intervention Provide education and demonstration on how to check pulse in carotid and radial arteries.;Review the importance of being able to check your own pulse for safety during independent exercise       Expected Outcomes Short Term: Able to explain why pulse checking is important during independent exercise;Long Term: Able to check pulse independently and accurately       Understanding of Exercise Prescription Yes       Intervention Provide education, explanation, and written materials on patient's individual exercise prescription       Expected Outcomes Short Term: Able to explain program exercise prescription;Long Term: Able to explain home exercise prescription to exercise independently                Exercise Goals Re-Evaluation :  Exercise Goals Re-Evaluation     Row Name 03/13/22 1531 03/13/22 1533 03/20/22 1121 03/28/22 1324 04/09/22 1441     Exercise Goal Re-Evaluation   Exercise Goals Review Increase Physical Activity;Increase Strength and Stamina;Understanding of Exercise Prescription;Able to understand and use Dyspnea scale;Able to check pulse independently;Able to understand and use rate of perceived exertion (RPE) scale;Knowledge and understanding of Target Heart Rate Range (THRR) Increase Physical Activity;Increase Strength and Stamina;Understanding of Exercise Prescription Increase Physical Activity;Increase Strength and Stamina;Understanding of Exercise Prescription Increase Physical Activity;Increase Strength and Stamina;Understanding of Exercise Prescription Increase Physical Activity;Increase Strength and Stamina;Understanding of Exercise Prescription   Comments Reviewed RPE scale, THR and program prescription with pt today.  Pt voiced understanding and was given a copy of goals to take home. Juliann Pulse is doing well for the first couple of weeks she has been here. She has been able to work at her initial  exercise prescription and was even able to work at level  3 on the recumbent bike. She was able to walk 16 laps on the track. She has been hitting her THR each session. Will continue to monitor as she progresses. Juliann Pulse is doing well in rehab.  Today she notes increased fatigue and sob.  She was encouraged to talk to her doctor and take an extra lasix.  Reviewed home exercise with pt today.  Pt plans to walk and use staff videos at home for exercise.  Reviewed THR, pulse, RPE, sign and symptoms, pulse oximetery and when to call 911 or MD.  Also discussed weather considerations and indoor options.  Pt voiced understanding. Juliann Pulse continues to do well in rehab. She recently increased her overall average MET level to 2.37 METs. She also improved to level 5 on the recumbent bike and level 4 on the XR. She also walked up to 23 laps on the track! We will continue to monitor er progress in the program. Juliann Pulse has had 1 session since last review due Thanksgiving.  She tried out the treadmill again at a 1.6 mph speed. She would benefit from adding a small incline of 0.5%. She worked up to 32 watts on the recumbent bike. Will continue to monitor.   Expected Outcomes Short: Use RPE daily to regulate intensity.  Long: Follow program prescription in THR. Short: Continue to follow exercise prescription and strive for 20 laps on track Long: Continue to build up overall strength and stamina Short: Start to add in more exercise at home and try chair exercises  Long: conitnue to improve stamina Short: Continue to push for more laps on the track. Long: Continue to increase strength and stamina. Short: Add 0.5% incline to treadmill Long: Continue to increase overall MET level            Discharge Exercise Prescription (Final Exercise Prescription Changes):  Exercise Prescription Changes - 04/09/22 1400       Response to Exercise   Blood Pressure (Admit) 122/60    Blood Pressure (Exit) 116/60    Heart Rate (Admit) 73 bpm     Heart Rate (Exercise) 101 bpm    Heart Rate (Exit) 87 bpm    Rating of Perceived Exertion (Exercise) 15    Symptoms none    Duration Continue with 30 min of aerobic exercise without signs/symptoms of physical distress.    Intensity THRR unchanged      Progression   Progression Continue to progress workloads to maintain intensity without signs/symptoms of physical distress.    Average METs 2.55      Resistance Training   Training Prescription Yes    Weight 3 lb    Reps 10-15      Interval Training   Interval Training No      Treadmill   MPH 1.6    Grade 0    Minutes 15    METs 2.23      Recumbant Bike   Level 4    Watts 32    Minutes 15    METs 2.88      Home Exercise Plan   Plans to continue exercise at Home (comment)   walking, staff videos   Frequency Add 2 additional days to program exercise sessions.    Initial Home Exercises Provided 03/20/22      Oxygen   Maintain Oxygen Saturation 88% or higher             Nutrition:  Target Goals: Understanding of nutrition guidelines, daily intake of sodium <1537m, cholesterol <2029m calories  30% from fat and 7% or less from saturated fats, daily to have 5 or more servings of fruits and vegetables.  Education: All About Nutrition: -Group instruction provided by verbal, written material, interactive activities, discussions, models, and posters to present general guidelines for heart healthy nutrition including fat, fiber, MyPlate, the role of sodium in heart healthy nutrition, utilization of the nutrition label, and utilization of this knowledge for meal planning. Follow up email sent as well. Written material given at graduation. Flowsheet Row Cardiac Rehab from 03/28/2022 in [facility] Cardiac and Pulmonary Rehab  Education need identified 02/21/22  Date 03/28/22  Educator [place]  Instruction Review Code 1- Verbalizes Understanding       Biometrics:  Pre Biometrics - 02/21/22 1233       Pre Biometrics   Height 5' 5.5"  (1.664 m)    Weight 257 lb 1.6 oz (116.6 kg)    Waist Circumference 42 inches    Hip Circumference 52.5 inches    Waist to Hip Ratio 0.8 %    BMI (Calculated) 42.12    Single Leg Stand 7.2 seconds              Nutrition Therapy Plan and Nutrition Goals:  Nutrition Therapy & Goals - 02/21/22 1104       Nutrition Therapy   Diet Heart healthy, low Na, CKD stg3-4 MNT, Pulmonary MNT    Drug/Food Interactions Statins/Certain Fruits    Protein (specify units) 85-90g    Fiber 28 grams    Whole Grain Foods 3 servings    Saturated Fats 16 max. grams    Fruits and Vegetables 8 servings/day    Sodium 1.5 grams      Personal Nutrition Goals   Nutrition Goal ST: practice adding in protein to eat meal. Include, but limit high potassium foods. Practice batch cooking and easy to prep meals. LT: meet protein/calorie needs, continue to limit sodium <1.5g/day, follow MyPlate guidelines    Comments 81 y.o. F admitted to cardiac rehab s/p TAVR. PMHx includes AFib, chronic combined systolic and diastolic HF, COPD, hypothyroidism, CKD st 3, HLD, anemia, HTN. Relevant medications includes lipitor, biotin, calcium-magnesium-zinc, vit D3, CoQ10, chromium, furosemide, probiotics, magnesium, MVI, K+, zinc, synthoid. Recent relevant labs: BMP: creatinine 1.5, GFR 33, potassium 4.5. TSH: 9.219. CBC: hemoglobin 11. Her food allergies includes persimmons. Juliann Pulse reports keeping a low salt diet and trying her best to choose heart healthy foods and has done so for a long time. She reports that recently her appetite has been lower which has also caused her to not be interested in cooking. She likes to make PB sandwiches on whole wheat bread, getting food from [employer] like soup and salads, cottage cheese, greek yogurt, as well as lots of fruits/vegetables. Juliann Pulse reports liking leafy greens, but reports she can't have them or needs to have a consistent amount with elliquis - discussed how that would be for coumadin/warfarin  and not for elliquis; she reports she has been on warfarin previously. Reviewed general heart healthy eating and discussed CKD stg 3-4 MNT as well as pulmonary MNT. Discussed the importance of meeting calorie and protein needs to maintain lean muscle tissue - encouraged her to have at least one good source of protein at each meal: cottage cheese, nuts/seeds or nut/seed butter, greek yogurt, eggs, beans/lentils, fish, chicken, meat (less than 2-3x/week); if she bakes a potato she can top it with greek yogurt or smash white beans into it, she could add nuts to her cereal in the  morning, or have an egg as part of a snack.  She reports that she will sometimes include ensure to help meet her nutrient needs - discussed how that can help, but to be cautious of the potassium in there and to adjust high potassium foods with what her blood work shows as she is already on potassium with her diuretic and it is encouagred to get in potassium rich foods like whole grains, beans and lentils in a heart healhty diet. Discussed ingredient prepping and batch meal prepping like chili or soup frozen in individual servings for an easy meal. Provided handouts.      Intervention Plan   Intervention Prescribe, educate and counsel regarding individualized specific dietary modifications aiming towards targeted core components such as weight, hypertension, lipid management, diabetes, heart failure and other comorbidities.;Nutrition handout(s) given to patient.    Expected Outcomes Short Term Goal: Understand basic principles of dietary content, such as calories, fat, sodium, cholesterol and nutrients.;Short Term Goal: A plan has been developed with personal nutrition goals set during dietitian appointment.;Long Term Goal: Adherence to prescribed nutrition plan.             Nutrition Assessments:  MEDIFICTS Score Key: ?70 Need to make dietary changes  40-70 Heart Healthy Diet ? 40 Therapeutic Level Cholesterol Diet  Flowsheet  Row Cardiac Rehab from 02/21/2022 in [facility] Cardiac and Pulmonary Rehab  Picture Your Plate Total Score on Admission 87      Picture Your Plate Scores: <74 Unhealthy dietary pattern with much room for improvement. 41-50 Dietary pattern unlikely to meet recommendations for good health and room for improvement. 51-60 More healthful dietary pattern, with some room for improvement.  >60 Healthy dietary pattern, although there may be some specific behaviors that could be improved.    Nutrition Goals Re-Evaluation:  Nutrition Goals Re-Evaluation     [place] Name 03/20/22 1118             Goals   Nutrition Goal ST: practice adding in protein to eat meal. Include, but limit high potassium foods. Practice batch cooking and easy to prep meals. LT: meet protein/calorie needs, continue to limit sodium <1.5g/day, follow MyPlate guidelines       Comment Juliann Pulse is doing well in rehab.  She is feeling confident in her diet.  She had stir fry last night .  She notes that she has been eating later with staying up later.  She is trying to add in more protein. She has tried the batch cooking to ease meal prep.  She is also working on portion control.  She is watching her sodium more and avoiding adding extra fat to her meals.  She is trying to add in more vegetables to her meals too.       Expected Outcome Short: Conitnue to use batch cooking Long: Continue to work on portion control.                Nutrition Goals Discharge (Final Nutrition Goals Re-Evaluation):  Nutrition Goals Re-Evaluation - 03/20/22 1118       Goals   Nutrition Goal ST: practice adding in protein to eat meal. Include, but limit high potassium foods. Practice batch cooking and easy to prep meals. LT: meet protein/calorie needs, continue to limit sodium <1.5g/day, follow MyPlate guidelines    Comment Juliann Pulse is doing well in rehab.  She is feeling confident in her diet.  She had stir fry last night .  She notes that she has been eating  later  with staying up later.  She is trying to add in more protein. She has tried the batch cooking to ease meal prep.  She is also working on portion control.  She is watching her sodium more and avoiding adding extra fat to her meals.  She is trying to add in more vegetables to her meals too.    Expected Outcome Short: Conitnue to use batch cooking Long: Continue to work on portion control.             Psychosocial: Target Goals: Acknowledge presence or absence of significant depression and/or stress, maximize coping skills, provide positive support system. Participant is able to verbalize types and ability to use techniques and skills needed for reducing stress and depression.   Education: Stress, Anxiety, and Depression - Group verbal and visual presentation to define topics covered.  Reviews how body is impacted by stress, anxiety, and depression.  Also discusses healthy ways to reduce stress and to treat/manage anxiety and depression.  Written material given at graduation. Flowsheet Row Cardiac Rehab from 03/28/2022 in [facility] Cardiac and Pulmonary Rehab  Date 03/07/22  Educator [facility]  Instruction Review Code 1- [employer] Understanding       Education: Sleep Hygiene -Provides group verbal and written instruction about how sleep can affect your health.  Define sleep hygiene, discuss sleep cycles and impact of sleep habits. Review good sleep hygiene tips.    Initial Review & Psychosocial Screening:  Initial Psych Review & Screening - 02/13/22 1012       Initial Review   Current issues with Current Stress Concerns;Current Sleep Concerns    Source of Stress Concerns Chronic Illness;Unable to participate in former interests or hobbies      E[place]? Yes   friends and church group     Barriers   Psychosocial barriers to participate in program There are no identifiable barriers or psychosocial needs.;The patient should benefit from training in stress  management and relaxation.      Screening Interventions   Interventions Encouraged to exercise;Provide feedback about the scores to participant;To provide support and resources with identified psychosocial needs    Expected Outcomes Short Term goal: Utilizing psychosocial counselor, staff and physician to assist with identification of specific Stressors or current issues interfering with healing process. Setting desired goal for each stressor or current issue identified.;Long Term Goal: Stressors or current issues are controlled or eliminated.;Short Term goal: Identification and review with participant of any Quality of Life or Depression concerns found by scoring the questionnaire.;Long Term goal: The participant improves quality of Life and PHQ9 Scores as seen by post scores and/or verbalization of changes             Quality of Life Scores:   Quality of Life - 02/21/22 1234       Quality of Life   Select Quality of Life      Quality of Life Scores   Health/Function Pre 22.35 %    Socioeconomic Pre 24.43 %    Psych/Spiritual Pre 27.21 %    Family Pre --   no answers   GLOBAL Pre 24.15 %            Scores of 19 and below usually indicate a poorer quality of life in these areas.  A difference of  2-3 points is a clinically meaningful difference.  A difference of 2-3 points in the total score of the Quality of Life Index has been associated with significant improvement  in overall quality of life, self-image, physical symptoms, and general health in studies assessing change in quality of life.  PHQ-9: Review Flowsheet  More data exists      03/20/2022 02/21/2022 07/14/2021 06/02/2021 09/19/2020  Depression screen PHQ 2/9  Decreased Interest 1 1 0 1 1  Down, Depressed, Hopeless 1 0 0 0 1  PHQ - 2 Score 2 1 0 1 2  Altered sleeping 2 2 - 3 1  Tired, decreased energy 3 2 - 2 1  Change in appetite 1 1 - 1 1  Feeling bad or failure about yourself  0 0 - 0 0  Trouble concentrating 0 1  - 0 1  Moving slowly or fidgety/restless 0 0 - 0 0  Suicidal thoughts 0 0 - 0 0  PHQ-9 Score 8 7 - 7 6  Difficult doing work/chores Somewhat difficult Somewhat difficult - Somewhat difficult Not difficult at all   Interpretation of Total Score  Total Score Depression Severity:  1-4 = Minimal depression, 5-9 = Mild depression, 10-14 = Moderate depression, 15-19 = Moderately severe depression, 20-27 = Severe depression   Psychosocial Evaluation and Intervention:  Psychosocial Evaluation - 02/13/22 1032       Psychosocial Evaluation & Interventions   Interventions Encouraged to exercise with the program and follow exercise prescription;Stress management education;Relaxation education    Comments 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 She quit her job last fall because of her declining health and trying to figure out what was going on. 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 She has been struggling with lymphedema with the complication of cellulitis this year as well. She prides herself on being independent and works hard to maintain that. She has a great support system of friends and church family. She mentioned that her sleep is a struggle with taking lasix and a lot on her mind. She feels very sluggish and doesn't know if it is related to all that she has been through these last few months or a form of depression. She is very aware of her mental health and has a strong resolve to push through it. She is wanting to gain back her strength by coming here. She is excited to get started.    Expected Outcomes Short: attend cardiac rehab for education and exercise. Long: develop and maintain positive self care habits.    Continue Psychosocial Services  Follow up required by staff              Psychosocial Re-Evaluation:  Psychosocial Re-Evaluation     [place] Name 03/20/22 1111             Psychosocial Re-Evaluation   Current issues with Current Stress Concerns;Current Depression       Comments Juliann Pulse is doing well in rehab.  She completed her PHQ which went up a point.  She is not sure she is depressed but she is constantly feeling tired.  She notes some achiness in her chest and goes to doctor next week.  She leans on her best friend for emotional support to each other.  She is tired of hearing about her friend's significant other when she did not listen to her advice previously.  Juliann Pulse feels like she is sleeping a lot during day.  She  lays down when her back hurts or gets tired and then sleeps hard.  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  She will mention it to her doctor.  She has also noted increased SOB today.  She is finding that she is getting more joy from TV time too.  We talked about exercising while watching TV.       Expected Outcomes Short: Talk to doctor about fatigue long: Continue to take care of herself       Interventions Encouraged to attend Cardiac Rehabilitation for the exercise       Continue Psychosocial Services  Follow up required by staff                Psychosocial Discharge (Final Psychosocial Re-Evaluation):  Psychosocial Re-Evaluation - 03/20/22 1111       Psychosocial Re-Evaluation   Current issues with Current Stress Concerns;Current Depression    Comments Juliann Pulse is doing well in rehab.  She completed her PHQ which went up a point.  She is not sure she is depressed but she is constantly feeling tired.  She notes some achiness in her chest and goes to doctor next week.  She leans on her best friend for emotional support to each other.  She is tired of hearing about her friend's significant other when she did not listen to her advice previously.  Juliann Pulse feels like she is sleeping a lot  during day.  She lays down when her back hurts or gets tired and then sleeps hard.  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  She will mention it to her doctor.  She has also noted increased SOB today.  She is finding that she is getting more joy from TV time too.  We talked about exercising while watching TV.    Expected Outcomes Short: Talk to doctor about fatigue long: Continue to take care of herself    Interventions Encouraged to attend Cardiac Rehabilitation for the exercise    Continue Psychosocial Services  Follow up required by staff             Vocational Rehabilitation: Provide vocational rehab assistance to qualifying candidates.   Vocational Rehab Evaluation & Intervention:  Vocational Rehab - 02/13/22 1010       Initial Vocational Rehab Evaluation & Intervention   Assessment shows need for Vocational Rehabilitation No             Education: Education Goals: Education classes will be provided on a variety of topics geared toward better understanding of heart health and risk factor modification. Participant will state understanding/return demonstration of topics presented as noted by education test scores.  Learning Barriers/Preferences:  Learning Barriers/Preferences - 02/13/22 1007       Learning Barriers/Preferences   Learning Barriers Sight    Learning Preferences Individual Instruction             General Cardiac Education Topics:  AED/CPR: - Group verbal and written instruction with the use of models to demonstrate the basic use of the AED with the basic ABC's of resuscitation.   Anatomy and Cardiac Procedures: - Group verbal and visual presentation and models provide information about basic cardiac anatomy and function. Reviews the testing methods done to diagnose heart disease and the outcomes of the test results. Describes the treatment choices: Medical Management, Angioplasty, or Coronary  Bypass Surgery for treating various heart conditions including Myocardial Infarction, Angina, Valve Disease, and Cardiac Arrhythmias.  Written material given at graduation. Flowsheet Row Cardiac Rehab from 03/28/2022 in [facility] Cardiac and Pulmonary Rehab  Education need identified 02/21/22       Medication Safety: - Group verbal and visual instruction to review commonly prescribed medications for heart and lung disease. Reviews the medication, class of the drug, and side effects. Includes the steps to properly store meds and maintain the prescription regimen.  Written material given at graduation.   Intimacy: - Group verbal instruction through game format to discuss how heart and lung disease can affect sexual intimacy. Written material given at graduation.. Flowsheet Row Cardiac Rehab from 03/28/2022 in [facility] Cardiac and Pulmonary Rehab  Date 03/21/22  Educator [facility]  Instruction Review Code 1- Verbalizes Understanding       Know Your Numbers and Heart Failure: - Group verbal and visual instruction to discuss disease risk factors for cardiac and pulmonary disease and treatment options.  Reviews associated critical values for Overweight/Obesity, Hypertension, Cholesterol, and Diabetes.  Discusses basics of heart failure: signs/symptoms and treatments.  Introduces Heart Failure Zone chart for action plan for heart failure.  Written material given at graduation.   Infection Prevention: - Provides verbal and written material to individual with discussion of infection control including proper hand washing and proper equipment cleaning during exercise session. Flowsheet Row Cardiac Rehab from 03/28/2022 in [facility] Cardiac and Pulmonary Rehab  Date 02/21/22  Educator [facility]  Instruction Review Code 1- Verbalizes Understanding       Falls Prevention: - Provides verbal and written material to individual with discussion of falls prevention and safety. Flowsheet Row Cardiac Rehab from 03/28/2022 in  [facility] Cardiac and Pulmonary Rehab  Date 02/21/22  Educator [facility]  Instruction Review Code 1- Verbalizes Understanding       Other: -Provides group and verbal instruction on various topics (see comments)   Knowledge Questionnaire Score:  Knowledge Questionnaire Score - 02/21/22 1235       Knowledge Questionnaire Score   Pre Score 21/26             Core Components/Risk Factors/Patient Goals at Admission:  Personal Goals and Risk Factors at Admission - 02/21/22 1235       Core Components/Risk Factors/Patient Goals on Admission    Weight Management Yes;Weight Loss;Obesity    Intervention Weight Management: Develop a combined nutrition and exercise program designed to reach desired caloric intake, while maintaining appropriate intake of nutrient and fiber, sodium and fats, and appropriate energy expenditure required for the weight goal.;Weight Management: Provide education and appropriate resources to help participant work on and attain dietary goals.;Weight Management/Obesity: Establish reasonable short term and long term weight goals.;Obesity: Provide education and appropriate resources to help participant work on and attain dietary goals.    Admit Weight 257 lb 1.6 oz (116.6 kg)    Goal Weight: Short Term 250 lb (113.4 kg)    Goal Weight: Long Term 240 lb (108.9 kg)    Expected Outcomes Short Term: Continue to assess and modify interventions until short term weight is achieved;Long Term: Adherence to nutrition and physical activity/exercise program aimed toward attainment of established weight goal;Weight Loss: Understanding of general recommendations for a balanced deficit meal plan, which promotes 1-2 lb weight loss per week and includes a negative energy balance of [phone] kcal/d;Understanding recommendations for meals to include 15-35% energy as protein, 25-35% energy from fat, 35-60% energy from carbohydrates, less than 224m of dietary cholesterol,  20-35 gm of total fiber  daily;Understanding of distribution of calorie intake throughout the day with the consumption of 4-5 meals/snacks    Heart Failure Yes    Intervention Provide a combined exercise and nutrition program that is supplemented with education, support and counseling about heart failure. Directed toward relieving symptoms such as shortness of breath, decreased exercise tolerance, and extremity edema.    Expected Outcomes Improve functional capacity of life;Short term: Attendance in program 2-3 days a week with increased exercise capacity. Reported lower sodium intake. Reported increased fruit and vegetable intake. Reports medication compliance.;Short term: Daily weights obtained and reported for increase. Utilizing diuretic protocols set by physician.;Long term: Adoption of self-care skills and reduction of barriers for early signs and symptoms recognition and intervention leading to self-care maintenance.    Hypertension Yes    Intervention Provide education on lifestyle modifcations including regular physical activity/exercise, weight management, moderate sodium restriction and increased consumption of fresh fruit, vegetables, and low fat dairy, alcohol moderation, and smoking cessation.;Monitor prescription use compliance.    Expected Outcomes Short Term: Continued assessment and intervention until BP is < 140/68m HG in hypertensive participants. < 130/891mHG in hypertensive participants with diabetes, heart failure or chronic kidney disease.;Long Term: Maintenance of blood pressure at goal levels.    Lipids Yes    Intervention Provide education and support for participant on nutrition & aerobic/resistive exercise along with prescribed medications to achieve LDL <7013mHDL >13m19m  Expected Outcomes Short Term: Participant states understanding of desired cholesterol values and is compliant with medications prescribed. Participant is following exercise prescription and nutrition guidelines.;Long Term:  Cholesterol controlled with medications as prescribed, with individualized exercise RX and with personalized nutrition plan. Value goals: LDL < 70mg78mL > 40 mg.             Education:Diabetes - Individual verbal and written instruction to review signs/symptoms of diabetes, desired ranges of glucose level fasting, after meals and with exercise. Acknowledge that pre and post exercise glucose checks will be done for 3 sessions at entry of program.   Core Components/Risk Factors/Patient Goals Review:   Goals and Risk Factor Review     Row Name 03/20/22 1115             Core Components/Risk Factors/Patient Goals Review   Personal Goals Review Weight Management/Obesity;Hypertension;Lipids;Heart Failure       Review KathyJuliann Pulseoing well in rehab.  She notes an increase in weight today and increased SOB today.  She is going to take an extra lasix today to see if it helps.  She notes that sometimes she will forget to take it on rehab days as she waits until she gets home to take it.  She has a follow up next week with her doctor and wants to talk about her low energy levels too.       Expected Outcomes Short: Get weight and SOB back to normal Long: Continue to monitor heart failure                Core Components/Risk Factors/Patient Goals at Discharge (Final Review):   Goals and Risk Factor Review - 03/20/22 1115       Core Components/Risk Factors/Patient Goals Review   Personal Goals Review Weight Management/Obesity;Hypertension;Lipids;Heart Failure    Review KathyJuliann Pulseoing well in rehab.  She notes an increase in weight today and increased SOB today.  She is going to take an extra lasix today to see if it helps.  She  notes that sometimes she will forget to take it on rehab days as she waits until she gets home to take it.  She has a follow up next week with her doctor and wants to talk about her low energy levels too.    Expected Outcomes Short: Get weight and SOB back to normal  Long: Continue to monitor heart failure             ITP Comments:  ITP Comments     Row Name 02/13/22 1003 02/21/22 1229 03/14/22 0917 04/11/22 0746     ITP Comments Initial phone call completed. Diagnosis can be found in [facility] 7/24. EP Orientation scheduled for Wednesday 10/11 at 9am. Completed 6MWT and gym orientation. Initial ITP created and sent for review to Dr. Emily Filbert, Medical Director. 30 Day review completed. Medical Director ITP review done, changes made as directed, and signed approval by Medical Director.   NEW TO PROGRAM 30 Day review completed. Medical Director ITP review done, changes made as directed, and signed approval by Medical Director.             Comments:

## 2022-04-13 ENCOUNTER — Encounter: Payer: Medicare Other | Attending: Internal Medicine | Admitting: *Deleted

## 2022-04-13 DIAGNOSIS — Z48812 Encounter for surgical aftercare following surgery on the circulatory system: Secondary | ICD-10-CM | POA: Diagnosis not present

## 2022-04-13 DIAGNOSIS — Z952 Presence of prosthetic heart valve: Secondary | ICD-10-CM | POA: Diagnosis present

## 2022-04-13 NOTE — Progress Notes (Signed)
Daily Session Note  Patient Details  Name: Mary Kennedy MRN: [id number] Date of Birth: 04/14/1941 Referring Provider:   Flowsheet Row Cardiac Rehab from 02/21/2022 in [facility] Cardiac and Pulmonary Rehab  Referring Provider Katrine Coho MD       Encounter Date: 04/13/2022  Check In:  Session Check In - 04/13/22 1142       Check-In   Supervising physician immediately available to respond to emergencies See telemetry face sheet for immediately available ER MD    Location [facility]-Cardiac & Pulmonary Rehab    Staff Present Justin Mend, RCP,RRT,BSRT;Adaysha Dubinsky [place], MA, RCEP, CCRP, CCET;Marvell Fuller, PhD, RN, CNS, CEN    Virtual Visit No    Medication changes reported     No    Fall or balance concerns reported    No    Warm-up and Cool-down Performed on first and last piece of equipment    Resistance Training Performed Yes    VAD Patient? No    PAD/SET Patient? No      Pain Assessment   Currently in Pain? No/denies                Social History   Tobacco Use  Smoking Status Former   Types: Cigarettes   Quit date: 05/13/1996   Years since quitting: 25.9  Smokeless Tobacco Never    Goals Met:  Proper associated with RPD/PD & O2 Sat Independence with exercise equipment Exercise tolerated well No report of concerns or symptoms today Strength training completed today  Goals Unmet:  Not Applicable  Comments: Pt able to follow exercise prescription today without complaint.  Will continue to monitor for progression.    Dr. Emily Filbert is Medical Director for [place].  Dr. Ottie Glazier is Medical Director for [facility] Pulmonary Rehabilitation.

## 2022-04-17 ENCOUNTER — Encounter: Payer: Medicare Other | Admitting: *Deleted

## 2022-04-17 DIAGNOSIS — Z952 Presence of prosthetic heart valve: Secondary | ICD-10-CM | POA: Diagnosis not present

## 2022-04-17 NOTE — Progress Notes (Signed)
Daily Session Note  Patient Details  Name: Mary Kennedy MRN: [id number] Date of Birth: 1940/09/25 Referring Provider:   Flowsheet Row Cardiac Rehab from 02/21/2022 in [facility] Cardiac and Pulmonary Rehab  Referring Provider Katrine Coho MD       Encounter Date: 04/17/2022  Check In:  Session Check In - 04/17/22 1132       Check-In   Supervising physician immediately available to respond to emergencies See telemetry face sheet for immediately available ER MD    Location [facility]-Cardiac & Pulmonary Rehab    Staff Present Antionette Fairy, BS, Exercise Physiologist;Jessica Metz, MA, RCEP, CCRP, CCET;Breelynn Bankert Sherryll Burger, RN BSN    Virtual Visit No    Medication changes reported     No    Fall or balance concerns reported    No    Warm-up and Cool-down Performed on first and last piece of equipment    Resistance Training Performed Yes    VAD Patient? No    PAD/SET Patient? No      Pain Assessment   Currently in Pain? No/denies                Social History   Tobacco Use  Smoking Status Former   Types: Cigarettes   Quit date: 05/13/1996   Years since quitting: 25.9  Smokeless Tobacco Never    Goals Met:  Independence with exercise equipment Exercise tolerated well No report of concerns or symptoms today Strength training completed today  Goals Unmet:  Not Applicable  Comments: Pt able to follow exercise prescription today without complaint.  Will continue to monitor for progression.    Dr. Emily Filbert is Medical Director for [place].  Dr. Ottie Glazier is Medical Director for [facility] Pulmonary Rehabilitation.

## 2022-04-18 ENCOUNTER — Encounter: Payer: Medicare Other | Admitting: *Deleted

## 2022-04-18 DIAGNOSIS — Z952 Presence of prosthetic heart valve: Secondary | ICD-10-CM | POA: Diagnosis not present

## 2022-04-18 NOTE — Progress Notes (Signed)
Daily Session Note  Patient Details  Name: Mary Kennedy MRN: [id number] Date of Birth: 03/26/1941 Referring Provider:   Flowsheet Row Cardiac Rehab from 02/21/2022 in [facility]  Referring Provider Callwood, Dwyane MD       Encounter Date: 04/18/2022  Check In:  Session Check In - 04/18/22 1250       Check-In   Supervising physician immediately available to respond to emergencies See telemetry face sheet for immediately available ER MD    Location [facility]    Staff Present Mary Jo Abernethy, RN, BSN, MA;Joseph Hood, RCP,RRT,BSRT;Noah Tickle, BS, Exercise Physiologist;Laureen Brown, BS, RRT, CPFT;Kelly Hayes, BS, ACSM CEP, Exercise Physiologist    Virtual Visit No    Medication changes reported     No    Fall or balance concerns reported    No    Tobacco Cessation No Change    Warm-up and Cool-down Performed on first and last piece of equipment    Resistance Training Performed Yes    VAD Patient? No    PAD/SET Patient? No      Pain Assessment   Currently in Pain? No/denies                Social History   Tobacco Use  Smoking Status Former   Types: Cigarettes   Quit date: 05/13/1996   Years since quitting: 25.9  Smokeless Tobacco Never    Goals Met:  Independence with exercise equipment Exercise tolerated well No report of concerns or symptoms today  Goals Unmet:  Not Applicable  Comments: Pt able to follow exercise prescription today without complaint.  Will continue to monitor for progression.    Dr. Mark Miller is Medical Director for [facility].  Dr. Fuad Aleskerov is Medical Director for [facility]. 

## 2022-04-20 ENCOUNTER — Ambulatory Visit
Admission: RE | Admit: 2022-04-20 | Discharge: 2022-04-20 | Disposition: A | Discharge status: Home / Self Care | Payer: Medicare Other | Source: Ambulatory Visit | Attending: Infectious Diseases | Admitting: Infectious Diseases

## 2022-04-20 ENCOUNTER — Other Ambulatory Visit: Payer: Self-pay | Admitting: Infectious Diseases

## 2022-04-20 DIAGNOSIS — R200 Anesthesia of skin: Secondary | ICD-10-CM | POA: Diagnosis present

## 2022-04-24 ENCOUNTER — Encounter: Payer: Medicare Other | Admitting: *Deleted

## 2022-04-24 VITALS — Ht 65.5 in | Wt 250.2 lb

## 2022-04-24 DIAGNOSIS — Z952 Presence of prosthetic heart valve: Secondary | ICD-10-CM

## 2022-04-24 NOTE — Progress Notes (Addendum)
Daily Session Note  Patient Details  Name: Mary Kennedy MRN: [id number] Date of Birth: 01-Feb-1941 Referring Provider:   Flowsheet Row Cardiac Rehab from 02/21/2022 in [facility]  Referring Provider Katrine Coho MD       Encounter Date: 04/24/2022  Check In:  Session Check In - 04/24/22 1109       Check-In   Supervising physician immediately available to respond to emergencies See telemetry face sheet for immediately available ER MD    Location [facility]    Staff Present Renita Papa, RN BSN;Jessica Luan Pulling, MA, RCEP, CCRP, Bertram Gala, MS, ACSM CEP, Exercise Physiologist;Noah Tickle, BS, Exercise Physiologist    Virtual Visit No    Medication changes reported     Yes    Comments added aspirin and increased eliquis    Fall or balance concerns reported    No    Tobacco Cessation No Change    Warm-up and Cool-down Performed on first and last piece of equipment    Resistance Training Performed Yes    VAD Patient? No    PAD/SET Patient? No      Pain Assessment   Currently in Pain? No/denies                Social History   Tobacco Use  Smoking Status Former   Types: Cigarettes   Quit date: 05/13/1996   Years since quitting: 25.9  Smokeless Tobacco Never    Goals Met:  Independence with exercise equipment Exercise tolerated well No report of concerns or symptoms today Strength training completed today  Goals Unmet:  Not Applicable  Comments: Pt able to follow exercise prescription today without complaint.  Will continue to monitor for progression.    Jamul Name 02/21/22 1229 04/24/22 1120       6 Minute Walk   Phase Initial Discharge    Distance 790 feet 938 feet    Distance % Change -- 18.7 %    Distance Feet Change -- 148 ft    Walk Time 5.3 minutes 6 minutes    # of Rest Breaks 2  22 sec, 20 sec 0    MPH 1.69 1.77    METS 1.16 1.08    RPE 15 15    Perceived  Dyspnea  4 3    VO2 Peak 4.05 3.78    Symptoms Yes (comment) Yes (comment)    Comments SOB, bilateral knee pain 4/10 SOB, bilateral knee pain 3/10    Resting HR 71 bpm 71 bpm    Resting BP 126/74 124/64    Resting Oxygen Saturation  97 % 95 %    Exercise Oxygen Saturation  during 6 min walk 97 % 96 %    Max Ex. HR 109 bpm 102 bpm    Max Ex. BP 186/84 146/74    2 Minute Post BP 164/80 --             Dr. Emily Filbert is Medical Director for [facility].  Dr. Ottie Glazier is Medical Director for [facility].

## 2022-04-24 NOTE — Patient Instructions (Signed)
Discharge Patient Instructions  Patient Details  Name: Mary Kennedy MRN: [id number] Date of Birth: 1940-09-22 Referring Provider:  Leonel Ramsay, MD   Number of Visits: 36  Reason for Discharge:  Patient reached a stable level of exercise. Patient independent in their exercise. Patient has met program and personal goals.  Smoking History:  Social History   Tobacco Use  Smoking Status Former   Types: Cigarettes   Quit date: 05/13/1996   Years since quitting: 25.9  Smokeless Tobacco Never    Diagnosis:  S/P TAVR (transcatheter aortic valve replacement)  Initial Exercise Prescription:  Initial Exercise Prescription - 02/21/22 1200       Date of Initial Exercise RX and Referring Provider   Date 02/21/22    Referring Provider Mary Coho MD      Oxygen   Maintain Oxygen Saturation 88% or higher      Treadmill   MPH 1.3    Grade 0    Minutes 15    METs 2      Recumbant Elliptical   Level 1    RPM 50    Minutes 15    METs 2      REL-XR   Level 1    Speed 50    Minutes 15    METs 2      Track   Laps 20    Minutes 15    METs 2.09      Prescription Details   Frequency (times per week) 3    Duration Progress to 30 minutes of continuous aerobic without signs/symptoms of physical distress      Intensity   THRR 40-80% of Max Heartrate 99-126    Ratings of Perceived Exertion 11-13    Perceived Dyspnea 0-4      Progression   Progression Continue to progress workloads to maintain intensity without signs/symptoms of physical distress.      Resistance Training   Training Prescription Yes    Weight 3 lb    Reps 10-15             Discharge Exercise Prescription (Final Exercise Prescription Changes):  Exercise Prescription Changes - 04/09/22 1400       Response to Exercise   Blood Pressure (Admit) 122/60    Blood Pressure (Exit) 116/60    Heart Rate (Admit) 73 bpm    Heart Rate (Exercise) 101 bpm    Heart Rate (Exit) 87 bpm     Rating of Perceived Exertion (Exercise) 15    Symptoms none    Duration Continue with 30 min of aerobic exercise without signs/symptoms of physical distress.    Intensity THRR unchanged      Progression   Progression Continue to progress workloads to maintain intensity without signs/symptoms of physical distress.    Average METs 2.55      Resistance Training   Training Prescription Yes    Weight 3 lb    Reps 10-15      Interval Training   Interval Training No      Treadmill   MPH 1.6    Grade 0    Minutes 15    METs 2.23      Recumbant Bike   Level 4    Watts 32    Minutes 15    METs 2.88      Home Exercise Plan   Plans to continue exercise at Home (comment)   walking, staff videos   Frequency Add 2 additional days to program  exercise sessions.    Initial Home Exercises Provided 03/20/22      Oxygen   Maintain Oxygen Saturation 88% or higher             Functional Capacity:  6 Minute Walk     Row Name 02/21/22 1229 04/24/22 1120       6 Minute Walk   Phase Initial Discharge    Distance 790 feet 938 feet    Distance % Change -- 18.7 %    Distance Feet Change -- 148 ft    Walk Time 5.3 minutes 6 minutes    # of Rest Breaks 2  22 sec, 20 sec 0    MPH 1.69 1.77    METS 1.16 1.08    RPE 15 15    Perceived Dyspnea  4 3    VO2 Peak 4.05 3.78    Symptoms Yes (comment) Yes (comment)    Comments SOB, bilateral knee pain 4/10 SOB, bilateral knee pain 3/10    Resting HR 71 bpm 71 bpm    Resting BP 126/74 124/64    Resting Oxygen Saturation  97 % 95 %    Exercise Oxygen Saturation  during 6 min walk 97 % 96 %    Max Ex. HR 109 bpm 102 bpm    Max Ex. BP 186/84 146/74    2 Minute Post BP 164/80 --              Nutrition & Weight - Outcomes:  Pre Biometrics - 02/21/22 1233       Pre Biometrics   Height 5' 5.5" (1.664 m)    Weight 257 lb 1.6 oz (116.6 kg)    Waist Circumference 42 inches    Hip Circumference 52.5 inches    Waist to Hip Ratio 0.8 %     BMI (Calculated) 42.12    Single Leg Stand 7.2 seconds             Post Biometrics - 04/24/22 1121        Post  Biometrics   Height 5' 5.5" (1.664 m)    Weight 250 lb 3.2 oz (113.5 kg)    Waist Circumference 42 inches    Hip Circumference 52.5 inches    Waist to Hip Ratio 0.8 %    BMI (Calculated) 40.99    Single Leg Stand 9.2 seconds             Nutrition:  Nutrition Therapy & Goals - 02/21/22 1104       Nutrition Therapy   Diet Heart healthy, low Na, CKD stg3-4 MNT, Pulmonary MNT    Drug/Food Interactions Statins/Certain Fruits    Protein (specify units) 85-90g    Fiber 28 grams    Whole Grain Foods 3 servings    Saturated Fats 16 max. grams    Fruits and Vegetables 8 servings/day    Sodium 1.5 grams      Personal Nutrition Goals   Nutrition Goal ST: practice adding in protein to eat meal. Include, but limit high potassium foods. Practice batch cooking and easy to prep meals. LT: meet protein/calorie needs, continue to limit sodium <1.5g/day, follow MyPlate guidelines    Comments 81 y.o. F admitted to cardiac rehab s/p TAVR. PMHx includes AFib, chronic combined systolic and diastolic HF, COPD, hypothyroidism, CKD st 3, HLD, anemia, HTN. Relevant medications includes lipitor, biotin, calcium-magnesium-zinc, vit D3, CoQ10, chromium, furosemide, probiotics, magnesium, MVI, K+, zinc, synthoid. Recent relevant labs: BMP: creatinine 1.5, GFR 33, potassium  4.5. TSH: 9.219. CBC: hemoglobin 11. Her food allergies includes persimmons. Mary Kennedy reports keeping a low salt diet and trying her best to choose heart healthy foods and has done so for a long time. She reports that recently her appetite has been lower which has also caused her to not be interested in cooking. She likes to make PB sandwiches on whole wheat bread, getting food from [employer] like soup and salads, cottage cheese, greek yogurt, as well as lots of fruits/vegetables. Mary Kennedy reports liking leafy greens, but reports she  can't have them or needs to have a consistent amount with elliquis - discussed how that would be for coumadin/warfarin and not for elliquis; she reports she has been on warfarin previously. Reviewed general heart healthy eating and discussed CKD stg 3-4 MNT as well as pulmonary MNT. Discussed the importance of meeting calorie and protein needs to maintain lean muscle tissue - encouraged her to have at least one good source of protein at each meal: cottage cheese, nuts/seeds or nut/seed butter, greek yogurt, eggs, beans/lentils, fish, chicken, meat (less than 2-3x/week); if she bakes a potato she can top it with greek yogurt or smash white beans into it, she could add nuts to her cereal in the morning, or have an egg as part of a snack.  She reports that she will sometimes include ensure to help meet her nutrient needs - discussed how that can help, but to be cautious of the potassium in there and to adjust high potassium foods with what her blood work shows as she is already on potassium with her diuretic and it is encouagred to get in potassium rich foods like whole grains, beans and lentils in a heart healhty diet. Discussed ingredient prepping and batch meal prepping like chili or soup frozen in individual servings for an easy meal. Provided handouts.      Intervention Plan   Intervention Prescribe, educate and counsel regarding individualized specific dietary modifications aiming towards targeted core components such as weight, hypertension, lipid management, diabetes, heart failure and other comorbidities.;Nutrition handout(s) given to patient.    Expected Outcomes Short Term Goal: Understand basic principles of dietary content, such as calories, fat, sodium, cholesterol and nutrients.;Short Term Goal: A plan has been developed with personal nutrition goals set during dietitian appointment.;Long Term Goal: Adherence to prescribed nutrition plan.           Goals reviewed with patient; copy given to  patient.

## 2022-04-25 ENCOUNTER — Encounter: Payer: Medicare Other | Admitting: *Deleted

## 2022-04-25 DIAGNOSIS — Z952 Presence of prosthetic heart valve: Secondary | ICD-10-CM | POA: Diagnosis not present

## 2022-04-25 NOTE — Progress Notes (Signed)
Daily Session Note  Patient Details  Name: Mary Kennedy MRN: [id number] Date of Birth: 02-17-1941 Referring Provider:   Flowsheet Row Cardiac Rehab from 02/21/2022 in [facility] Re[facility]  Referring Provider Katrine Coho MD       Encounter Date: 04/25/2022  Check In:  Session Check In - 04/25/22 1119       Check-In   Supervising physician immediately available to respond to emergencies See telemetry face sheet for immediately available ER MD    Location [facility]    Staff Present Darlyne Russian, RN, ADN;Meredith Sherryll Burger, RN BSN;Noah Tickle, BS, Exercise Physiologist    Virtual Visit No    Medication changes reported     No    Fall or balance concerns reported    No    Warm-up and Cool-down Performed on first and last piece of equipment    Resistance Training Performed Yes    VAD Patient? No    PAD/SET Patient? No      Pain Assessment   Currently in Pain? No/denies                Social History   Tobacco Use  Smoking Status Former   Types: Cigarettes   Quit date: 05/13/1996   Years since quitting: 25.9  Smokeless Tobacco Never    Goals Met:  Independence with exercise equipment Exercise tolerated well No report of concerns or symptoms today Strength training completed today  Goals Unmet:  Not Applicable  Comments: Pt able to follow exercise prescription today without complaint.  Will continue to monitor for progression.    Dr. Emily Filbert is Medical Director for [facility].  Dr. Ottie Glazier is Medical Director for [facility].

## 2022-04-27 ENCOUNTER — Encounter: Payer: Medicare Other | Admitting: *Deleted

## 2022-04-27 DIAGNOSIS — Z952 Presence of prosthetic heart valve: Secondary | ICD-10-CM

## 2022-04-27 NOTE — Progress Notes (Signed)
Daily Session Note  Patient Details  Name: Etna K Cavazos MRN: [id number] Date of Birth: 02/03/1941 Referring Provider:   Flowsheet Row Cardiac Rehab from 02/21/2022 in [facility]  Referring Provider Callwood, Dwyane MD       Encounter Date: 04/27/2022  Check In:  Session Check In - 04/27/22 1127       Check-In   Supervising physician immediately available to respond to emergencies See telemetry face sheet for immediately available ER MD    Location [facility]    Staff Present Susanne Bice, RN, BSN, CCRP;Jessica Hawkins, MA, RCEP, CCRP, CCET;Joseph Hood, RCP,RRT,BSRT    Virtual Visit No    Medication changes reported     No    Fall or balance concerns reported    No    Warm-up and Cool-down Performed on first and last piece of equipment    Resistance Training Performed Yes    VAD Patient? No    PAD/SET Patient? No      Pain Assessment   Currently in Pain? No/denies                Social History   Tobacco Use  Smoking Status Former   Types: Cigarettes   Quit date: 05/13/1996   Years since quitting: 25.9  Smokeless Tobacco Never    Goals Met:  Independence with exercise equipment Exercise tolerated well No report of concerns or symptoms today  Goals Unmet:  Not Applicable  Comments: Pt able to follow exercise prescription today without complaint.  Will continue to monitor for progression.    Dr. Mark Miller is Medical Director for [facility].  Dr. Fuad Aleskerov is Medical Director for [facility]. 

## 2022-05-01 ENCOUNTER — Encounter: Payer: Medicare Other | Admitting: *Deleted

## 2022-05-01 DIAGNOSIS — Z952 Presence of prosthetic heart valve: Secondary | ICD-10-CM

## 2022-05-01 NOTE — Progress Notes (Signed)
Daily Session Note  Patient Details  Name: Mary Kennedy MRN: [id number] Date of Birth: October 27, 1940 Referring Provider:   Flowsheet Row Cardiac Rehab from 02/21/2022 in [facility] Re[facility]  Referring Provider Katrine Coho MD       Encounter Date: 05/01/2022  Check In:  Session Check In - 05/01/22 1058       Check-In   Supervising physician immediately available to respond to emergencies See telemetry face sheet for immediately available ER MD    Location [facility]    Staff Present Renita Papa, RN BSN;Jessica Luan Pulling, MA, RCEP, CCRP, Bertram Gala, MS, ACSM CEP, Exercise Physiologist    Virtual Visit No    Medication changes reported     Yes    Comments increased thyroid med    Fall or balance concerns reported    No    Warm-up and Cool-down Performed on first and last piece of equipment    Resistance Training Performed Yes    VAD Patient? No    PAD/SET Patient? No      Pain Assessment   Currently in Pain? No/denies                Social History   Tobacco Use  Smoking Status Former   Types: Cigarettes   Quit date: 05/13/1996   Years since quitting: 25.9  Smokeless Tobacco Never    Goals Met:  Independence with exercise equipment Exercise tolerated well No report of concerns or symptoms today Strength training completed today  Goals Unmet:  Not Applicable  Comments: Pt able to follow exercise prescription today without complaint.  Will continue to monitor for progression.    Dr. Emily Filbert is Medical Director for [facility].  Dr. Ottie Glazier is Medical Director for [facility].

## 2022-05-02 ENCOUNTER — Encounter: Payer: Medicare Other | Admitting: *Deleted

## 2022-05-02 DIAGNOSIS — Z952 Presence of prosthetic heart valve: Secondary | ICD-10-CM

## 2022-05-02 NOTE — Progress Notes (Signed)
Cardiac Individual Treatment Plan  Patient Details  Name: Mary Kennedy MRN: [id number] Date of Birth: 1941/03/08 Referring Provider:   Flowsheet Row Cardiac Rehab from 02/21/2022 in [facility] Cardiac and Pulmonary Rehab  Referring Provider Katrine Coho MD       Initial Encounter Date:  Flowsheet Row Cardiac Rehab from 02/21/2022 in [facility] Cardiac and Pulmonary Rehab  Date 02/21/22       Visit Diagnosis: S/P TAVR (transcatheter aortic valve replacement)  Patient's Home Medications on Admission:  Current Outpatient Medications:    amiodarone (PACERONE) 200 MG tablet, Take 100 mg by mouth daily., Disp: , Rfl:    amLODipine (NORVASC) 10 MG tablet, Take by mouth., Disp: , Rfl:    apixaban (ELIQUIS) 2.5 MG TABS tablet, Take 2.5 mg by mouth 2 (two) times daily., Disp: , Rfl:    atorvastatin (LIPITOR) 20 MG tablet, Take 20 mg by mouth daily., Disp: , Rfl:    Biotin 1 MG CAPS, Take 1 capsule by mouth daily., Disp: , Rfl:    CALCIUM-MAGNESUIUM-ZINC 333-133-8.3 MG TABS, Take 2 tablets by mouth at bedtime., Disp: , Rfl:    Cholecalciferol (D3-1000) 25 MCG (1000 UT) capsule, Take 1,000 Units by mouth daily., Disp: , Rfl:    Chromium Picolinate 1000 MCG TABS, Take 1 tablet by mouth daily., Disp: , Rfl:    Coenzyme Q10 (CO Q 10 PO), Take 1 tablet by mouth daily., Disp: , Rfl:    furosemide (LASIX) 40 MG tablet, Take 1 tablet by mouth 2 (two) times daily., Disp: , Rfl:    LACTOBACILLUS PO, Take 1 capsule by mouth daily., Disp: , Rfl:    levothyroxine (SYNTHROID) 125 MCG tablet, Take 125 mcg by mouth daily before breakfast., Disp: , Rfl:    MAGNESIUM MALATE PO, Take 1,000 mg by mouth at bedtime., Disp: , Rfl:    metoprolol succinate (TOPROL-XL) 50 MG 24 hr tablet, Take 50 mg by mouth daily., Disp: , Rfl:    Multiple Vitamins-Minerals (MULTIVITAMIN MEN 50+ PO), Take 1 tablet by mouth daily., Disp: , Rfl:    potassium chloride (KLOR-CON) 20 MEQ packet, Take 20 mEq by mouth daily., Disp: , Rfl:     tiotropium (SPIRIVA HANDIHALER) 18 MCG inhalation capsule, Place into inhaler and inhale., Disp: , Rfl:    triamcinolone ointment (KENALOG) 0.1 %, Apply 1 Application topically 2 (two) times daily., Disp: , Rfl:    zinc gluconate 50 MG tablet, Take 50 mg by mouth daily., Disp: , Rfl:   Past Medical History: Past Medical History:  Diagnosis Date   Allergy    Aortic stenosis    Atrial fibrillation ([place])    Atypical squamous cells of undetermined significance on cytologic smear of vagina (ASC-US) 12/01/2014   s/p gynecology consultation 07/2010; s/p repeat pap smear with HPV.    Cellulitis    Heart murmur    Hyperlipidemia    Hypertension    Hypothyroidism    Thyroid disease    Wears dentures    partial lower, full upper    Tobacco Use: Social History   Tobacco Use  Smoking Status Former   Types: Cigarettes   Quit date: 05/13/1996   Years since quitting: 25.9  Smokeless Tobacco Never    Labs: Review Flowsheet  More data exists      Latest Ref Rng & Units 06/23/2018 10/19/2019 09/21/2020 06/05/2021 12/09/2021  Labs for ITP Cardiac and Pulmonary Rehab  Cholestrol 100 - 199 mg/dL 173  163  157  155  -  LDL (  calc) 0 - 99 mg/dL 110  102  95  96  -  HDL-C >39 mg/dL 44  41  46  45  -  Trlycerides 0 - 149 mg/dL 97  108  85  69  -  Hemoglobin A1c 4.8 - 5.6 % 6.0  5.5  - 5.4  -  Bicarbonate 20.0 - 28.0 mmol/L - - - - 30.9   O2 Saturation % - - - - 90.1      Exercise Target Goals: Exercise Program Goal: Individual exercise prescription set using results from initial 6 min walk test and THRR while considering  patient's activity barriers and safety.   Exercise Prescription Goal: Initial exercise prescription builds to 30-45 minutes a day of aerobic activity, 2-3 days per week.  Home exercise guidelines will be given to patient during program as part of exercise prescription that the participant will acknowledge.   Education: Aerobic Exercise: - Group verbal and visual  presentation on the components of exercise prescription. Introduces F.I.T.T principle from ACSM for exercise prescriptions.  Reviews F.I.T.T. principles of aerobic exercise including progression. Written material given at graduation. Flowsheet Row Cardiac Rehab from 05/02/2022 in [facility] Cardiac and Pulmonary Rehab  Education need identified 02/21/22  Date 03/21/22  Educator [facility]  Instruction Review Code 1- Verbalizes Understanding       Education: Resistance Exercise: - Group verbal and visual presentation on the components of exercise prescription. Introduces F.I.T.T principle from ACSM for exercise prescriptions  Reviews F.I.T.T. principles of resistance exercise including progression. Written material given at graduation.    Education: Exercise & Equipment Safety: - Individual verbal instruction and demonstration of equipment use and safety with use of the equipment. Flowsheet Row Cardiac Rehab from 05/02/2022 in [facility] Cardiac and Pulmonary Rehab  Date 02/21/22  Educator [facility]  Instruction Review Code 1- Verbalizes Understanding       Education: Exercise Physiology & General Exercise Guidelines: - Group verbal and written instruction with models to review the exercise physiology of the cardiovascular system and associated critical values. Provides general exercise guidelines with specific guidelines to those with heart or lung disease.  Flowsheet Row Cardiac Rehab from 05/02/2022 in [facility] Cardiac and Pulmonary Rehab  Date 03/14/22  Educator [facility]  Instruction Review Code 1- Verbalizes Understanding       Education: Flexibility, Balance, Mind/Body Relaxation: - Group verbal and visual presentation with interactive activity on the components of exercise prescription. Introduces F.I.T.T principle from ACSM for exercise prescriptions. Reviews F.I.T.T. principles of flexibility and balance exercise training including progression. Also discusses the mind body connection.  Reviews various  relaxation techniques to help reduce and manage stress (i.e. Deep breathing, progressive muscle relaxation, and visualization). Balance handout provided to take home. Written material given at graduation.   Activity Barriers & Risk Stratification:  Activity Barriers & Cardiac Risk Stratification - 02/21/22 1230       Activity Barriers & Cardiac Risk Stratification   Activity Barriers Joint Problems;Arthritis;Deconditioning;Balance Concerns;Shortness of Breath;Decreased Ventricular Function    Cardiac Risk Stratification High             6 Minute Walk:  6 Minute Walk     Row Name 02/21/22 1229 04/24/22 1120       6 Minute Walk   Phase Initial Discharge    Distance 790 feet 938 feet    Distance % Change -- 18.7 %    Distance Feet Change -- 148 ft    Walk Time 5.3 minutes 6 minutes    #  of Rest Breaks 2  22 sec, 20 sec 0    MPH 1.69 1.77    METS 1.16 1.08    RPE 15 15    Perceived Dyspnea  4 3    VO2 Peak 4.05 3.78    Symptoms Yes (comment) Yes (comment)    Comments SOB, bilateral knee pain 4/10 SOB, bilateral knee pain 3/10    Resting HR 71 bpm 71 bpm    Resting BP 126/74 124/64    Resting Oxygen Saturation  97 % 95 %    Exercise Oxygen Saturation  during 6 min walk 97 % 96 %    Max Ex. HR 109 bpm 102 bpm    Max Ex. BP 186/84 146/74    2 Minute Post BP 164/80 --             Oxygen Initial Assessment:   Oxygen Re-Evaluation:   Oxygen Discharge (Final Oxygen Re-Evaluation):   Initial Exercise Prescription:  Initial Exercise Prescription - 02/21/22 1200       Date of Initial Exercise RX and Referring Provider   Date 02/21/22    Referring Provider Katrine Coho MD      Oxygen   Maintain Oxygen Saturation 88% or higher      Treadmill   MPH 1.3    Grade 0    Minutes 15    METs 2      Recumbant Elliptical   Level 1    RPM 50    Minutes 15    METs 2      REL-XR   Level 1    Speed 50    Minutes 15    METs 2      Track   Laps 20     Minutes 15    METs 2.09      Prescription Details   Frequency (times per week) 3    Duration Progress to 30 minutes of continuous aerobic without signs/symptoms of physical distress      Intensity   THRR 40-80% of Max Heartrate 99-126    Ratings of Perceived Exertion 11-13    Perceived Dyspnea 0-4      Progression   Progression Continue to progress workloads to maintain intensity without signs/symptoms of physical distress.      Resistance Training   Training Prescription Yes    Weight 3 lb    Reps 10-15             Perform Capillary Blood Glucose checks as needed.  Exercise Prescription Changes:   Exercise Prescription Changes     Row Name 02/21/22 1200 03/13/22 1500 03/20/22 1100 03/28/22 1300 04/09/22 1400     Response to Exercise   Blood Pressure (Admit) 126/74 122/66 -- 128/60 122/60   Blood Pressure (Exercise) 186/84 136/72 -- 142/70 --   Blood Pressure (Exit) 164/80 122/60 -- 110/66 116/60   Heart Rate (Admit) 71 bpm 70 bpm -- 72 bpm 73 bpm   Heart Rate (Exercise) 109 bpm 120 bpm -- 115 bpm 101 bpm   Heart Rate (Exit) 71 bpm 81 bpm -- 72 bpm 87 bpm   Oxygen Saturation (Admit) 97 % -- -- -- --   Oxygen Saturation (Exercise) 97 % -- -- -- --   Rating of Perceived Exertion (Exercise) 15 14 -- 15 15   Perceived Dyspnea (Exercise) 4 -- -- -- --   Symptoms SOB, bilateral knee pain 4/10 none -- none none   Comments walk test results 2nd full week of  exercise -- -- --   Duration -- Progress to 30 minutes of  aerobic without signs/symptoms of physical distress -- Progress to 30 minutes of  aerobic without signs/symptoms of physical distress Continue with 30 min of aerobic exercise without signs/symptoms of physical distress.   Intensity -- THRR unchanged -- THRR unchanged THRR unchanged     Progression   Progression -- Continue to progress workloads to maintain intensity without signs/symptoms of physical distress. -- Continue to progress workloads to maintain  intensity without signs/symptoms of physical distress. Continue to progress workloads to maintain intensity without signs/symptoms of physical distress.   Average METs -- 2.18 -- 2.37 2.55     Resistance Training   Training Prescription -- Yes -- Yes Yes   Weight -- 3 lb -- 3 lb 3 lb   Reps -- 10-15 -- 10-15 10-15     Interval Training   Interval Training -- No -- No No     Treadmill   MPH -- 1.7 -- -- 1.6   Grade -- 0 -- -- 0   Minutes -- 15 -- -- 15   METs -- 2.3 -- -- 2.23     Recumbant Bike   Level -- 3 -- 5 4   Watts -- 20 -- 33 32   Minutes -- 15 -- 15 15   METs -- 2.55 -- 2.91 2.88     Recumbant Elliptical   Level -- 1 -- 1 --   Minutes -- 15 -- 15 --   METs -- 1.6 -- 1.7 --     REL-XR   Level -- 1 -- 4 --   Minutes -- 15 -- 15 --   METs -- 1.5 -- 3.2 --     T5 Nustep   Level -- 2 -- 3 --   Minutes -- 15 -- 15 --   METs -- 1.8 -- 2 --     Track   Laps -- 16 -- 23 --   Minutes -- 15 -- 15 --   METs -- 1.87 -- 2.25 --     Home Exercise Plan   Plans to continue exercise at -- -- Home (comment)  walking, staff videos Home (comment)  walking, staff videos Home (comment)  walking, staff videos   Frequency -- -- Add 2 additional days to program exercise sessions. Add 2 additional days to program exercise sessions. Add 2 additional days to program exercise sessions.   Initial Home Exercises Provided -- -- 03/20/22 03/20/22 03/20/22     Oxygen   Maintain Oxygen Saturation -- 88% or higher -- 88% or higher 88% or higher    Row Name 04/25/22 1400             Response to Exercise   Blood Pressure (Admit) 108/60       Blood Pressure (Exit) 112/64       Heart Rate (Admit) 78 bpm       Heart Rate (Exercise) 121 bpm       Heart Rate (Exit) 83 bpm       Rating of Perceived Exertion (Exercise) 14       Symptoms none       Duration Continue with 30 min of aerobic exercise without signs/symptoms of physical distress.       Intensity THRR unchanged          Progression   Progression Continue to progress workloads to maintain intensity without signs/symptoms of physical distress.       Average  METs 2.59         Resistance Training   Training Prescription Yes       Weight 5 lb       Reps 10-15         Interval Training   Interval Training No         Recumbant Elliptical   Level 1.4       Minutes 15       METs 1.5         REL-XR   Level 3       Minutes 15       METs 4         Track   Laps 21       Minutes 15       METs 2.14         Home Exercise Plan   Plans to continue exercise at Home (comment)  walking, staff videos       Frequency Add 2 additional days to program exercise sessions.       Initial Home Exercises Provided 03/20/22         Oxygen   Maintain Oxygen Saturation 88% or higher                Exercise Comments:   Exercise Goals and Review:   Exercise Goals     Row Name 02/21/22 1233             Exercise Goals   Increase Physical Activity Yes       Intervention Provide advice, education, support and counseling about physical activity/exercise needs.;Develop an individualized exercise prescription for aerobic and resistive training based on initial evaluation findings, risk stratification, comorbidities and participant's personal goals.       Expected Outcomes Short Term: Attend rehab on a regular basis to increase amount of physical activity.;Long Term: Add in home exercise to make exercise part of routine and to increase amount of physical activity.;Long Term: Exercising regularly at least 3-5 days a week.       Increase Strength and Stamina Yes       Intervention Provide advice, education, support and counseling about physical activity/exercise needs.;Develop an individualized exercise prescription for aerobic and resistive training based on initial evaluation findings, risk stratification, comorbidities and participant's personal goals.       Expected Outcomes Short Term: Increase workloads from  initial exercise prescription for resistance, speed, and METs.;Short Term: Perform resistance training exercises routinely during rehab and add in resistance training at home;Long Term: Improve cardiorespiratory fitness, muscular endurance and strength as measured by increased METs and functional capacity (6MWT)       Able to understand and use rate of perceived exertion (RPE) scale Yes       Intervention Provide education and explanation on how to use RPE scale       Expected Outcomes Short Term: Able to use RPE daily in rehab to express subjective intensity level;Long Term:  Able to use RPE to guide intensity level when exercising independently       Able to understand and use Dyspnea scale Yes       Intervention Provide education and explanation on how to use Dyspnea scale       Expected Outcomes Short Term: Able to use Dyspnea scale daily in rehab to express subjective sense of shortness of breath during exertion;Long Term: Able to use Dyspnea scale to guide intensity level when exercising independently       Knowledge and understanding  of Target Heart Rate Range (THRR) Yes       Intervention Provide education and explanation of THRR including how the numbers were predicted and where they are located for reference       Expected Outcomes Short Term: Able to state/look up THRR;Long Term: Able to use THRR to govern intensity when exercising independently;Short Term: Able to use daily as guideline for intensity in rehab       Able to check Kennedy independently Yes       Intervention Provide education and demonstration on how to check Kennedy in carotid and radial arteries.;Review the importance of being able to check your own Kennedy for safety during independent exercise       Expected Outcomes Short Term: Able to explain why Kennedy checking is important during independent exercise;Long Term: Able to check Kennedy independently and accurately       Understanding of Exercise Prescription Yes        Intervention Provide education, explanation, and written materials on patient's individual exercise prescription       Expected Outcomes Short Term: Able to explain program exercise prescription;Long Term: Able to explain home exercise prescription to exercise independently                Exercise Goals Re-Evaluation :  Exercise Goals Re-Evaluation     Row Name 03/13/22 1531 03/13/22 1533 03/20/22 1121 03/28/22 1324 04/09/22 1441     Exercise Goal Re-Evaluation   Exercise Goals Review Increase Physical Activity;Increase Strength and Stamina;Understanding of Exercise Prescription;Able to understand and use Dyspnea scale;Able to check Kennedy independently;Able to understand and use rate of perceived exertion (RPE) scale;Knowledge and understanding of Target Heart Rate Range (THRR) Increase Physical Activity;Increase Strength and Stamina;Understanding of Exercise Prescription Increase Physical Activity;Increase Strength and Stamina;Understanding of Exercise Prescription Increase Physical Activity;Increase Strength and Stamina;Understanding of Exercise Prescription Increase Physical Activity;Increase Strength and Stamina;Understanding of Exercise Prescription   Comments Reviewed RPE scale, THR and program prescription with pt today.  Pt voiced understanding and was given a copy of goals to take home. Mary Kennedy is doing well for the first couple of weeks she has been here. She has been able to work at her initial exercise prescription and was even able to work at level 3 on the recumbent bike. She was able to walk 16 laps on the track. She has been hitting her THR each session. Will continue to monitor as she progresses. Mary Kennedy is doing well in rehab.  Today she notes increased fatigue and sob.  She was encouraged to talk to her doctor and take an extra lasix.  Reviewed home exercise with pt today.  Pt plans to walk and use staff videos at home for exercise.  Reviewed THR, Kennedy, RPE, sign and symptoms, Kennedy  oximetery and when to call 911 or MD.  Also discussed weather considerations and indoor options.  Pt voiced understanding. Mary Kennedy continues to do well in rehab. She recently increased her overall average MET level to 2.37 METs. She also improved to level 5 on the recumbent bike and level 4 on the XR. She also walked up to 23 laps on the track! We will continue to monitor er progress in the program. Mary Kennedy has had 1 session since last review due Thanksgiving.  She tried out the treadmill again at a 1.6 mph speed. She would benefit from adding a small incline of 0.5%. She worked up to 32 watts on the recumbent bike. Will continue to monitor.   Expected Outcomes Short:  Use RPE daily to regulate intensity.  Long: Follow program prescription in THR. Short: Continue to follow exercise prescription and strive for 20 laps on track Long: Continue to build up overall strength and stamina Short: Start to add in more exercise at home and try chair exercises  Long: conitnue to improve stamina Short: Continue to push for more laps on the track. Long: Continue to increase strength and stamina. Short: Add 0.5% incline to treadmill Long: Continue to increase overall MET level    Row Name 04/24/22 1123 04/25/22 1502           Exercise Goal Re-Evaluation   Exercise Goals Review Increase Physical Activity;Increase Strength and Stamina;Understanding of Exercise Prescription Increase Physical Activity;Increase Strength and Stamina;Understanding of Exercise Prescription      Comments Mary Kennedy is nearing graduation.  She improved her post 6MWT by 148 ft!!  She already has a plan in place to continue to exercise after graduation.  She is planning to walk at park when it gets warmer.  But in the mean time, she has been using the recumbent bike in her complex gym.  She feels that she has gotten better and is now able to do more than when she started. Mary Kennedy is doing well in rehab and is close to graduating. She recently increased her  average overall MET level to 2.59 METs. She also improved to level 1.4 on the recumbent elliptical. She increased her hand weights from 3 lb to 5 lb as well. We will continue to monitor her progress until she graduates from the program.      Expected Outcomes Short: Graduate Long: Continue to exercise independently Short: Graduate. Long: Continue to exercise independently.               Discharge Exercise Prescription (Final Exercise Prescription Changes):  Exercise Prescription Changes - 04/25/22 1400       Response to Exercise   Blood Pressure (Admit) 108/60    Blood Pressure (Exit) 112/64    Heart Rate (Admit) 78 bpm    Heart Rate (Exercise) 121 bpm    Heart Rate (Exit) 83 bpm    Rating of Perceived Exertion (Exercise) 14    Symptoms none    Duration Continue with 30 min of aerobic exercise without signs/symptoms of physical distress.    Intensity THRR unchanged      Progression   Progression Continue to progress workloads to maintain intensity without signs/symptoms of physical distress.    Average METs 2.59      Resistance Training   Training Prescription Yes    Weight 5 lb    Reps 10-15      Interval Training   Interval Training No      Recumbant Elliptical   Level 1.4    Minutes 15    METs 1.5      REL-XR   Level 3    Minutes 15    METs 4      Track   Laps 21    Minutes 15    METs 2.14      Home Exercise Plan   Plans to continue exercise at Home (comment)   walking, staff videos   Frequency Add 2 additional days to program exercise sessions.    Initial Home Exercises Provided 03/20/22      Oxygen   Maintain Oxygen Saturation 88% or higher             Nutrition:  Target Goals: Understanding of nutrition guidelines, daily intake  of sodium <1564m, cholesterol <2029m calories 30% from fat and 7% or less from saturated fats, daily to have 5 or more servings of fruits and vegetables.  Education: All About Nutrition: -Group instruction provided  by verbal, written material, interactive activities, discussions, models, and posters to present general guidelines for heart healthy nutrition including fat, fiber, MyPlate, the role of sodium in heart healthy nutrition, utilization of the nutrition label, and utilization of this knowledge for meal planning. Follow up email sent as well. Written material given at graduation. [facility]  Education need identified 02/21/22  Date 04/11/22  Educator [facility] Review Code 1- Verbalizes Understanding       Biometrics:  Pre Biometrics - 02/21/22 1233       Pre Biometrics   Height 5' 5.5" (1.664 m)    Weight 257 lb 1.6 oz (116.6 kg)    Waist Circumference 42 inches    Hip Circumference 52.5 inches    Waist to Hip Ratio 0.8 %    BMI (Calculated) 42.12    Single Leg Stand 7.2 seconds             Post Biometrics - 04/24/22 1121        Post  Biometrics   Height 5' 5.5" (1.664 m)    Weight 250 lb 3.2 oz (113.5 kg)    Waist Circumference 42 inches    Hip Circumference 52.5 inches    Waist to Hip Ratio 0.8 %    BMI (Calculated) 40.99    Single Leg Stand 9.2 seconds             Nutrition Therapy Plan and Nutrition Goals:  Nutrition Therapy & Goals - 02/21/22 1104       Nutrition Therapy   Diet Heart healthy, low Na, CKD stg3-4 MNT, Pulmonary MNT    Drug/Food Interactions Statins/Certain Fruits    Protein (specify units) 85-90g    Fiber 28 grams    Whole Grain Foods 3 servings    Saturated Fats 16 max. grams    Fruits and Vegetables 8 servings/day    Sodium 1.5 grams      Personal Nutrition Goals   Nutrition Goal ST: practice adding in protein to eat meal. Include, but limit high potassium foods. Practice batch cooking and easy to prep meals. LT: meet protein/calorie needs, continue to limit sodium <1.5g/day, follow MyPlate guidelines    Comments [age].o. F admitted to cardiac rehab s/p TAVR. PMHx  includes AFib, chronic combined systolic and diastolic HF, COPD, hypothyroidism, CKD st 3, HLD, anemia, HTN. Relevant medications includes lipitor, biotin, calcium-magnesium-zinc, vit D3, CoQ10, chromium, furosemide, probiotics, magnesium, MVI, K+, zinc, synthoid. Recent relevant labs: BMP: creatinine 1.5, GFR 33, potassium 4.5. TSH: 9.219. CBC: hemoglobin 11. Her food allergies includes persimmons. [place]iann [place]ulse[place] keeping a low salt diet and trying her best to choose heart healthy foods and has done so for a long time. She reports that recently her appetite has been lower which has also caused her to not be interested in cooking. She likes to make PB sandwiches on whole wheat bread, getting food from [employer] like soup and salads, cottage cheese, greek yogurt, as well as lots of fruits/vegetables. KaJuliann [place]ulse[place] liking leafy greens, but reports she can't have them or needs to have a consistent amount with elliquis - discussed how that would be for coumadin/warfarin and not for elliquis; she reports she has been on warfarin previously. Reviewed general  heart healthy eating and discussed CKD stg 3-4 MNT as well as pulmonary MNT. Discussed the importance of meeting calorie and protein needs to maintain lean muscle tissue - encouraged her to have at least one good source of protein at each meal: cottage cheese, nuts/seeds or nut/seed butter, greek yogurt, eggs, beans/lentils, fish, chicken, meat (less than 2-3x/week); if she bakes a potato she can top it with greek yogurt or smash white beans into it, she could add nuts to her cereal in the morning, or have an egg as part of a snack.  She reports that she will sometimes include ensure to help meet her nutrient needs - discussed how that can help, but to be cautious of the potassium in there and to adjust high potassium foods with what her blood work shows as she is already on potassium with her diuretic and it is encouagred to get in potassium rich foods like whole grains,  beans and lentils in a heart healhty diet. Discussed ingredient prepping and batch meal prepping like chili or soup frozen in individual servings for an easy meal. Provided handouts.      Intervention Plan   Intervention Prescribe, educate and counsel regarding individualized specific dietary modifications aiming towards targeted core components such as weight, hypertension, lipid management, diabetes, heart failure and other comorbidities.;Nutrition handout(s) given to patient.    Expected Outcomes Short Term Goal: Understand basic principles of dietary content, such as calories, fat, sodium, cholesterol and nutrients.;Short Term Goal: A plan has been developed with personal nutrition goals set during dietitian appointment.;Long Term Goal: Adherence to prescribed nutrition plan.             Nutrition Assessments:  MEDIFICTS Score Key: ?70 Need to make dietary changes  40-70 Heart Healthy Diet ? 40 Therapeutic Level Cholesterol Diet  Flowsheet Row Cardiac Rehab from 04/25/2022 in [facility] Cardiac and Pulmonary Rehab  Picture Your Plate Total Score on Discharge 79      Picture Your Plate Scores: <06 Unhealthy dietary pattern with much room for improvement. 41-50 Dietary pattern unlikely to meet recommendations for good health and room for improvement. 51-60 More healthful dietary pattern, with some room for improvement.  >60 Healthy dietary pattern, although there may be some specific behaviors that could be improved.    Nutrition Goals Re-Evaluation:  Nutrition Goals Re-Evaluation     Mary Kennedy Name 03/20/22 1118 04/24/22 1133           Goals   Nutrition Goal ST: practice adding in protein to eat meal. Include, but limit high potassium foods. Practice batch cooking and easy to prep meals. LT: meet protein/calorie needs, continue to limit sodium <1.5g/day, follow MyPlate guidelines Short: Conitnue to use batch cooking Long: Continue to work on portion control.      Comment Mary Kennedy is  doing well in rehab.  She is feeling confident in her diet.  She had stir fry last night .  She notes that she has been eating later with staying up later.  She is trying to add in more protein. She has tried the batch cooking to ease meal prep.  She is also working on portion control.  She is watching her sodium more and avoiding adding extra fat to her meals.  She is trying to add in more vegetables to her meals too. Mary Kennedy continues to work on her diet.  She is doin gmore batch cooking this winter with soups and stuff.  She feels confident that she will be able to maintain what  she has been doing for her diet.  She continues to work on portion control.      Expected Outcome Short: Conitnue to use batch cooking Long: Continue to work on portion control. Continue to follow heart healthy basics               Nutrition Goals Discharge (Final Nutrition Goals Re-Evaluation):  Nutrition Goals Re-Evaluation - 04/24/22 1133       Goals   Nutrition Goal Short: Conitnue to use batch cooking Long: Continue to work on portion control.    Comment Mary Kennedy continues to work on her diet.  She is doin gmore batch cooking this winter with soups and stuff.  She feels confident that she will be able to maintain what she has been doing for her diet.  She continues to work on portion control.    Expected Outcome Continue to follow heart healthy basics             Psychosocial: Target Goals: Acknowledge presence or absence of significant depression and/or stress, maximize coping skills, provide positive support system. Participant is able to verbalize types and ability to use techniques and skills needed for reducing stress and depression.   Education: Stress, Anxiety, and Depression - Group verbal and visual presentation to define topics covered.  Reviews how body is impacted by stress, anxiety, and depression.  Also discusses healthy ways to reduce stress and to treat/manage anxiety and depression.  Written  material given at graduation. Flowsheet Row Cardiac Rehab from 05/02/2022 in [facility] Cardiac and Pulmonary Rehab  Date 03/07/22  Educator [facility]  Instruction Review Code 1- [employer] Understanding       Education: Sleep Hygiene -Provides group verbal and written instruction about how sleep can affect your health.  Define sleep hygiene, discuss sleep cycles and impact of sleep habits. Review good sleep hygiene tips.    Initial Review & Psychosocial Screening:  Initial Psych Review & Screening - 02/13/22 1012       Initial Review   Current issues with Current Stress Concerns;Current Sleep Concerns    Source of Stress Concerns Chronic Illness;Unable to participate in former interests or hobbies      [place]? Yes   friends and church group     Barriers   Psychosocial barriers to participate in program There are no identifiable barriers or psychosocial needs.;The patient should benefit from training in stress management and relaxation.      Screening Interventions   Interventions Encouraged to exercise;Provide feedback about the scores to participant;To provide support and resources with identified psychosocial needs    Expected Outcomes Short Term goal: Utilizing psychosocial counselor, staff and physician to assist with identification of specific Stressors or current issues interfering with healing process. Setting desired goal for each stressor or current issue identified.;Long Term Goal: Stressors or current issues are controlled or eliminated.;Short Term goal: Identification and review with participant of any Quality of Life or Depression concerns found by scoring the questionnaire.;Long Term goal: The participant improves quality of Life and PHQ9 Scores as seen by post scores and/or verbalization of changes             Quality of Life Scores:   Quality of Life - 04/25/22 1155       Quality of Life Scores   Health/Function Pre 22.35 %     Health/Function Post 19.29 %    Health/Function % Change -13.69 %    Socioeconomic Pre 24.43 %  Socioeconomic Post 25.57 %    Socioeconomic % Change  4.67 %    Psych/Spiritual Pre 27.21 %    Psych/Spiritual Post 21.5 %    Psych/Spiritual % Change -20.98 %    Family Post 0 %    GLOBAL Pre 24.15 %    GLOBAL Post 21.41 %    GLOBAL % Change -11.35 %            Scores of 19 and below usually indicate a poorer quality of life in these areas.  A difference of  2-3 points is a clinically meaningful difference.  A difference of 2-3 points in the total score of the Quality of Life Index has been associated with significant improvement in overall quality of life, self-image, physical symptoms, and general health in studies assessing change in quality of life.  PHQ-9: Review Flowsheet  More data exists      04/25/2022 04/18/2022 03/20/2022 02/21/2022 07/14/2021  Depression screen PHQ 2/9  Decreased Interest 1 0 1 1 0  Down, Depressed, Hopeless 1 0 1 0 0  PHQ - 2 Score 2 0 2 1 0  Altered sleeping 2 0 2 2 -  Tired, decreased energy _0 -  Change in appetite 1 0 1 1 -  Feeling bad or failure about yourself  0 0 0 0 -  Trouble concentrating 1 0 0 1 -  Moving slowly or fidgety/restless 0 1 0 0 -  Suicidal thoughts 0 0 0 0 -  PHQ-9 Score _1 -  Difficult doing work/chores Somewhat difficult Somewhat difficult Somewhat difficult Somewhat difficult -   Interpretation of Total Score  Total Score Depression Severity:  1-4 = Minimal depression, 5-9 = Mild depression, 10-14 = Moderate depression, 15-19 = Moderately severe depression, 20-27 = Severe depression   Psychosocial Evaluation and Intervention:  Psychosocial Evaluation - 02/13/22 1032       Psychosocial Evaluation & Interventions   Interventions Encouraged to exercise with the program and follow exercise prescription;Stress management education;Relaxation education    Comments 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 She quit her job last fall because of her declining health and trying to figure out what was going on. 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 She has been struggling with lymphedema with the complication of cellulitis this year as well. She prides herself on being independent and works hard to maintain that. She has a great support system of friends and church family. She mentioned that her sleep is a struggle with taking lasix and a lot on her mind. She feels very sluggish and doesn't know if it is related to all that she has been through these last few months or a form of depression. She is very aware of her mental health and has a strong resolve to push through it. She is wanting to gain back her strength by coming here. She is excited to get started.    Expected Outcomes Short: attend cardiac rehab for education and exercise. Long: develop and maintain positive self care habits.    Continue Psychosocial Services  Follow up required by staff             Psychosocial Re-Evaluation:  Psychosocial Re-Evaluation     [place] Name 03/20/22  1111 04/24/22 1128           Psychosocial Re-Evaluation   Current issues with Current Stress Concerns;Current Depression Current Stress Concerns;Current Depression      Comments Mary Kennedy is doing well in rehab.  She completed her PHQ which went up a point.  She is not sure she is depressed but she is constantly feeling tired.  She notes some achiness in her chest and goes to doctor next week.  She leans on her best friend for emotional support to each other.  She is tired of hearing about her friend's significant other when she did not listen to her advice previously.  Mary Kennedy feels like she is sleeping a lot during day.  She lays down when her back hurts or gets tired  and then sleeps hard.  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  She will mention it to her doctor.  She has also noted increased SOB today.  She is finding that she is getting more joy from TV time too.  We talked about exercising while watching TV. Mary Kennedy has done well in rehab.  She has lost some weight. She has had a little scare with numbness in face and hand but is working with her doctor on it.  She is feeling better overall. She has enjoyed getting to routine again. She is no longer as scared to exercise and do what she wants to do.      Expected Outcomes Short: Talk to doctor about fatigue long: Continue to take care of herself Continue to exercise for mental boost      Interventions Encouraged to attend Cardiac Rehabilitation for the exercise Encouraged to attend Cardiac Rehabilitation for the exercise      Continue Psychosocial Services  Follow up required by staff Follow up required by staff               Psychosocial Discharge (Final Psychosocial Re-Evaluation):  Psychosocial Re-Evaluation - 04/24/22 1128       Psychosocial Re-Evaluation   Current issues with Current Stress Concerns;Current Depression    Comments Mary Kennedy has done well in rehab.  She has lost some weight. She has had a little scare with numbness in face and hand but is working with her doctor on it.  She is feeling better overall. She has enjoyed getting to routine again. She is no longer as scared to exercise and do what she wants to do.    Expected Outcomes Continue to exercise for mental boost    Interventions Encouraged to attend Cardiac Rehabilitation for the exercise    Continue Psychosocial Services  Follow up required by staff             Vocational Rehabilitation: Provide vocational rehab assistance to qualifying candidates.   Vocational Rehab Evaluation & Intervention:  Vocational Rehab - 02/13/22 1010       Initial Vocational Rehab  Evaluation & Intervention   Assessment shows need for Vocational Rehabilitation No             Education: Education Goals: Education classes will be provided on a variety of topics geared toward better understanding of heart health and risk factor modification. Participant will state understanding/return demonstration of topics presented as noted by education test scores.  Learning Barriers/Preferences:  Learning Barriers/Preferences - 02/13/22 1007       Learning Barriers/Preferences   Learning Barriers Sight    Learning Preferences Individual Instruction  General Cardiac Education Topics:  AED/CPR: - Group verbal and written instruction with the use of models to demonstrate the basic use of the AED with the basic ABC's of resuscitation.   Anatomy and Cardiac Procedures: - Group verbal and visual presentation and models provide information about basic cardiac anatomy and function. Reviews the testing methods done to diagnose heart disease and the outcomes of the test results. Describes the treatment choices: Medical Management, Angioplasty, or Coronary Bypass Surgery for treating various heart conditions including Myocardial Infarction, Angina, Valve Disease, and Cardiac Arrhythmias.  Written material given at graduation. Flowsheet Row Cardiac Rehab from 05/02/2022 in [facility] Cardiac and Pulmonary Rehab  Education need identified 02/21/22  Date 04/18/22  Educator [place]  Instruction Review Code 1- Verbalizes Understanding       Medication Safety: - Group verbal and visual instruction to review commonly prescribed medications for heart and lung disease. Reviews the medication, class of the drug, and side effects. Includes the steps to properly store meds and maintain the prescription regimen.  Written material given at graduation. Flowsheet Row Cardiac Rehab from 05/02/2022 in [facility] Cardiac and Pulmonary Rehab  Date 04/25/22  Educator SB  Instruction Review Code 1-  Verbalizes Understanding       Intimacy: - Group verbal instruction through game format to discuss how heart and lung disease can affect sexual intimacy. Written material given at graduation.. Flowsheet Row Cardiac Rehab from 05/02/2022 in [facility] Cardiac and Pulmonary Rehab  Date 03/21/22  Educator [facility]  Instruction Review Code 1- Verbalizes Understanding       Know Your Numbers and Heart Failure: - Group verbal and visual instruction to discuss disease risk factors for cardiac and pulmonary disease and treatment options.  Reviews associated critical values for Overweight/Obesity, Hypertension, Cholesterol, and Diabetes.  Discusses basics of heart failure: signs/symptoms and treatments.  Introduces Heart Failure Zone chart for action plan for heart failure.  Written material given at graduation. Flowsheet Row Cardiac Rehab from 05/02/2022 in [facility] Cardiac and Pulmonary Rehab  Date 05/02/22  Educator SB  Instruction Review Code 1- Verbalizes Understanding       Infection Prevention: - Provides verbal and written material to individual with discussion of infection control including proper hand washing and proper equipment cleaning during exercise session. Flowsheet Row Cardiac Rehab from 05/02/2022 in [facility] Cardiac and Pulmonary Rehab  Date 02/21/22  Educator [facility]  Instruction Review Code 1- Verbalizes Understanding       Falls Prevention: - Provides verbal and written material to individual with discussion of falls prevention and safety. Flowsheet Row Cardiac Rehab from 05/02/2022 in [facility] Cardiac and Pulmonary Rehab  Date 02/21/22  Educator [facility]  Instruction Review Code 1- Verbalizes Understanding       Other: -Provides group and verbal instruction on various topics (see comments)   Knowledge Questionnaire Score:  Knowledge Questionnaire Score - 04/25/22 1155       Knowledge Questionnaire Score   Post Score 20/26             Core Components/Risk Factors/Patient  Goals at Admission:  Personal Goals and Risk Factors at Admission - 02/21/22 1235       Core Components/Risk Factors/Patient Goals on Admission    Weight Management Yes;Weight Loss;Obesity    Intervention Weight Management: Develop a combined nutrition and exercise program designed to reach desired caloric intake, while maintaining appropriate intake of nutrient and fiber, sodium and fats, and appropriate energy expenditure required for the weight goal.;Weight Management: Provide education and appropriate resources to  help participant work on and attain dietary goals.;Weight Management/Obesity: Establish reasonable short term and long term weight goals.;Obesity: Provide education and appropriate resources to help participant work on and attain dietary goals.    Admit Weight 257 lb 1.6 oz (116.6 kg)    Goal Weight: Short Term 250 lb (113.4 kg)    Goal Weight: Long Term 240 lb (108.9 kg)    Expected Outcomes Short Term: Continue to assess and modify interventions until short term weight is achieved;Long Term: Adherence to nutrition and physical activity/exercise program aimed toward attainment of established weight goal;Weight Loss: Understanding of general recommendations for a balanced deficit meal plan, which promotes 1-2 lb weight loss per week and includes a negative energy balance of [phone] kcal/d;Understanding recommendations for meals to include 15-35% energy as protein, 25-35% energy from fat, 35-60% energy from carbohydrates, less than 232m of dietary cholesterol, 20-35 gm of total fiber daily;Understanding of distribution of calorie intake throughout the day with the consumption of 4-5 meals/snacks    Heart Failure Yes    Intervention Provide a combined exercise and nutrition program that is supplemented with education, support and counseling about heart failure. Directed toward relieving symptoms such as shortness of breath, decreased exercise tolerance, and extremity edema.    Expected  Outcomes Improve functional capacity of life;Short term: Attendance in program 2-3 days a week with increased exercise capacity. Reported lower sodium intake. Reported increased fruit and vegetable intake. Reports medication compliance.;Short term: Daily weights obtained and reported for increase. Utilizing diuretic protocols set by physician.;Long term: Adoption of self-care skills and reduction of barriers for early signs and symptoms recognition and intervention leading to self-care maintenance.    Hypertension Yes    Intervention Provide education on lifestyle modifcations including regular physical activity/exercise, weight management, moderate sodium restriction and increased consumption of fresh fruit, vegetables, and low fat dairy, alcohol moderation, and smoking cessation.;Monitor prescription use compliance.    Expected Outcomes Short Term: Continued assessment and intervention until BP is < 140/955mHG in hypertensive participants. < 130/8058mG in hypertensive participants with diabetes, heart failure or chronic kidney disease.;Long Term: Maintenance of blood pressure at goal levels.    Lipids Yes    Intervention Provide education and support for participant on nutrition & aerobic/resistive exercise along with prescribed medications to achieve LDL <50m8mDL >40mg62m Expected Outcomes Short Term: Participant states understanding of desired cholesterol values and is compliant with medications prescribed. Participant is following exercise prescription and nutrition guidelines.;Long Term: Cholesterol controlled with medications as prescribed, with individualized exercise RX and with personalized nutrition plan. Value goals: LDL < 50mg,32m > 40 mg.             Education:Diabetes - Individual verbal and written instruction to review signs/symptoms of diabetes, desired ranges of glucose level fasting, after meals and with exercise. Acknowledge that pre and post exercise glucose checks will be  done for 3 sessions at entry of program.   Core Components/Risk Factors/Patient Goals Review:   Goals and Risk Factor Review     Row Name 03/20/22 1115 04/24/22 1134           Core Components/Risk Factors/Patient Goals Review   Personal Goals Review Weight Management/Obesity;Hypertension;Lipids;Heart Failure Weight Management/Obesity;Hypertension;Lipids;Heart Failure      Review Kathy Mary Pulseing well in rehab.  She notes an increase in weight today and increased SOB today.  She is going to take an extra lasix today to see if it helps.  She notes that sometimes she  will forget to take it on rehab days as she waits until she gets home to take it.  She has a follow up next week with her doctor and wants to talk about her low energy levels too. Mary Kennedy has done well in rehab. She has been able to lose some weight since starting.  Her pressures are doing well.  She continues to struggle with SOB and occasional swelling, but will adjust her Lasix as needed.  She had a scare with numbness but is working with her doctor about it.      Expected Outcomes Short: Get weight and SOB back to normal Long: Continue to monitor heart failure Continue to monitor risk factors               Core Components/Risk Factors/Patient Goals at Discharge (Final Review):   Goals and Risk Factor Review - 04/24/22 1134       Core Components/Risk Factors/Patient Goals Review   Personal Goals Review Weight Management/Obesity;Hypertension;Lipids;Heart Failure    Review Mary Kennedy has done well in rehab. She has been able to lose some weight since starting.  Her pressures are doing well.  She continues to struggle with SOB and occasional swelling, but will adjust her Lasix as needed.  She had a scare with numbness but is working with her doctor about it.    Expected Outcomes Continue to monitor risk factors             ITP Comments:  ITP Comments     Row Name 02/13/22 1003 02/21/22 1229 03/14/22 0917 04/11/22 0746  05/02/22 1017   ITP Comments Initial phone call completed. Diagnosis can be found in [facility] 7/24. EP Orientation scheduled for Wednesday 10/11 at 9am. Completed 6MWT and gym orientation. Initial ITP created and sent for review to Dr. Emily Filbert, Medical Director. 30 Day review completed. Medical Director ITP review done, changes made as directed, and signed approval by Medical Director.   NEW TO PROGRAM 30 Day review completed. Medical Director ITP review done, changes made as directed, and signed approval by Medical Director. [place] graduated today from  rehab with 36 sessions completed.  Details of the patient's exercise prescription and what She needs to do in order to continue the prescription and progress were discussed with patient.  Patient was given a copy of prescription and goals.  Patient verbalized understanding.  Bently plans to continue to exercise by walking.            Comments: Discharge ITP

## 2022-05-02 NOTE — Progress Notes (Signed)
Discharge Summary   Mary Kennedy (DOB: [birth year])  Mary Kennedy graduated today from  rehab with 36 sessions completed.  Details of the patient's exercise prescription and what She needs to do in order to continue the prescription and progress were discussed with patient.  Patient was given a copy of prescription and goals.  Patient verbalized understanding.  Mary Kennedy plans to continue to exercise by walking.   [place] Name 02/21/22 1229 04/24/22 1120       6 Minute Walk   Phase Initial Discharge    Distance 790 feet 938 feet    Distance % Change -- 18.7 %    Distance Feet Change -- 148 ft    Walk Time 5.3 minutes 6 minutes    # of Rest Breaks 2  22 sec, 20 sec 0    MPH 1.69 1.77    METS 1.16 1.08    RPE 15 15    Perceived Dyspnea  4 3    VO2 Peak 4.05 3.78    Symptoms Yes (comment) Yes (comment)    Comments SOB, bilateral knee pain 4/10 SOB, bilateral knee pain 3/10    Resting HR 71 bpm 71 bpm    Resting BP 126/74 124/64    Resting Oxygen Saturation  97 % 95 %    Exercise Oxygen Saturation  during 6 min walk 97 % 96 %    Max Ex. HR 109 bpm 102 bpm    Max Ex. BP 186/84 146/74    2 Minute Post BP 164/80 --

## 2022-05-02 NOTE — Progress Notes (Signed)
Daily Session Note  Patient Details  Name: Mary Kennedy MRN: [id number] Date of Birth: 12-10-1940 Referring Provider:   Flowsheet Row Cardiac Rehab from 02/21/2022 in [facility]  Referring Provider Katrine Coho MD       Encounter Date: 05/02/2022  Check In:  Session Check In - 05/02/22 1017       Check-In   Supervising physician immediately available to respond to emergencies See telemetry face sheet for immediately available ER MD    Location [facility]    Staff Present Renita Papa, RN BSN;Noah Tickle, BS, Exercise Physiologist;Joseph Tessie Fass, [place]    Virtual Visit No    Medication changes reported     No    Fall or balance concerns reported    No    Warm-up and Cool-down Performed on first and last piece of equipment    Resistance Training Performed Yes    VAD Patient? No    PAD/SET Patient? No      Pain Assessment   Currently in Pain? No/denies                Social History   Tobacco Use  Smoking Status Former   Types: Cigarettes   Quit date: 05/13/1996   Years since quitting: 25.9  Smokeless Tobacco Never    Goals Met:  Independence with exercise equipment Exercise tolerated well No report of concerns or symptoms today Strength training completed today  Goals Unmet:  Not Applicable  Comments:  Mary Kennedy graduated today from  rehab with 36 sessions completed.  Details of the patient's exercise prescription and what She needs to do in order to continue the prescription and progress were discussed with patient.  Patient was given a copy of prescription and goals.  Patient verbalized understanding.  Para plans to continue to exercise by walking.    Dr. Emily Filbert is Medical Director for [facility].  Dr. Ottie Glazier is Medical Director for [facility].

## 2022-06-05 NOTE — Telephone Encounter (Signed)
ERROR

## 2022-06-26 ENCOUNTER — Other Ambulatory Visit: Payer: Self-pay | Admitting: Family Medicine

## 2022-08-14 ENCOUNTER — Ambulatory Visit (INDEPENDENT_AMBULATORY_CARE_PROVIDER_SITE_OTHER): Payer: Medicare Other | Admitting: Vascular Surgery

## 2022-11-27 IMAGING — MG DIGITAL SCREENING BILAT W/ TOMO W/ CAD
8 series · 8 of 24 positions shown · non-contrast
Comparison: Previous exam(s).

CLINICAL DATA: Screening.

EXAM:
DIGITAL SCREENING BILATERAL MAMMOGRAM WITH TOMO AND CAD

[L MLO synth-2D]
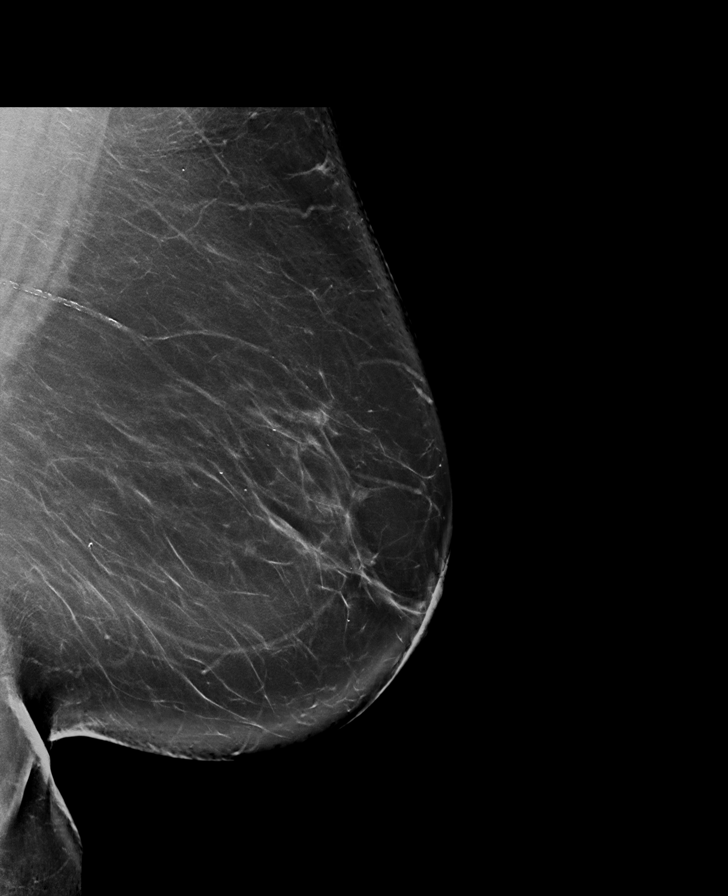

[R MLO synth-2D]
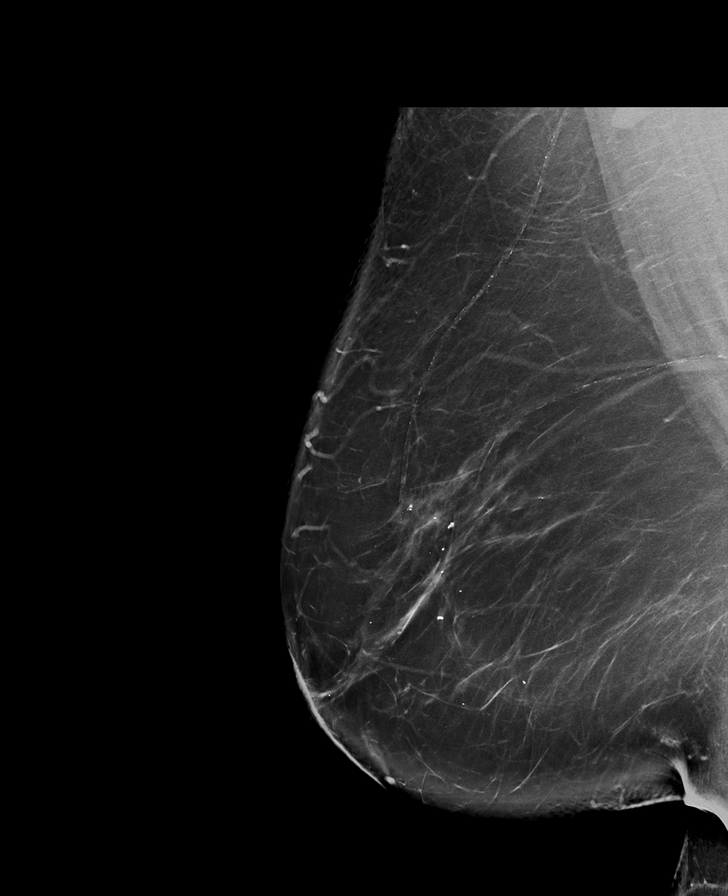

[L CC synth-2D]
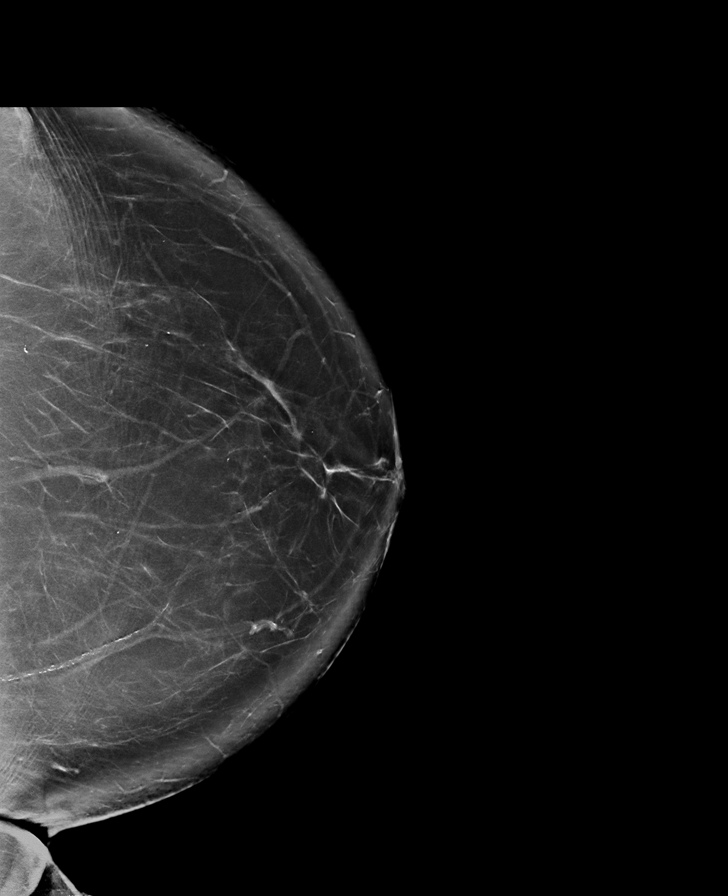

[R CC synth-2D]
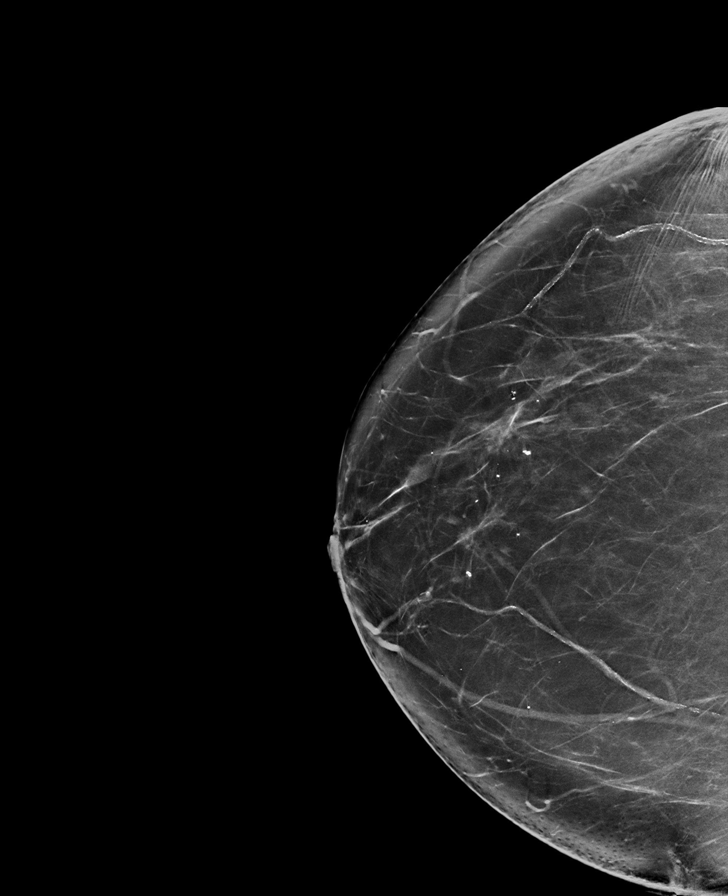

[R MLO tomo · tomo slice 55/108.0]
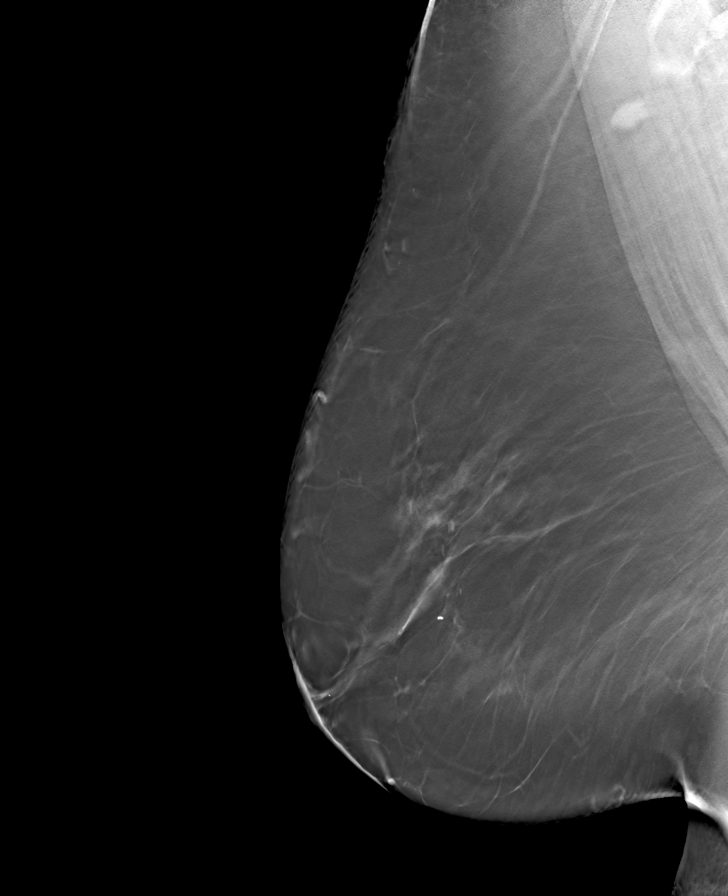

[L MLO tomo · tomo slice 53/106.0]
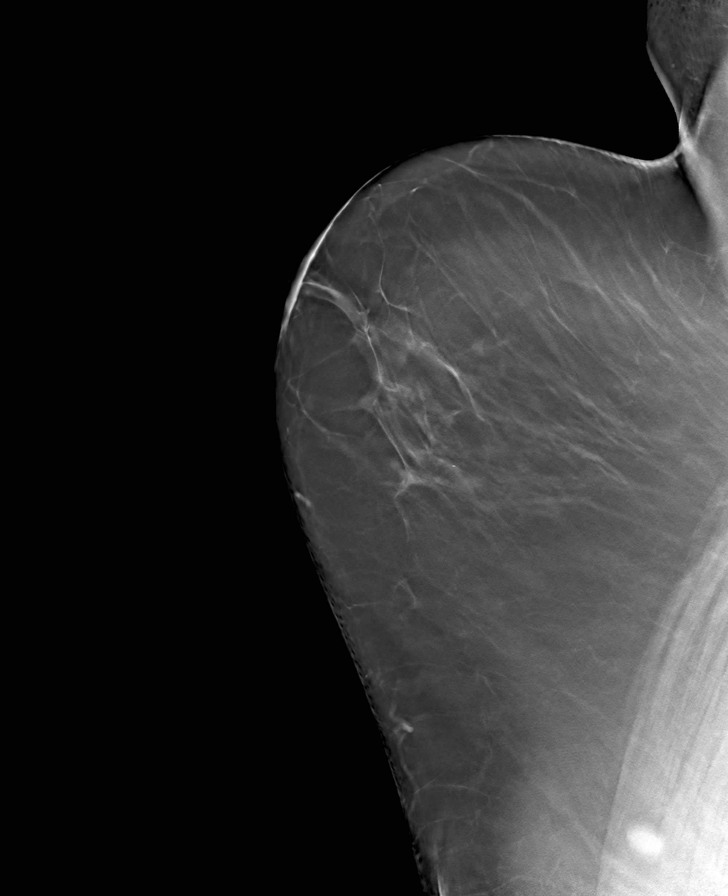

[R CC tomo · tomo slice 45/90.0]
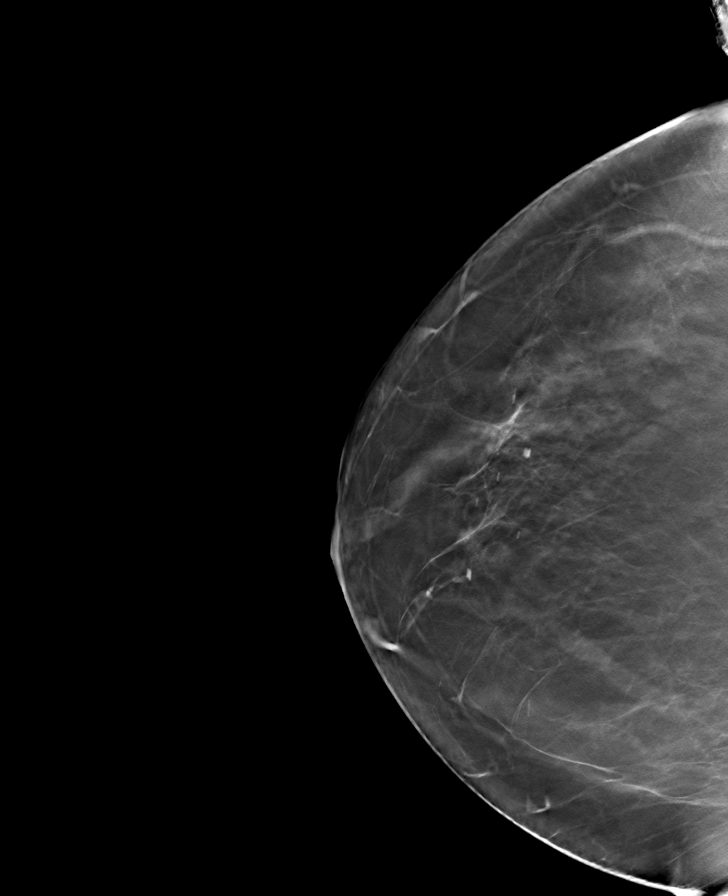

[L CC tomo · tomo slice 51/101.0]
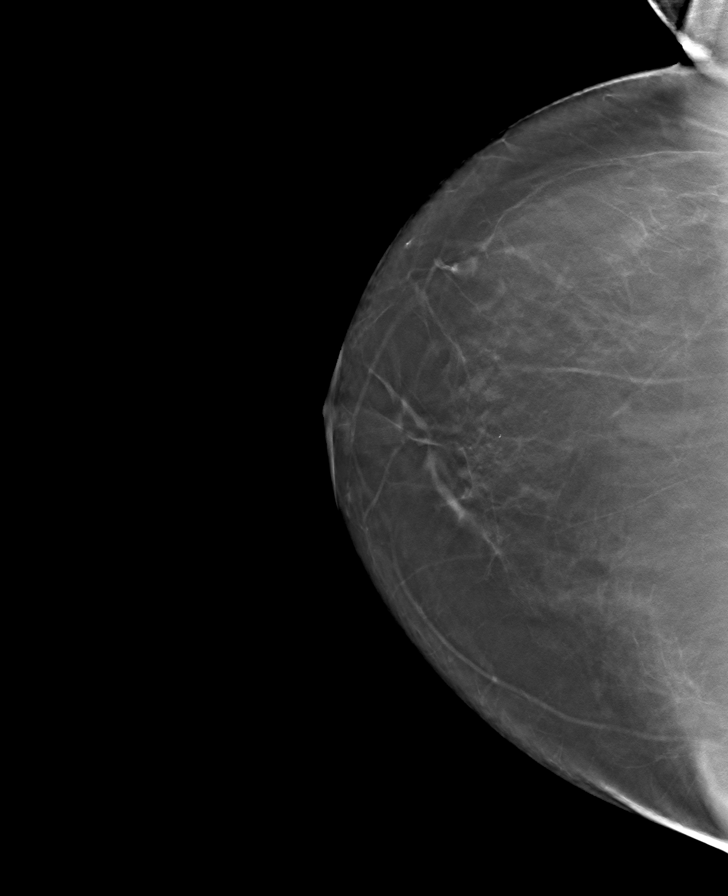

[8 of 24 positions shown; findings below may reference images not displayed]

ACR Breast Density Category b: There are scattered areas of
fibroglandular density.
FINDINGS: There are no findings suspicious for malignancy. Images were
processed with CAD.
IMPRESSION: No mammographic evidence of malignancy. A result letter of this
screening mammogram will be mailed directly to the patient.

RECOMMENDATION:
Screening mammogram in one year. (Code:[id number])

BI-RADS CATEGORY  1: Negative.

## 2024-03-23 ENCOUNTER — Other Ambulatory Visit: Payer: Self-pay | Admitting: Infectious Diseases

## 2024-03-23 DIAGNOSIS — N1831 Chronic kidney disease, stage 3a: Secondary | ICD-10-CM

## 2024-03-24 ENCOUNTER — Ambulatory Visit
Admission: RE | Admit: 2024-03-24 | Discharge: 2024-03-24 | Disposition: A | Discharge status: Home / Self Care | Payer: Medicare (Managed Care) | Source: Ambulatory Visit | Attending: Infectious Diseases | Admitting: Infectious Diseases

## 2024-03-24 DIAGNOSIS — N1831 Chronic kidney disease, stage 3a: Secondary | ICD-10-CM | POA: Insufficient documentation

## 2024-03-30 ENCOUNTER — Other Ambulatory Visit: Payer: Self-pay | Admitting: Infectious Diseases

## 2024-03-30 DIAGNOSIS — N2889 Other specified disorders of kidney and ureter: Secondary | ICD-10-CM

## 2024-04-13 ENCOUNTER — Ambulatory Visit: Payer: Medicare (Managed Care) | Admitting: Urology

## 2024-04-24 ENCOUNTER — Ambulatory Visit: Payer: Medicare (Managed Care) | Admitting: Urology

## 2024-05-06 NOTE — Progress Notes (Signed)
 Patient arrived at [facility] for MRI. Pacemaker was placed in MRI-safe mode by MRI Technologist/[employer] representative. Heart rate, ECG, blood pressure and pulse oximetry monitored Q5 minutes for the duration of the scan (see flowsheet). Patient's scan completed and device set to original mode via MRI Technologist/[employer] representative. Patient ambulated out of department.

## 2024-05-08 DIAGNOSIS — N281 Cyst of kidney, acquired: Secondary | ICD-10-CM | POA: Insufficient documentation

## 2024-05-08 NOTE — Assessment & Plan Note (Addendum)
 4.3 cm complex Left renal cyst (dx Nov 2025)  5.1 cm simple left renal cyst (via MRI report, [facility], Dec 2025)  Significant comorbidity w/ cardiopulmonary disease   Reviewed her recent ultrasound imaging which did reveal a complex left lower pole renal cyst.  I also reviewed her outside report from [facility] radiology, MRI from 05/06/2024.  I explained the interpretation of this report although I am not able to personally review these MRI images.  I trust the outside institution and reporting-MRI characterized a simple 5 cm left lower pole cyst.  With this information I do not think she requires aggressive workup or routine surveillance, particularly in light of her overall comorbidity and age.  - Follow-up on as-needed basis.  She does not require aggressive for routine imaging for her simple cyst

## 2024-05-08 NOTE — Progress Notes (Signed)
 "  05/15/2024 10:21 AM   Mary Kennedy [birth year] [id number]   HPI: 83 y.o. female here for initial evaluation of a Left renal mass  RBUS (03/26/24, for CKD workup) - 4.3 cm left lower pole complex septated cyst MRI abdomen (05/06/2024, via [facility] radiology, report in Care Everywhere) -5.1 cm left lower pole simple renal cyst, otherwise benign report no concerning renal lesions  Hx of mitral valve disease, CKD stage 3, COPD, aortic valve replacement, cardiac pacemaker, hx of prior MI On Eliquis  2.5mg     PMH: Past Medical History:  Diagnosis Date   Allergy    Aortic stenosis    Atrial fibrillation (HCC)    Atypical squamous cells of undetermined significance on cytologic smear of vagina (ASC-US ) 12/01/2014   s/p gynecology consultation 07/2010; s/p repeat pap smear with HPV.    Cellulitis    Heart murmur    Hyperlipidemia    Hypertension    Hypothyroidism    Thyroid  disease    Wears dentures    partial lower, full upper    Surgical History: Past Surgical History:  Procedure Laterality Date   ABDOMINAL HYSTERECTOMY  1961   BIL Oophorectomy   APPENDECTOMY  at age 82   BILATERAL CARPAL TUNNEL RELEASE  1999   CARDIAC CATHETERIZATION     CARDIOVERSION N/A 01/18/2021   Procedure: CARDIOVERSION;  Surgeon: Florencio Cara BIRCH, MD;  Location: [facility];  Service: Cardiovascular;  Laterality: N/A;   CARDIOVERSION N/A 03/23/2021   Procedure: CARDIOVERSION;  Surgeon: Hester Wolm PARAS, MD;  Location: [facility];  Service: Cardiovascular;  Laterality: N/A;   CATARACT EXTRACTION W/PHACO Left 09/01/2019   Procedure: CATARACT EXTRACTION PHACO AND INTRAOCULAR LENS PLACEMENT (IOC) LEFT 8.84 00:49.4;  Surgeon: Jaye Fallow, MD;  Location: [facility];  Service: Ophthalmology;  Laterality: Left;   CATARACT EXTRACTION W/PHACO Right 12/22/2019   Procedure: CATARACT EXTRACTION PHACO AND INTRAOCULAR LENS PLACEMENT (IOC) RIGHT 6.77 00:42.9;  Surgeon: Jaye Fallow, MD;  Location:  [facility];  Service: Ophthalmology;  Laterality: Right;   COLONOSCOPY WITH PROPOFOL  N/A 08/27/2017   Procedure: COLONOSCOPY WITH PROPOFOL ;  Surgeon: Jinny Carmine, MD;  Location: [facility];  Service: Endoscopy;  Laterality: N/A;   FOOT SURGERY Bilateral    RIGHT/LEFT HEART CATH AND CORONARY ANGIOGRAPHY N/A 04/14/2021   Procedure: RIGHT/LEFT HEART CATH AND CORONARY ANGIOGRAPHY;  Surgeon: Florencio Cara BIRCH, MD;  Location: [facility];  Service: Cardiovascular;  Laterality: N/A;   TEE WITHOUT CARDIOVERSION N/A 03/23/2021   Procedure: TRANSESOPHAGEAL ECHOCARDIOGRAM (TEE);  Surgeon: Hester Wolm PARAS, MD;  Location: [facility];  Service: Cardiovascular;  Laterality: N/A;   TEMPORARY PACEMAKER N/A 12/09/2021   Procedure: TEMPORARY PACEMAKER;  Surgeon: Ammon Blunt, MD;  Location: [facility];  Service: Cardiovascular;  Laterality: N/A;   TONSILLECTOMY  at age 28    Family History: Family History  Problem Relation Age of Onset   Pancreatitis Mother    Drug abuse Brother        PTSD   Cancer Brother        pancreas lesion   Colon cancer Brother    Heart attack Father    Lung cancer Brother    Cancer Brother        lung cancer   Breast cancer Cousin        2nd cousin    Social History:  reports that she quit smoking about 28 years ago. Her smoking use included cigarettes. She has never used smokeless tobacco. She reports that  she does not currently use alcohol. She reports that she does not use drugs.      Physical Exam: There were no vitals taken for this visit.   Constitutional:  Alert and oriented, No acute distress. Cardiovascular: No clubbing, cyanosis, or edema. Respiratory: Normal respiratory effort, no increased work of breathing. GI: Nondistended Skin: No rashes, bruises or suspicious lesions. Neurologic: Grossly intact, no focal deficits, moving all 4 extremities. Psychiatric: Normal mood and affect.  Laboratory Data:  Latest Reference  Range & Units 07/06/21 02:59 07/07/21 06:24 07/08/21 05:48 09/25/21 10:59 12/09/21 12:24 12/09/21 20:49  Creatinine 0.44 - 1.00 mg/dL 7.80 (H) 7.84 (H) 7.65 (H) 1.22 (H) 1.34 (H) 1.54 (H)  (H): Data is abnormally high   Pertinent Imaging: I have personally viewed and interpreted the RBUS (Nov 2025) - agree with read.  Appears to have complex 4 cm left lower pole renal cyst with mural thickening and possible internal septation.   MRI abdomen (05/06/2024, via [facility] radiology, report in Care Everywhere) -5.1 cm left lower pole simple renal cyst, otherwise benign report no concerning renal lesions    Assessment & Plan:    Renal cyst Assessment & Plan: 4.3 cm complex Left renal cyst (dx Nov 2025)  5.1 cm simple left renal cyst (via MRI report, [facility], Dec 2025)  Significant comorbidity w/ cardiopulmonary disease   Reviewed her recent ultrasound imaging which did reveal a complex left lower pole renal cyst.  I also reviewed her outside report from [facility] radiology, MRI from 05/06/2024.  I explained the interpretation of this report although I am not able to personally review these MRI images.  I trust the outside institution and reporting-MRI characterized a simple 5 cm left lower pole cyst.  With this information I do not think she requires aggressive workup or routine surveillance, particularly in light of her overall comorbidity and age.  - Follow-up on as-needed basis.  She does not require aggressive for routine imaging for her simple cyst       Penne Skye, MD 05/15/2024  [facility] [address] [place], [place] [address] [phone] "

## 2024-05-15 ENCOUNTER — Ambulatory Visit: Payer: Medicare (Managed Care) | Admitting: Urology

## 2024-05-15 ENCOUNTER — Encounter: Payer: Self-pay | Admitting: Urology

## 2024-05-15 DIAGNOSIS — N281 Cyst of kidney, acquired: Secondary | ICD-10-CM
